# Patient Record
Sex: Female | Born: 1945
Health system: Southern US, Community
[De-identification: ages and names within clinical notes are randomized; demographics above are authoritative.]

## PROBLEM LIST (undated history)

## (undated) DIAGNOSIS — R42 Dizziness and giddiness: Secondary | ICD-10-CM

## (undated) DIAGNOSIS — G4730 Sleep apnea, unspecified: Secondary | ICD-10-CM

## (undated) DIAGNOSIS — Z9989 Dependence on other enabling machines and devices: Secondary | ICD-10-CM

## (undated) DIAGNOSIS — F419 Anxiety disorder, unspecified: Secondary | ICD-10-CM

## (undated) DIAGNOSIS — T7840XA Allergy, unspecified, initial encounter: Secondary | ICD-10-CM

## (undated) DIAGNOSIS — A6000 Herpesviral infection of urogenital system, unspecified: Secondary | ICD-10-CM

## (undated) DIAGNOSIS — N6009 Solitary cyst of unspecified breast: Secondary | ICD-10-CM

## (undated) DIAGNOSIS — R519 Headache, unspecified: Secondary | ICD-10-CM

## (undated) DIAGNOSIS — I10 Essential (primary) hypertension: Secondary | ICD-10-CM

## (undated) DIAGNOSIS — E785 Hyperlipidemia, unspecified: Secondary | ICD-10-CM

## (undated) DIAGNOSIS — C4491 Basal cell carcinoma of skin, unspecified: Secondary | ICD-10-CM

## (undated) DIAGNOSIS — K219 Gastro-esophageal reflux disease without esophagitis: Secondary | ICD-10-CM

## (undated) DIAGNOSIS — H269 Unspecified cataract: Secondary | ICD-10-CM

## (undated) DIAGNOSIS — G4733 Obstructive sleep apnea (adult) (pediatric): Secondary | ICD-10-CM

## (undated) DIAGNOSIS — N200 Calculus of kidney: Secondary | ICD-10-CM

## (undated) DIAGNOSIS — M1990 Unspecified osteoarthritis, unspecified site: Secondary | ICD-10-CM

## (undated) DIAGNOSIS — N393 Stress incontinence (female) (male): Secondary | ICD-10-CM

## (undated) HISTORY — DX: Obstructive sleep apnea (adult) (pediatric): G47.33

## (undated) HISTORY — DX: Dependence on other enabling machines and devices: Z99.89

## (undated) HISTORY — DX: Calculus of kidney: N20.0

## (undated) HISTORY — DX: Allergy, unspecified, initial encounter: T78.40XA

## (undated) HISTORY — DX: Essential (primary) hypertension: I10

## (undated) HISTORY — DX: Sleep apnea, unspecified: G47.30

## (undated) HISTORY — PX: OTHER SURGICAL HISTORY: SHX169

## (undated) HISTORY — PX: COLONOSCOPY: SHX174

## (undated) HISTORY — PX: WISDOM TOOTH EXTRACTION: SHX21

## (undated) HISTORY — DX: Hyperlipidemia, unspecified: E78.5

## (undated) HISTORY — DX: Stress incontinence (female) (male): N39.3

## (undated) HISTORY — DX: Unspecified cataract: H26.9

## (undated) HISTORY — DX: Basal cell carcinoma of skin, unspecified: C44.91

## (undated) HISTORY — DX: Solitary cyst of unspecified breast: N60.09

## (undated) HISTORY — DX: Anxiety disorder, unspecified: F41.9

## (undated) HISTORY — DX: Herpesviral infection of urogenital system, unspecified: A60.00

## (undated) HISTORY — DX: Unspecified osteoarthritis, unspecified site: M19.90

## (undated) HISTORY — PX: KNEE ARTHROSCOPY: SUR90

## (undated) HISTORY — DX: Dizziness and giddiness: R42

## (undated) HISTORY — PX: LITHOTRIPSY: SUR834

## (undated) HISTORY — PX: RECTOVAGINAL FISTULA CLOSURE: SUR265

## (undated) HISTORY — DX: Gastro-esophageal reflux disease without esophagitis: K21.9

## (undated) HISTORY — PX: ESOPHAGOGASTRODUODENOSCOPY: SHX1529

---

## 1984-12-27 HISTORY — PX: ABDOMINAL HYSTERECTOMY: SHX81

## 1999-05-03 ENCOUNTER — Emergency Department (HOSPITAL_COMMUNITY): Admission: EM | Admit: 1999-05-03 | Discharge: 1999-05-03 | Payer: Self-pay | Admitting: Emergency Medicine

## 1999-06-25 ENCOUNTER — Ambulatory Visit (HOSPITAL_COMMUNITY): Admission: RE | Admit: 1999-06-25 | Discharge: 1999-06-25 | Payer: Self-pay | Admitting: Cardiology

## 1999-07-31 ENCOUNTER — Encounter: Payer: Self-pay | Admitting: Emergency Medicine

## 1999-07-31 ENCOUNTER — Emergency Department (HOSPITAL_COMMUNITY): Admission: EM | Admit: 1999-07-31 | Discharge: 1999-07-31 | Payer: Self-pay | Admitting: Emergency Medicine

## 1999-08-17 ENCOUNTER — Emergency Department (HOSPITAL_COMMUNITY): Admission: EM | Admit: 1999-08-17 | Discharge: 1999-08-17 | Payer: Self-pay | Admitting: Emergency Medicine

## 2000-03-28 ENCOUNTER — Other Ambulatory Visit: Admission: RE | Admit: 2000-03-28 | Discharge: 2000-03-28 | Payer: Self-pay | Admitting: Obstetrics and Gynecology

## 2000-06-20 ENCOUNTER — Other Ambulatory Visit: Admission: RE | Admit: 2000-06-20 | Discharge: 2000-06-20 | Payer: Self-pay | Admitting: Gynecology

## 2000-07-18 ENCOUNTER — Ambulatory Visit (HOSPITAL_COMMUNITY): Admission: RE | Admit: 2000-07-18 | Discharge: 2000-07-18 | Payer: Self-pay | Admitting: Internal Medicine

## 2000-07-18 ENCOUNTER — Encounter (INDEPENDENT_AMBULATORY_CARE_PROVIDER_SITE_OTHER): Payer: Self-pay

## 2000-08-26 ENCOUNTER — Encounter: Payer: Self-pay | Admitting: Family Medicine

## 2000-08-26 ENCOUNTER — Emergency Department (HOSPITAL_COMMUNITY): Admission: EM | Admit: 2000-08-26 | Discharge: 2000-08-26 | Payer: Self-pay | Admitting: Emergency Medicine

## 2001-02-10 ENCOUNTER — Ambulatory Visit (HOSPITAL_COMMUNITY): Admission: RE | Admit: 2001-02-10 | Discharge: 2001-02-10 | Payer: Self-pay | Admitting: Orthopedic Surgery

## 2001-02-10 ENCOUNTER — Encounter: Payer: Self-pay | Admitting: Orthopedic Surgery

## 2001-03-22 ENCOUNTER — Encounter: Payer: Self-pay | Admitting: Cardiology

## 2001-03-22 ENCOUNTER — Ambulatory Visit (HOSPITAL_COMMUNITY): Admission: RE | Admit: 2001-03-22 | Discharge: 2001-03-22 | Payer: Self-pay | Admitting: Cardiology

## 2001-06-15 ENCOUNTER — Encounter: Payer: Self-pay | Admitting: Orthopedic Surgery

## 2001-06-15 ENCOUNTER — Ambulatory Visit (HOSPITAL_COMMUNITY): Admission: RE | Admit: 2001-06-15 | Discharge: 2001-06-15 | Payer: Self-pay | Admitting: Orthopedic Surgery

## 2001-10-31 ENCOUNTER — Other Ambulatory Visit: Admission: RE | Admit: 2001-10-31 | Discharge: 2001-10-31 | Payer: Self-pay | Admitting: Obstetrics and Gynecology

## 2001-11-02 ENCOUNTER — Emergency Department (HOSPITAL_COMMUNITY): Admission: EM | Admit: 2001-11-02 | Discharge: 2001-11-03 | Payer: Self-pay | Admitting: Emergency Medicine

## 2001-11-04 ENCOUNTER — Emergency Department (HOSPITAL_COMMUNITY): Admission: EM | Admit: 2001-11-04 | Discharge: 2001-11-04 | Payer: Self-pay | Admitting: Emergency Medicine

## 2001-11-04 ENCOUNTER — Encounter: Payer: Self-pay | Admitting: Emergency Medicine

## 2001-11-09 ENCOUNTER — Encounter: Admission: RE | Admit: 2001-11-09 | Discharge: 2002-01-02 | Payer: Self-pay | Admitting: Orthopedic Surgery

## 2001-12-27 HISTORY — PX: CHOLECYSTECTOMY: SHX55

## 2002-01-12 ENCOUNTER — Encounter: Admission: RE | Admit: 2002-01-12 | Discharge: 2002-01-30 | Payer: Self-pay | Admitting: Orthopedic Surgery

## 2002-01-24 ENCOUNTER — Encounter: Admission: RE | Admit: 2002-01-24 | Discharge: 2002-01-24 | Payer: Self-pay | Admitting: Family Medicine

## 2002-01-24 ENCOUNTER — Encounter: Payer: Self-pay | Admitting: Family Medicine

## 2002-05-29 ENCOUNTER — Encounter: Payer: Self-pay | Admitting: Emergency Medicine

## 2002-05-29 ENCOUNTER — Emergency Department (HOSPITAL_COMMUNITY): Admission: EM | Admit: 2002-05-29 | Discharge: 2002-05-29 | Payer: Self-pay

## 2002-08-02 ENCOUNTER — Encounter: Payer: Self-pay | Admitting: General Surgery

## 2002-08-02 ENCOUNTER — Observation Stay (HOSPITAL_COMMUNITY): Admission: RE | Admit: 2002-08-02 | Discharge: 2002-08-03 | Payer: Self-pay | Admitting: General Surgery

## 2002-08-02 ENCOUNTER — Encounter (INDEPENDENT_AMBULATORY_CARE_PROVIDER_SITE_OTHER): Payer: Self-pay | Admitting: Specialist

## 2002-11-07 ENCOUNTER — Other Ambulatory Visit: Admission: RE | Admit: 2002-11-07 | Discharge: 2002-11-07 | Payer: Self-pay | Admitting: Obstetrics and Gynecology

## 2002-11-08 ENCOUNTER — Encounter: Payer: Self-pay | Admitting: Internal Medicine

## 2002-12-01 ENCOUNTER — Emergency Department (HOSPITAL_COMMUNITY): Admission: EM | Admit: 2002-12-01 | Discharge: 2002-12-01 | Payer: Self-pay | Admitting: *Deleted

## 2002-12-10 ENCOUNTER — Encounter: Admission: RE | Admit: 2002-12-10 | Discharge: 2003-03-10 | Payer: Self-pay | Admitting: Family Medicine

## 2003-03-10 ENCOUNTER — Encounter: Payer: Self-pay | Admitting: Orthopedic Surgery

## 2003-03-10 ENCOUNTER — Ambulatory Visit (HOSPITAL_COMMUNITY): Admission: RE | Admit: 2003-03-10 | Discharge: 2003-03-10 | Payer: Self-pay | Admitting: Orthopedic Surgery

## 2003-05-14 ENCOUNTER — Encounter: Admission: RE | Admit: 2003-05-14 | Discharge: 2003-08-12 | Payer: Self-pay | Admitting: Family Medicine

## 2003-07-19 ENCOUNTER — Emergency Department (HOSPITAL_COMMUNITY): Admission: EM | Admit: 2003-07-19 | Discharge: 2003-07-19 | Payer: Self-pay

## 2003-10-18 ENCOUNTER — Ambulatory Visit (HOSPITAL_COMMUNITY): Admission: RE | Admit: 2003-10-18 | Discharge: 2003-10-18 | Payer: Self-pay | Admitting: Cardiology

## 2003-10-18 ENCOUNTER — Encounter: Payer: Self-pay | Admitting: Cardiology

## 2004-01-08 ENCOUNTER — Encounter: Admission: RE | Admit: 2004-01-08 | Discharge: 2004-04-07 | Payer: Self-pay | Admitting: Neurology

## 2004-12-25 ENCOUNTER — Ambulatory Visit: Payer: Self-pay | Admitting: Internal Medicine

## 2005-03-10 ENCOUNTER — Other Ambulatory Visit: Admission: RE | Admit: 2005-03-10 | Discharge: 2005-03-10 | Payer: Self-pay | Admitting: Addiction Medicine

## 2005-06-08 ENCOUNTER — Ambulatory Visit (HOSPITAL_BASED_OUTPATIENT_CLINIC_OR_DEPARTMENT_OTHER): Admission: RE | Admit: 2005-06-08 | Discharge: 2005-06-08 | Payer: Self-pay | Admitting: Family Medicine

## 2005-06-08 ENCOUNTER — Ambulatory Visit: Payer: Self-pay | Admitting: Internal Medicine

## 2006-02-24 ENCOUNTER — Encounter: Admission: RE | Admit: 2006-02-24 | Discharge: 2006-02-24 | Payer: Self-pay | Admitting: Orthopedic Surgery

## 2006-04-15 ENCOUNTER — Encounter: Admission: RE | Admit: 2006-04-15 | Discharge: 2006-05-16 | Payer: Self-pay | Admitting: Orthopedic Surgery

## 2006-05-17 ENCOUNTER — Encounter: Admission: RE | Admit: 2006-05-17 | Discharge: 2006-08-15 | Payer: Self-pay | Admitting: Orthopedic Surgery

## 2006-10-03 ENCOUNTER — Encounter: Admission: RE | Admit: 2006-10-03 | Discharge: 2006-11-02 | Payer: Self-pay | Admitting: Orthopedic Surgery

## 2006-12-05 ENCOUNTER — Encounter: Admission: RE | Admit: 2006-12-05 | Discharge: 2006-12-05 | Payer: Self-pay | Admitting: Orthopedic Surgery

## 2006-12-13 ENCOUNTER — Encounter: Admission: RE | Admit: 2006-12-13 | Discharge: 2006-12-13 | Payer: Self-pay | Admitting: Orthopedic Surgery

## 2007-01-11 ENCOUNTER — Encounter: Admission: RE | Admit: 2007-01-11 | Discharge: 2007-03-30 | Payer: Self-pay | Admitting: Orthopedic Surgery

## 2007-04-11 ENCOUNTER — Encounter (INDEPENDENT_AMBULATORY_CARE_PROVIDER_SITE_OTHER): Payer: Self-pay | Admitting: *Deleted

## 2007-04-11 ENCOUNTER — Emergency Department (HOSPITAL_COMMUNITY): Admission: EM | Admit: 2007-04-11 | Discharge: 2007-04-11 | Payer: Self-pay | Admitting: Emergency Medicine

## 2007-05-25 ENCOUNTER — Other Ambulatory Visit: Admission: RE | Admit: 2007-05-25 | Discharge: 2007-05-25 | Payer: Self-pay | Admitting: Obstetrics and Gynecology

## 2007-05-29 ENCOUNTER — Ambulatory Visit (HOSPITAL_COMMUNITY): Admission: RE | Admit: 2007-05-29 | Discharge: 2007-05-29 | Payer: Self-pay | Admitting: Urology

## 2007-05-29 ENCOUNTER — Encounter (INDEPENDENT_AMBULATORY_CARE_PROVIDER_SITE_OTHER): Payer: Self-pay | Admitting: *Deleted

## 2008-03-08 ENCOUNTER — Ambulatory Visit: Payer: Self-pay | Admitting: Internal Medicine

## 2008-03-15 ENCOUNTER — Encounter: Payer: Self-pay | Admitting: Internal Medicine

## 2008-03-15 ENCOUNTER — Ambulatory Visit: Payer: Self-pay | Admitting: Internal Medicine

## 2008-08-07 ENCOUNTER — Encounter: Payer: Self-pay | Admitting: Internal Medicine

## 2008-11-11 ENCOUNTER — Telehealth: Payer: Self-pay | Admitting: Internal Medicine

## 2008-12-25 DIAGNOSIS — F329 Major depressive disorder, single episode, unspecified: Secondary | ICD-10-CM | POA: Insufficient documentation

## 2008-12-25 DIAGNOSIS — K589 Irritable bowel syndrome without diarrhea: Secondary | ICD-10-CM | POA: Insufficient documentation

## 2008-12-25 DIAGNOSIS — K449 Diaphragmatic hernia without obstruction or gangrene: Secondary | ICD-10-CM | POA: Insufficient documentation

## 2008-12-25 DIAGNOSIS — K219 Gastro-esophageal reflux disease without esophagitis: Secondary | ICD-10-CM | POA: Insufficient documentation

## 2008-12-25 DIAGNOSIS — N824 Other female intestinal-genital tract fistulae: Secondary | ICD-10-CM | POA: Insufficient documentation

## 2008-12-25 DIAGNOSIS — K5730 Diverticulosis of large intestine without perforation or abscess without bleeding: Secondary | ICD-10-CM | POA: Insufficient documentation

## 2008-12-25 DIAGNOSIS — F411 Generalized anxiety disorder: Secondary | ICD-10-CM | POA: Insufficient documentation

## 2008-12-25 DIAGNOSIS — Z8679 Personal history of other diseases of the circulatory system: Secondary | ICD-10-CM | POA: Insufficient documentation

## 2008-12-25 DIAGNOSIS — E785 Hyperlipidemia, unspecified: Secondary | ICD-10-CM | POA: Insufficient documentation

## 2008-12-30 ENCOUNTER — Ambulatory Visit: Payer: Self-pay | Admitting: Internal Medicine

## 2009-06-09 ENCOUNTER — Other Ambulatory Visit: Admission: RE | Admit: 2009-06-09 | Discharge: 2009-06-09 | Payer: Self-pay | Admitting: Obstetrics and Gynecology

## 2009-06-09 ENCOUNTER — Encounter: Payer: Self-pay | Admitting: Obstetrics and Gynecology

## 2009-06-09 ENCOUNTER — Ambulatory Visit: Payer: Self-pay | Admitting: Obstetrics and Gynecology

## 2009-07-09 ENCOUNTER — Encounter: Payer: Self-pay | Admitting: Internal Medicine

## 2009-08-31 ENCOUNTER — Emergency Department (HOSPITAL_COMMUNITY): Admission: EM | Admit: 2009-08-31 | Discharge: 2009-08-31 | Payer: Self-pay | Admitting: Emergency Medicine

## 2009-09-04 ENCOUNTER — Telehealth: Payer: Self-pay | Admitting: Internal Medicine

## 2009-09-04 ENCOUNTER — Ambulatory Visit: Payer: Self-pay | Admitting: Internal Medicine

## 2009-09-04 DIAGNOSIS — R1032 Left lower quadrant pain: Secondary | ICD-10-CM | POA: Insufficient documentation

## 2009-09-04 DIAGNOSIS — A09 Infectious gastroenteritis and colitis, unspecified: Secondary | ICD-10-CM | POA: Insufficient documentation

## 2009-09-04 DIAGNOSIS — K921 Melena: Secondary | ICD-10-CM | POA: Insufficient documentation

## 2009-09-04 DIAGNOSIS — R195 Other fecal abnormalities: Secondary | ICD-10-CM | POA: Insufficient documentation

## 2009-09-05 LAB — CONVERTED CEMR LAB
BUN: 14 mg/dL (ref 6–23)
Calcium: 8.7 mg/dL (ref 8.4–10.5)
Chloride: 107 meq/L (ref 96–112)
Eosinophils Relative: 0.7 % (ref 0.0–5.0)
GFR calc non Af Amer: 92.96 mL/min (ref >60)
HCT: 40.6 % (ref 36.0–46.0)
Hemoglobin: 13.4 g/dL (ref 12.0–15.0)
Lymphocytes Relative: 17.8 % (ref 12.0–46.0)
MCV: 96.6 fL (ref 78.0–100.0)
Monocytes Absolute: 0.3 10*3/uL (ref 0.1–1.0)
Potassium: 3.8 meq/L (ref 3.5–5.1)

## 2009-09-08 ENCOUNTER — Encounter: Payer: Self-pay | Admitting: Physician Assistant

## 2009-09-17 ENCOUNTER — Ambulatory Visit: Payer: Self-pay | Admitting: Internal Medicine

## 2009-09-29 ENCOUNTER — Encounter: Payer: Self-pay | Admitting: Internal Medicine

## 2009-10-23 ENCOUNTER — Encounter: Payer: Self-pay | Admitting: Internal Medicine

## 2010-02-23 ENCOUNTER — Telehealth: Payer: Self-pay | Admitting: Internal Medicine

## 2010-02-23 ENCOUNTER — Ambulatory Visit: Payer: Self-pay | Admitting: Internal Medicine

## 2010-02-23 LAB — CONVERTED CEMR LAB
Basophils Relative: 0.4 % (ref 0.0–3.0)
Eosinophils Absolute: 0 10*3/uL (ref 0.0–0.7)
Hemoglobin: 13.4 g/dL (ref 12.0–15.0)
Lymphs Abs: 1.7 10*3/uL (ref 0.7–4.0)
MCHC: 33.2 g/dL (ref 30.0–36.0)
Neutro Abs: 6 10*3/uL (ref 1.4–7.7)
Neutrophils Relative %: 72.8 % (ref 43.0–77.0)
RBC: 4.21 M/uL (ref 3.87–5.11)
RDW: 13.4 % (ref 11.5–14.6)

## 2010-02-25 ENCOUNTER — Ambulatory Visit: Payer: Self-pay | Admitting: Internal Medicine

## 2010-03-30 ENCOUNTER — Encounter: Admission: RE | Admit: 2010-03-30 | Discharge: 2010-03-30 | Payer: Self-pay | Admitting: Orthopedic Surgery

## 2010-05-05 ENCOUNTER — Ambulatory Visit: Payer: Self-pay | Admitting: Obstetrics and Gynecology

## 2010-06-10 ENCOUNTER — Ambulatory Visit: Payer: Self-pay | Admitting: Obstetrics and Gynecology

## 2010-06-23 ENCOUNTER — Telehealth: Payer: Self-pay | Admitting: Internal Medicine

## 2010-06-24 ENCOUNTER — Telehealth: Payer: Self-pay | Admitting: Internal Medicine

## 2010-09-07 ENCOUNTER — Telehealth: Payer: Self-pay | Admitting: Internal Medicine

## 2010-11-26 ENCOUNTER — Emergency Department (HOSPITAL_COMMUNITY)
Admission: EM | Admit: 2010-11-26 | Discharge: 2010-11-27 | Payer: Self-pay | Source: Home / Self Care | Admitting: Emergency Medicine

## 2010-12-03 ENCOUNTER — Ambulatory Visit: Payer: Self-pay | Admitting: Obstetrics and Gynecology

## 2010-12-25 ENCOUNTER — Encounter: Payer: Self-pay | Admitting: Internal Medicine

## 2011-01-26 NOTE — Progress Notes (Signed)
Summary: Triage  Phone Note Call from Patient Call back at Home Phone [phone]   Caller: Patient Call For: Dr. Juanda Chance Reason for Call: Talk to Nurse Summary of Call: Upper chest feels sore, burping and acid reflux x 2 days Initial call taken by: Karna Christmas,  September 07, 2010 9:48 AM  Follow-up for Phone Call        Reviewed with the patient these are the same symptoms she called about in June.  Chest pressure, belching, and reflux.   At that time her omeprazole was increased to two times a day and she was also put on Carafate slurrry.  I have advised her to increase her omeparazole to two times a day for 10 days and called her in a refill of her carafate slurry.  She will call back if her symtoms persist after she has completed the 10 day course. Follow-up by: Darcey Nora RN, CGRN,  September 07, 2010 10:37 AM  Additional Follow-up for Phone Call Additional follow up Details #1::        reviewed and agree. Additional Follow-up by: Hart Carwin MD,  September 07, 2010 1:37 PM    Prescriptions: CARAFATE 1 GM/10ML  SUSP (SUCRALFATE) Take 10cc by mouth twice daily for 10 days.  #10 oz x 1   Entered by:   Darcey Nora RN, CGRN   Authorized by:   Hart Carwin MD   Signed by:   Darcey Nora RN, CGRN on 09/07/2010   Method used:   [facility]  [address] [phone][facility] (retail)       [address]       [place], [place]  [address]       Ph: [phone] or [phone]       Fax: [phone]   RxID:   [id number]

## 2011-01-26 NOTE — Progress Notes (Signed)
Summary: TRIAGE  Phone Note Call from Patient Call back at Home Phone [phone]   Call For: Dr Juanda Chance Reason for Call: Talk to Nurse Summary of Call: Alot of burping and pain in her chest area down to her stomach. Initial call taken by: Leanor Kail [facility],  June 23, 2010 9:48 AM  Follow-up for Phone Call        Last OV 02-25-10.  Pt. c/o 1 week of "A full feeling in my rib cage at the top of my stomache." Also has increased burping and feels like small amt. of food may come back when she burps. Denies n/v, fever, blood, black stools. Takes Omeprazole 20mg  QAM.   1) Continue Omeprazole QAM 2) Gas-x,Phazyme, etc. QID for 5 days, then as needed for gas & bloating. 3) Clear liquids x24 hours. Advance to soft,bland diet x2-4 days. Advanced as tolerated. 4) If symptoms become worse call back immediately.    Follow-up by: Laureen Ochs LPN,  June 23, 2010 11:47 AM  Additional Follow-up for Phone Call Additional follow up Details #1::        Please increase Omeprazole to 20 mg by mouth two times a day x 10 days and add carafate slurry 10cc by mouth two times a day x 10 days, #12 oz, 1 refill. Additional Follow-up by: Hart Carwin MD,  June 23, 2010 2:46 PM    Additional Follow-up for Phone Call Additional follow up Details #2::    Message left for pt. with above MD instructions. Pt. instructed to call back as needed.  Follow-up by: Laureen Ochs LPN,  June 23, 2010 3:48 PM  New/Updated Medications: CARAFATE 1 GM/10ML  SUSP (SUCRALFATE) Take 10cc by mouth twice daily for 10 days. Prescriptions: CARAFATE 1 GM/10ML  SUSP (SUCRALFATE) Take 10cc by mouth twice daily for 10 days.  #12oz. x 1   Entered by:   Laureen Ochs LPN   Authorized by:   Hart Carwin MD   Signed by:   Laureen Ochs LPN on 78/46/9629   Method used:   [facility]        [facility]  [address] [phone][facility] (retail)       [address]       [place], [place]  [id number]  Ph: [phone] or [phone]       Fax: [phone]   RxID:   [id number]

## 2011-01-26 NOTE — Progress Notes (Signed)
Summary: TRIAGE  Phone Note Call from Patient Call back at Home Phone [phone]   Caller: Patient Call For: Juanda Chance Reason for Call: Talk to Nurse Summary of Call: Patient would like to be seen sooner than 3-31 do to blood in stools, dark stools and feels dizzy every morning. Initial call taken by: Tawni Levy,  February 23, 2010 9:57 AM  Follow-up for Phone Call        Pt. c/o intermittent BRB on tissue after a BM, pt. goes between constipation and diarrhea. Pt. states she has intermittent black stools for 1 week.  Pt. c/o dizziness/fatigue for 1 or more weeks. Pt. wants to be seen ASAP.  1) CBCD today 2) See Dr.Delshon Blanchfield on 02-25-10 at 1:15pm 3) If symptoms become worse call back immediately or go to ER.  Follow-up by: Laureen Ochs LPN,  February 23, 2010 10:44 AM  Additional Follow-up for Phone Call Additional follow up Details #1::        I can see her today, now. DB Additional Follow-up by: Hart Carwin MD,  February 23, 2010 1:23 PM    Additional Follow-up for Phone Call Additional follow up Details #2::    Dr.Jared Cahn is aware her schedule is full for today, it is O.K. for pt. to keep appt. 02-25-10. Follow-up by: Laureen Ochs LPN,  February 23, 2010 1:43 PM

## 2011-01-26 NOTE — Progress Notes (Signed)
Summary: meds too expensive  Phone Note Call from Patient Call back at Home Phone [phone] Call back at [phone]   Caller: Patient Call For: Juanda Chance Reason for Call: Talk to Nurse Summary of Call: Patient states that meds called in yesteday is too expensive, wants to know if theres anything similar but cheaper. Initial call taken by: Tawni Levy,  June 24, 2010 9:59 AM  Follow-up for Phone Call        There is no generic equal to Carafate slurry, I gave  new script to the pharmacy for Carafate  two times a day X10days, she can get the generic for that and it will be much less expensive. Pt. notified. Pt. instructed to call back as needed.  Follow-up by: Laureen Ochs LPN,  June 24, 2010 10:31 AM  Additional Follow-up for Phone Call Additional follow up Details #1::        I agree. Additional Follow-up by: Hart Carwin MD,  June 24, 2010 10:51 PM

## 2011-01-26 NOTE — Assessment & Plan Note (Signed)
Summary: RECTAL BLEEDING,FATIGUE FOR 1 WEEK             Kristen Mahoney   History of Present Illness Visit Type: Follow-up Visit Primary GI MD: Lina Sar MD Primary Provider: Dr Sharyn Lull Chief Complaint: Patient c/o 1 week llq abdominal pain as well as several episodes of dark black stools and brbpr. She states that she has had alternating constipation and diarrhea. She denies any nausea, vomiting or noticable fever. History of Present Illness:   This is a 65 year old, African American female with irritable bowel syndrome and progressive fecal incontinence which was evaluated by Dr. Pollyann Kennedy in [place].. She is status post surgical repair of a rectovaginal fistula at [facility] in 2001. She has a mucosal prolapse and ectropion. Dr. Pollyann Kennedy suggested an overlapping sphincteroplasty but because of the complexity of the operation and prolonged recovery, the patient decided to instead go with biofeedback and conservative measures. She improved on a high-fiber diet and Imodium. Her last colonoscopy was in March 2009. There is a family history of colon cancer in a parent and a family history of colon polyps. Her next colonoscopy will be due in March 2014.   GI Review of Systems    Reports abdominal pain.     Location of  Abdominal pain: LLQ.    Denies acid reflux, belching, bloating, chest pain, dysphagia with liquids, dysphagia with solids, heartburn, loss of appetite, nausea, vomiting, vomiting blood, weight loss, and  weight gain.      Reports black tarry stools, change in bowel habits, constipation, diarrhea, diverticulosis, irritable bowel syndrome, rectal bleeding, and  rectal pain.     Denies anal fissure, fecal incontinence, heme positive stool, hemorrhoids, jaundice, light color stool, and  liver problems.    Current Medications (verified): 1)  Levsin/sl 0.125 Mg Subl (Hyoscyamine Sulfate) .... Place 1 Tablet Under Tongue and Let Dissolve Every 6 Hours As Needed 2)  Omeprazole 20 Mg  Cpdr (Omeprazole) .... Take 1 Tab 30 Min Prior To Breakfast 3)  Aspirin Adult Low Strength 81 Mg Tbec (Aspirin) .... Take 1 Tab Daily 4)  Atenolol 25 Mg Tabs (Atenolol) .... 1/2 By Mouth Qam 5)  Lexapro 10 Mg Tabs (Escitalopram Oxalate) .... Take 2  Tab Daily 6)  Metformin Hcl 500 Mg Tabs (Metformin Hcl) .... Take 1 Tab Daily 7)  Lipitor 10 Mg Tabs (Atorvastatin Calcium) .... Take 1 Tab Daily 8)  Boric Acid 600 Capsule .... Insert 1 Capsule Into Vagina Every Other Day 9)  Centrum Silver  Tabs (Multiple Vitamins-Minerals) .... Take 1 Tab Daily 10)  Caltrate 600 1500 Mg Tabs (Calcium Carbonate) .... Take 1 Tab Daily 11)  Estradiol 0.0375 Mg/24hr Ptwk (Estradiol) .... Take 1 Tab Daily  Allergies (verified): 1)  ! Cipro  Past History:  Past Medical History: Reviewed history from 12/25/2008 and no changes required. Current Problems:  HYPERLIPIDEMIA (ICD-272.4) MITRAL VALVE PROLAPSE, HX OF (ICD-V12.50) HIATAL HERNIA (ICD-553.3) GERD (ICD-530.81) DEPRESSION (ICD-311) ANXIETY (ICD-300.00) Hx of RECTOVAGINAL FISTULA (ICD-619.1) DIVERTICULOSIS OF COLON (ICD-562.10) Family Hx of CARCINOMA, COLON (ICD-153.9) IRRITABLE BOWEL SYNDROME (ICD-564.1)  Past Surgical History: cholecystectomy breast surgery-cyst removed-right partial hysterectomy Knee Arthroscopy-right ear cystectomy multiple procedures for infertility  Family History: Reviewed history from 12/25/2008 and no changes required. Family History of Breast Cancer: Aunt Family History of Prostate Cancer: Father Family History of Diabetes: Mother, Father, Sisers, Brother Family History of Colon Cancer: Father Family History of Colon Polyps: Brother, Sister Family History of Heart Disease: Brother  Social History: Illicit Drug Use -  no Alcohol Use - yes-occasional  Occupation: Retired Runner, broadcasting/film/video Patient is a former smoker. -stopped 30 years ago Daily Caffeine Use-intermittent  Review of Systems       The patient complains of  allergy/sinus, arthritis/joint pain, back pain, breast changes/lumps, change in vision, cough, muscle pains/cramps, night sweats, sleeping problems, thirst - excessive, and urination - excessive.  The patient denies anemia, anxiety-new, blood in urine, confusion, coughing up blood, depression-new, fainting, fatigue, fever, headaches-new, hearing problems, heart murmur, heart rhythm changes, itching, menstrual pain, nosebleeds, pregnancy symptoms, shortness of breath, skin rash, sore throat, swelling of feet/legs, swollen lymph glands, thirst - excessive , urination - excessive , urination changes/pain, urine leakage, vision changes, and voice change.         Pertinent positive and negative review of systems were noted in the above HPI. All other ROS was otherwise negative.   Vital Signs:  Patient profile:   66 year old female Height:      62 inches Weight:      159.25 pounds BMI:     29.23 BSA:     1.74 Pulse rate:   84 / minute Pulse rhythm:   regular BP sitting:   102 / 60  (right arm)  Vitals Entered By: Hortense Ramal CMA Duncan Dull) (February 25, 2010 1:21 PM)  Physical Exam  General:  Well developed, well nourished, no acute distress. Ears:  increased cerumen in the right ear, blurred tympanic membrane on the left. Neck:  Supple; no masses or thyromegaly. Lungs:  Clear throughout to auscultation. Heart:  Regular rate and rhythm; no murmurs, rubs,  or bruits. Abdomen:  protuberance of the abdomen with tenderness in left lower quadrant. Rectal:  decreased rectal tone. No prolapse. No tenderness. Stool is Hemoccult negative. Very slight squeeze. Extremities:  No clubbing, cyanosis, edema or deformities noted. Psych:  Alert and cooperative. Normal mood and affect.   Impression & Recommendations:  Problem # 1:  IRRITABLE BOWEL SYNDROME (ICD-564.1) Patient has irritable bowel syndrome with predominant diarrhea and rectal incompetence causing fecal incontinence which has been recently improved  on a high-fiber diet and antispasmodic. She will continue the same regimen. She is not planning on any surgical intervention at this time.  Problem # 2:  FAMILY HX COLON CANCER (ICD-V16.0) up to date on her colonoscopies.  Problem # 3:  ANXIETY (ICD-300.00) her anxiety has negatively impacted her irritable bowel syndrome and incontinence. She is to continue on Lexapro 20 mg daily.  Problem # 4:  GERD (ICD-530.81) Controlled on omeprazole 20 mg daily.  Patient Instructions: 1)  Levsin sublingual 0.125 mg p.r.n. 2)  Probiotic daily. Samples given. 3)  High fiber die.  4)  Benefiber supplements one tablespoon daily. 5)  Recall colonoscopy in March 2014. 6)  Copy sent to : Dr Sharyn Lull 7)  The medication list was reviewed and reconciled.  All changed / newly prescribed medications were explained.  A complete medication list was provided to the patient / caregiver.

## 2011-01-28 ENCOUNTER — Telehealth: Payer: Self-pay | Admitting: Internal Medicine

## 2011-01-28 NOTE — Medication Information (Signed)
Summary: Approved/medco  Approved/medco   Imported By: Lester [place] 12/31/2010 09:44:17  _____________________________________________________________________  External Attachment:    Type:   Image     Comment:   External Document

## 2011-02-03 NOTE — Progress Notes (Signed)
Summary: [place]  Phone Note Call from Patient Call back at Home Phone [phone] Call back at [phone]   Caller: Patient Call For: Dr. Juanda Chance Reason for Call: Talk to Nurse Summary of Call: Pt is having a lot of problems with her bowels and wants to speak with anurse Initial call taken by: [place] Johnson,  January 28, 2011 12:42 PM  Follow-up for Phone Call        Patient has been on a vegetable and fruit diet for one month. She states she is doing this for a "church related fasting and wieght loss." For the last couple of weeks, she has had occassional hard, pellet like stool. She states alot of the time she doesn't know she has the pellet stool until she goes to the bathroom and hears it plop into the water. She states she does have "regular" bowel movements too. Stool pellets are dark brown/black in color. States her rectum feels tight. Denies any bleeding. Patient states she will be coming off the diet on Sunday. Patient does state she did not notice this change in stool until she started on this diet. Discussed with patient that Dr. Juanda Chance had recommended she have a high fiber diet and that this diet change is probably causing her problem Follow-up by: Jesse Fall RN,  January 28, 2011 1:41 PM  Additional Follow-up for Phone Call Additional follow up Details #1::        I agree. She may add prunes to her fruit diet. Additional Follow-up by: Hart Carwin MD,  January 29, 2011 8:32 AM    Additional Follow-up for Phone Call Additional follow up Details #2::    Patient given Dr. Regino Schultze recommendation. Follow-up by: Jesse Fall RN,  January 29, 2011 9:01 AM

## 2011-02-24 ENCOUNTER — Other Ambulatory Visit: Payer: Self-pay | Admitting: Dermatology

## 2011-03-08 LAB — POCT I-STAT, CHEM 8
Creatinine, Ser: 1.2 mg/dL (ref 0.4–1.2)
HCT: 42 % (ref 36.0–46.0)
Potassium: 3.6 mEq/L (ref 3.5–5.1)
TCO2: 23 mmol/L (ref 0–100)

## 2011-03-08 LAB — URINALYSIS, ROUTINE W REFLEX MICROSCOPIC
Ketones, ur: NEGATIVE mg/dL
Nitrite: NEGATIVE
Protein, ur: 30 mg/dL — AB
Specific Gravity, Urine: 1.03 (ref 1.005–1.030)

## 2011-03-08 LAB — URINE MICROSCOPIC-ADD ON

## 2011-03-15 ENCOUNTER — Telehealth: Payer: Self-pay | Admitting: Internal Medicine

## 2011-03-16 ENCOUNTER — Other Ambulatory Visit: Payer: Self-pay | Admitting: Internal Medicine

## 2011-03-16 MED ORDER — HYOSCYAMINE SULFATE 0.125 MG SL SUBL: 0.1250 mg | SUBLINGUAL_TABLET | Freq: Four times a day (QID) | SUBLINGUAL | Status: DC | PRN

## 2011-03-16 NOTE — Telephone Encounter (Signed)
Patient requesting refill on Levsin SL for abdominal cramping related to IBS. Patient states that she is only having occasional abdominal cramping with no change in bowels, rectal bleeding or other GI symptoms. Will send refill for Levsin SL (see EMR office note dated 02-26-11)

## 2011-03-25 NOTE — Progress Notes (Signed)
Summary: Medication refill  Phone Note Call from Patient Call back at Home Phone [phone]   Caller: Patient Call For: Dr. Juanda Chance Reason for Call: Refill Medication Summary of Call: Needs a refill on her Carafate...[facility] [address] Initial call taken by: Karna Christmas,  March 15, 2011 11:10 AM  Follow-up for Phone Call        Dr Juanda Chance,  Would you like me to continue giving patient carafate slurry? Looks like she was on it at one time x 10 days only Follow-up by: Lamona Curl CMA Duncan Dull),  March 15, 2011 12:16 PM  Additional Follow-up for Phone Call Additional follow up Details #1::        OK to refill x 1. Additional Follow-up by: Hart Carwin MD,  March 15, 2011 6:04 PM    New/Updated Medications: CARAFATE 1 GM/10ML  SUSP (SUCRALFATE) Take 10cc by mouth twice daily for 10 days. Prescriptions: CARAFATE 1 GM/10ML  SUSP (SUCRALFATE) Take 10cc by mouth twice daily for 10 days.  #10 oz x 0   Entered by:   Lamona Curl CMA (AAMA)   Authorized by:   Hart Carwin MD   Signed by:   Lamona Curl CMA (AAMA) on 03/15/2011   Method used:   [facility]        [facility]  [address] [phone]* (retail)       [address]       [place], [place]  [id number]       Ph: [phone] or [phone]       Fax: [phone]   RxID:   [id number]

## 2011-05-11 NOTE — Assessment & Plan Note (Signed)
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  She has had numerous colonoscopies, starting in 1987,  last one in August 2004.  She will be due for colonoscopy this summer.  She has noticed rectal bleeding, associated with increased diarrhea.  Her bowel habits over the years have been predominantly diarrhea.  At  some point, it was thought she had Crohn's disease of the rectum, but  the parameters are more in favor of irritable bowel syndrome.   MEDICATIONS:  1. Atenolol 25 mg p.o. daily.  2. Omeprazole 20 mg p.o. daily.  3. Lexapro 10 mg daily.  4. Acyclovir 400 mg daily.  5. Metformin 500 mg daily.  6. Lipitor 10 mg p.o. daily.  7. Boric acid.  8. Multiple vitamins.  9. Caltrate.  10.Hyoscyamine 0.125 mg p.r.n.  11.Imodium p.r.n.   PHYSICAL EXAM:  Blood pressure 112/62, pulse 68 and weight 157 pounds.  She was somewhat anxious, alert and oriented.  LUNGS:  Clear to auscultation.  COR:  With normal S1, normal S2.  ABDOMEN:  Protuberant.  She has gained about 20 pounds since I have seen  her last time.  She had normoactive bowel sounds, but diffuse  tenderness, more so in left lower, left middle and left upper quadrants,  also in the epigastrium, along the transverse colon.  There was no  rebound, no fluid wave.  RECTAL AND ANOSCOPIC EXAM:  Reveals partial prolapse of the rectal  ampulla into the anal canal.  The prolapse was not complete, but you  could see about cherry-sized, reddish mucosa, which was somewhat  hyperemic and showed some stigmata of bleeding.   It was soft and easily  reducible with anoscope.  Stool was Hemoccult-positive.  There were no  hemorrhoids and no visible fistula.  Rectal tone was markedly decreased.   IMPRESSION:  1. Patient is a 65 year old African-American female with fecal      incontinence, due to prior repair of rectovaginal fistula and      rectal insufficiency.  Rectal bleeding is now precipitated by      partial prolapse of the perirectal mucosa.  Her stool is Hemoccult-      positive.  2. IBS/diarrhea, currently with exacerbation.  3. Abdominal pain, left side of the colon.  Rule out colitis,      diverticulitis, rule out spastic colon.  4. Positive family history of colon cancer in a parent.  Patient due      for colonoscopy.   PLAN:  1. Continue hyoscyamine, as prescribed.  2. Add Keflex 250 p.o. q.i.d. for one week.  She is ALLERGIC TO CIPRO.  3. Anusol-HC suppositories to use on a daily basis.  4. Colonoscopy scheduled  with appropriate biopsies.  5. Imodium one q.a.m. and she may take another one p.r.n. in the      evening.     Hedwig Morton. Juanda Chance, MD  Electronically Signed    DMB/MedQ  DD: 03/08/2008  DT: 03/08/2008  Job #: [id number]   cc:   Lorelle Formosa, M.D.

## 2011-05-14 NOTE — Procedures (Signed)
NAME:  Kristen Mahoney, Kristen Mahoney                   ACCOUNT NO.:  [id number]   MEDICAL RECORD NO.:  [id number]          PATIENT TYPE:  OUT   LOCATION:  SLEEP CENTER                 FACILITY[facility]  [facility]   PHYSICIAN:  Clinton D. Maple Hudson, M.D. DATE OF BIRTH:  19-Jan-1946   DATE OF STUDY:  06/08/2005                              NOCTURNAL POLYSOMNOGRAM   REFERRING PHYSICIAN:  Lorelle Formosa, M.D.   INDICATION FOR STUDY:  Hypersomnia with sleep apnea.   Epworth sleepiness score 9/24, BMI 25, weight 140 pounds.   SLEEP ARCHITECTURE:  Total sleep time 359 minutes, sleep efficiency 82%.  Stage I 21%, stage II 59%, stages III and IV absent, REM 20% of total sleep  time.  Sleep latency 30 minutes.  REM latency 124 minutes.  Awake after  sleep onset 51 minutes, arousal index 34. She took all of her scheduled  medications before arrival in sleep center.   RESPIRATORY DATA:  Respiratory disturbance index (RDI, AHI) 31.9 obstructive  events per hour indicating moderate obstructive sleep apnea/hypopnea  syndrome.  There was 1 central apnea, 59 obstructive apneas, 4 mixed apneas,  and 127 hypopneas.  Most events and sleep were while supine, but events were  recorded in other sleep positions as well.  REM RDI 6.6.  Most events were  recorded after 2 a.m., leaving insufficient time after meeting diagnostic  standards for CPAP titration by split study protocol.   OXYGEN DATA:  Moderate snoring with oxygen desaturation to a nadir of 85%.  Mean oxygen saturation through the study was 95% on room air.   CARDIAC DATA:  Normal cardiac rhythm.   MOVEMENT/PARASOMNIA:  A total of 98 limb jerks were recorded of which 11  were associated with arousal or awakening for a periodic limb movement with  arousal index of 1.8 per hour which is probably insignificant.   IMPRESSION/RECOMMENDATION:  1.  Moderate obstructive sleep apnea/hypopnea syndrome, RDI 31.9 per hour      with moderate snoring and oxygen desaturation to 86%.  2.   Consider return for CPAP titration bring sleep medication if      appropriate.  Otherwise evaluate for alternative therapies.      Clinton D. Maple Hudson, M.D.  Diplomat    CDY/MEDQ  D:  06/13/2005 10:38:51  T:  06/14/2005 15:13:22  Job:  [id number]   cc:   Lorelle Formosa, M.D.  [phone] [address]  [place]  [place] [address]  Fax: [phone]

## 2011-05-14 NOTE — Op Note (Signed)
[name], [name]                            ACCOUNT NO.:  [id number]   MEDICAL RECORD NO.:  [id number]                   PATIENT TYPE:  OBV   LOCATION:  0450                                 FACILITY:  [facility]   PHYSICIAN:  Timothy E. Earlene Plater, M.D.              DATE OF BIRTH:  [birth year]   DATE OF PROCEDURE:  08/02/2002  DATE OF DISCHARGE:  08/03/2002                                 OPERATIVE REPORT   PREOPERATIVE DIAGNOSES:  1. Biliary dyskinesia.  2. Gallbladder polyps.  3. Irritable bowel syndrome.   POSTOPERATIVE DIAGNOSES:  1. Biliary dyskinesia.  2. Gallbladder polyps.  3. Irritable bowel syndrome.   PROCEDURE:  Laparoscopic cholecystectomy and operative cholangiogram.   SURGEON:  Timothy E. Earlene Plater, M.D.   ASSISTANT:  Currie Paris, M.D.   ANESTHESIA:  CRNA supervised M.D.   INDICATIONS FOR PROCEDURE:  The patient is 86, has persistent postprandial  right upper quadrant pain, nausea.  Laboratory data include an abnormal  hepatobiliary scan with ejection fraction and ultrasound which are enclosed.  In spite of the fact that she has moderate to severe diarrhea-predominant  IBD and anorectal incontinence secondary to fistula, she wishes to proceed  and has been completely informed, counseled by myself and Dr. Juanda Chance.  She  is prepared for the surgery today.   The patient was evaluated by anesthesia, identified in the permanent sign.   DESCRIPTION OF PROCEDURE:  She was taken to the operating room, placed  supine, endotracheal anesthesia provided.  The abdomen was scrubbed,  prepped, and draped in the usual fashion.  Marcaine 0.25% with epinephrine  was used throughout prior to each incision.  An infraumbilical incision  made, the fascia identified, opened vertically, the peritoneum entered  without complication.  Hasson catheter placed, tied in placed, tied in place  with a #1 Vicryl.  The  abdomen insufflated.  General peritoneoscopy was  carried out showing a  normal-appearing gallbladder and normal pelvis.  There  were adhesions in the left abdomen from the left upper quadrant to the left  lower quadrant.  These appeared innocent.  Attention was turned to the  gallbladder.  A second 10 mm trocar placed in the mid epigastrium and two 5  mm trocars in the right upper quadrant.  The gallbladder was grasped and  placed on tension.  Careful dissection at the base of the gallbladder  revealed a normal-appearing cystic duct behind which was a cystic artery.  A  window was created showing normal structures.  The artery was doubly  clipped.  The cystic duct was clipped near the gallbladder, small incision  made in the cystic duct and the cholangiogram catheter introduced through  the abdominal wall into the cystic duct remnant, clipped, and then using  real-time fluoroscopy and had insertion of dye, a cholangiogram was carried  out showing smooth and rapid filling of the cystic duct, common bile duct,  duodenum, extending to the common hepatic, and hepatic radicles.  All seemed  normal.  Catheter was removed and the cystic duct remnant triply clipped,  fully divided.  Additional clips were placed on the cystic artery, and it  was divided, and the gallbladder was removed from the gallbladder bed.  One  small nick was made in the gallbladder, and there was some bile leak but no  stone spillage.  The gallbladder was removed from the gallbladder bed  without incident or complication.  The gallbladder bed carefully cauterized  and was dry.  The gallbladder was placed in an EndoCatch bag.  And copious  irrigation was carried out until completely clear, including the remaining  in the abdomen.  The gallbladder was removed through the infraumbilical  incision which was then tied under direct vision with a #1 Vicryl.  Further  irrigation carried out, and it was clear.  And then all irrigant, CO2,  instruments, and trocars removed under direct vision.  There were  no  complications.  The counts were correct.  The skin incisions were closed  with 3-0 Monocryl, Steri-Strips, and dry sterile dressings.                                               Timothy E. Earlene Plater, M.D.    TED/MEDQ  D:  08/02/2002  T:  08/05/2002  Job:  [id number]   cc:   Hedwig Morton. Juanda Chance, M.D. [facility]

## 2011-05-14 NOTE — Procedures (Signed)
[facility]  Patient:    Kristen Mahoney, Kristen Mahoney                          MRN: [id number] Proc. Date: 07/18/00 Adm. Date:  [id number] Attending:  Mervin Hack CC:         Rande Brunt. Eda Paschal, M.D.                           Procedure Report  PROCEDURE:  Upper endoscopy and colonoscopy.  ENDOSCOPIST:  Everardo Beals Juanda Chance, M.D.  INDICATIONS:  The patient is a 65 year old white female who has had chronic intermittent diarrhea. She also has a positive family history for colon cancer. She has a history of rectovaginal fistula and has been evaluated in the past for possible Crohns disease, but there was never any endoscopic evidence of it. Her inflammatory bowel disease autoimmune markers have been negative. She has recently had recurrence of left lower quadrant abdominal pain, increasing diarrhea in association with increased stress at work. She also complains of epigastric pain and increasing symptoms of gastroesophageal reflux disease. She has been scheduled now for upper endoscopy for evaluation of her upper GI symptoms and full colonoscopy to rule out inflammatory bowel disease in association with her rectovaginal fistula.  ENDOSCOPE:  Olympus single channel endoscope.  SEDATION:  Versed 10 mg IV, Demerol 50 mg IV.  FINDINGS:  The Olympus single-channel video endoscope was passed under direct vision into the posterior pharynx into the esophagus. The patient was monitored by pulse oximetry. Oxygen saturations were between 95 and 98%. Proximal and distal esophageal mucosa was unremarkable with normal appearing squamocolumnar junction. There was no significant hiatal hernia.  Stomach was insufflated with air. The body of the stomach was normal with normal appearing ruga pattern. There were patches of erythema in the gastric antrum without any significant erosions or mucosal lesions. Biopsies were taken from the gastric antrum for CLOtest. Pyloric outlet itself  appeared normal. Retroflexion of the endoscope revealed normal fundus and cardia.  Duodenum, duodenal bulb, and descending duodenum was unremarkable. The endoscope was then retracted and stomach decompressed. The patient tolerated the procedure well.  IMPRESSION: 1. Mild antral gastritis, status post CLOtest.  COLONOSCOPY:  ENDOSCOPE:  Olympus single-channel colonoscope.  SEDATION:  No additional sedation.  FINDINGS:  The Olympus single-channel colonoscope was passed under direct vision into the rectum to the sigmoid colon. The patient was again monitored by pulse oximetry. Her oxygen saturations remained stable. Her prep was excellent. In the anal canal, there was irregularity over the mucosa in the anal canal with pseudopolyp formation and an obvious fistulous tract within the anal canal which could not be probed with the endoscope because of the small size of it. The diameter of it was about 3-4 mm. Multiple biopsies were taken from this area which appeared rather fibrotic, but there were no active ulcerations or any bleeding, except after the biopsies were all obtained. No purulent material exuding from the fistula itself. The rectal ampulla was normal. Multiple biopsies were taken from the left colon. The patient had somewhat hypertrophied holster folds, but no evidence of colitis. The splenic flexure, transverse colon, and hepatic flexure was unremarkable. The colonoscope passed easily through the right colon to the cecum. The cecal pouch was normal with normal appearing ileocecal valve. The terminal ileum was entered only briefly and showed normal appearing mucosa. There was no evidence of Crohns disease. Colonoscope  was then retracted. The colon was decompressed. The patient tolerated the procedure well.  IMPRESSION: 1. Rectal vaginal fistula, status post biopsies. 2. No evidence of active Crohns disease. 3. Status post random biopsies of the left colon.  PLAN: I feel  that the patients rectovaginal fistula is not related to Crohns disease. We have not been able to detect any evidence of inflammatory bowel disease over the years. I recommended that the patient have the rectovaginal fistula repaired by Rande Brunt. Gottsegen, M.D. who has evaluated the fistula. She will also be treated for irritable bowel syndrome with antispasmodic Levbid 0.375 mg p.o. b.i.d. and continue on the acid suppression agent, Protonix 40 mg q.d. DD:  07/18/00 TD:  07/19/00 Job: [id number]

## 2011-06-14 ENCOUNTER — Encounter: Payer: Self-pay | Admitting: Obstetrics and Gynecology

## 2011-06-21 ENCOUNTER — Encounter (INDEPENDENT_AMBULATORY_CARE_PROVIDER_SITE_OTHER): Payer: Medicare Other | Admitting: Obstetrics and Gynecology

## 2011-06-21 ENCOUNTER — Other Ambulatory Visit (HOSPITAL_COMMUNITY)
Admission: RE | Admit: 2011-06-21 | Discharge: 2011-06-21 | Disposition: A | Discharge status: Home / Self Care | Payer: Medicare Other | Source: Ambulatory Visit | Attending: Obstetrics and Gynecology | Admitting: Obstetrics and Gynecology

## 2011-06-21 ENCOUNTER — Other Ambulatory Visit: Payer: Self-pay | Admitting: Obstetrics and Gynecology

## 2011-06-21 DIAGNOSIS — B373 Candidiasis of vulva and vagina: Secondary | ICD-10-CM

## 2011-06-21 DIAGNOSIS — B3731 Acute candidiasis of vulva and vagina: Secondary | ICD-10-CM

## 2011-06-21 DIAGNOSIS — N951 Menopausal and female climacteric states: Secondary | ICD-10-CM

## 2011-06-21 DIAGNOSIS — A6004 Herpesviral vulvovaginitis: Secondary | ICD-10-CM

## 2011-06-21 DIAGNOSIS — N6009 Solitary cyst of unspecified breast: Secondary | ICD-10-CM

## 2011-06-21 DIAGNOSIS — Z124 Encounter for screening for malignant neoplasm of cervix: Secondary | ICD-10-CM | POA: Insufficient documentation

## 2011-06-21 DIAGNOSIS — R809 Proteinuria, unspecified: Secondary | ICD-10-CM

## 2011-06-26 ENCOUNTER — Other Ambulatory Visit: Payer: Self-pay | Admitting: Internal Medicine

## 2011-06-28 ENCOUNTER — Other Ambulatory Visit: Payer: Self-pay | Admitting: Internal Medicine

## 2011-07-12 ENCOUNTER — Ambulatory Visit (INDEPENDENT_AMBULATORY_CARE_PROVIDER_SITE_OTHER): Payer: Medicare Other | Admitting: Obstetrics and Gynecology

## 2011-07-12 DIAGNOSIS — M545 Low back pain, unspecified: Secondary | ICD-10-CM

## 2011-07-12 DIAGNOSIS — R350 Frequency of micturition: Secondary | ICD-10-CM

## 2011-07-12 DIAGNOSIS — N390 Urinary tract infection, site not specified: Secondary | ICD-10-CM

## 2011-07-26 ENCOUNTER — Other Ambulatory Visit: Payer: Self-pay

## 2011-07-26 ENCOUNTER — Other Ambulatory Visit: Payer: Self-pay | Admitting: Internal Medicine

## 2011-07-26 MED ORDER — ACYCLOVIR 400 MG PO TABS: 400.0000 mg | ORAL_TABLET | Freq: Every day | ORAL | Status: DC

## 2011-07-26 NOTE — Telephone Encounter (Signed)
IF RX OK PLEASE SIGN & CLOSE IT TO ROUTE TO HER [facility].

## 2011-07-27 NOTE — Telephone Encounter (Signed)
PT. NOTFIED DR. GOTTSEGEN SENT RX TO [facility] AFTER 5PM 07/26/11.

## 2011-08-08 ENCOUNTER — Emergency Department (HOSPITAL_COMMUNITY)
Admission: EM | Admit: 2011-08-08 | Discharge: 2011-08-08 | Disposition: A | Discharge status: Home / Self Care | Payer: Medicare Other | Attending: Emergency Medicine | Admitting: Emergency Medicine

## 2011-08-08 ENCOUNTER — Emergency Department (HOSPITAL_COMMUNITY): Payer: Medicare Other

## 2011-08-08 DIAGNOSIS — M79609 Pain in unspecified limb: Secondary | ICD-10-CM | POA: Insufficient documentation

## 2011-08-08 DIAGNOSIS — K219 Gastro-esophageal reflux disease without esophagitis: Secondary | ICD-10-CM | POA: Insufficient documentation

## 2011-08-08 DIAGNOSIS — Z7982 Long term (current) use of aspirin: Secondary | ICD-10-CM | POA: Insufficient documentation

## 2011-08-08 DIAGNOSIS — I10 Essential (primary) hypertension: Secondary | ICD-10-CM | POA: Insufficient documentation

## 2011-08-08 DIAGNOSIS — G8929 Other chronic pain: Secondary | ICD-10-CM | POA: Insufficient documentation

## 2011-08-08 DIAGNOSIS — E785 Hyperlipidemia, unspecified: Secondary | ICD-10-CM | POA: Insufficient documentation

## 2011-08-08 DIAGNOSIS — M545 Low back pain, unspecified: Secondary | ICD-10-CM | POA: Insufficient documentation

## 2011-08-08 DIAGNOSIS — M5430 Sciatica, unspecified side: Secondary | ICD-10-CM | POA: Insufficient documentation

## 2011-08-08 DIAGNOSIS — IMO0001 Reserved for inherently not codable concepts without codable children: Secondary | ICD-10-CM | POA: Insufficient documentation

## 2011-08-08 DIAGNOSIS — Z79899 Other long term (current) drug therapy: Secondary | ICD-10-CM | POA: Insufficient documentation

## 2011-08-08 DIAGNOSIS — E119 Type 2 diabetes mellitus without complications: Secondary | ICD-10-CM | POA: Insufficient documentation

## 2011-08-13 ENCOUNTER — Other Ambulatory Visit: Payer: Self-pay | Admitting: Orthopedic Surgery

## 2011-08-13 DIAGNOSIS — M545 Low back pain, unspecified: Secondary | ICD-10-CM

## 2011-08-18 ENCOUNTER — Ambulatory Visit
Admission: RE | Admit: 2011-08-18 | Discharge: 2011-08-18 | Disposition: A | Discharge status: Home / Self Care | Payer: Medicare Other | Source: Ambulatory Visit | Attending: Orthopedic Surgery | Admitting: Orthopedic Surgery

## 2011-08-18 DIAGNOSIS — M545 Low back pain, unspecified: Secondary | ICD-10-CM

## 2011-09-24 ENCOUNTER — Other Ambulatory Visit: Payer: Self-pay | Admitting: Orthopedic Surgery

## 2011-09-24 DIAGNOSIS — M5126 Other intervertebral disc displacement, lumbar region: Secondary | ICD-10-CM

## 2011-09-27 ENCOUNTER — Other Ambulatory Visit: Payer: Self-pay | Admitting: Orthopedic Surgery

## 2011-09-27 ENCOUNTER — Ambulatory Visit
Admission: RE | Admit: 2011-09-27 | Discharge: 2011-09-27 | Disposition: A | Discharge status: Home / Self Care | Payer: Medicare Other | Source: Ambulatory Visit | Attending: Orthopedic Surgery | Admitting: Orthopedic Surgery

## 2011-09-27 DIAGNOSIS — M79604 Pain in right leg: Secondary | ICD-10-CM

## 2011-09-27 DIAGNOSIS — M545 Low back pain, unspecified: Secondary | ICD-10-CM

## 2011-09-27 DIAGNOSIS — M5126 Other intervertebral disc displacement, lumbar region: Secondary | ICD-10-CM

## 2011-09-27 MED ORDER — METHYLPREDNISOLONE ACETATE 40 MG/ML INJ SUSP (RADIOLOG
120.0000 mg | Freq: Once | INTRAMUSCULAR | Status: AC
  Administered 2011-09-27: 120 mg via EPIDURAL

## 2011-09-27 MED ORDER — IOHEXOL 180 MG/ML  SOLN
1.0000 mL | Freq: Once | INTRAMUSCULAR | Status: AC | PRN
  Administered 2011-09-27: 1 mL via EPIDURAL

## 2011-10-11 ENCOUNTER — Ambulatory Visit
Admission: RE | Admit: 2011-10-11 | Discharge: 2011-10-11 | Disposition: A | Discharge status: Home / Self Care | Payer: Medicare Other | Source: Ambulatory Visit | Attending: Orthopedic Surgery | Admitting: Orthopedic Surgery

## 2011-10-11 DIAGNOSIS — M545 Low back pain, unspecified: Secondary | ICD-10-CM

## 2011-10-11 DIAGNOSIS — M79604 Pain in right leg: Secondary | ICD-10-CM

## 2011-10-11 MED ORDER — METHYLPREDNISOLONE ACETATE 40 MG/ML INJ SUSP (RADIOLOG
120.0000 mg | Freq: Once | INTRAMUSCULAR | Status: AC
  Administered 2011-10-11: 120 mg via EPIDURAL

## 2011-10-11 MED ORDER — IOHEXOL 180 MG/ML  SOLN
1.0000 mL | Freq: Once | INTRAMUSCULAR | Status: AC | PRN
  Administered 2011-10-11: 1 mL via EPIDURAL

## 2011-10-29 ENCOUNTER — Telehealth: Payer: Self-pay | Admitting: Internal Medicine

## 2011-10-29 NOTE — Telephone Encounter (Signed)
Spoke with patient and she states she has started having reflux, burping and "back up" again. She has been taking Omeprazole daily. She will increase this to BID x 10 days.  She has had Carafate slurry in the past also but does not have any now. Last OV 02/25/10. Hx IBS, GERG, fecal incontince. Please, advise.

## 2011-10-29 NOTE — Telephone Encounter (Signed)
Left a message for patient to call me. 

## 2011-10-30 NOTE — Telephone Encounter (Signed)
I agree to increase Prilosec to bid, MAY ADD CARAFATE 1 GM PO BID, #30. NO REFILL,

## 2011-11-01 ENCOUNTER — Other Ambulatory Visit: Payer: Self-pay | Admitting: Internal Medicine

## 2011-11-01 ENCOUNTER — Telehealth: Payer: Self-pay | Admitting: Internal Medicine

## 2011-11-01 MED ORDER — SUCRALFATE 1 G PO TABS: ORAL_TABLET | ORAL | Status: DC

## 2011-11-01 MED ORDER — OMEPRAZOLE 20 MG PO CPDR: DELAYED_RELEASE_CAPSULE | ORAL | Status: DC

## 2011-11-01 NOTE — Telephone Encounter (Signed)
Patient states pharmacy did not get rx for omeprazole. Rx sent again.

## 2011-11-01 NOTE — Telephone Encounter (Signed)
Spoke with patient and gave her Dr. Regino Schultze recommendation. Rx sent to patient's pharmacy

## 2011-12-13 ENCOUNTER — Other Ambulatory Visit: Payer: Self-pay | Admitting: Obstetrics and Gynecology

## 2011-12-13 ENCOUNTER — Other Ambulatory Visit: Payer: Self-pay | Admitting: Cardiology

## 2012-01-13 ENCOUNTER — Other Ambulatory Visit: Payer: Self-pay | Admitting: *Deleted

## 2012-01-13 MED ORDER — NONFORMULARY OR COMPOUNDED ITEM: Status: DC

## 2012-01-13 NOTE — Telephone Encounter (Signed)
Pt called wanted refill on boric acid 600 mg capsules # 30 1 refill called in to [facility].

## 2012-01-23 ENCOUNTER — Other Ambulatory Visit: Payer: Self-pay | Admitting: Obstetrics and Gynecology

## 2012-02-23 ENCOUNTER — Other Ambulatory Visit: Payer: Self-pay | Admitting: Internal Medicine

## 2012-03-01 ENCOUNTER — Encounter: Payer: Self-pay | Admitting: Obstetrics and Gynecology

## 2012-03-17 ENCOUNTER — Ambulatory Visit (INDEPENDENT_AMBULATORY_CARE_PROVIDER_SITE_OTHER): Payer: Medicare Other | Admitting: Obstetrics and Gynecology

## 2012-03-17 DIAGNOSIS — M549 Dorsalgia, unspecified: Secondary | ICD-10-CM

## 2012-03-17 DIAGNOSIS — R350 Frequency of micturition: Secondary | ICD-10-CM

## 2012-03-17 LAB — URINALYSIS W MICROSCOPIC + REFLEX CULTURE
Ketones, ur: NEGATIVE mg/dL
Leukocytes, UA: NEGATIVE
Nitrite: NEGATIVE
Specific Gravity, Urine: 1.025 (ref 1.005–1.030)
Urobilinogen, UA: 0.2 mg/dL (ref 0.0–1.0)
pH: 5 (ref 5.0–8.0)

## 2012-03-17 MED ORDER — NITROFURANTOIN MONOHYD MACRO 100 MG PO CAPS: 100.0000 mg | ORAL_CAPSULE | Freq: Two times a day (BID) | ORAL | Status: AC

## 2012-03-17 NOTE — Progress Notes (Signed)
The patient came in today complaining of lower back pain and pain in her lower abdomen. This is associated with frequent urination. She does not have dysuria or hematuria. She has not noticed a change in her bowel habits. She does however have some lower abdominal spasm which she associates with her bowels from  time to time. Her urinalysis today was negative.  Exam. Kennon Portela present. Abdomen is soft without guarding rebound or masses. External: Within normal limits BUS: Within normal limits. Cervix and uterus surgically absent. Adnexa: Within normal limits  Assessment: Urinary frequency with back pain  Plan: Urine culture done. Patient started on Macrobid twice a day with food. She will call Monday for culture results and tell us how she feels.

## 2012-03-19 LAB — URINE CULTURE: Colony Count: 10000

## 2012-03-20 ENCOUNTER — Telehealth: Payer: Self-pay | Admitting: *Deleted

## 2012-03-20 NOTE — Telephone Encounter (Signed)
Patient called about results from urine culture.  Informed negative and no meds needed at this time

## 2012-04-06 ENCOUNTER — Other Ambulatory Visit: Payer: Self-pay | Admitting: Cardiology

## 2012-04-22 ENCOUNTER — Other Ambulatory Visit: Payer: Self-pay | Admitting: Internal Medicine

## 2012-04-25 ENCOUNTER — Other Ambulatory Visit: Payer: Self-pay | Admitting: Cardiology

## 2012-05-23 ENCOUNTER — Other Ambulatory Visit: Payer: Self-pay | Admitting: Cardiology

## 2012-06-05 ENCOUNTER — Encounter: Payer: Self-pay | Admitting: *Deleted

## 2012-06-05 ENCOUNTER — Other Ambulatory Visit: Payer: Self-pay | Admitting: Internal Medicine

## 2012-06-05 NOTE — Telephone Encounter (Signed)
Patient advised that she is overdue for office visit. She has scheduled an office visit for tomorrow, 06/06/12.

## 2012-06-06 ENCOUNTER — Encounter: Payer: Self-pay | Admitting: Internal Medicine

## 2012-06-06 ENCOUNTER — Ambulatory Visit (INDEPENDENT_AMBULATORY_CARE_PROVIDER_SITE_OTHER): Payer: Medicare Other | Admitting: Internal Medicine

## 2012-06-06 VITALS — BP 122/60 | HR 64 | Ht 62.0 in | Wt 159.8 lb

## 2012-06-06 DIAGNOSIS — N823 Fistula of vagina to large intestine: Secondary | ICD-10-CM

## 2012-06-06 DIAGNOSIS — K219 Gastro-esophageal reflux disease without esophagitis: Secondary | ICD-10-CM

## 2012-06-06 DIAGNOSIS — N824 Other female intestinal-genital tract fistulae: Secondary | ICD-10-CM

## 2012-06-06 MED ORDER — HYDROCORTISONE ACE-PRAMOXINE 2.5-1 % RE CREA: TOPICAL_CREAM | Freq: Two times a day (BID) | RECTAL | Status: AC

## 2012-06-06 MED ORDER — OMEPRAZOLE 20 MG PO CPDR: 20.0000 mg | DELAYED_RELEASE_CAPSULE | Freq: Two times a day (BID) | ORAL | Status: DC

## 2012-06-06 MED ORDER — ALIGN 4 MG PO CAPS: 1.0000 | ORAL_CAPSULE | Freq: Every day | ORAL | Status: DC

## 2012-06-06 NOTE — Progress Notes (Signed)
Kristen Mahoney 1946/09/18 MRN [id number]        History of Present Illness:  This is a 66 year old African American female with a gastroesophageal reflux and history of irritable bowel syndrome with predominant diarrhea and rectal incompetence causing fecal incontinence . She underwent multiple rectal surgeries at [facility] to correct her rectal sphincter incompetence. She is here today because she ran out of the omeprazole 20 mg twice a day. Last upper endoscopy in November 2003 showed 3 cm hiatal hernia and esophagitis. She has occasional dysphagia to solids or liquids.   Past Medical History  Diagnosis Date  . Leiomyoma   . Vaginal yeast infection     recurrent  . Herpes progenitalis   . Hot flashes   . Urinary, incontinence, stress female   . Spastic colon   . Diabetes mellitus   . Hypertension   . Benign breast cyst in female     PATIENT HAS HISTORY OF BREAST CYSTS  . Hyperlipidemia   . Mitral valve prolapse   . Hiatal hernia   . GERD (gastroesophageal reflux disease)   . Depression   . Anxiety   . Diverticulosis   . IBS (irritable bowel syndrome)    Past Surgical History  Procedure Date  . Partial hysterectomy 1986  . Rectovaginal fistula closure   . Cholecystectomy 2003  . Knee arthroscopy     right  . Breast cystectomy     right  . Ear cystectomy     reports that she has quit smoking. She has never used smokeless tobacco. She reports that she drinks alcohol. She reports that she does not use illicit drugs. family history includes Breast cancer in her paternal aunt; Colon cancer in her father; Colon polyps in her brother and sister; Diabetes in her brother, father, mother, and sister; Heart disease in her brother and father; Hypertension in her father and sister; and Osteoporosis in her mother. Allergies  Allergen Reactions  . Ciprofloxacin Rash  . Morphine And Related Itching        Review of Systems: Positive for dysphagia. Negative for chest pain  The  remainder of the 10 point ROS is negative except as outlined in H&P   Physical Exam: General appearance  Well developed, in no distress. Eyes- non icteric. HEENT nontraumatic, normocephalic. Mouth no lesions, tongue papillated, no cheilosis. Neck supple without adenopathy, thyroid not enlarged, no carotid bruits, no JVD. Lungs Clear to auscultation bilaterally. Cor normal S1, normal S2, regular rhythm, no murmur,  quiet precordium. Abdomen: Soft with normoactive bowel sounds. Tender in left lower quadrant. Liver edge at costal margin. Rectal: Absent rectal sphincter tone with scarring in the rectal ampulla. Stool is Hemoccult negative Extremities no pedal edema. Skin no lesions. Neurological alert and oriented x 3. Psychological normal mood and affect.  Assessment and Plan:   Gastroesophageal reflux disease patient ran out of medication. She is going to restart omeprazole 20 mg twice a day and antireflux measures. Her symptoms of dysphagia are suggestive of early esophageal stricture. She will consider upper endoscopy and dilatation in near future  Colorectal screening. Positive family history of colon cancer in a parent. She has an irritable bowel syndrome with predominant diarrhea and fecal incontinence. There is a history of rectovaginal fistula which was repaired but resulted in incompetent rectal sphincter. She is up-to-date on her colonoscopy. Last exam in March 2009. She will resume  Analpram cream 2.5%, she will start probiotic one a day. She is being evaluated for all bladder tack  by Dr. Vernie Ammons  06/06/2012 Lina Sar

## 2012-06-06 NOTE — Patient Instructions (Addendum)
We have sent the following medications to your pharmacy for you to pick up at your convenience: Omeprazole Analpram We have given you samples of Align. This puts good bacteria back into your colon. You should take 1 capsule by mouth once daily. If this works well for you, it can be purchased over the counter. CC: Dr Sharyn Lull

## 2012-06-22 ENCOUNTER — Ambulatory Visit (INDEPENDENT_AMBULATORY_CARE_PROVIDER_SITE_OTHER): Payer: Medicare Other | Admitting: Obstetrics and Gynecology

## 2012-06-22 ENCOUNTER — Encounter: Payer: Self-pay | Admitting: Obstetrics and Gynecology

## 2012-06-22 VITALS — BP 124/74 | Ht 62.0 in | Wt 160.0 lb

## 2012-06-22 DIAGNOSIS — Z780 Asymptomatic menopausal state: Secondary | ICD-10-CM

## 2012-06-22 DIAGNOSIS — B009 Herpesviral infection, unspecified: Secondary | ICD-10-CM

## 2012-06-22 DIAGNOSIS — B373 Candidiasis of vulva and vagina: Secondary | ICD-10-CM

## 2012-06-22 DIAGNOSIS — N393 Stress incontinence (female) (male): Secondary | ICD-10-CM

## 2012-06-22 DIAGNOSIS — B3731 Acute candidiasis of vulva and vagina: Secondary | ICD-10-CM

## 2012-06-22 DIAGNOSIS — N200 Calculus of kidney: Secondary | ICD-10-CM | POA: Insufficient documentation

## 2012-06-22 NOTE — Progress Notes (Signed)
Patient came to see me today for further followup. She is taking 1.5 milligrams of estradiol daily by mouth for severe hot flashes. She does well with that but is having pigmentation on her face which she thinks is due to the estrogen. When she started to do without it she had terrible symptoms. She is up-to-date on mammograms. Her last bone density was in our office in December of 2008 and was normal. She has had no fractures. We have not repeated because she is on estrogen. She is having no vaginal bleeding. She is having no pelvic pain. She does have urinary stress incontinence without urgency, dysuria or frequency. We sent her to the urologist who  discussed a sling procedure with her. So far she has not decided whether to do the surgery. 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 She does that preventatively and is not getting reoccurrences. She uses boric acid as needed for recurrent yeast infections. She has always had normal Pap smears with her last one June of 2012. She is status post total abdominal hysterectomy. She does her lab through her PCP.  ROS: 12 system review done. Pertinent positives above.other positives include anxiety, depression, diarrhea, hyperlipidemia, irritable bowel syndrome, hiatal hernia,and kidney stone.  HEENT: Within normal limits.Kristen Mahoney present. Neck: No masses. Supraclavicular lymph nodes: Not enlarged. Breasts: Examined in both sitting and lying position. Symmetrical without skin changes or masses. Abdomen: Soft no masses guarding or rebound. No hernias. Pelvic: External within normal limits. BUS within normal limits. Vaginal examination shows good estrogen effect, no cystocele enterocele or rectocele. Cervix and uterus absent. Adnexa within normal limits. Rectovaginal confirmatory. Extremities within normal limits.  Assessment: #1. Menopausal symptoms #2. HSV-2 #3. Skin pigmentation possibly colasma #4. Urinary stress incontinence #5.  Recurrent yeast infections.  Plan: Patient will show her skin pigmentation to the dermatologist to get their opinion. If they think it's estrogen related she will have to choose between the menopausal symptoms and the pigmentation issue. For the moment she will continue estradiol one and 1/2 mg daily. She will continue acyclovir. She will continue boric acid on a when necessary basis. She will continue yearly mammograms. For the moment when she stays on estrogen we'll not do another bone density. She will decide about a sling procedure.The new Pap smear guidelines were discussed with the patient. No Pap was done.

## 2012-07-14 ENCOUNTER — Other Ambulatory Visit: Payer: Self-pay | Admitting: Obstetrics and Gynecology

## 2012-08-03 ENCOUNTER — Other Ambulatory Visit: Payer: Self-pay | Admitting: Obstetrics and Gynecology

## 2012-10-11 ENCOUNTER — Encounter (HOSPITAL_COMMUNITY): Payer: Self-pay | Admitting: Emergency Medicine

## 2012-10-11 ENCOUNTER — Emergency Department (HOSPITAL_COMMUNITY)
Admission: EM | Admit: 2012-10-11 | Discharge: 2012-10-12 | Disposition: A | Discharge status: Home / Self Care | Payer: Medicare Other | Attending: Emergency Medicine | Admitting: Emergency Medicine

## 2012-10-11 DIAGNOSIS — I10 Essential (primary) hypertension: Secondary | ICD-10-CM | POA: Insufficient documentation

## 2012-10-11 DIAGNOSIS — R109 Unspecified abdominal pain: Secondary | ICD-10-CM

## 2012-10-11 DIAGNOSIS — M791 Myalgia, unspecified site: Secondary | ICD-10-CM

## 2012-10-11 DIAGNOSIS — E7800 Pure hypercholesterolemia, unspecified: Secondary | ICD-10-CM | POA: Insufficient documentation

## 2012-10-11 DIAGNOSIS — E785 Hyperlipidemia, unspecified: Secondary | ICD-10-CM | POA: Insufficient documentation

## 2012-10-11 DIAGNOSIS — K219 Gastro-esophageal reflux disease without esophagitis: Secondary | ICD-10-CM | POA: Insufficient documentation

## 2012-10-11 DIAGNOSIS — K589 Irritable bowel syndrome without diarrhea: Secondary | ICD-10-CM | POA: Insufficient documentation

## 2012-10-11 DIAGNOSIS — R197 Diarrhea, unspecified: Secondary | ICD-10-CM | POA: Insufficient documentation

## 2012-10-11 DIAGNOSIS — IMO0001 Reserved for inherently not codable concepts without codable children: Secondary | ICD-10-CM | POA: Insufficient documentation

## 2012-10-11 DIAGNOSIS — E119 Type 2 diabetes mellitus without complications: Secondary | ICD-10-CM | POA: Insufficient documentation

## 2012-10-11 LAB — URINALYSIS, ROUTINE W REFLEX MICROSCOPIC
Bilirubin Urine: NEGATIVE
Glucose, UA: 100 mg/dL — AB
Hgb urine dipstick: NEGATIVE
Ketones, ur: 15 mg/dL — AB
Protein, ur: NEGATIVE mg/dL
Urobilinogen, UA: 0.2 mg/dL (ref 0.0–1.0)

## 2012-10-11 LAB — COMPREHENSIVE METABOLIC PANEL
AST: 11 U/L (ref 0–37)
Albumin: 3.5 g/dL (ref 3.5–5.2)
BUN: 19 mg/dL (ref 6–23)
Calcium: 9.2 mg/dL (ref 8.4–10.5)
Chloride: 105 mEq/L (ref 96–112)
Creatinine, Ser: 0.65 mg/dL (ref 0.50–1.10)
GFR calc non Af Amer: >90 mL/min (ref >90)
Total Bilirubin: 0.2 mg/dL — ABNORMAL LOW (ref 0.3–1.2)

## 2012-10-11 LAB — URINE MICROSCOPIC-ADD ON

## 2012-10-11 LAB — CBC
HCT: 36.3 % (ref 36.0–46.0)
MCH: 31.2 pg (ref 26.0–34.0)
MCV: 91.2 fL (ref 78.0–100.0)
Platelets: 218 10*3/uL (ref 150–400)
RDW: 14.2 % (ref 11.5–15.5)
WBC: 10.9 10*3/uL — ABNORMAL HIGH (ref 4.0–10.5)

## 2012-10-11 NOTE — ED Notes (Signed)
Pt reports RLQ pain x 3 days.  Pt was incontinent of stool today and reports increased pain over the past several days.  No acute distress noted.  Pt A/O x 4, ambulatory.

## 2012-10-11 NOTE — ED Notes (Signed)
C/o RLQ pain with nausea and diarrhea since yesterday.  Denies urinary complaints.

## 2012-10-12 ENCOUNTER — Emergency Department (HOSPITAL_COMMUNITY): Payer: Medicare Other

## 2012-10-12 ENCOUNTER — Encounter (HOSPITAL_COMMUNITY): Payer: Self-pay | Admitting: Radiology

## 2012-10-12 MED ORDER — LEVOFLOXACIN 500 MG PO TABS: 500.0000 mg | ORAL_TABLET | Freq: Every day | ORAL | Status: DC

## 2012-10-12 MED ORDER — HYDROCODONE-ACETAMINOPHEN 5-500 MG PO TABS
1.0000 | ORAL_TABLET | Freq: Four times a day (QID) | ORAL | Status: DC | PRN
Start: 2012-10-12 — End: 2013-04-11

## 2012-10-12 MED ORDER — IOHEXOL 300 MG/ML  SOLN
100.0000 mL | Freq: Once | INTRAMUSCULAR | Status: AC | PRN
  Administered 2012-10-12: 100 mL via INTRAVENOUS

## 2012-10-12 MED ORDER — IOHEXOL 300 MG/ML  SOLN
20.0000 mL | INTRAMUSCULAR | Status: AC
  Administered 2012-10-12: 20 mL via ORAL

## 2012-10-12 NOTE — ED Provider Notes (Addendum)
History     CSN: [id number]  Arrival date & time 10/11/12  2133   First MD Initiated Contact with Patient 10/11/12 2259      Chief Complaint  Patient presents with  . Abdominal Pain    (Consider location/radiation/quality/duration/timing/severity/associated sxs/prior treatment) HPI Comments: Pain started in the right hip after lifting a flower pot and now has moved to the right lower abd.  Patient is a 66 y.o. female presenting with abdominal pain. The history is provided by the patient.  Abdominal Pain The primary symptoms of the illness include abdominal pain and diarrhea. The primary symptoms of the illness do not include fever, shortness of breath, nausea or vomiting. The current episode started 2 days ago. The onset of the illness was gradual. The problem has been gradually worsening.  The abdominal pain radiates to the RLQ. The severity of the abdominal pain is 6/10. The abdominal pain is relieved by nothing. The abdominal pain is exacerbated by movement and certain positions.  The diarrhea began today. The diarrhea is watery. The diarrhea occurs 2 to 4 times per day.  The patient has had a change in bowel habit. Symptoms associated with the illness do not include anorexia, constipation, urgency, frequency or back pain. Significant associated medical issues do not include liver disease. Associated medical issues comments: hx of colitis.    Past Medical History  Diagnosis Date  . Leiomyoma   . Herpes progenitalis   . Hot flashes   . Urinary, incontinence, stress female   . Spastic colon   . Diabetes mellitus   . Hypertension   . Benign breast cyst in female     PATIENT HAS HISTORY OF BREAST CYSTS  . Hyperlipidemia   . Hiatal hernia   . GERD (gastroesophageal reflux disease)   . Depression   . Anxiety   . Diverticulosis   . IBS (irritable bowel syndrome)   . Fibroid   . Kidney stone   . High cholesterol   . Anxiety     Past Surgical History  Procedure Date  .  Rectovaginal fistula closure   . Cholecystectomy 2003  . Knee arthroscopy     right  . Breast cystectomy     right  . Ear cystectomy   . Abdominal hysterectomy     Family History  Problem Relation Age of Onset  . Diabetes Mother   . Diabetes Father   . Colon cancer Father   . Heart disease Father   . Hypertension Father   . Diabetes Sister   . Hypertension Sister   . Diabetes Brother   . Heart disease Brother   . Colon polyps Brother   . Breast cancer Paternal Aunt     Age 45's  . Colon polyps Brother   . Colon polyps Sister   . Diabetes Sister   . Hypertension Sister     History  Substance Use Topics  . Smoking status: Former Games developer  . Smokeless tobacco: Never Used  . Alcohol Use: Yes     occasional Rare    OB History    Grav Para Term Preterm Abortions TAB SAB Ect Mult Living   3 2 2  1  1   2       Review of Systems  Constitutional: Negative for fever.  Respiratory: Negative for cough and shortness of breath.   Cardiovascular: Negative for chest pain.  Gastrointestinal: Positive for abdominal pain and diarrhea. Negative for nausea, vomiting, constipation and anorexia.  Genitourinary: Negative for urgency  and frequency.  Musculoskeletal: Negative for back pain.  All other systems reviewed and are negative.    Allergies  Ciprofloxacin and Morphine and related  Home Medications   Current Outpatient Rx  Name Route Sig Dispense Refill  . ACYCLOVIR 400 MG PO TABS Oral Take 400 mg by mouth every 4 (four) hours while awake.    . ASPIRIN 81 MG PO CHEW Oral Chew 81 mg by mouth at bedtime.     . ATORVASTATIN CALCIUM 20 MG PO TABS Oral Take 20 mg by mouth daily.    [name] Kitchen BORIC ACID POWD Rectal Place 600 mg rectally daily as needed. For infection    . CALTRATE PLUS PO Oral Take 1 tablet by mouth daily.     [name] Kitchen CITALOPRAM HYDROBROMIDE 20 MG PO TABS Oral Take 20 mg by mouth daily.      [name] Kitchen ESTRADIOL 1 MG PO TABS Oral Take 1.5 mg by mouth daily.    [name] Kitchen HYOSCYAMINE SULFATE  0.125 MG SL SUBL Sublingual Place 1 tablet (0.125 mg total) under the tongue every 6 (six) hours as needed for cramping. 90 tablet 0  . METFORMIN HCL 500 MG PO TABS Oral Take 1,000 mg by mouth 2 (two) times daily with a meal.     . METOPROLOL SUCCINATE ER 25 MG PO TB24 Oral Take 25 mg by mouth daily.      . CENTRUM PO Oral Take by mouth.     . OMEPRAZOLE 20 MG PO CPDR Oral Take 1 capsule (20 mg total) by mouth 2 (two) times daily. 60 capsule 4  . VITAMIN E PO Oral Take 1 tablet by mouth daily.       BP 143/59  Pulse 83  Temp 97.9 F (36.6 C) (Oral)  Resp 17  SpO2 97%  Physical Exam  Nursing note and vitals reviewed. Constitutional: She is oriented to person, place, and time. She appears well-developed and well-nourished. No distress.  HENT:  Head: Normocephalic and atraumatic.  Mouth/Throat: Oropharynx is clear and moist.  Eyes: Conjunctivae normal and EOM are normal. Pupils are equal, round, and reactive to light.  Neck: Normal range of motion. Neck supple.  Cardiovascular: Normal rate, regular rhythm and intact distal pulses.   No murmur heard. Pulmonary/Chest: Effort normal and breath sounds normal. No respiratory distress. She has no wheezes. She has no rales.  Abdominal: Soft. Normal appearance. She exhibits no distension. There is tenderness in the right lower quadrant. There is guarding. There is no rebound, no CVA tenderness and negative Murphy's sign.  Musculoskeletal: Normal range of motion. She exhibits no edema and no tenderness.  Neurological: She is alert and oriented to person, place, and time.  Skin: Skin is warm and dry. No rash noted. No erythema.  Psychiatric: She has a normal mood and affect. Her behavior is normal.    ED Course  Procedures (including critical care time)  Labs Reviewed  URINALYSIS, ROUTINE W REFLEX MICROSCOPIC - Abnormal; Notable for the following:    Glucose, UA 100 (*)     Ketones, ur 15 (*)     Leukocytes, UA SMALL (*)     All other  components within normal limits  CBC - Abnormal; Notable for the following:    WBC 10.9 (*)     All other components within normal limits  COMPREHENSIVE METABOLIC PANEL - Abnormal; Notable for the following:    Total Bilirubin 0.2 (*)     All other components within normal limits  URINE MICROSCOPIC-ADD ON -  Abnormal; Notable for the following:    Squamous Epithelial / LPF FEW (*)     Bacteria, UA FEW (*)     Crystals CA OXALATE CRYSTALS (*)     All other components within normal limits   Ct Abdomen Pelvis W Contrast  10/12/2012  *RADIOLOGY REPORT*  Clinical Data: Right lower quadrant pain for 3 days.  Stool incontinence.  CT ABDOMEN AND PELVIS WITH CONTRAST  Technique:  Multidetector CT imaging of the abdomen and pelvis was performed following the standard protocol during bolus administration of intravenous contrast.  Contrast: OMNIPAQUE IOHEXOL 300 MG/ML  SOLN  Comparison: 11/27/2010  Findings: Infiltration or atelectasis in the right lung base. Calcifications in the right costophrenic angle likely are granulomatous and are stable.  Surgical absence of the gallbladder.  The liver, spleen, pancreas, adrenal glands, kidneys, and retroperitoneal lymph nodes are unremarkable.  Calcification of the abdominal aorta without aneurysm.  The stomach, small bowel, and colon are decompressed. No free air or free fluid in the abdomen.  Pelvis:  The uterus is surgically absent.  Adnexal structures are not significantly enlarged.  Cyst in the right ovary is stable.  No bladder wall thickening.  No free or loculated pelvic fluid collections.  There is some thickening of the wall of the descending and sigmoid colon which could represent changes of colitis versus muscular hypertrophy and under distension.  No evidence of diverticulitis.  The appendix is normal.  No significant pelvic lymphadenopathy.  IMPRESSION: Possible wall thickening versus under distension of the descending and sigmoid colon.  Changes may  represent colitis.  Normal appendix.  Infiltration or atelectasis in the right lung base.   Original Report Authenticated By: Marlon Pel, M.D.      1. Abdominal  pain, other specified site   2. Muscular pain       MDM   Patient with right-sided abdominal pain which she states initially started in her hip after picking up a flower pot. However she's had worsening pain in the last 2 days and today he had significant diarrhea. She denies any nausea, vomiting, dysuria, fever. On exam she has significant right-sided abdominal pain but no symptoms concerning for cholecystitis, pancreatitis or diverticulitis. Will need to do further evaluation with a CT scan to rule out appendicitis. Labs are unrevealing including a normal CBC, CMP and UA. CT showed thickening in the descending and sigmoid colon possibly representing colitis but a normal appendix. States possible atelectasis in the right lung however patient denies any respiratory symptoms no coughing. Patient states she has a history of diverticulitis and commonly will get colitis however she's not having any left lower quadrant pain at this time. Discussed with patient giving her an antibiotic if she develops left lower quadrant pain with fevers. She will follow up with her Dr. on Monday. Will treat for muscular pain at this time.        Gwyneth Sprout, MD 10/12/12 1610  Gwyneth Sprout, MD 10/12/12 [phone]

## 2012-10-12 NOTE — ED Notes (Signed)
Patient transported to CT 

## 2012-10-12 NOTE — ED Notes (Signed)
Pt request adult diaper states feels like she may be incontinent of stool

## 2012-10-12 NOTE — ED Notes (Signed)
Return from ct scan. Pt resting with out complaints.

## 2012-10-12 NOTE — ED Notes (Signed)
Ambulates without distress.  

## 2012-10-17 ENCOUNTER — Telehealth: Payer: Self-pay | Admitting: Internal Medicine

## 2012-10-17 DIAGNOSIS — R109 Unspecified abdominal pain: Secondary | ICD-10-CM

## 2012-10-17 NOTE — Telephone Encounter (Signed)
Left a message for patient to call me. 

## 2012-10-17 NOTE — Telephone Encounter (Signed)
Patient states she went to ED on 10/11/12 with hip and abdominal pain and diarrhea. Labs were okay. CT- possible colitis. Denies fever. Offered patient an OV with extender this week. She only wants to see Dr. Juanda Chance. Offered patient Dr. Regino Schultze next available in November but patient will be out of town at that time. Scheduled her on 11/29/12 at 2:30 PM.

## 2012-10-17 NOTE — Telephone Encounter (Signed)
I have reviewed her record from ED 10/11/2012. CT scan shows questionable thickening left colon, possible under distention. She has a hx of IBS/diarrhea. Why don't you put her on  Colestid 1gm,1 or tab/day ,#60. Last colon 2009- parents with colon cancer.Due for recall next year.

## 2012-10-18 MED ORDER — COLESTIPOL HCL 1 G PO TABS: ORAL_TABLET | ORAL | Status: DC

## 2012-10-18 NOTE — Telephone Encounter (Signed)
Spoke with patient and gave her Dr. Brodie's recommendation. Rx sent. 

## 2012-10-18 NOTE — Telephone Encounter (Signed)
Dr. Juanda Chance, please, clarify, do you want her to have Colestid 1 gm 1 tab/day or 1-2 tab/ day.

## 2012-10-18 NOTE — Telephone Encounter (Signed)
1-2 tab/day.

## 2012-10-23 ENCOUNTER — Encounter: Payer: Self-pay | Admitting: Internal Medicine

## 2012-10-23 ENCOUNTER — Ambulatory Visit (INDEPENDENT_AMBULATORY_CARE_PROVIDER_SITE_OTHER): Payer: Medicare Other | Admitting: Internal Medicine

## 2012-10-23 VITALS — BP 110/68 | HR 88 | Ht 62.0 in | Wt 156.4 lb

## 2012-10-23 DIAGNOSIS — K921 Melena: Secondary | ICD-10-CM

## 2012-10-23 DIAGNOSIS — K625 Hemorrhage of anus and rectum: Secondary | ICD-10-CM

## 2012-10-23 DIAGNOSIS — R1031 Right lower quadrant pain: Secondary | ICD-10-CM

## 2012-10-23 MED ORDER — TRAMADOL HCL 50 MG PO TABS: 50.0000 mg | ORAL_TABLET | Freq: Four times a day (QID) | ORAL | Status: DC | PRN

## 2012-10-23 MED ORDER — HYDROCORTISONE ACETATE 25 MG RE SUPP: 25.0000 mg | Freq: Every day | RECTAL | Status: DC

## 2012-10-23 NOTE — Patient Instructions (Addendum)
We have sent the following medications to your pharmacy for you to pick up at your convenience: Tramadol Anusol HC Suppositories  CC: Dr Alesia Richards Nnodi

## 2012-10-23 NOTE — Progress Notes (Signed)
Kristen Mahoney 1946-12-19 MRN [id number]  History of Present Illness:  This is a 66 year old African American female, retired Runner, broadcasting/film/video, who has a family history of colon cancer in a direct relative and personal history of irritable bowel syndrome with predominant diarrhea. She is status post multiple rectal surgeries for incontinence and rectovaginal fistula. She still has intermittent fecal incontinence. She was in the emergency room 2 weeks ago with severe right lower quadrant abdominal pain after trying to pick up a heavy flower pot. A CT scan of the abdomen did not show any hernia or lesion in the right lower quadrant but there was thickening of the left colon which was nonspecific and could represent underdistention or possibly colitis. She has been having low-volume rectal bleeding with every bowel movement. My last anoscopic exam in June 2013 showed prolapsing rectal mucosa and scar tissue from a prior rectal surgery. Her last colonoscopy was done in March 2009 and her recall is due  In March 2014.   Past Medical History  Diagnosis Date  . Leiomyoma   . Herpes progenitalis   . Hot flashes   . Urinary, incontinence, stress female   . Spastic colon   . Diabetes mellitus   . Hypertension   . Benign breast cyst in female     PATIENT HAS HISTORY OF BREAST CYSTS  . Hyperlipidemia   . Hiatal hernia   . GERD (gastroesophageal reflux disease)   . Depression   . Anxiety   . Diverticulosis   . IBS (irritable bowel syndrome)   . Fibroid   . Kidney stone   . High cholesterol   . Anxiety    Past Surgical History  Procedure Date  . Rectovaginal fistula closure   . Cholecystectomy 2003  . Knee arthroscopy     right  . Breast cystectomy     right  . Ear cystectomy   . Abdominal hysterectomy     reports that she has quit smoking. She has never used smokeless tobacco. She reports that she drinks alcohol. She reports that she does not use illicit drugs. family history includes Breast cancer  in her paternal aunt; Colon cancer in her father; Colon polyps in her brothers and sister; Diabetes in her brother, father, mother, and sisters; Heart disease in her brother and father; and Hypertension in her father and sisters. Allergies  Allergen Reactions  . Ciprofloxacin Rash  . Morphine And Related Itching        Review of Systems: Denies fever chills. Denies constipation  The remainder of the 10 point ROS is negative except as outlined in H&P   Physical Exam: General appearance  Well developed, in no distress. Eyes- non icteric. HEENT nontraumatic, normocephalic. Mouth no lesions, tongue papillated, no cheilosis. Neck supple without adenopathy, thyroid not enlarged, no carotid bruits, no JVD. Lungs Clear to auscultation bilaterally. Cor normal S1, normal S2, regular rhythm, no murmur,  quiet precordium. Abdomen: Protuberant soft with tenderness in right lower quadrant and in the groin. Straight leg raising was positive. Sitting up made the pain worse. There was no rebound or palpable mass. Bowel sounds were normal active. Rectal: And anoscopic exam reveals decreased rectal sphincter tone. Prolapse of the rectal mucosa into the anal canal appears fleshy and red. There is no active bleeding. There were no hemorrhoids. Rectal mucosa is scarred from prior surgeries. There is no stricture. Stool is Hemoccult negative. Extremities no pedal edema. Skin no lesions. Neurological alert and oriented x 3. Psychological normal mood and affect.  Assessment and Plan:  Problem #1 Rectal bleeding. Low volume hematochezia coming from rectal source from prior rectal surgeries. There is no evidence of a recto-vaginal fistula. She has partial prolapse of a small piece of rectal tissue which is likely the source of the bleeding. We will treated with Anusol-HC suppositories.  Problem #2 Colorectal screening. There is a history of colon cancer in the patient's father. Her last colonoscopy was in  March 2009 and a recall colonoscopy will be due in March 2014.  Problem #3 Right lower quadrant abdominal pain, most likely musculoskeletal. This would be consistent with a pulled groin muscle. We will start her on tramadol 50 mg twice a day when necessary and will have her use a heating pad and Advil.   10/23/2012 Lina Sar

## 2012-11-29 ENCOUNTER — Ambulatory Visit: Payer: Medicare Other | Admitting: Internal Medicine

## 2012-12-14 ENCOUNTER — Telehealth: Payer: Self-pay | Admitting: *Deleted

## 2012-12-14 DIAGNOSIS — R109 Unspecified abdominal pain: Secondary | ICD-10-CM

## 2012-12-14 MED ORDER — COLESTIPOL HCL 1 G PO TABS: ORAL_TABLET | ORAL | Status: DC

## 2012-12-14 NOTE — Telephone Encounter (Signed)
rx sent

## 2012-12-14 NOTE — Telephone Encounter (Signed)
Dr Juanda Chance- Do you want patient to continue colestid?? Per your 10-17-12 telephone note, you indicated #60 tablets be sent to pharmacy.[name] KitchenMarland KitchenMarland Kitchen

## 2012-12-14 NOTE — Telephone Encounter (Signed)
Continue Colestid indefinitely, for diarrhea.

## 2012-12-29 ENCOUNTER — Emergency Department (HOSPITAL_COMMUNITY): Payer: Medicare Other

## 2012-12-29 ENCOUNTER — Emergency Department (HOSPITAL_COMMUNITY)
Admission: EM | Admit: 2012-12-29 | Discharge: 2012-12-29 | Disposition: A | Discharge status: Home / Self Care | Payer: Medicare Other | Attending: Emergency Medicine | Admitting: Emergency Medicine

## 2012-12-29 ENCOUNTER — Encounter (HOSPITAL_COMMUNITY): Payer: Self-pay

## 2012-12-29 DIAGNOSIS — Z87442 Personal history of urinary calculi: Secondary | ICD-10-CM | POA: Insufficient documentation

## 2012-12-29 DIAGNOSIS — F411 Generalized anxiety disorder: Secondary | ICD-10-CM | POA: Insufficient documentation

## 2012-12-29 DIAGNOSIS — F3289 Other specified depressive episodes: Secondary | ICD-10-CM | POA: Insufficient documentation

## 2012-12-29 DIAGNOSIS — Z8619 Personal history of other infectious and parasitic diseases: Secondary | ICD-10-CM | POA: Insufficient documentation

## 2012-12-29 DIAGNOSIS — R059 Cough, unspecified: Secondary | ICD-10-CM | POA: Insufficient documentation

## 2012-12-29 DIAGNOSIS — Z8719 Personal history of other diseases of the digestive system: Secondary | ICD-10-CM | POA: Insufficient documentation

## 2012-12-29 DIAGNOSIS — E785 Hyperlipidemia, unspecified: Secondary | ICD-10-CM | POA: Insufficient documentation

## 2012-12-29 DIAGNOSIS — R05 Cough: Secondary | ICD-10-CM | POA: Insufficient documentation

## 2012-12-29 DIAGNOSIS — E7800 Pure hypercholesterolemia, unspecified: Secondary | ICD-10-CM | POA: Insufficient documentation

## 2012-12-29 DIAGNOSIS — I10 Essential (primary) hypertension: Secondary | ICD-10-CM | POA: Insufficient documentation

## 2012-12-29 DIAGNOSIS — K219 Gastro-esophageal reflux disease without esophagitis: Secondary | ICD-10-CM | POA: Insufficient documentation

## 2012-12-29 DIAGNOSIS — J111 Influenza due to unidentified influenza virus with other respiratory manifestations: Secondary | ICD-10-CM

## 2012-12-29 DIAGNOSIS — F329 Major depressive disorder, single episode, unspecified: Secondary | ICD-10-CM | POA: Insufficient documentation

## 2012-12-29 DIAGNOSIS — Z87448 Personal history of other diseases of urinary system: Secondary | ICD-10-CM | POA: Insufficient documentation

## 2012-12-29 DIAGNOSIS — R000 Tachycardia, unspecified: Secondary | ICD-10-CM

## 2012-12-29 DIAGNOSIS — R52 Pain, unspecified: Secondary | ICD-10-CM | POA: Insufficient documentation

## 2012-12-29 DIAGNOSIS — Z8742 Personal history of other diseases of the female genital tract: Secondary | ICD-10-CM | POA: Insufficient documentation

## 2012-12-29 DIAGNOSIS — E119 Type 2 diabetes mellitus without complications: Secondary | ICD-10-CM | POA: Insufficient documentation

## 2012-12-29 DIAGNOSIS — Z7982 Long term (current) use of aspirin: Secondary | ICD-10-CM | POA: Insufficient documentation

## 2012-12-29 DIAGNOSIS — Z87891 Personal history of nicotine dependence: Secondary | ICD-10-CM | POA: Insufficient documentation

## 2012-12-29 DIAGNOSIS — J3489 Other specified disorders of nose and nasal sinuses: Secondary | ICD-10-CM | POA: Insufficient documentation

## 2012-12-29 LAB — CBC
MCH: 30.8 pg (ref 26.0–34.0)
MCHC: 33.8 g/dL (ref 30.0–36.0)
MCV: 91.2 fL (ref 78.0–100.0)
Platelets: 191 10*3/uL (ref 150–400)
RBC: 4.19 MIL/uL (ref 3.87–5.11)

## 2012-12-29 LAB — COMPREHENSIVE METABOLIC PANEL
AST: 12 U/L (ref 0–37)
CO2: 25 mEq/L (ref 19–32)
Calcium: 8.7 mg/dL (ref 8.4–10.5)
Creatinine, Ser: 0.56 mg/dL (ref 0.50–1.10)
GFR calc Af Amer: >90 mL/min (ref >90)
GFR calc non Af Amer: >90 mL/min (ref >90)
Glucose, Bld: 112 mg/dL — ABNORMAL HIGH (ref 70–99)

## 2012-12-29 MED ORDER — SODIUM CHLORIDE 0.9 % IV BOLUS (SEPSIS)
1000.0000 mL | Freq: Once | INTRAVENOUS | Status: AC
  Administered 2012-12-29: 1000 mL via INTRAVENOUS

## 2012-12-29 MED ORDER — ONDANSETRON HCL 4 MG/2ML IJ SOLN
4.0000 mg | Freq: Once | INTRAMUSCULAR | Status: AC
  Administered 2012-12-29: 4 mg via INTRAVENOUS
  Filled 2012-12-29: qty 2

## 2012-12-29 MED ORDER — IBUPROFEN 200 MG PO TABS
600.0000 mg | ORAL_TABLET | Freq: Once | ORAL | Status: AC
  Administered 2012-12-29: 600 mg via ORAL
  Filled 2012-12-29: qty 3

## 2012-12-29 MED ORDER — OSELTAMIVIR PHOSPHATE 75 MG PO CAPS: 75.0000 mg | ORAL_CAPSULE | Freq: Two times a day (BID) | ORAL | Status: DC

## 2012-12-29 MED ORDER — SODIUM CHLORIDE 0.9 % IV BOLUS (SEPSIS)
500.0000 mL | Freq: Once | INTRAVENOUS | Status: AC
  Administered 2012-12-29: 500 mL via INTRAVENOUS

## 2012-12-29 MED ORDER — MOXIFLOXACIN HCL 400 MG PO TABS: 400.0000 mg | ORAL_TABLET | Freq: Every day | ORAL | Status: DC

## 2012-12-29 MED ORDER — METOPROLOL SUCCINATE ER 50 MG PO TB24: 50.0000 mg | ORAL_TABLET | Freq: Every day | ORAL | Status: AC

## 2012-12-29 NOTE — ED Notes (Signed)
Flu-like symptoms since Tuesday ,body-aches, cough and congestion.

## 2012-12-29 NOTE — ED Provider Notes (Signed)
History     CSN: [id number]  Arrival date & time 12/29/12  1202   First MD Initiated Contact with Patient 12/29/12 1231      Chief Complaint  Patient presents with  . Influenza    (Consider location/radiation/quality/duration/timing/severity/associated sxs/prior treatment) HPI Pt presents with c/o cough, congestion, body aches.  No fever.  Symptoms have been going on for approx 3 days.  No difficulty breathing, no chest pain.  No vomiting or diarrhea.  Has been able to keep down liquids.  Pt states she has hx of tachycardia.  Has been taking her metoprolol.  Denies any hx of afib or other arrythmia that she can remember d/w Dr. Sharyn Lull.  No specific sick contacts.  States she did get her flu shot this season. There are no other associated systemic symptoms, there are no other alleviating or modifying factors.   Past Medical History  Diagnosis Date  . Leiomyoma   . Herpes progenitalis   . Hot flashes   . Urinary, incontinence, stress female   . Spastic colon   . Diabetes mellitus   . Hypertension   . Benign breast cyst in female     PATIENT HAS HISTORY OF BREAST CYSTS  . Hyperlipidemia   . Hiatal hernia   . GERD (gastroesophageal reflux disease)   . Depression   . Anxiety   . Diverticulosis   . IBS (irritable bowel syndrome)   . Fibroid   . Kidney stone   . High cholesterol   . Anxiety     Past Surgical History  Procedure Date  . Rectovaginal fistula closure   . Cholecystectomy 2003  . Knee arthroscopy     right  . Breast cystectomy     right  . Ear cystectomy   . Abdominal hysterectomy     Family History  Problem Relation Age of Onset  . Diabetes Mother   . Diabetes Father   . Colon cancer Father   . Heart disease Father   . Hypertension Father   . Diabetes Sister   . Hypertension Sister   . Diabetes Brother   . Heart disease Brother   . Colon polyps Brother   . Breast cancer Paternal Aunt     Age 28's  . Colon polyps Brother   . Colon polyps Sister    . Diabetes Sister   . Hypertension Sister     History  Substance Use Topics  . Smoking status: Former Games developer  . Smokeless tobacco: Never Used  . Alcohol Use: Yes     Comment: occasional Rare    OB History    Grav Para Term Preterm Abortions TAB SAB Ect Mult Living   3 2 2  1  1   2       Review of Systems ROS reviewed and all otherwise negative except for mentioned in HPI  Allergies  Ciprofloxacin and Morphine and related  Home Medications   Current Outpatient Rx  Name  Route  Sig  Dispense  Refill  . ACYCLOVIR 400 MG PO TABS   Oral   Take 400 mg by mouth daily.          . ASPIRIN 81 MG PO CHEW   Oral   Chew 81 mg by mouth at bedtime.          . ATORVASTATIN CALCIUM 20 MG PO TABS   Oral   Take 20 mg by mouth daily.         [name] Kitchen BORIC ACID POWD  Vaginal   Place 600 mg vaginally daily as needed. For infection         . CALTRATE PLUS PO   Oral   Take 1 tablet by mouth daily.          [name] Kitchen CITALOPRAM HYDROBROMIDE 20 MG PO TABS   Oral   Take 20 mg by mouth daily.           [name] Kitchen ESTRADIOL 1 MG PO TABS   Oral   Take 1.5 mg by mouth daily.         [name] Kitchen HYDROCODONE-ACETAMINOPHEN 5-500 MG PO TABS   Oral   Take 1 tablet by mouth every 6 (six) hours as needed for pain.   15 tablet   0   . HYDROCORTISONE ACETATE 25 MG RE SUPP   Rectal   Place 1 suppository (25 mg total) rectally at bedtime.   12 suppository   1   . HYOSCYAMINE SULFATE 0.125 MG SL SUBL   Sublingual   Place 1 tablet (0.125 mg total) under the tongue every 6 (six) hours as needed for cramping.   90 tablet   0   . METFORMIN HCL 500 MG PO TABS   Oral   Take 1,000 mg by mouth 2 (two) times daily with a meal.          . METOPROLOL SUCCINATE ER 25 MG PO TB24   Oral   Take 25 mg by mouth daily.           . ICAPS MV PO   Oral   Take 1 capsule by mouth daily.         [name] Kitchen OMEPRAZOLE 20 MG PO CPDR   Oral   Take 20 mg by mouth daily.         [name] Kitchen VITAMIN E PO   Oral   Take 1  tablet by mouth daily.          [name] Kitchen METOPROLOL SUCCINATE ER 50 MG PO TB24   Oral   Take 1 tablet (50 mg total) by mouth daily. Take with or immediately following a meal.   30 tablet   0   . MOXIFLOXACIN HCL 400 MG PO TABS   Oral   Take 1 tablet (400 mg total) by mouth daily.   10 tablet   0   . OSELTAMIVIR PHOSPHATE 75 MG PO CAPS   Oral   Take 1 capsule (75 mg total) by mouth 2 (two) times daily.   10 capsule   0     BP 125/67  Pulse 124  Temp 98.6 F (37 C) (Oral)  Resp 19  Ht 5\' 2"  (1.575 m)  Wt 155 lb (70.308 kg)  BMI 28.35 kg/m2  SpO2 99% Vitals reviewed Physical Exam Physical Examination: General appearance - alert, well appearing, and in no distress Mental status - alert, oriented to person, place, and time Eyes - no conjunctival injection, no scleral icterus Mouth - mucous membranes moist, pharynx normal without lesions Chest - clear to auscultation, no wheezes, rales or rhonchi, symmetric air entry Heart - normal rate, regular rhythm, normal S1, S2, no murmurs, rubs, clicks or gallops Abdomen - soft, nontender, nondistended, no masses or organomegaly Extremities - peripheral pulses normal, no pedal edema, no clubbing or cyanosis Skin - normal coloration and turgor, no rashes  ED Course  Procedures (including critical care time)   Date: 12/29/2012  Rate: 123  Rhythm: sinus tachycardia  QRS Axis: normal  Intervals: normal  ST/T Wave  abnormalities: normal  Conduction Disutrbances:none  Narrative Interpretation:   Old EKG Reviewed: changes noted, rate faster compared to prior ekg of 08/31/09  4:00 PM  D/w Dr. Sharyn Lull- he states she has a hx of sinus tachycardia- no other arrythmias have been found.  Pt has normal blood pressure- states she feels improved after IV hydration, no fever, not anemic.  CXR is reassuring- bronchitic type changes only.  Dr. Sharyn Lull recommends increased metoprolol ER to 50mg  po qD, will also start tamiflu and avelox.     Labs  Reviewed  CBC - Abnormal; Notable for the following:    WBC 11.8 (*)     All other components within normal limits  COMPREHENSIVE METABOLIC PANEL - Abnormal; Notable for the following:    Glucose, Bld 112 (*)     Albumin 3.4 (*)     Total Bilirubin 0.1 (*)     All other components within normal limits  LAB REPORT - SCANNED   No results found.   1. Influenza-like illness   2. Tachycardia       MDM  Pt with viral infection, CXR clear.  However, she has persistent tachycardia despite IV hydration, this is sinus tach.  She is not significantly anemic and not orthostatic.  D/w Dr. Sharyn Lull as above.  Discharged with strict return precautions.  Pt agreeable with plan.        Ethelda Chick, MD 01/02/13 [phone]

## 2013-02-04 ENCOUNTER — Other Ambulatory Visit: Payer: Self-pay | Admitting: Obstetrics and Gynecology

## 2013-02-28 ENCOUNTER — Encounter: Payer: Self-pay | Admitting: Gynecology

## 2013-03-07 ENCOUNTER — Encounter: Payer: Self-pay | Admitting: Internal Medicine

## 2013-03-10 ENCOUNTER — Other Ambulatory Visit: Payer: Self-pay | Admitting: Internal Medicine

## 2013-03-14 ENCOUNTER — Encounter: Payer: Self-pay | Admitting: Internal Medicine

## 2013-03-20 ENCOUNTER — Ambulatory Visit: Payer: Medicare Other | Attending: Neurology | Admitting: Physical Therapy

## 2013-03-20 DIAGNOSIS — R269 Unspecified abnormalities of gait and mobility: Secondary | ICD-10-CM | POA: Insufficient documentation

## 2013-03-20 DIAGNOSIS — IMO0001 Reserved for inherently not codable concepts without codable children: Secondary | ICD-10-CM | POA: Insufficient documentation

## 2013-03-20 DIAGNOSIS — R42 Dizziness and giddiness: Secondary | ICD-10-CM | POA: Insufficient documentation

## 2013-03-26 ENCOUNTER — Telehealth: Payer: Self-pay | Admitting: Neurology

## 2013-03-26 NOTE — Telephone Encounter (Signed)
From: Christeen Douglas [[email or web address]  Sent: Thursday, March 22, 2013 8:44 AM To: Richard Miu Subject: FW: Kristen Mahoney 2046/11/25  FYI, wanted to let you know the below. Thanks! Hope you have a great day!!   From: Ginny Forth  Sent: Wednesday, March 21, 2013 10:38 AM To: Betsy Sweetser Subject: Kristen Mahoney 2046/11/25   Hey Bets This is a replacement cpap referral from Dr. Marko Stai office. I called the pt and talked with her today. She stated that she did not have the money for her initial co payment. ($100.69) Offered to let pt pay  of her co payment and go ahead to be set up. Pt stated she had another medical issue going on right now, and knew that she could not make an appointment for at least 2 weeks. She has my name and ext to call back when she is ready to make an appt. Just wanted to make you aware of the situation with her. Please let me know if you need anything from me. Thanks!

## 2013-04-11 ENCOUNTER — Encounter: Payer: Self-pay | Admitting: Internal Medicine

## 2013-04-11 ENCOUNTER — Ambulatory Visit (AMBULATORY_SURGERY_CENTER): Payer: Medicare Other | Admitting: *Deleted

## 2013-04-11 VITALS — Ht 62.0 in | Wt 157.6 lb

## 2013-04-11 DIAGNOSIS — Z1211 Encounter for screening for malignant neoplasm of colon: Secondary | ICD-10-CM

## 2013-04-11 DIAGNOSIS — K625 Hemorrhage of anus and rectum: Secondary | ICD-10-CM

## 2013-04-11 DIAGNOSIS — Z800 Family history of malignant neoplasm of digestive organs: Secondary | ICD-10-CM

## 2013-04-11 MED ORDER — MOVIPREP 100 G PO SOLR: 1.0000 | Freq: Once | ORAL | Status: DC

## 2013-04-11 NOTE — Progress Notes (Signed)
Denies allergies to eggs or soy products. Denies any complications with sedation or anesthesia. 

## 2013-04-25 ENCOUNTER — Ambulatory Visit (AMBULATORY_SURGERY_CENTER): Payer: Medicare Other | Admitting: Internal Medicine

## 2013-04-25 ENCOUNTER — Encounter: Payer: Self-pay | Admitting: Internal Medicine

## 2013-04-25 ENCOUNTER — Other Ambulatory Visit: Payer: Self-pay | Admitting: Internal Medicine

## 2013-04-25 VITALS — BP 112/85 | HR 73 | Temp 97.6°F | Resp 15 | Ht 62.0 in | Wt 157.0 lb

## 2013-04-25 DIAGNOSIS — K921 Melena: Secondary | ICD-10-CM

## 2013-04-25 DIAGNOSIS — K625 Hemorrhage of anus and rectum: Secondary | ICD-10-CM

## 2013-04-25 DIAGNOSIS — Z800 Family history of malignant neoplasm of digestive organs: Secondary | ICD-10-CM

## 2013-04-25 LAB — GLUCOSE, CAPILLARY
Glucose-Capillary: 121 mg/dL — ABNORMAL HIGH (ref 70–99)
Glucose-Capillary: 110 mg/dL — ABNORMAL HIGH (ref 70–99)

## 2013-04-25 MED ORDER — SODIUM CHLORIDE 0.9 % IV SOLN: 500.0000 mL | INTRAVENOUS | Status: DC

## 2013-04-25 MED ORDER — HYDROCORTISONE ACE-PRAMOXINE 1-1 % RE CREA: TOPICAL_CREAM | Freq: Two times a day (BID) | RECTAL | Status: DC

## 2013-04-25 MED ORDER — HYOSCYAMINE SULFATE 0.125 MG SL SUBL: 0.1250 mg | SUBLINGUAL_TABLET | Freq: Four times a day (QID) | SUBLINGUAL | Status: DC | PRN

## 2013-04-25 NOTE — Op Note (Signed)
[facility] [address] [place], [address]   COLONOSCOPY PROCEDURE REPORT  PATIENT: Kristen Mahoney, Kristen Mahoney  MR#: [id number] BIRTHDATE: 1946/09/22 , 66  yrs. old GENDER: Female ENDOSCOPIST: Hart Carwin, MD REFERRED BY:  Gretel Acre, M.D. PROCEDURE DATE:  04/25/2013 PROCEDURE:   Colonoscopy, screening ASA CLASS:   Class II INDICATIONS:Patient's immediate family history of colon cancer and father with colon cancer, last colon 02/2008, hx of rectovaginal fistula, multiple rectal operations, incompetent rectum,. MEDICATIONS: MAC sedation, administered by CRNA and propofol (Diprivan) 200mg  IV  DESCRIPTION OF PROCEDURE:   After the risks and benefits and of the procedure were explained, informed consent was obtained.  A digital rectal exam revealed decreased sphincter tone.    The LB [id number]-[id number]  endoscope was introduced through the anus and advanced to the cecum, which was identified by both the appendix and ileocecal valve .  The quality of the prep was good, using MoviPrep .  The instrument was then slowly withdrawn as the colon was fully examined.   [Endoscope passed under direct direct vision through the incompetent rectal sphincter into the rectal ampulla. There was some prolapse of the rectal mucosa from the incompetent rectum. There was no evidence of active bleeding but the mucosa appeared somewhat edematous. There was severe spasm in the rectosigmoid colon , it was difficult to advance the scope through especially since patient was unable to hold air,  there  was no evidence of fistula .Colonoscope passed with ease through the descending colon splenic flexure transverse colon hepatic flexure into the ascending colon to the cecum cecal pouch and ileocecal were unremarkable. Endoscope was then retracted and performed before compressed   .retroflexion of the endoscope in the rectum was not possibility to all contracted rectum and unable to give the patient  to hold any air The scope was then withdrawn from the patient and the procedure completed.  COMPLICATIONS: There were no complications. ENDOSCOPIC IMPRESSION: incompetent rectal sphincter. Status post multiple rectal surgery resulting in partial prolapse of the rectal mucosa. No clear hemorrhoids. Rectal bleeding likely related to anorectal source RECOMMENDATIONS: High fiber diet stool softener as needed Analpram cream 2.5% prn rectal irritation   REPEAT EXAM: In 5 year(s)  for Colonoscopy.  cc:  _______________________________ eSignedHart Carwin, MD 04/25/2013 11:33 AM

## 2013-04-25 NOTE — Progress Notes (Signed)
No complaints noted in the recovery room. Maw  Patient did not experience any of the following events: a burn prior to discharge; a fall within the facility; wrong site/side/patient/procedure/implant event; or a hospital transfer or hospital admission upon discharge from the facility. (G8907) Patient did not have preoperative order for IV antibiotic SSI prophylaxis. (G8918)  

## 2013-04-25 NOTE — Patient Instructions (Addendum)
Handout was given on a high fiber diet.  Per Dr. Juanda Chance stool softener as needed.  rx was sent to [facility] analpram cream and a refill of generic levsin.  You may resume your other current medications today.  Please call if any questions or concerns.     YOU HAD AN ENDOSCOPIC PROCEDURE TODAY AT THE [facility]: Refer to the procedure report that was given to you for any specific questions about what was found during the examination.  If the procedure report does not answer your questions, please call your gastroenterologist to clarify.  If you requested that your care partner not be given the details of your procedure findings, then the procedure report has been included in a sealed envelope for you to review at your convenience later.  YOU SHOULD EXPECT: Some feelings of bloating in the abdomen. Passage of more gas than usual.  Walking can help get rid of the air that was put into your GI tract during the procedure and reduce the bloating. If you had a lower endoscopy (such as a colonoscopy or flexible sigmoidoscopy) you may notice spotting of blood in your stool or on the toilet paper. If you underwent a bowel prep for your procedure, then you may not have a normal bowel movement for a few days.  DIET: Your first meal following the procedure should be a light meal and then it is ok to progress to your normal diet.  A half-sandwich or bowl of soup is an example of a good first meal.  Heavy or fried foods are harder to digest and may make you feel nauseous or bloated.  Likewise meals heavy in dairy and vegetables can cause extra gas to form and this can also increase the bloating.  Drink plenty of fluids but you should avoid alcoholic beverages for 24 hours.  ACTIVITY: Your care partner should take you home directly after the procedure.  You should plan to take it easy, moving slowly for the rest of the day.  You can resume normal activity the day after the procedure however you should NOT DRIVE or  use heavy machinery for 24 hours (because of the sedation medicines used during the test).    SYMPTOMS TO REPORT IMMEDIATELY: A gastroenterologist can be reached at any hour.  During normal business hours, 8:30 AM to 5:00 PM Monday through Friday, call [phone].  After hours and on weekends, please call the GI answering service at [phone] who will take a message and have the physician on call contact you.   Following lower endoscopy (colonoscopy or flexible sigmoidoscopy):  Excessive amounts of blood in the stool  Significant tenderness or worsening of abdominal pains  Swelling of the abdomen that is new, acute  Fever of 100F or higher   FOLLOW UP: If any biopsies were taken you will be contacted by phone or by letter within the next 1-3 weeks.  Call your gastroenterologist if you have not heard about the biopsies in 3 weeks.  Our staff will call the home number listed on your records the next business day following your procedure to check on you and address any questions or concerns that you may have at that time regarding the information given to you following your procedure. This is a courtesy call and so if there is no answer at the home number and we have not heard from you through the emergency physician on call, we will assume that you have returned to your regular daily activities  without incident.  SIGNATURES/CONFIDENTIALITY: You and/or your care partner have signed paperwork which will be entered into your electronic medical record.  These signatures attest to the fact that that the information above on your After Visit Summary has been reviewed and is understood.  Full responsibility of the confidentiality of this discharge information lies with you and/or your care-partner.

## 2013-04-26 ENCOUNTER — Telehealth: Payer: Self-pay

## 2013-04-26 NOTE — Telephone Encounter (Signed)
  Follow up Call-  Call back number 04/25/2013  Post procedure Call Back phone  # [phone]  Permission to leave phone message Yes     Patient questions:  Do you have a fever, pain , or abdominal swelling? no Pain Score  0 *  Have you tolerated food without any problems? yes  Have you been able to return to your normal activities? yes  Do you have any questions about your discharge instructions: Diet   no Medications  no Follow up visit  no  Do you have questions or concerns about your Care? no  Actions: * If pain score is 4 or above: No action needed, pain <4.

## 2013-04-30 ENCOUNTER — Ambulatory Visit (INDEPENDENT_AMBULATORY_CARE_PROVIDER_SITE_OTHER): Payer: Medicare Other | Admitting: Women's Health

## 2013-04-30 ENCOUNTER — Telehealth: Payer: Self-pay | Admitting: *Deleted

## 2013-04-30 ENCOUNTER — Encounter: Payer: Self-pay | Admitting: Women's Health

## 2013-04-30 DIAGNOSIS — N898 Other specified noninflammatory disorders of vagina: Secondary | ICD-10-CM

## 2013-04-30 DIAGNOSIS — L293 Anogenital pruritus, unspecified: Secondary | ICD-10-CM

## 2013-04-30 DIAGNOSIS — A599 Trichomoniasis, unspecified: Secondary | ICD-10-CM

## 2013-04-30 LAB — WET PREP FOR TRICH, YEAST, CLUE: Yeast Wet Prep HPF POC: NONE SEEN

## 2013-04-30 MED ORDER — METRONIDAZOLE 500 MG PO TABS: ORAL_TABLET | ORAL | Status: DC

## 2013-04-30 NOTE — Telephone Encounter (Signed)
Telephone call to patient and husband, questions answered.

## 2013-04-30 NOTE — Progress Notes (Signed)
Patient ID: Kristen Mahoney, female   DOB: [birth year], 67 y.o.   MRN: [id number] Presents with vaginal itching and white discharge for last 2 weeks. Monistat gave no relief. Denies urinary symptoms. History of recent antibiotic use (dental) and colonoscopy/normal.  Hysterectomy.  Exam: appears well. External genitalia slightly erythematous. Speculum exam: white discharge. Wet Prep: few trichomonas, moderate clue cells, WBC TNTC, bacteria TNTC.  Trichomonas  Plan: Flagyl 2 gms for pt and husband. Instructed to avoid alcohol while taking Flagyl. Discussed nature of trichomonas/ sexually transmitted, RTO for STD screen if needed.

## 2013-04-30 NOTE — Patient Instructions (Addendum)
Trichinosis Trichinosis is an infection caused by eating the raw, or poorly cooked, meat of meat-eating animals that are infected. The infection is caused by eating the encysted larvae (like a small egg with a tiny worm inside) of the nematode (very small worm) called Trichinella spiralis. It is found in the infected meat of these animals. The seriousness of the disease is usually related to the number of larvae eaten. Once the cyst is in the intestines, the larva is released. These tiny worms then enter the small blood vessels in the intestines and travel throughout the body. They can infect all tissues of the body. SYMPTOMS  When the larvae are in the intestine, they can cause diarrhea and abdominal (belly) pain. There may be swelling around the eyes and muscle aches and pains. The diaphragm (breathing muscle between the chest and abdomen), chest muscles between the ribs, and the muscles of the face and the tongue are also affected. This disease is usually self limited. That means you will usually get well in time without treatment. It can rarely result in death only if there are complications from heavy invasion of the heart, lungs, or central nervous system (brain and spinal cord). DIAGNOSIS  Laboratory blood tests are available that can usually make a positive diagnosis. Examining the infected muscle under the microscope may also help with the diagnosis. If laboratory work is performed, make sure you know how you are to obtain the results. It is your responsibility to follow up and obtain your laboratory results. TREATMENT   There is no known treatment for Trichinosis except for treatment of symptoms. Usually anti-inflammatory medications are used.  Only take over-the-counter or prescription medicines for pain, discomfort, or fever as directed by your caregiver. Discontinue immediately if you have stomach upset. Take medicine only as directed by your caregiver.  Thiabendazole is used as a medication  for known exposure. Steroids are also used for severe cases.  Most symptoms will get better on their own within a year without lasting problems. However, you will be infected for the rest of your life. You cannot pass this infection on to another person even with close personal contact. SEEK IMMEDIATE MEDICAL CARE IF:  You develop any new symptoms such as vomiting, severe headache, stiff or painful neck, chest pain, shortness of breath, trouble breathing, or develop pain uncontrolled with medications.  You develop new problems or worsening of the problems that originally brought you in for care. Document Released: 03/21/2001 Document Revised: 03/06/2012 Document Reviewed: 04/18/2008 [facility] Patient Information 2013 [place], [place]. Trichomoniasis Trichomoniasis is an infection, caused by the Trichomonas organism, that affects both women and men. In women, the outer female genitalia and the vagina are affected. In men, the penis is mainly affected, but the prostate and other reproductive organs can also be involved. Trichomoniasis is a sexually transmitted disease (STD) and is most often passed to another person through sexual contact. The majority of people who get trichomoniasis do so from a sexual encounter and are also at risk for other STDs. CAUSES   Sexual intercourse with an infected partner.  It can be present in swimming pools or hot tubs. SYMPTOMS   Abnormal gray-green frothy vaginal discharge in women.  Vaginal itching and irritation in women.  Itching and irritation of the area outside the vagina in women.  Penile discharge with or without pain in males.  Inflammation of the urethra (urethritis), causing painful urination.  Bleeding after sexual intercourse. RELATED COMPLICATIONS  Pelvic inflammatory disease.  Infection of  the uterus (endometritis).  Infertility.  Tubal (ectopic) pregnancy.  It can be associated with other STDs, including gonorrhea and chlamydia,  hepatitis B, and HIV. COMPLICATIONS DURING PREGNANCY  Early (premature) delivery.  Premature rupture of the membranes (PROM).  Low birth weight. DIAGNOSIS   Visualization of Trichomonas under the microscope from the vagina discharge.  Ph of the vagina greater than 4.5, tested with a test tape.  Trich Rapid Test.  Culture of the organism, but this is not usually needed.  It may be found on a Pap test.  Having a "strawberry cervix,"which means the cervix looks very red like a strawberry. TREATMENT   You may be given medication to fight the infection. Inform your caregiver if you could be or are pregnant. Some medications used to treat the infection should not be taken during pregnancy.  Over-the-counter medications or creams to decrease itching or irritation may be recommended.  Your sexual partner will need to be treated if infected. HOME CARE INSTRUCTIONS   Take all medication prescribed by your caregiver.  Take over-the-counter medication for itching or irritation as directed by your caregiver.  Do not have sexual intercourse while you have the infection.  Do not douche or wear tampons.  Discuss your infection with your partner, as your partner may have acquired the infection from you. Or, your partner may have been the person who transmitted the infection to you.  Have your sex partner examined and treated if necessary.  Practice safe, informed, and protected sex.  See your caregiver for other STD testing. SEEK MEDICAL CARE IF:   You still have symptoms after you finish the medication.  You have an oral temperature above 102 F (38.9 C).  You develop belly (abdominal) pain.  You have pain when you urinate.  You have bleeding after sexual intercourse.  You develop a rash.  The medication makes you sick or makes you throw up (vomit). Document Released: 06/08/2001 Document Revised: 03/06/2012 Document Reviewed: 07/04/2009 [facility] Patient Information  2013 [place], [place].

## 2013-04-30 NOTE — Telephone Encounter (Signed)
Pt asked if you would call her about today visit her husband has a few questions about the visit. Call back # [phone]

## 2013-06-25 ENCOUNTER — Encounter: Payer: Self-pay | Admitting: Gynecology

## 2013-06-25 ENCOUNTER — Ambulatory Visit (INDEPENDENT_AMBULATORY_CARE_PROVIDER_SITE_OTHER): Payer: Medicare Other | Admitting: Gynecology

## 2013-06-25 VITALS — BP 130/76 | Ht 63.0 in | Wt 155.0 lb

## 2013-06-25 DIAGNOSIS — L723 Sebaceous cyst: Secondary | ICD-10-CM

## 2013-06-25 DIAGNOSIS — N952 Postmenopausal atrophic vaginitis: Secondary | ICD-10-CM

## 2013-06-25 DIAGNOSIS — A6000 Herpesviral infection of urogenital system, unspecified: Secondary | ICD-10-CM

## 2013-06-25 DIAGNOSIS — Z79890 Hormone replacement therapy (postmenopausal): Secondary | ICD-10-CM

## 2013-06-25 MED ORDER — ESTRADIOL 1 MG PO TABS: 1.5000 mg | ORAL_TABLET | Freq: Every day | ORAL | Status: DC

## 2013-06-25 MED ORDER — ACYCLOVIR 400 MG PO TABS: ORAL_TABLET | ORAL | Status: DC

## 2013-06-25 NOTE — Progress Notes (Signed)
Kristen Mahoney 1946/07/18 [id number]        67 y.o.  [id number] for followup exam.  Former patient Dr. Eda Mahoney. Several issues noted below.  Past medical history,surgical history, medications, allergies, family history and social history were all reviewed and documented in the EPIC chart.  ROS:  Performed and pertinent positives and negatives are included in the history, assessment and plan .  Exam: [name] Filed Vitals:   06/25/13 1044  BP: 130/76  Height: 5\' 3"  (1.6 m)  Weight: 155 lb (70.308 kg)   General appearance  Normal Skin grossly normal with the exception of classic chloasma on cheeks and upper lip. Head/Neck normal with no cervical or supraclavicular adenopathy thyroid normal Lungs  clear Cardiac RR, without RMG Abdominal  soft, nontender, without masses, organomegaly or hernia Breasts  examined lying and sitting without masses, retractions, discharge or axillary adenopathy. Small classic sebaceous cyst overlying lower xiphoid. Pelvic  Ext/BUS/vagina  normal with atrophic changes.   Adnexa  Without masses or tenderness    Anus and perineum  normal   Rectovaginal  normal sphincter tone without palpated masses or tenderness.    Assessment/Plan:  67 y.o. [name] female for annual exam.   1. ERT. Patient on estradiol 1.5 mg daily. Has issues with hot flashes and night sweats when she tries to stop.  I reviewed the whole issue of HRT with her to include the WHI study with increased risk of stroke, heart attack, DVT and breast cancer. The ACOG and NAMS statements for lowest dose for the shortest period of time reviewed. Transdermal versus oral first-pass effect benefit discussed.  Patient does not want to stop. I did recommend trying 1 mg daily to see if she will tolerate this and to stay on it if she does. Ultimately we'll try to wean over time. I refilled her HRT x1 year. She does not want to try transdermal. 2. Chloasma. Classic chloasma on cheeks and upper lip. Has had  this for years. Has discussed with Dr. Eda Mahoney in the past. Options to stop estrogen altogether as a trial reviewed. Patient's not willing to do this. No real other options available at this point. Followup with dermatology offered. 3. Herpes genitalia. Is on acyclovir 400 mg daily. Has been on for years. Has not had outbreaks for years. Recommended to stop now to see how she does off acyclovir. I did refill her times a year in the event that she starts having recurrences she can reinitiate. I do think it's worth stopping to see what she does off of the medication and she agrees with this. 4. Sebaceous cyst overlying xiphoid. Classic in appearance, present for years, drains intermittently. Options for Gen. surgical referral for excision offered and declined. 5. DEXA 2008 normal. Increase calcium vitamin D reviewed. Is on HRT. Plan repeat closer to 69. 6. Pap smear 2012. No Pap smear done today. No history of abnormal Pap smears previously. Status post hysterectomy for leiomyoma in the past. Options to stop screening altogether she is over the age of 35 and status post hysterectomy for benign indications reviewed. She is comfortable with this. 7. Mammography 02/2013. Continue with annual mammography. SBE monthly reviewed. 8. Colonoscopy 2014. Repeat at their recommended interval. 9. Health maintenance. No blood work done as it is all done to her other physician's offices who follows her for her other issues.    Dara Lords MD, 11:19 AM 06/25/2013

## 2013-06-25 NOTE — Patient Instructions (Signed)
Decrease her estrogen as we discussed. Stop your acyclovir as we discussed. Call me if you have any questions. Otherwise followup in 1 year.

## 2013-06-26 LAB — URINALYSIS W MICROSCOPIC + REFLEX CULTURE
Bilirubin Urine: NEGATIVE
Casts: NONE SEEN
Glucose, UA: NEGATIVE mg/dL
Hgb urine dipstick: NEGATIVE
Ketones, ur: NEGATIVE mg/dL
Protein, ur: NEGATIVE mg/dL
pH: 5.5 (ref 5.0–8.0)

## 2013-06-27 ENCOUNTER — Encounter: Payer: Self-pay | Admitting: Obstetrics and Gynecology

## 2013-06-28 ENCOUNTER — Encounter: Payer: Self-pay | Admitting: Obstetrics and Gynecology

## 2013-07-05 ENCOUNTER — Other Ambulatory Visit: Payer: Self-pay | Admitting: Obstetrics and Gynecology

## 2013-08-08 ENCOUNTER — Encounter: Payer: Self-pay | Admitting: Neurology

## 2013-09-22 ENCOUNTER — Other Ambulatory Visit: Payer: Self-pay | Admitting: Internal Medicine

## 2013-09-24 ENCOUNTER — Telehealth: Payer: Self-pay | Admitting: *Deleted

## 2013-09-24 MED ORDER — OMEPRAZOLE 20 MG PO CPDR: DELAYED_RELEASE_CAPSULE | ORAL | Status: DC

## 2013-09-24 NOTE — Telephone Encounter (Signed)
Message copied by Richardson Chiquito on Mon Sep 24, 2013 10:07 AM ------      Message from: Hart Carwin      Created: Mon Sep 24, 2013  9:52 AM       OK to refill, she has been on it for so long. DB      ----- Message -----         From: Richardson Chiquito, CMA         Sent: 09/24/2013   8:38 AM           To: Hart Carwin, MD            Patient requests refills of omeprazole. I am now getting a possible contraindication between citalopram and omeprazole (causing possible QT prolongation). Are you okay with me continuing to refill?       ------

## 2013-09-24 NOTE — Telephone Encounter (Signed)
rx sent

## 2014-01-01 ENCOUNTER — Other Ambulatory Visit: Payer: Self-pay | Admitting: Internal Medicine

## 2014-01-25 ENCOUNTER — Ambulatory Visit (INDEPENDENT_AMBULATORY_CARE_PROVIDER_SITE_OTHER): Payer: Medicare Other | Admitting: Women's Health

## 2014-01-25 ENCOUNTER — Encounter: Payer: Self-pay | Admitting: Women's Health

## 2014-01-25 DIAGNOSIS — R350 Frequency of micturition: Secondary | ICD-10-CM

## 2014-01-25 DIAGNOSIS — N898 Other specified noninflammatory disorders of vagina: Secondary | ICD-10-CM

## 2014-01-25 DIAGNOSIS — K649 Unspecified hemorrhoids: Secondary | ICD-10-CM

## 2014-01-25 DIAGNOSIS — B3731 Acute candidiasis of vulva and vagina: Secondary | ICD-10-CM

## 2014-01-25 DIAGNOSIS — B373 Candidiasis of vulva and vagina: Secondary | ICD-10-CM

## 2014-01-25 LAB — URINALYSIS W MICROSCOPIC + REFLEX CULTURE
Bilirubin Urine: NEGATIVE
Glucose, UA: NEGATIVE mg/dL
HGB URINE DIPSTICK: NEGATIVE
Ketones, ur: NEGATIVE mg/dL
LEUKOCYTES UA: NEGATIVE
NITRITE: NEGATIVE
PROTEIN: NEGATIVE mg/dL
Specific Gravity, Urine: >1.03 — ABNORMAL HIGH (ref 1.005–1.030)
UROBILINOGEN UA: 0.2 mg/dL (ref 0.0–1.0)
pH: 5.5 (ref 5.0–8.0)

## 2014-01-25 LAB — WET PREP FOR TRICH, YEAST, CLUE
CLUE CELLS WET PREP: NONE SEEN
TRICH WET PREP: NONE SEEN

## 2014-01-25 MED ORDER — FLUCONAZOLE 150 MG PO TABS: 150.0000 mg | ORAL_TABLET | Freq: Once | ORAL | Status: DC

## 2014-01-25 MED ORDER — HYDROCORTISONE ACE-PRAMOXINE 2.5-1 % RE CREA: 1.0000 "application " | TOPICAL_CREAM | Freq: Three times a day (TID) | RECTAL | Status: DC

## 2014-01-25 NOTE — Progress Notes (Signed)
Patient ID: Kristen Mahoney, female   DOB: 03-Oct-1946, 68 y.o.   MRN: [id number] Presents with complaint of increased urinary frequency and vaginal itching for past several days. Also having lower right quadrant discomfort in the groin area. Denies a fever, pain or burning or incontinence with urination. Cared for her 45 yo grandson all last week, frequent carrying him and car seat. TAH for fibroids.  Exam: Appears well. Abdomen soft with no rebound or radiation of pain. External genitalia slight erythema, speculum exam scant discharge wet prep positive for yeast. Bimanual no  adnexal fullness or tenderness. UA: Negative  Yeast vaginitis Lower right quadrant pain/probable muscle strain  Plan: Motrin as needed for discomfort, instructed to followup with primary care if discomfort persists. Diflucan 151 dose. Instructed to call if no relief of vaginal itching.

## 2014-01-25 NOTE — Patient Instructions (Signed)
Monilial Vaginitis  Vaginitis in a soreness, swelling and redness (inflammation) of the vagina and vulva. Monilial vaginitis is not a sexually transmitted infection.  CAUSES   Yeast vaginitis is caused by yeast (candida) that is normally found in your vagina. With a yeast infection, the candida has overgrown in number to a point that upsets the chemical balance.  SYMPTOMS   · White, thick vaginal discharge.  · Swelling, itching, redness and irritation of the vagina and possibly the lips of the vagina (vulva).  · Burning or painful urination.  · Painful intercourse.  DIAGNOSIS   Things that may contribute to monilial vaginitis are:  · Postmenopausal and virginal states.  · Pregnancy.  · Infections.  · Being tired, sick or stressed, especially if you had monilial vaginitis in the past.  · Diabetes. Good control will help lower the chance.  · Birth control pills.  · Tight fitting garments.  · Using bubble bath, feminine sprays, douches or deodorant tampons.  · Taking certain medications that kill germs (antibiotics).  · Sporadic recurrence can occur if you become ill.  TREATMENT   Your caregiver will give you medication.  · There are several kinds of anti monilial vaginal creams and suppositories specific for monilial vaginitis. For recurrent yeast infections, use a suppository or cream in the vagina 2 times a week, or as directed.  · Anti-monilial or steroid cream for the itching or irritation of the vulva may also be used. Get your caregiver's permission.  · Painting the vagina with methylene blue solution may help if the monilial cream does not work.  · Eating yogurt may help prevent monilial vaginitis.  HOME CARE INSTRUCTIONS   · Finish all medication as prescribed.  · Do not have sex until treatment is completed or after your caregiver tells you it is okay.  · Take warm sitz baths.  · Do not douche.  · Do not use tampons, especially scented ones.  · Wear cotton underwear.  · Avoid tight pants and panty  hose.  · Tell your sexual partner that you have a yeast infection. They should go to their caregiver if they have symptoms such as mild rash or itching.  · Your sexual partner should be treated as well if your infection is difficult to eliminate.  · Practice safer sex. Use condoms.  · Some vaginal medications cause latex condoms to fail. Vaginal medications that harm condoms are:  · Cleocin cream.  · Butoconazole (Femstat®).  · Terconazole (Terazol®) vaginal suppository.  · Miconazole (Monistat®) (may be purchased over the counter).  SEEK MEDICAL CARE IF:   · You have a temperature by mouth above 102° F (38.9° C).  · The infection is getting worse after 2 days of treatment.  · The infection is not getting better after 3 days of treatment.  · You develop blisters in or around your vagina.  · You develop vaginal bleeding, and it is not your menstrual period.  · You have pain when you urinate.  · You develop intestinal problems.  · You have pain with sexual intercourse.  Document Released: 09/22/2005 Document Revised: 03/06/2012 Document Reviewed: 06/06/2009  [employer]® Patient Information ©2014 [employer].

## 2014-01-28 ENCOUNTER — Other Ambulatory Visit: Payer: Self-pay | Admitting: Internal Medicine

## 2014-03-08 ENCOUNTER — Other Ambulatory Visit: Payer: Self-pay | Admitting: *Deleted

## 2014-03-08 ENCOUNTER — Encounter: Payer: Self-pay | Admitting: Gynecology

## 2014-03-08 DIAGNOSIS — R928 Other abnormal and inconclusive findings on diagnostic imaging of breast: Secondary | ICD-10-CM

## 2014-04-09 ENCOUNTER — Other Ambulatory Visit: Payer: Self-pay | Admitting: Family Medicine

## 2014-04-09 ENCOUNTER — Ambulatory Visit
Admission: RE | Admit: 2014-04-09 | Discharge: 2014-04-09 | Disposition: A | Discharge status: Home / Self Care | Payer: Medicare Other | Source: Ambulatory Visit | Attending: Family Medicine | Admitting: Family Medicine

## 2014-04-09 DIAGNOSIS — M25569 Pain in unspecified knee: Secondary | ICD-10-CM

## 2014-04-22 ENCOUNTER — Other Ambulatory Visit: Payer: Self-pay | Admitting: Internal Medicine

## 2014-06-26 ENCOUNTER — Encounter: Payer: Self-pay | Admitting: Gynecology

## 2014-06-26 ENCOUNTER — Other Ambulatory Visit: Payer: Self-pay | Admitting: Internal Medicine

## 2014-06-26 ENCOUNTER — Ambulatory Visit (INDEPENDENT_AMBULATORY_CARE_PROVIDER_SITE_OTHER): Payer: Medicare Other | Admitting: Gynecology

## 2014-06-26 ENCOUNTER — Other Ambulatory Visit (HOSPITAL_COMMUNITY)
Admission: RE | Admit: 2014-06-26 | Discharge: 2014-06-26 | Disposition: A | Discharge status: Home / Self Care | Payer: Medicare Other | Source: Ambulatory Visit | Attending: Gynecology | Admitting: Gynecology

## 2014-06-26 VITALS — BP 120/74 | Ht 62.0 in | Wt 160.0 lb

## 2014-06-26 DIAGNOSIS — Z1272 Encounter for screening for malignant neoplasm of vagina: Secondary | ICD-10-CM

## 2014-06-26 DIAGNOSIS — Z79890 Hormone replacement therapy (postmenopausal): Secondary | ICD-10-CM

## 2014-06-26 DIAGNOSIS — Z124 Encounter for screening for malignant neoplasm of cervix: Secondary | ICD-10-CM | POA: Insufficient documentation

## 2014-06-26 DIAGNOSIS — N952 Postmenopausal atrophic vaginitis: Secondary | ICD-10-CM

## 2014-06-26 DIAGNOSIS — N3946 Mixed incontinence: Secondary | ICD-10-CM

## 2014-06-26 MED ORDER — ESTRADIOL 1 MG PO TABS: 1.0000 mg | ORAL_TABLET | Freq: Every day | ORAL | Status: DC

## 2014-06-26 NOTE — Addendum Note (Signed)
Addended by: Nelva Nay on: 06/26/2014 12:00 PM   Modules accepted: Orders

## 2014-06-26 NOTE — Patient Instructions (Signed)
Kristen Mahoney to wean off of the hormone replacement if you can. Call me if you have any issues.  You may obtain a copy of any labs that were done today by logging onto MyChart as outlined in the instructions provided with your AVS (after visit summary). The office will not call with normal lab results but certainly if there are any significant abnormalities then we will contact you.   Health Maintenance, Female A healthy lifestyle and preventative care can promote health and wellness.  Maintain regular health, dental, and eye exams.  Eat a healthy diet. Foods like vegetables, fruits, whole grains, low-fat dairy products, and lean protein foods contain the nutrients you need without too many calories. Decrease your intake of foods high in solid fats, added sugars, and salt. Get information about a proper diet from your caregiver, if necessary.  Regular physical exercise is one of the most important things you can do for your health. Most adults should get at least 150 minutes of moderate-intensity exercise (any activity that increases your heart rate and causes you to sweat) each week. In addition, most adults need muscle-strengthening exercises on 2 or more days a week.   Maintain a healthy weight. The body mass index (BMI) is a screening tool to identify possible weight problems. It provides an estimate of body fat based on height and weight. Your caregiver can help determine your BMI, and can help you achieve or maintain a healthy weight. For adults 20 years and older:  A BMI below 18.5 is considered underweight.  A BMI of 18.5 to 24.9 is normal.  A BMI of 25 to 29.9 is considered overweight.  A BMI of 30 and above is considered obese.  Maintain normal blood lipids and cholesterol by exercising and minimizing your intake of saturated fat. Eat a balanced diet with plenty of fruits and vegetables. Blood tests for lipids and cholesterol should begin at age 33 and be repeated every 5 years. If your  lipid or cholesterol levels are high, you are over 50, or you are a high risk for heart disease, you may need your cholesterol levels checked more frequently.Ongoing high lipid and cholesterol levels should be treated with medicines if diet and exercise are not effective.  If you smoke, find out from your caregiver how to quit. If you do not use tobacco, do not start.  Lung cancer screening is recommended for adults aged 4 80 years who are at high risk for developing lung cancer because of a history of smoking. Yearly low-dose computed tomography (CT) is recommended for people who have at least a 30-pack-year history of smoking and are a current smoker or have quit within the past 15 years. A pack year of smoking is smoking an average of 1 pack of cigarettes a day for 1 year (for example: 1 pack a day for 30 years or 2 packs a day for 15 years). Yearly screening should continue until the smoker has stopped smoking for at least 15 years. Yearly screening should also be stopped for people who develop a health problem that would prevent them from having lung cancer treatment.  If you are pregnant, do not drink alcohol. If you are breastfeeding, be very cautious about drinking alcohol. If you are not pregnant and choose to drink alcohol, do not exceed 1 drink per day. One drink is considered to be 12 ounces (355 mL) of beer, 5 ounces (148 mL) of wine, or 1.5 ounces (44 mL) of liquor.  Avoid use of  street drugs. Do not share needles with anyone. Ask for help if you need support or instructions about stopping the use of drugs.  High blood pressure causes heart disease and increases the risk of stroke. Blood pressure should be checked at least every 1 to 2 years. Ongoing high blood pressure should be treated with medicines, if weight loss and exercise are not effective.  If you are 66 to 69 years old, ask your caregiver if you should take aspirin to prevent strokes.  Diabetes screening involves taking a  blood sample to check your fasting blood sugar level. This should be done once every 3 years, after age 67, if you are within normal weight and without risk factors for diabetes. Testing should be considered at a younger age or be carried out more frequently if you are overweight and have at least 1 risk factor for diabetes.  Breast cancer screening is essential preventative care for women. You should practice "breast self-awareness." This means understanding the normal appearance and feel of your breasts and may include breast self-examination. Any changes detected, no matter how small, should be reported to a caregiver. Women in their 38s and 30s should have a clinical breast exam (CBE) by a caregiver as part of a regular health exam every 1 to 3 years. After age 33, women should have a CBE every year. Starting at age 73, women should consider having a mammogram (breast X-ray) every year. Women who have a family history of breast cancer should talk to their caregiver about genetic screening. Women at a high risk of breast cancer should talk to their caregiver about having an MRI and a mammogram every year.  Breast cancer gene (BRCA)-related cancer risk assessment is recommended for women who have family members with BRCA-related cancers. BRCA-related cancers include breast, ovarian, tubal, and peritoneal cancers. Having family members with these cancers may be associated with an increased risk for harmful changes (mutations) in the breast cancer genes BRCA1 and BRCA2. Results of the assessment will determine the need for genetic counseling and BRCA1 and BRCA2 testing.  The Pap test is a screening test for cervical cancer. Women should have a Pap test starting at age 31. Between ages 23 and 23, Pap tests should be repeated every 2 years. Beginning at age 28, you should have a Pap test every 3 years as long as the past 3 Pap tests have been normal. If you had a hysterectomy for a problem that was not cancer or  a condition that could lead to cancer, then you no longer need Pap tests. If you are between ages 39 and 49, and you have had normal Pap tests going back 10 years, you no longer need Pap tests. If you have had past treatment for cervical cancer or a condition that could lead to cancer, you need Pap tests and screening for cancer for at least 20 years after your treatment. If Pap tests have been discontinued, risk factors (such as a new sexual partner) need to be reassessed to determine if screening should be resumed. Some women have medical problems that increase the chance of getting cervical cancer. In these cases, your caregiver may recommend more frequent screening and Pap tests.  The human papillomavirus (HPV) test is an additional test that may be used for cervical cancer screening. The HPV test looks for the virus that can cause the cell changes on the cervix. The cells collected during the Pap test can be tested for HPV. The HPV test could  be used to screen women aged 62 years and older, and should be used in women of any age who have unclear Pap test results. After the age of 47, women should have HPV testing at the same frequency as a Pap test.  Colorectal cancer can be detected and often prevented. Most routine colorectal cancer screening begins at the age of 80 and continues through age 58. However, your caregiver may recommend screening at an earlier age if you have risk factors for colon cancer. On a yearly basis, your caregiver may provide home test kits to check for hidden blood in the stool. Use of a small camera at the end of a tube, to directly examine the colon (sigmoidoscopy or colonoscopy), can detect the earliest forms of colorectal cancer. Talk to your caregiver about this at age 6, when routine screening begins. Direct examination of the colon should be repeated every 5 to 10 years through age 91, unless early forms of pre-cancerous polyps or small growths are found.  Hepatitis C  blood testing is recommended for all people born from [birth year] through 1966 and any individual with known risks for hepatitis C.  Practice safe sex. Use condoms and avoid high-risk sexual practices to reduce the spread of sexually transmitted infections (STIs). Sexually active women aged 67 and younger should be checked for Chlamydia, which is a common sexually transmitted infection. Older women with new or multiple partners should also be tested for Chlamydia. Testing for other STIs is recommended if you are sexually active and at increased risk.  Osteoporosis is a disease in which the bones lose minerals and strength with aging. This can result in serious bone fractures. The risk of osteoporosis can be identified using a bone density scan. Women ages 76 and over and women at risk for fractures or osteoporosis should discuss screening with their caregivers. Ask your caregiver whether you should be taking a calcium supplement or vitamin D to reduce the rate of osteoporosis.  Menopause can be associated with physical symptoms and risks. Hormone replacement therapy is available to decrease symptoms and risks. You should talk to your caregiver about whether hormone replacement therapy is right for you.  Use sunscreen. Apply sunscreen liberally and repeatedly throughout the day. You should seek shade when your shadow is shorter than you. Protect yourself by wearing long sleeves, pants, a wide-brimmed hat, and sunglasses year round, whenever you are outdoors.  Notify your caregiver of new moles or changes in moles, especially if there is a change in shape or color. Also notify your caregiver if a mole is larger than the size of a pencil eraser.  Stay current with your immunizations. Document Released: 06/28/2011 Document Revised: 04/09/2013 Document Reviewed: 06/28/2011 [facility] Patient Information 2014 [employer].

## 2014-06-26 NOTE — Telephone Encounter (Signed)
NEEDS OFFICE VISIT FOR FURTHER REFILLS  

## 2014-06-26 NOTE — Progress Notes (Signed)
Kristen Mahoney Feb 02, 1946 [id number]        68 y.o.  [id number] for followup exam. Several issues noted below.  Past medical history,surgical history, problem list, medications, allergies, family history and social history were all reviewed and documented as reviewed in the EPIC chart.  ROS:  12 system ROS performed with pertinent positives and negatives included in the history, assessment and plan.   Additional significant findings :  None   Exam: Kim Counsellor Vitals:   06/26/14 1032  BP: 120/74  Height: 5\' 2"  (1.575 m)  Weight: 160 lb (72.576 kg)   General appearance:  Normal affect, orientation and appearance. Skin: Grossly normal HEENT: Without gross lesions.  No cervical or supraclavicular adenopathy. Thyroid normal.  Lungs:  Clear without wheezing, rales or rhonchi Cardiac: RR, without RMG Abdominal:  Soft, nontender, without masses, guarding, rebound, organomegaly or hernia Breasts:  Examined lying and sitting without masses, retractions, discharge or axillary adenopathy. Pelvic:  Ext/BUS/vagina with generalized atrophic changes. Pap of cuff   Adnexa  Without masses or tenderness    Anus and perineum  Normal   Rectovaginal  Normal sphincter tone without palpated masses or tenderness.    Assessment/Plan:  68 y.o. [id number] female for followup exam.  1. Postmenopausal/ERT. Patient continues on Estrace 1 mg daily. Weaned down from 1.5.  I again reviewed the whole issue of HRT with her to include the WHI study with increased risk of stroke, heart attack, DVT and breast cancer. The ACOG and NAMS statements for lowest dose for the shortest period of time reviewed. Transdermal versus oral first-pass effect benefit discussed.  Recommend patient try 0.5 mg daily through the summer and then try to wean off altogether this coming fall and she agrees to do so. I did refill her x1 year in the event that she is intolerant with this and wants to continue. 2. Mixed incontinence. Patient  noted some leakage of urine both from stress as well as some urgency symptoms. Has been going on for years. We'll check urinalysis. Offered urology referral but she declined and states that she would never have surgery or medication at least at this point for it. Followup if wants referral. 3. History of HSV. Stopped her acyclovir and has not had any outbreaks. We'll remain off of this and she'll report any outbreaks. 4. DEXA 2000 normal. Plan repeat at age 51. Increase calcium vitamin D reviewed. 5. Pap smear 2012. Pap of the vaginal cuff today. Options to stop screening as she is over the age of 21 and status post hysterectomy for benign indications reviewed. Will readdress on an annual basis. 6. Mammography 02/2014. Continue with annual mammography. SBE monthly review. 7. Colonoscopy 2014. Repeat at their recommended interval. 8. Health maintenance. No blood work done as this is done through her primary physician's office. Followup one year, sooner as needed.   Note: This document was prepared with digital dictation and possible smart phrase technology. Any transcriptional errors that result from this process are unintentional.   Anastasio Auerbach MD, 11:02 AM 06/26/2014

## 2014-06-27 LAB — URINALYSIS W MICROSCOPIC + REFLEX CULTURE
Bilirubin Urine: NEGATIVE
Casts: NONE SEEN
Crystals: NONE SEEN
Glucose, UA: NEGATIVE mg/dL
Hgb urine dipstick: NEGATIVE
Ketones, ur: NEGATIVE mg/dL
NITRITE: NEGATIVE
PH: 5.5 (ref 5.0–8.0)
PROTEIN: NEGATIVE mg/dL
Specific Gravity, Urine: 1.025 (ref 1.005–1.030)
Urobilinogen, UA: 0.2 mg/dL (ref 0.0–1.0)

## 2014-06-28 LAB — URINE CULTURE: Colony Count: 2000

## 2014-07-01 LAB — CYTOLOGY - PAP

## 2014-07-02 ENCOUNTER — Other Ambulatory Visit: Payer: Self-pay | Admitting: Gynecology

## 2014-08-19 ENCOUNTER — Encounter: Payer: Self-pay | Admitting: Gynecology

## 2014-08-19 ENCOUNTER — Ambulatory Visit (INDEPENDENT_AMBULATORY_CARE_PROVIDER_SITE_OTHER): Payer: Medicare Other | Admitting: Gynecology

## 2014-08-19 DIAGNOSIS — A499 Bacterial infection, unspecified: Secondary | ICD-10-CM

## 2014-08-19 DIAGNOSIS — Z113 Encounter for screening for infections with a predominantly sexual mode of transmission: Secondary | ICD-10-CM

## 2014-08-19 DIAGNOSIS — R350 Frequency of micturition: Secondary | ICD-10-CM

## 2014-08-19 DIAGNOSIS — N760 Acute vaginitis: Secondary | ICD-10-CM

## 2014-08-19 DIAGNOSIS — N898 Other specified noninflammatory disorders of vagina: Secondary | ICD-10-CM

## 2014-08-19 DIAGNOSIS — N3000 Acute cystitis without hematuria: Secondary | ICD-10-CM

## 2014-08-19 DIAGNOSIS — B9689 Other specified bacterial agents as the cause of diseases classified elsewhere: Secondary | ICD-10-CM

## 2014-08-19 DIAGNOSIS — A5901 Trichomonal vulvovaginitis: Secondary | ICD-10-CM

## 2014-08-19 LAB — URINALYSIS W MICROSCOPIC + REFLEX CULTURE
Bilirubin Urine: NEGATIVE
Crystals: NONE SEEN
Glucose, UA: NEGATIVE mg/dL
Hgb urine dipstick: NEGATIVE
KETONES UR: 15 mg/dL — AB
Nitrite: NEGATIVE
Protein, ur: 30 mg/dL — AB
RBC / HPF: NONE SEEN RBC/hpf (ref <3)
UROBILINOGEN UA: 0.2 mg/dL (ref 0.0–1.0)
pH: 5 (ref 5.0–8.0)

## 2014-08-19 LAB — WET PREP FOR TRICH, YEAST, CLUE: Yeast Wet Prep HPF POC: NONE SEEN

## 2014-08-19 MED ORDER — METRONIDAZOLE 500 MG PO TABS: 500.0000 mg | ORAL_TABLET | Freq: Two times a day (BID) | ORAL | Status: DC

## 2014-08-19 MED ORDER — SULFAMETHOXAZOLE-TRIMETHOPRIM 800-160 MG PO TABS: 1.0000 | ORAL_TABLET | Freq: Two times a day (BID) | ORAL | Status: DC

## 2014-08-19 NOTE — Progress Notes (Signed)
Kristen Mahoney [birth year] [id number]        67 y.o.  [id number] presents with a one 2 two-week history of vaginal discharge and irritation.  Also most recently with frequency and some dysuria. No low back pain, fever or chills.  Past medical history,surgical history, problem list, medications, allergies, family history and social history were all reviewed and documented in the EPIC chart.  Directed ROS with pertinent positives and negatives documented in the history of present illness/assessment and plan.  Exam: [name]  General appearance:  Normal Spine straight without CVA tenderness Pelvic external BUS vagina with frothy white discharge. Bimanual without masses or tenderness.  Assessment/Plan:  68 y.o. [id number]  with :   1. UTI. Symptoms and urinalysis consistent with UTI. Will treat with Septra DS 1 by mouth twice a day x3 days. 2. Vaginal discharge. Wet prep positive for bacterial vaginosis and Trichomonas. I reviewed the potential STD nature of trichomonas and the need to have her partner evaluated and treated. We'll treat the patient with Flagyl 500 mg twice a day x7 days, alcohol avoidance reviewed. GC/Chlamydia urinary screen ordered. Followup if symptoms persist, worsen or recur.    Note: This document was prepared with digital dictation and possible smart phrase technology. Any transcriptional errors that result from this process are unintentional.   Kristen Auerbach MD, 4:52 PM 08/19/2014

## 2014-08-19 NOTE — Patient Instructions (Signed)
Take the metronidazole antibiotic twice daily for 7 days, avoid alcohol while taking. Take the Septra antibiotic twice daily for 3 days Have your sexual partner evaluated for Trichomonas and treated. Followup if your symptoms persist, worsen or recur.

## 2014-08-20 LAB — GC/CHLAMYDIA PROBE AMP, URINE
Chlamydia, Swab/Urine, PCR: NEGATIVE
GC Probe Amp, Urine: NEGATIVE

## 2014-08-22 LAB — URINE CULTURE

## 2014-09-10 ENCOUNTER — Ambulatory Visit: Payer: Medicare Other | Admitting: Gynecology

## 2014-09-11 ENCOUNTER — Ambulatory Visit (INDEPENDENT_AMBULATORY_CARE_PROVIDER_SITE_OTHER): Payer: Medicare Other | Admitting: Gynecology

## 2014-09-11 ENCOUNTER — Encounter: Payer: Self-pay | Admitting: Gynecology

## 2014-09-11 DIAGNOSIS — R300 Dysuria: Secondary | ICD-10-CM

## 2014-09-11 DIAGNOSIS — N898 Other specified noninflammatory disorders of vagina: Secondary | ICD-10-CM

## 2014-09-11 DIAGNOSIS — N899 Noninflammatory disorder of vagina, unspecified: Secondary | ICD-10-CM

## 2014-09-11 LAB — URINALYSIS W MICROSCOPIC + REFLEX CULTURE
Bilirubin Urine: NEGATIVE
CASTS: NONE SEEN
Crystals: NONE SEEN
Glucose, UA: NEGATIVE mg/dL
HGB URINE DIPSTICK: NEGATIVE
Ketones, ur: NEGATIVE mg/dL
Nitrite: NEGATIVE
RBC / HPF: NONE SEEN RBC/hpf (ref <3)
Specific Gravity, Urine: 1.02 (ref 1.005–1.030)
Urobilinogen, UA: 0.2 mg/dL (ref 0.0–1.0)
pH: 5.5 (ref 5.0–8.0)

## 2014-09-11 LAB — WET PREP FOR TRICH, YEAST, CLUE: Trich, Wet Prep: NONE SEEN

## 2014-09-11 MED ORDER — METRONIDAZOLE 500 MG PO TABS: 500.0000 mg | ORAL_TABLET | Freq: Two times a day (BID) | ORAL | Status: DC

## 2014-09-11 MED ORDER — FLUCONAZOLE 150 MG PO TABS: 150.0000 mg | ORAL_TABLET | Freq: Once | ORAL | Status: DC

## 2014-09-11 NOTE — Progress Notes (Signed)
Kristen Mahoney 1946/04/12 [id number]        67 y.o.  [id number] Presents with several days of discomfort with urination as far as stinging. Also with vaginal irritation. No significant discharge. No frequency urgency or low back pain. No fever or chills. Is having more of a diffuse abdominal discomfort and diarrhea over the past week to 2 weeks.  Past medical history,surgical history, problem list, medications, allergies, family history and social history were all reviewed and documented in the EPIC chart.  Directed ROS with pertinent positives and negatives documented in the history of present illness/assessment and plan.  Exam:  [name] General appearance:  Normal Spine straight without CVA tenderness Abdomen soft nontender without masses guarding rebound organomegaly. Pelvic external BUS vagina with atrophic changes. Some erythema noted. White discharge noted. Bimanual without masses or tenderness. Rectal exam is normal.  Assessment/Plan:  67 y.o. [id number] with mild dysuria and vaginal irritation. Wet prep is positive for yeast and bacterial vaginosis. History of Trichomonas treated in August. No trichomonas seen today. Urinalysis does show 3-6 WBC and few bacteria but also many epithelial cells. We'll treat the yeast and bacterial vaginosis with Diflucan 150 mg every other day x2 doses and Flagyl 500 mg twice a day x7 days, alcohol avoidance again reviewed. Will hold on treating the urine and await culture results as I suspect the urine is a contamination.  Patient will follow up with Dr. Olevia Perches her gastroenterologist if her abdominal pain/diarrhea continues.     Anastasio Auerbach MD, 11:20 AM 09/11/2014

## 2014-09-11 NOTE — Patient Instructions (Signed)
Take the Diflucan pill once and then repeat the dose 2 days later. Take the Flagyl medication twice daily for 7 days. Avoid alcohol while taking. Follow up if her symptoms persist, worsen or recur.

## 2014-09-13 ENCOUNTER — Telehealth: Payer: Self-pay | Admitting: Internal Medicine

## 2014-09-13 LAB — URINE CULTURE
COLONY COUNT: NO GROWTH
ORGANISM ID, BACTERIA: NO GROWTH

## 2014-09-13 MED ORDER — COLESTIPOL HCL 1 G PO TABS: ORAL_TABLET | ORAL | Status: DC

## 2014-09-13 NOTE — Telephone Encounter (Signed)
Spoke with patient and gave her Dr. Brodie's recommendation.  

## 2014-09-13 NOTE — Telephone Encounter (Signed)
Rx sent. Left a message for patient to call back. 

## 2014-09-13 NOTE — Telephone Encounter (Signed)
Tro Colestid 1 gm, #60, 2 po qd

## 2014-09-13 NOTE — Telephone Encounter (Signed)
Spoke with patient and she states several of her medications cause diarrhea. She has been on Diflucan for vagina infection. She is having diarrhea stools 3 times/day. The stools are urgent. She has seen a small amount of blood occasionally. Offered OV with extender but patient states she cannot come today, Monday or Tuesday and she prefers to see Dr. Olevia Perches. Scheduled on 09/19/14 at 2:30 PM with Dr. Olevia Perches.

## 2014-09-19 ENCOUNTER — Encounter: Payer: Self-pay | Admitting: Internal Medicine

## 2014-09-19 ENCOUNTER — Ambulatory Visit (INDEPENDENT_AMBULATORY_CARE_PROVIDER_SITE_OTHER): Payer: Medicare Other | Admitting: Internal Medicine

## 2014-09-19 VITALS — BP 130/72 | HR 80 | Ht 62.0 in | Wt 159.0 lb

## 2014-09-19 DIAGNOSIS — R159 Full incontinence of feces: Secondary | ICD-10-CM

## 2014-09-19 DIAGNOSIS — R197 Diarrhea, unspecified: Secondary | ICD-10-CM

## 2014-09-19 MED ORDER — METRONIDAZOLE 500 MG PO TABS: 500.0000 mg | ORAL_TABLET | Freq: Two times a day (BID) | ORAL | Status: DC

## 2014-09-19 MED ORDER — FLUCONAZOLE 100 MG PO TABS: 100.0000 mg | ORAL_TABLET | Freq: Every day | ORAL | Status: DC

## 2014-09-19 NOTE — Progress Notes (Signed)
History of Present Illness: This is a  Kristen Mahoney [birth year] [id number]  Note: This dictation was prepared with Dragon digital system. Any transcriptional errors that result from this procedure are unintentional.   History of Present Illness:  This is a 68 year old [place] American female, retired Pharmacist, hospital, with irritable bowel syndrome with predominant diarrhea. She has a history of rectovaginal fistula. She has had several rectal operations resulting in an incompetent rectal sphincter. She has a positive family history of colon cancer in her father and has undergone multiple colonoscopies. The last one in April 2014 showed edematous tissue in the rectal ampulla with spasm in the sigmoid colon and partial prolapse of the rectal mucosa which causes intermittent bleeding. She has been having diarrheal stools. The stools have become more solid since we added colestipol 2 g daily several days ago, and levsin 0.125 mg when necessary. She has been on metformin. The dose has been reduced from 2000 mg a day to 500 mg a day. She has developed a rash at the perianal area and on her buttocks as well as under her breasts which is pruritic. She took a course of Diflucan with some improvement but the rash has recurred. She also has 2.5% hydrocortisone cream to use on the rectum.    Past Medical History  Diagnosis Date  . Herpes progenitalis   . Urinary, incontinence, stress female   . Spastic colon   . Diabetes mellitus   . Hypertension   . Benign breast cyst in female     PATIENT HAS HISTORY OF BREAST CYSTS  . Hyperlipidemia   . Hiatal hernia   . GERD (gastroesophageal reflux disease)   . Depression   . Anxiety   . Diverticulosis   . IBS (irritable bowel syndrome)   . Kidney stone   . High cholesterol   . Anxiety   . Vertigo   . OSA on CPAP   . Cancer     Basal cell  . Arthritis     Past Surgical History  Procedure Laterality Date  . Rectovaginal fistula closure    .  Cholecystectomy  2003  . Knee arthroscopy      right  . Breast cystectomy      right  . Ear cystectomy    . Basal cell excised    . Abdominal hysterectomy  1986    leiomyomata    Allergies  Allergen Reactions  . Ciprofloxacin Rash  . Morphine And Related Itching    Family history and social history have been reviewed.  Review of Systems: Intermittent crampy lower abdominal pain. Diarrhea. Small volume rectal bleeding  The remainder of the 10 point ROS is negative except as outlined in the H&P  Physical Exam: General Appearance Well developed, in no distress Eyes  Non icteric  HEENT  Non traumatic, normocephalic  Mouth No lesion, tongue papillated, no cheilosis Neck Supple without adenopathy, thyroid not enlarged, no carotid bruits, no JVD Lungs Clear to auscultation bilaterally COR Normal S1, normal S2, regular rhythm, no murmur, quiet precordium Abdomen protuberant, soft. Normoactive bowel sounds. Very tender left lower and middle quadrant. No palpable mass or rebound Rectal extensive erythematous and scaly rash around the perineum and on both sides with mild hyperpigmentation and erythema. It resembles yeast infection. Similar lesions are found under her breast. Rectal sphincter is incompetent. There is a small amount of bright red blood at the anal orifice. Stool is loose Hemoccult negative Extremities  No pedal edema Skin No  lesions Neurological Alert and oriented x 3 Psychological Normal mood and affect  Assessment and Plan:   Problem #22 68 year old [place] American female with incompetent rectal sphincter following several rectal operations. She no longer has a rectovaginal fistula but she has irritable bowel syndrome with predominant diarrhea. She may have bacterial overgrowth. We will treat her with Flagyl 500 mg twice a day and will also treat her husband with 500 mg twice a day for 10 days. She will continue on Diflucan 100 mg daily for 12 days. She will use 2.5%  hydrocortisone cream around the rectum and inside the rectum for irritation. I told her to continue to use Imodium when necessary for diarrhea.    Delfin Edis 09/19/2014

## 2014-09-19 NOTE — Patient Instructions (Addendum)
We have sent the following medications to your pharmacy for you to pick up at your convenience: Flagyl Diflucan  Dr Orland Penman, [place]ries

## 2014-09-20 ENCOUNTER — Telehealth: Payer: Self-pay | Admitting: Internal Medicine

## 2014-09-20 NOTE — Telephone Encounter (Signed)
Rx for flagyl and diflucan were sent to her pharmacy on 09/19/14. I have left a message for patient to call back.

## 2014-09-23 ENCOUNTER — Telehealth: Payer: Self-pay | Admitting: Internal Medicine

## 2014-09-23 MED ORDER — HYDROCORTISONE ACE-PRAMOXINE 2.5-1 % RE CREA: 1.0000 "application " | TOPICAL_CREAM | Freq: Three times a day (TID) | RECTAL | Status: DC

## 2014-09-23 NOTE — Telephone Encounter (Signed)
Questions answered.

## 2014-09-23 NOTE — Telephone Encounter (Signed)
Patient states that she told Dr Olevia Perches she thought she had the hydrocortisone cream at home. However, she does not. I will send a script to her pharmacy. Patient advised.

## 2014-09-25 ENCOUNTER — Other Ambulatory Visit: Payer: Self-pay | Admitting: Internal Medicine

## 2014-09-25 ENCOUNTER — Other Ambulatory Visit: Payer: Self-pay | Admitting: *Deleted

## 2014-09-30 ENCOUNTER — Telehealth: Payer: Self-pay | Admitting: *Deleted

## 2014-09-30 NOTE — Telephone Encounter (Signed)
We have gotten a prior authorization from [facility] on patient's omeprazole. Her medication has been approved from 08/26/14-09/27/15. Case [id number].

## 2014-10-10 ENCOUNTER — Other Ambulatory Visit: Payer: Self-pay | Admitting: Internal Medicine

## 2014-10-28 ENCOUNTER — Encounter: Payer: Self-pay | Admitting: Internal Medicine

## 2014-12-23 ENCOUNTER — Other Ambulatory Visit: Payer: Self-pay | Admitting: Internal Medicine

## 2015-02-07 ENCOUNTER — Other Ambulatory Visit: Payer: Self-pay | Admitting: *Deleted

## 2015-02-07 MED ORDER — OMEPRAZOLE 20 MG PO CPDR: DELAYED_RELEASE_CAPSULE | ORAL | Status: DC

## 2015-02-16 ENCOUNTER — Other Ambulatory Visit: Payer: Self-pay | Admitting: Internal Medicine

## 2015-02-18 ENCOUNTER — Ambulatory Visit (INDEPENDENT_AMBULATORY_CARE_PROVIDER_SITE_OTHER): Payer: Medicare Other | Admitting: Women's Health

## 2015-02-18 ENCOUNTER — Encounter: Payer: Self-pay | Admitting: Women's Health

## 2015-02-18 VITALS — BP 124/80 | Ht 62.0 in | Wt 162.0 lb

## 2015-02-18 DIAGNOSIS — A5901 Trichomonal vulvovaginitis: Secondary | ICD-10-CM | POA: Diagnosis not present

## 2015-02-18 DIAGNOSIS — R350 Frequency of micturition: Secondary | ICD-10-CM | POA: Diagnosis not present

## 2015-02-18 DIAGNOSIS — B3731 Acute candidiasis of vulva and vagina: Secondary | ICD-10-CM

## 2015-02-18 DIAGNOSIS — Z113 Encounter for screening for infections with a predominantly sexual mode of transmission: Secondary | ICD-10-CM | POA: Diagnosis not present

## 2015-02-18 DIAGNOSIS — B373 Candidiasis of vulva and vagina: Secondary | ICD-10-CM | POA: Diagnosis not present

## 2015-02-18 LAB — WET PREP FOR TRICH, YEAST, CLUE

## 2015-02-18 LAB — URINALYSIS W MICROSCOPIC + REFLEX CULTURE
BILIRUBIN URINE: NEGATIVE
CASTS: NONE SEEN
Crystals: NONE SEEN
Glucose, UA: 100 mg/dL — AB
Ketones, ur: NEGATIVE mg/dL
NITRITE: NEGATIVE
Specific Gravity, Urine: 1.025 (ref 1.005–1.030)
Urobilinogen, UA: 0.2 mg/dL (ref 0.0–1.0)
pH: 5 (ref 5.0–8.0)

## 2015-02-18 MED ORDER — METRONIDAZOLE 500 MG PO TABS: ORAL_TABLET | ORAL | Status: DC

## 2015-02-18 MED ORDER — FLUCONAZOLE 150 MG PO TABS: 150.0000 mg | ORAL_TABLET | Freq: Once | ORAL | Status: DC

## 2015-02-18 NOTE — Patient Instructions (Signed)
tMonilial Vaginitis Vaginitis in a soreness, swelling and redness (inflammation) of the vagina and vulva. Monilial vaginitis is not a sexually transmitted infection. CAUSES  Yeast vaginitis is caused by yeast (candida) that is normally found in your vagina. With a yeast infection, the candida has overgrown in number to a point that upsets the chemical balance. SYMPTOMS   White, thick vaginal discharge.  Swelling, itching, redness and irritation of the vagina and possibly the lips of the vagina (vulva).  Burning or painful urination.  Painful intercourse. DIAGNOSIS  Things that may contribute to monilial vaginitis are:  Postmenopausal and virginal states.  Pregnancy.  Infections.  Being tired, sick or stressed, especially if you had monilial vaginitis in the past.  Diabetes. Good control will help lower the chance.  Birth control pills.  Tight fitting garments.  Using bubble bath, feminine sprays, douches or deodorant tampons.  Taking certain medications that kill germs (antibiotics).  Sporadic recurrence can occur if you become ill. TREATMENT  Your caregiver will give you medication.  There are several kinds of anti monilial vaginal creams and suppositories specific for monilial vaginitis. For recurrent yeast infections, use a suppository or cream in the vagina 2 times a week, or as directed.  Anti-monilial or steroid cream for the itching or irritation of the vulva may also be used. Get your caregiver's permission.  Painting the vagina with methylene blue solution may help if the monilial cream does not work.  Eating yogurt may help prevent monilial vaginitis. HOME CARE INSTRUCTIONS   Finish all medication as prescribed.  Do not have sex until treatment is completed or after your caregiver tells you it is okay.  Take warm sitz baths.  Do not douche.  Do not use tampons, especially scented ones.  Wear cotton underwear.  Avoid tight pants and panty  hose.  Tell your sexual partner that you have a yeast infection. They should go to their caregiver if they have symptoms such as mild rash or itching.  Your sexual partner should be treated as well if your infection is difficult to eliminate.  Practice safer sex. Use condoms.  Some vaginal medications cause latex condoms to fail. Vaginal medications that harm condoms are:  Cleocin cream.  Butoconazole (Femstat).  Terconazole (Terazol) vaginal suppository.  Miconazole (Monistat) (may be purchased over the counter). SEEK MEDICAL CARE IF:   You have a temperature by mouth above 102 F (38.9 C).  The infection is getting worse after 2 days of treatment.  The infection is not getting better after 3 days of treatment.  You develop blisters in or around your vagina.  You develop vaginal bleeding, and it is not your menstrual period.  You have pain when you urinate.  You develop intestinal problems.  You have pain with sexual intercourse. Document Released: 09/22/2005 Document Revised: 03/06/2012 Document Reviewed: 06/06/2009 [facility] Patient Information 2015 [place], [place]. This information is not intended to replace advice given to you by your health care provider. Make sure you discuss any questions you have with your health care provider. Trichomoniasis Trichomoniasis is an infection caused by an organism called Trichomonas. The infection can affect both women and men. In women, the outer female genitalia and the vagina are affected. In men, the penis is mainly affected, but the prostate and other reproductive organs can also be involved. Trichomoniasis is a sexually transmitted infection (STI) and is most often passed to another person through sexual contact.  RISK FACTORS  Having unprotected sexual intercourse.  Having sexual intercourse with  an infected partner. SIGNS AND SYMPTOMS  Symptoms of trichomoniasis in women include:  Abnormal gray-green frothy vaginal  discharge.  Itching and irritation of the vagina.  Itching and irritation of the area outside the vagina. Symptoms of trichomoniasis in men include:   Penile discharge with or without pain.  Pain during urination. This results from inflammation of the urethra. DIAGNOSIS  Trichomoniasis may be found during a Pap test or physical exam. Your health care provider may use one of the following methods to help diagnose this infection:  Examining vaginal discharge under a microscope. For men, urethral discharge would be examined.  Testing the pH of the vagina with a test tape.  Using a vaginal swab test that checks for the Trichomonas organism. A test is available that provides results within a few minutes.  Doing a culture test for the organism. This is not usually needed. TREATMENT   You may be given medicine to fight the infection. Women should inform their health care provider if they could be or are pregnant. Some medicines used to treat the infection should not be taken during pregnancy.  Your health care provider may recommend over-the-counter medicines or creams to decrease itching or irritation.  Your sexual partner will need to be treated if infected. HOME CARE INSTRUCTIONS   Take medicines only as directed by your health care provider.  Take over-the-counter medicine for itching or irritation as directed by your health care provider.  Do not have sexual intercourse while you have the infection.  Women should not douche or wear tampons while they have the infection.  Discuss your infection with your partner. Your partner may have gotten the infection from you, or you may have gotten it from your partner.  Have your sex partner get examined and treated if necessary.  Practice safe, informed, and protected sex.  See your health care provider for other STI testing. SEEK MEDICAL CARE IF:   You still have symptoms after you finish your medicine.  You develop abdominal  pain.  You have pain when you urinate.  You have bleeding after sexual intercourse.  You develop a rash.  Your medicine makes you sick or makes you throw up (vomit). MAKE SURE YOU:  Understand these instructions.  Will watch your condition.  Will get help right away if you are not doing well or get worse. Document Released: 06/08/2001 Document Revised: 04/29/2014 Document Reviewed: 09/24/2013 [facility] Patient Information 2015 [place], [place]. This information is not intended to replace advice given to you by your health care provider. Make sure you discuss any questions you have with your health care provider.

## 2015-02-18 NOTE — Progress Notes (Signed)
Patient ID: CANDISS GALEANA, female   DOB: 05/24/1946, 69 y.o.   MRN: [id number] Presents with complaint of increased vaginal discharge, vaginal irritation for 3 days. Reports urinary frequency, mild urgency, no pain or burning. Denies abdominal pain or fever. Married/TAH for fibroids.  Exam: Appears well. External genitalia erythematous +1 cystocele, speculum exam moderate amount of a yellow-green discharge with foul odor, wet prep positive for few yeast, amines, moderate Trichomonas, clues. UA: Trace blood, moderate leukocytes, 21-50 WBCs, 0-2 RBCs, TNTC bacteria  Trichomonas Yeast  Plan: Reviewed Trichomonas usually sexually transmitted, reviewed importance of discussing with husband. Flagyl 2 g by mouth, alcohol precautions with refill for husband. Diflucan 150 by mouth 1 dose. Urine culture pending. HIV, hep B, C, RPR. Instructed to call if no relief of discharge.

## 2015-02-18 NOTE — Addendum Note (Signed)
Addended by: [name] on: 02/18/2015 05:03 PM   Modules accepted: Orders

## 2015-02-19 ENCOUNTER — Other Ambulatory Visit: Payer: Self-pay | Admitting: Women's Health

## 2015-02-19 DIAGNOSIS — A749 Chlamydial infection, unspecified: Secondary | ICD-10-CM

## 2015-02-19 LAB — HEPATITIS C ANTIBODY: HCV Ab: NEGATIVE

## 2015-02-19 LAB — HIV ANTIBODY (ROUTINE TESTING W REFLEX): HIV: NONREACTIVE

## 2015-02-19 LAB — GC/CHLAMYDIA PROBE AMP, URINE
CHLAMYDIA, SWAB/URINE, PCR: POSITIVE — AB
GC Probe Amp, Urine: NEGATIVE

## 2015-02-19 LAB — RPR

## 2015-02-19 LAB — HEPATITIS B SURFACE ANTIGEN: HEP B S AG: NEGATIVE

## 2015-02-19 MED ORDER — AZITHROMYCIN 500 MG PO TABS: 1000.0000 mg | ORAL_TABLET | Freq: Every day | ORAL | Status: DC

## 2015-02-20 LAB — URINE CULTURE

## 2015-03-17 DIAGNOSIS — E119 Type 2 diabetes mellitus without complications: Secondary | ICD-10-CM | POA: Insufficient documentation

## 2015-03-20 ENCOUNTER — Ambulatory Visit (INDEPENDENT_AMBULATORY_CARE_PROVIDER_SITE_OTHER): Payer: Medicare Other | Admitting: Women's Health

## 2015-03-20 ENCOUNTER — Encounter: Payer: Self-pay | Admitting: Women's Health

## 2015-03-20 DIAGNOSIS — N898 Other specified noninflammatory disorders of vagina: Secondary | ICD-10-CM

## 2015-03-20 DIAGNOSIS — B009 Herpesviral infection, unspecified: Secondary | ICD-10-CM

## 2015-03-20 LAB — WET PREP FOR TRICH, YEAST, CLUE
CLUE CELLS WET PREP: NONE SEEN
TRICH WET PREP: NONE SEEN
YEAST WET PREP: NONE SEEN

## 2015-03-20 MED ORDER — VALACYCLOVIR HCL 500 MG PO TABS: ORAL_TABLET | ORAL | Status: DC

## 2015-03-20 NOTE — Progress Notes (Signed)
Patient ID: Kristen Mahoney, female   DOB: 09-Aug-1946, 69 y.o.   MRN: [id number] Presents for test of cure chlamydia and Trichomonas. Reports husband states has been treated, has abstained. TAH. Has negative HIV, hepatitis and RPR.  Exam: Appears well. External genitalia within normal limits, speculum exam no visible discharge or erythema, wet prep negative. GC/Chlamydia culture from urethra pending.  Test of cure chlamydia  Plan: Strongly encouraged counseling, Sonda Primes name and number given. GC/Chlamydia culture pending.

## 2015-03-21 LAB — GC/CHLAMYDIA PROBE AMP
CT PROBE, AMP APTIMA: NEGATIVE
GC Probe RNA: NEGATIVE

## 2015-04-07 ENCOUNTER — Encounter: Payer: Self-pay | Admitting: Gynecology

## 2015-05-01 ENCOUNTER — Ambulatory Visit (INDEPENDENT_AMBULATORY_CARE_PROVIDER_SITE_OTHER): Payer: Medicare Other | Admitting: Women's Health

## 2015-05-01 ENCOUNTER — Encounter: Payer: Self-pay | Admitting: Women's Health

## 2015-05-01 DIAGNOSIS — A599 Trichomoniasis, unspecified: Secondary | ICD-10-CM | POA: Diagnosis not present

## 2015-05-01 DIAGNOSIS — N898 Other specified noninflammatory disorders of vagina: Secondary | ICD-10-CM

## 2015-05-01 DIAGNOSIS — B009 Herpesviral infection, unspecified: Secondary | ICD-10-CM

## 2015-05-01 DIAGNOSIS — N988 Other complications associated with artificial fertilization: Secondary | ICD-10-CM | POA: Diagnosis not present

## 2015-05-01 LAB — WET PREP FOR TRICH, YEAST, CLUE: YEAST WET PREP: NONE SEEN

## 2015-05-01 MED ORDER — METRONIDAZOLE 500 MG PO TABS: ORAL_TABLET | ORAL | Status: DC

## 2015-05-01 MED ORDER — ACYCLOVIR 200 MG PO CAPS: ORAL_CAPSULE | ORAL | Status: DC

## 2015-05-01 NOTE — Progress Notes (Signed)
Patient ID: Kristen Mahoney, female   DOB: 29-Nov-1946, 69 y.o.   MRN: [id number] Presents with complaint of increased vaginal discharge with irritation and odor 1 week. Denies urinary symptoms, abdominal pain or fever. History of chlamydia and Trichomonas 2 months ago, negative HIV, hepatitis , RPR.  Exam: Appears well. External genitalia erythematous at introitus, speculum exam copious frothy green discharge noted, wet prep positive for Trichomonas, amines, clues,  TNTC bacteria. GC/Chlamydia culture from urethra.  Trichomonas  Plan: GC/Chlamydia culture pending, Flagyl 2 g by mouth for both she and her husband. Call precautions reviewed. Strongly encouraged counseling, Counselor names given. Reports being married almost 90 years.

## 2015-05-01 NOTE — Patient Instructions (Signed)

## 2015-05-02 ENCOUNTER — Other Ambulatory Visit: Payer: Self-pay | Admitting: Internal Medicine

## 2015-05-02 LAB — GC/CHLAMYDIA PROBE AMP
CT PROBE, AMP APTIMA: NEGATIVE
GC Probe RNA: NEGATIVE

## 2015-05-07 ENCOUNTER — Encounter: Payer: Self-pay | Admitting: Neurology

## 2015-05-07 ENCOUNTER — Ambulatory Visit (INDEPENDENT_AMBULATORY_CARE_PROVIDER_SITE_OTHER): Payer: Medicare Other | Admitting: Neurology

## 2015-05-07 VITALS — BP 130/70 | HR 91 | Resp 18 | Ht 62.0 in | Wt 163.0 lb

## 2015-05-07 DIAGNOSIS — M4806 Spinal stenosis, lumbar region: Secondary | ICD-10-CM | POA: Diagnosis not present

## 2015-05-07 DIAGNOSIS — G4733 Obstructive sleep apnea (adult) (pediatric): Secondary | ICD-10-CM

## 2015-05-07 DIAGNOSIS — M48061 Spinal stenosis, lumbar region without neurogenic claudication: Secondary | ICD-10-CM

## 2015-05-07 DIAGNOSIS — Z9989 Dependence on other enabling machines and devices: Secondary | ICD-10-CM

## 2015-05-07 NOTE — Progress Notes (Signed)
SLEEP MEDICINE CLINIC   Provider:  Larey Seat, M D  Referring Provider: Gavin Pound, MD Primary Care Physician:  Milagros Evener, MD  Chief Complaint  Patient presents with  . Sleep Apnea    rm 10, alone    HPI:  Kristen Mahoney is a 69 y.o. female  Is seen here as a  revisit  from Dr. Orland Penman for sleep medicine evaluation, CPAP compliance,   I have not encountered Kristen Mahoney and a little over 3 years. The patient is here as a new patient as-established patient revisit. She has seen me in the past for her sleep disorder which is obstructive sleep apnea. Her primary care physician is now Borger. She needs new CPAP supplies and ongoing follow-up and for this reason brought his CPAP machine here today. As I have seen Kristen Mahoney last she has become a grandmother her grandson just celebrated his second birthday. She is a mother of 2 adult children. She is retired. He is also followed by orthopedist Dr. Elesa Massed and by cardiologist Dr. Terrence Dupont . She has diabetes which has been well controlled as long as she was able to exercise and walk. Due to knee pain she has been less able to exercise. She has no medication side effects that she reports at this time she feels extraordinarily well but she needs new supplies.  Kristen Mahoney's sleep study from 09-05-2007 revealed an AHI of 29.8, supine AHI of 47.4 and nonsupine of 11.1 REM AHI of 32. Periodic leg movements for a few was an arousal index of 3.6 she was snoring. She was titrated to CPAP but then placed on an auto titrator as she achieved weight loss.  I was able to review the patient's last laboratory results her hemoglobin A1c was elevated after receiving cortisone shots into the knee at 7.7. This has been higher than usual for her. No cause was 160s 3. Cholesterol is controlled. Also she has a discrepancy between LDL and HDL. Rankins once to make sure that the patient is followed for her CPAP and compliance.  Her sleep habits are described as  follows, she usually goes to bed late and watches TV usually a movie at about 2 AM before finally going to sleep. Fall asleep in front of the TV. The TV in the bedroom. She stated that her husband rarely disturbs her sleep and he may occasionally snore. But she is moving restlessly. She may have one bathroom break at night but usually nocturia does not fragment his sleep. And she rises in the morning at 10 AM. Today's an unusual early day for her now in retirement. She may drink some caffeine in the morning. She has breakfast at home. She is trying to exercise by walking.  I don't have Kristen Mahoney original sleep study available here today but the download from her CPAP machine and it shows that she has a 78% compliance using the machine 70 out of 90 days . she was not able to use it since  mid Aprilregularly because of lack of supplies.  She has on 9 days out of 90 days use the machine less than 4 hours. The average user time is 5 hours 30 minutes the machine is an outdoor set between 5 and 10 was 1 cm EPR. Her residual AHI is elevated at 6.2.  The 95% st percentile is 9.9 which tells me that the patient needs a higher pressure window. She has gained weight and this may be the explanation for her  higher pressure needs.  Review of Systems: Out of a complete 14 system review, the patient complains of only the following symptoms, and all other reviewed systems are negative. Big toe pain,  Running up to the knee- DM neuropathy? Gout.  The patient has transiently suffered from higher blood pressures on higher blood glucose levels due to the steroid treatment of her knee pain. She further has reported snoring at times. Vision joint pain aching muscles and diarrhea and dizziness.  The patient still socially drinks some alcohol she drinks her coffee or caffeinated sodas occasionally but certainly not daily she is a retired Sales executive. She lives with her husband.  Epworth score 5 , Fatigue severity score 15  ,  depression score 2.    History   Social History  . Marital Status: Married    Spouse Name: N/A  . Number of Children: 2  . Years of Education: N/A   Occupational History  . Retired    Social History Main Topics  . Smoking status: Former Research scientist (life sciences)  . Smokeless tobacco: Never Used  . Alcohol Use: Yes     Comment: occasional Rare  . Drug Use: No  . Sexual Activity: Yes    Birth Control/ Protection: Surgical   Other Topics Concern  . Not on file   Social History Narrative   Denies caffeine use.    Family History  Problem Relation Age of Onset  . Diabetes Mother   . Diabetes Father   . Colon cancer Father   . Heart disease Father   . Hypertension Father   . Diabetes Sister   . Hypertension Sister   . Diabetes Brother   . Colon polyps Brother   . Breast cancer Paternal Aunt     Age 33's  . Colon polyps Brother   . Heart disease Brother   . Stroke Brother   . Hyperlipidemia Brother   . Colon polyps Sister   . Diabetes Sister   . Hypertension Sister   . Heart disease Sister   . Esophageal cancer Neg Hx   . Rectal cancer Neg Hx   . Stomach cancer Neg Hx     Past Medical History  Diagnosis Date  . Herpes progenitalis   . Urinary, incontinence, stress female   . Spastic colon   . Diabetes mellitus   . Hypertension   . Benign breast cyst in female     PATIENT HAS HISTORY OF BREAST CYSTS  . Kidney stone   . Vertigo   . OSA on CPAP   . Cancer     Basal cell  . Arthritis   . HLD (hyperlipidemia)   . GERD (gastroesophageal reflux disease)   . Anxiety   . IBS (irritable bowel syndrome)     Past Surgical History  Procedure Laterality Date  . Rectovaginal fistula closure    . Cholecystectomy  2003  . Knee arthroscopy      right  . Breast cystectomy      right  . Ear cystectomy    . Basal cell excised    . Abdominal hysterectomy  1986    leiomyomata    Current Outpatient Prescriptions  Medication Sig Dispense Refill  . acyclovir (ZOVIRAX) 200 MG  capsule Take one daily 90 capsule 4  . aspirin 81 MG chewable tablet Chew 81 mg by mouth at bedtime.     [name] Kitchen atorvastatin (LIPITOR) 10 MG tablet Take 10 mg by mouth daily.    . Cholecalciferol (VITAMIN D PO) Take  1 tablet by mouth daily.     . citalopram (CELEXA) 20 MG tablet Take 20 mg by mouth daily.      . cyclobenzaprine (FLEXERIL) 10 MG tablet Take 10 mg by mouth daily. 1.5 to 2 tablets    . estradiol (ESTRACE) 1 MG tablet Take 1 mg by mouth daily. Takes 1/2 mg    . fluconazole (DIFLUCAN) 150 MG tablet Take 1 tablet (150 mg total) by mouth once. 1 tablet 1  . hyoscyamine (LEVSIN SL) 0.125 MG SL tablet PLACE 1 TABLET (0.125 MG TOTAL) UNDER THE TONGUE EVERY 6 (SIX) HOURS AS NEEDED FOR CRAMPING. 90 tablet 0  . meclizine (ANTIVERT) 12.5 MG tablet Take 12.5 mg by mouth 3 (three) times daily as needed for dizziness.    . meloxicam (MOBIC) 15 MG tablet Take 15 mg by mouth daily.    . metFORMIN (GLUCOPHAGE) 500 MG tablet Take 1,000 mg by mouth 2 (two) times daily with a meal.     . metoprolol succinate (TOPROL XL) 50 MG 24 hr tablet Take 1 tablet (50 mg total) by mouth daily. Take with or immediately following a meal. 30 tablet 0  . MICRONIZED COLESTIPOL HCL 1 G tablet TAKE 2 TABLETS BY MOUTH EVERY DAY 60 tablet 2  . Multiple Vitamins-Minerals (ICAPS MV PO) Take 1 capsule by mouth daily.    [name] Kitchen omeprazole (PRILOSEC) 20 MG capsule TAKE 1 CAPSULE TWICE A DAY EVERY DAY, NOT AS NEEDED 180 capsule 1  . tobramycin-dexamethasone (TOBRADEX) ophthalmic solution 1 drop every 4 (four) hours while awake.    [name] Kitchen VITAMIN E PO Take 1 tablet by mouth daily.     [name] Kitchen azithromycin (ZITHROMAX) 500 MG tablet Take 2 tablets (1,000 mg total) by mouth daily. (Patient not taking: Reported on 05/01/2015) 2 tablet 0  . glimepiride (AMARYL) 2 MG tablet   2  . hydrocortisone-pramoxine (ANALPRAM-HC) 2.5-1 % rectal cream Place 1 application rectally 3 (three) times daily. (Patient not taking: Reported on 05/07/2015) 30 g 0  . metroNIDAZOLE  (FLAGYL) 500 MG tablet Take 2 grams (4 pills) today and 2 grams (4pills) tomarrow (Patient not taking: Reported on 05/07/2015) 8 tablet 0  . valACYclovir (VALTREX) 500 MG tablet Take twice daily 3-5 as needed (Patient not taking: Reported on 05/07/2015) 30 tablet 12   No current facility-administered medications for this visit.    Allergies as of 05/07/2015 - Review Complete 05/07/2015  Allergen Reaction Noted  . Ciprofloxacin Rash 09/04/2009  . Morphine and related Itching 05/07/2011    Vitals: BP 130/70 mmHg  Pulse 91  Resp 18  Ht 5\' 2"  (1.575 m)  Wt 163 lb (73.936 kg)  BMI 29.81 kg/m2 Last Weight:  Wt Readings from Last 1 Encounters:  05/07/15 163 lb (73.936 kg)       Last Height:   Ht Readings from Last 1 Encounters:  05/07/15 5\' 2"  (1.575 m)    Physical exam:  General: The patient is awake, alert and appears not in acute distress. The patient is well groomed. Head: Normocephalic, atraumatic. Neck is supple. Mallampati 3   neck circumference: 14 inches . Nasal airflow ; allergies affected. TMJ is  Not  evident . Retrognathia is seen.  Cardiovascular:  Regular rate and rhythm without  murmurs or carotid bruit, and without distended neck veins. Respiratory: Lungs are clear to auscultation. Skin:  Without evidence of edema, or rash Trunk: BMI is  elevated and patient  has normal posture.  Neurologic exam : The patient is awake and  alert, oriented to place and time.   Memory subjective  described as intact. There is a normal attention span & concentration ability. Speech is fluent without dysarthria, dysphonia or aphasia. Mood and affect are appropriate.  Cranial nerves: Pupils are equal and briskly reactive to light. Funduscopic exam without evidence of pallor or edema. Extraocular movements  in vertical and horizontal planes intact and without nystagmus. Visual fields by finger perimetry are intact. Hearing to finger rub intact.   Facial sensation intact to fine touch.  Facial motor strength is symmetric and tongue and uvula move midline.  Motor exam:  Normal tone ,muscle bulk and symmetric strength in all extremities.  Sensory:  Fine touch, pinprick and vibration were tested in all extremities. Proprioception  normal.  Coordination: Rapid alternating movements in the fingers/hands is normal. Finger-to-nose maneuver normal without evidence of ataxia, dysmetria or tremor.  Gait and station: Patient walks without assistive device and is able unassisted to climb up to the exam table. Strength within normal limits. Stance is stable but she begun to sway with her eyes closed. Feels as if she falls.  Tandem gait is unfragmented. Romberg negative.  Deep tendon reflexes: in the  upper and lower extremities are very brisk , no clonus symmetric t. Babinski maneuver downgoing.   Assessment:  After physical and neurologic examination, review of laboratory studies, imaging, neurophysiology testing and pre-existing records, assessment is   Kristen Mahoney is obstructive sleep apnea is at this time not longer well controlled at a residual AHI of 6.9. I found her visits noted from 03-01-2013 at that time she actually had an AHI of 10.8. She's been on CPAP since 2003 he had sleep studies at 2003 2006 and 2008. She does not have symptoms of excessive daytime sleepiness.   She also reports dizziness sometimes with movement and postural changes sometimes just by turning at night to her right side she will have a spinning sensation. She reports her head feels heavy. I'm not sure that this is sleep related bed of this may be more blood pressure related or even diabetic promoted. She does not have tinnitus with this she's not go complaining of a straight vertigo and she does not have nausea or vomiting this all spells. I would like for her to rise slowly and to also measure her blood sugar in the morning.  Possible spinal stenosis , hyperreflexia in both knees , in spite of diabetes.  Weakness when rising , needs to brace herself.   The patient was advised of the nature of the diagnosed sleep disorder , the treatment options and risks for general a health and wellness arising from not treating the condition. Visit duration was 30 minutes.   Plan:  Treatment plan and additional workup :  I will prescribe replacement mask tubing filter etc. for the patient I also want her pressure window to be elevated to 12 cm water.  Estimated dizziness it begun after her knee was treated with steroids. This time correlation points to words blood sugar and blood pressure to be part of her dizziness spells. I would like for her to resume exercising and as she has trouble walking, I would prefer her to do aquatic exercises. You can also use a bicycle or a rowing machine.  Please discuss a uric acid test for gout with PCP and possible " synovia"  injections with your orthopedist . Weight loss, exercise, CPAP adjustment.    Rv in 12 month with CPAP, NP.  Asencion Partridge Keneth Borg MD  05/07/2015

## 2015-05-07 NOTE — Patient Instructions (Signed)
Spinal Stenosis °Spinal stenosis is when the open spaces between the bones of your spine (vertebrae) get smaller (narrow). It is caused by bone pushing on the open spaces of your spine. This puts pressure on your spine and the nerves in your spine. You may be given medicine to lessen the puffiness (inflammation) in your nerves. Other times, surgery is needed. °HOME CARE °· Change positions when you sit, stand, and lie. This can help take pressure off your nerves. °· Do exercises as told by your doctor to strengthen the middle part of your body. °· Lose weight if your doctor suggests it. This takes pressure off your spine. °· Take all medicine as told by your doctor. °MAKE SURE YOU: °· Understand these instructions. °· Will watch your condition. °· Will get help right away if you are not doing well or get worse. °Document Released: 04/08/2011 Document Revised: 08/15/2013 Document Reviewed: 03/23/2013 °[employer]® Patient Information ©2015 [employer]. This information is not intended to replace advice given to you by your health care provider. Make sure you discuss any questions you have with your health care provider. ° °

## 2015-05-27 ENCOUNTER — Telehealth: Payer: Self-pay | Admitting: Neurology

## 2015-05-27 NOTE — Telephone Encounter (Signed)
Pt called office stating that she when she last saw Dr Brett Fairy she was told an order would be placed to [facility] for cpap supplies and setting changes. Pt states she spoke with [facility] recently and was told the order was never submitted. Please review the request and contact pt.

## 2015-05-27 NOTE — Telephone Encounter (Signed)
Spoke to pt and informed her that I would re-fax the order to [facility] but to please let me know if she hasn't heard from them in a few days. Pt verbalized understanding.

## 2015-07-18 ENCOUNTER — Ambulatory Visit (INDEPENDENT_AMBULATORY_CARE_PROVIDER_SITE_OTHER): Payer: Medicare Other | Admitting: Women's Health

## 2015-07-18 ENCOUNTER — Encounter: Payer: Self-pay | Admitting: Women's Health

## 2015-07-18 ENCOUNTER — Telehealth: Payer: Self-pay | Admitting: *Deleted

## 2015-07-18 VITALS — BP 130/80 | Ht 62.0 in | Wt 161.0 lb

## 2015-07-18 DIAGNOSIS — L739 Follicular disorder, unspecified: Secondary | ICD-10-CM

## 2015-07-18 MED ORDER — CEPHALEXIN 500 MG PO CAPS: 500.0000 mg | ORAL_CAPSULE | Freq: Three times a day (TID) | ORAL | Status: DC

## 2015-07-18 MED ORDER — FLUCONAZOLE 150 MG PO TABS: 150.0000 mg | ORAL_TABLET | Freq: Once | ORAL | Status: DC

## 2015-07-18 NOTE — Patient Instructions (Signed)

## 2015-07-18 NOTE — Progress Notes (Signed)
Patient ID: Kristen Mahoney, female   DOB: 07/05/1946, 69 y.o.   MRN: [id number] Resents with complaint of painful cyst between breasts, had drained a yellow material with odor no longer draining. Noticed about a week ago. Denies fever, or any other problems. Leaving on vacation  in several days. Diabetes/hypertension/hypercholesterolemia-primary care manages  Exam: Appears well. Breast exam in sitting and lying position 3 cm indurated tender superficial cystic area between breasts on sternum. No drainage, tender with slight pressure.  Infected sebaceous cyst  Plan: Keflex 500 mg 3 times daily for 7 days. Warm soaks, Neosporin topically twice daily. Diflucan 150 by mouth 1 dose if vaginal itching. Instructed to call if no relief of symptoms.

## 2015-07-18 NOTE — Telephone Encounter (Signed)
Pt called at 5pm to MD on call and stated that prescriptions werent sent to pharmacy.  I called Diflucan and Keflex from todays visit to [facility]. KW CMA

## 2015-07-21 ENCOUNTER — Telehealth: Payer: Self-pay | Admitting: *Deleted

## 2015-07-21 MED ORDER — DOXYCYCLINE HYCLATE 100 MG PO CAPS: 100.0000 mg | ORAL_CAPSULE | Freq: Two times a day (BID) | ORAL | Status: DC

## 2015-07-21 NOTE — Telephone Encounter (Signed)
If she would need a follow up appointment with a provider it sounds like she would probably need to see a surgeon to consider I&D.  At this point I would recommend warm compresses over the area and doxycycline 100 mg twice a day 7 days. Finish her course of Keflex but at the doxycycline which may cover a different type of bacteria. If the area persists or certainly seems to be getting worse then she will need to see a Education officer, environmental.

## 2015-07-21 NOTE — Telephone Encounter (Signed)
Pt informed with the below note, Rx sent. 

## 2015-07-21 NOTE — Telephone Encounter (Signed)
(  You are back up MD) pt called to follow up from [place] with Izora Gala on 07/18/15 cyst between breasts. Taking Keflex 500 mg 3 times daily for 7 days, states odor still there, feels sore, no pain, no fever, has some red watery drainage. Pt will be leaving town tomorrow for 1 week, asked if OV needed again today or continue taking medication. Please advise

## 2015-08-12 ENCOUNTER — Ambulatory Visit (INDEPENDENT_AMBULATORY_CARE_PROVIDER_SITE_OTHER): Payer: Medicare Other | Admitting: Women's Health

## 2015-08-12 ENCOUNTER — Encounter: Payer: Self-pay | Admitting: Women's Health

## 2015-08-12 VITALS — BP 128/80 | Ht 62.0 in | Wt 161.0 lb

## 2015-08-12 DIAGNOSIS — N898 Other specified noninflammatory disorders of vagina: Secondary | ICD-10-CM

## 2015-08-12 DIAGNOSIS — A5901 Trichomonal vulvovaginitis: Secondary | ICD-10-CM

## 2015-08-12 DIAGNOSIS — R350 Frequency of micturition: Secondary | ICD-10-CM | POA: Diagnosis not present

## 2015-08-12 LAB — URINALYSIS W MICROSCOPIC + REFLEX CULTURE
Bilirubin Urine: NEGATIVE
CASTS: NONE SEEN [LPF]
Crystals: NONE SEEN [HPF]
Glucose, UA: NEGATIVE
Ketones, ur: NEGATIVE
NITRITE: NEGATIVE
PH: 5.5 (ref 5.0–8.0)
YEAST: NONE SEEN [HPF]

## 2015-08-12 LAB — WET PREP FOR TRICH, YEAST, CLUE: YEAST WET PREP: NONE SEEN

## 2015-08-12 MED ORDER — METRONIDAZOLE 500 MG PO TABS: ORAL_TABLET | ORAL | Status: DC

## 2015-08-12 NOTE — Patient Instructions (Signed)

## 2015-08-12 NOTE — Progress Notes (Signed)
Patient ID: Kristen Mahoney, female   DOB: [birth year], 69 y.o.   MRN: [id number] Presents with 2 issues. States has had a persistent  cyst between her breasts that drains on occasion. Also having increased discharge with irritation and itching. Increased urinary frequency without pain or burning. Used Diflucan with no relief. Hysterectomy/no HRT.  Exam: Appears well. 1 cm sebaceous cyst between the breasts on the sternum. With slight pressure large amount of a white pink dry material exuded, small amount of Neosporin applied. External genitalia within normal limits, excoriation noted at introitus, speculum exam vaginal walls erythematous, discharge with odor, wet prep positive for Trichomonas. GC/Chlamydia from urine pending. UA: +3 leukocytes, packed with WBCs, many bacteria, few Trichomonas.  Trichomonas Sebaceous cyst  Plan: Flagyl 2 g by mouth 1 dose, alcohol precautions reviewed, refill for husband. Reviewed sexually transmitted, reviewed importance of discussing with husband and they fullness. Encouraged to speak with counselor, states has a name has not scheduled an appointment yet. Urine culture pending.

## 2015-08-13 LAB — URINE CULTURE: Colony Count: 25000

## 2015-08-13 LAB — GC/CHLAMYDIA PROBE AMP, URINE
CHLAMYDIA, SWAB/URINE, PCR: NEGATIVE
GC PROBE AMP, URINE: NEGATIVE

## 2015-10-27 ENCOUNTER — Other Ambulatory Visit: Payer: Self-pay | Admitting: General Surgery

## 2015-11-07 ENCOUNTER — Encounter: Payer: Self-pay | Admitting: Women's Health

## 2015-11-07 ENCOUNTER — Ambulatory Visit (INDEPENDENT_AMBULATORY_CARE_PROVIDER_SITE_OTHER): Payer: Medicare Other | Admitting: Women's Health

## 2015-11-07 VITALS — BP 124/80 | Ht 62.0 in | Wt 160.0 lb

## 2015-11-07 DIAGNOSIS — M899 Disorder of bone, unspecified: Secondary | ICD-10-CM | POA: Diagnosis not present

## 2015-11-07 DIAGNOSIS — Z13820 Encounter for screening for osteoporosis: Secondary | ICD-10-CM

## 2015-11-07 DIAGNOSIS — N760 Acute vaginitis: Secondary | ICD-10-CM

## 2015-11-07 DIAGNOSIS — M8580 Other specified disorders of bone density and structure, unspecified site: Secondary | ICD-10-CM

## 2015-11-07 DIAGNOSIS — B009 Herpesviral infection, unspecified: Secondary | ICD-10-CM | POA: Diagnosis not present

## 2015-11-07 DIAGNOSIS — A499 Bacterial infection, unspecified: Secondary | ICD-10-CM | POA: Diagnosis not present

## 2015-11-07 DIAGNOSIS — N898 Other specified noninflammatory disorders of vagina: Secondary | ICD-10-CM | POA: Diagnosis not present

## 2015-11-07 DIAGNOSIS — B9689 Other specified bacterial agents as the cause of diseases classified elsewhere: Secondary | ICD-10-CM

## 2015-11-07 LAB — WET PREP FOR TRICH, YEAST, CLUE
TRICH WET PREP: NONE SEEN
WBC, Wet Prep HPF POC: NONE SEEN
YEAST WET PREP: NONE SEEN

## 2015-11-07 MED ORDER — METRONIDAZOLE 0.75 % VA GEL: VAGINAL | Status: DC

## 2015-11-07 MED ORDER — ACYCLOVIR 200 MG PO CAPS: ORAL_CAPSULE | ORAL | Status: DC

## 2015-11-07 NOTE — Progress Notes (Signed)
Kristen Mahoney [birth year] [id number]    History:    Presents for breast and pelvic exam. Normal Pap and mammogram history. 03/2013 negative colonoscopy, father history of colon cancer. Hysterectomy on no HRT. Denies vaginal discharge, history of Trichomonas/husband infidelity. Has had both  Pneumovax and Zostavax.  Past medical history, past surgical history, family history and social history were all reviewed and documented in the EPIC chart. Retired, has 2 grown children both doing well. Has had numerous rectal surgeries in the past.  ROS:  A ROS was performed and pertinent positives and negatives are included.  Exam:  Filed Vitals:   11/07/15 1408  BP: 124/80    General appearance:  Normal Thyroid:  Symmetrical, normal in size, without palpable masses or nodularity. Respiratory  Auscultation:  Clear without wheezing or rhonchi Cardiovascular  Auscultation:  Regular rate, without rubs, murmurs or gallops  Edema/varicosities:  Not grossly evident Abdominal  Soft,nontender, without masses, guarding or rebound.  Liver/spleen:  No organomegaly noted  Hernia:  None appreciated  Skin  Inspection:  Grossly normal   Breasts: Examined lying and sitting. Inclusion cyst removed Dr. Donne Hazel stitches intact    Right: Without masses, retractions, discharge or axillary adenopathy.     Left: Without masses, retractions, discharge or axillary adenopathy. Gentitourinary   Inguinal/mons:  Normal without inguinal adenopathy  External genitalia:  Normal  BUS/Urethra/Skene's glands:  Normal  Vagina:  Wet prep positive for moderate clues, TNTC bacteria  Cervix:  And uterus absent  Adnexa/parametria:     Rt: Without masses or tenderness.   Lt: Without masses or tenderness.  Anus and perineum: Normal  Digital rectal exam: Normal sphincter tone without palpated masses or tenderness  Assessment/Plan:  69 y.o. MWF G2 P2 for breast and pelvic exam with no complaints.  Postmenopausal TVH fibroids  no HRT Bacteria vaginosis Diabetes/hypertension/hypercholesterolemia-primary care manages labs and meds 09/2015 inclusion cyst sternum has follow-up scheduled HSV rare outbreaks  Plan: MetroGel vaginal cream 1 applicator at bedtime 5, call if no relief of discharge. DEXA overdue, instructed to schedule. Home safety, fall prevention and importance of weightbearing exercise reviewed. SBE's, continue annual screening mammogram. Vitamin D 2000 daily encouraged. Acyclovir 200 mg by mouth 5 times daily when necessary, prescription, proper use given and reviewed.  [name] WHNP, 3:00 PM 11/07/2015

## 2015-11-07 NOTE — Patient Instructions (Signed)

## 2015-12-09 ENCOUNTER — Ambulatory Visit (INDEPENDENT_AMBULATORY_CARE_PROVIDER_SITE_OTHER): Payer: Medicare Other

## 2015-12-09 ENCOUNTER — Other Ambulatory Visit: Payer: Self-pay | Admitting: Women's Health

## 2015-12-09 DIAGNOSIS — Z780 Asymptomatic menopausal state: Secondary | ICD-10-CM

## 2015-12-09 DIAGNOSIS — M8580 Other specified disorders of bone density and structure, unspecified site: Secondary | ICD-10-CM

## 2015-12-09 DIAGNOSIS — Z13820 Encounter for screening for osteoporosis: Secondary | ICD-10-CM

## 2016-01-28 ENCOUNTER — Telehealth: Payer: Self-pay

## 2016-01-28 NOTE — Telephone Encounter (Signed)
Which meds does she need  Refills on?

## 2016-01-28 NOTE — Telephone Encounter (Signed)
Omperazole 20 mg BID

## 2016-01-28 NOTE — Telephone Encounter (Signed)
Former Dr Olevia Perches pt. Last office visit was in 2015. She is scheduled to come into clinic on 02-18-2016. Can she have refills until follow up? Please advise.

## 2016-01-29 MED ORDER — OMEPRAZOLE 20 MG PO CPDR: DELAYED_RELEASE_CAPSULE | ORAL | Status: DC

## 2016-01-29 NOTE — Telephone Encounter (Signed)
Ok to refill 

## 2016-01-29 NOTE — Telephone Encounter (Signed)
Rx refilled.

## 2016-02-18 ENCOUNTER — Ambulatory Visit (INDEPENDENT_AMBULATORY_CARE_PROVIDER_SITE_OTHER): Payer: Medicare Other | Admitting: Gastroenterology

## 2016-02-18 ENCOUNTER — Encounter: Payer: Self-pay | Admitting: Gastroenterology

## 2016-02-18 VITALS — BP 110/72 | HR 88 | Ht 62.0 in | Wt 160.1 lb

## 2016-02-18 DIAGNOSIS — K5900 Constipation, unspecified: Secondary | ICD-10-CM | POA: Diagnosis not present

## 2016-02-18 DIAGNOSIS — R159 Full incontinence of feces: Secondary | ICD-10-CM | POA: Diagnosis not present

## 2016-02-18 NOTE — Patient Instructions (Signed)
Your appointment is scheduled with Kristen Mahoney at [facility] on 03/22/2016 at 3:40pm. Please arrive 15 minutes early  ([place]) Follow up in 1 year

## 2016-02-18 NOTE — Progress Notes (Signed)
Kristen Mahoney    [id number]    [birth year]  Primary Care Physician:Kristen R Rankins, MD  Referring Physician: [name], MD [place], [place] [address]  Chief complaint: IBS-D  HPI: 70 year old African American female, retired Pharmacist, hospital, with irritable bowel syndrome with predominant diarrhea. She has a history of rectovaginal fistula. She has had several rectal operations resulting in an incompetent rectal sphincter. She has a positive family history of colon cancer in her father and has undergone multiple colonoscopies. Per Dr Kristen Mahoney on last colonoscopy in April 2014 showed edematous tissue in the rectal ampulla with spasm in the sigmoid colon and partial prolapse of the rectal mucosa which causes intermittent bleeding. She has been having diarrheal stools. The stools have become more solid since we added she started colestipol 2 g daily several days ago, and levsin 0.125 mg when necessary. She continues to have intermittent seepage with any sensation. She has chronic back pain with spondylosis L3-L5.    Outpatient Encounter Prescriptions as of 02/18/2016  Medication Sig  . acyclovir (ZOVIRAX) 200 MG capsule Take 5 times a day as needed  . aspirin 81 MG chewable tablet Chew 81 mg by mouth at bedtime.   [name] Kitchen atorvastatin (LIPITOR) 10 MG tablet Take 10 mg by mouth daily.  . Cholecalciferol (VITAMIN D PO) Take 1 tablet by mouth daily.   . citalopram (CELEXA) 20 MG tablet Take 20 mg by mouth daily.    [name] Kitchen glimepiride (AMARYL) 2 MG tablet   . hyoscyamine (LEVSIN SL) 0.125 MG SL tablet PLACE 1 TABLET (0.125 MG TOTAL) UNDER THE TONGUE EVERY 6 (SIX) HOURS AS NEEDED FOR CRAMPING.  . meclizine (ANTIVERT) 12.5 MG tablet Take 12.5 mg by mouth 3 (three) times daily as needed for dizziness.  . metFORMIN (GLUCOPHAGE) 500 MG tablet Take 1,000 mg by mouth 2 (two) times daily with a meal.   . metoprolol succinate (TOPROL XL) 50 MG 24 hr tablet Take 1 tablet (50 mg total) by  mouth daily. Take with or immediately following a meal.  . MICRONIZED COLESTIPOL HCL 1 G tablet TAKE 2 TABLETS BY MOUTH EVERY DAY  . Multiple Vitamins-Minerals (ICAPS MV PO) Take 1 capsule by mouth daily.  . Naproxen Sodium (ALEVE PO) Take 220 tablets by mouth as needed.  [name] Kitchen omeprazole (PRILOSEC) 20 MG capsule TAKE 1 CAPSULE TWICE A DAY EVERY DAY, NOT AS NEEDED  . Vitamin D, Cholecalciferol, 400 units TABS Take by mouth.  [name] Kitchen VITAMIN E PO Take 1 tablet by mouth daily.   . [DISCONTINUED] cyclobenzaprine (FLEXERIL) 10 MG tablet Take 10 mg by mouth daily. 1.5 to 2 tablets  . [DISCONTINUED] doxycycline (VIBRAMYCIN) 100 MG capsule Take 1 capsule (100 mg total) by mouth 2 (two) times daily.  . [DISCONTINUED] meloxicam (MOBIC) 15 MG tablet Take 15 mg by mouth daily.  . [DISCONTINUED] metroNIDAZOLE (METROGEL VAGINAL) 0.75 % vaginal gel 1 applicator per vagina at HS x 5  . [DISCONTINUED] tobramycin-dexamethasone (TOBRADEX) ophthalmic solution 1 drop every 4 (four) hours while awake.  . [DISCONTINUED] valACYclovir (VALTREX) 500 MG tablet Take twice daily 3-5 as needed   No facility-administered encounter medications on file as of 02/18/2016.    Allergies as of 02/18/2016 - Review Complete 02/18/2016  Allergen Reaction Noted  . Ciprofloxacin Rash 09/04/2009  . Morphine and related Itching 05/07/2011    Past Medical History  Diagnosis Date  . Herpes progenitalis   . Urinary, incontinence, stress female   .  Diabetes mellitus   . Hypertension   . Benign breast cyst in female     PATIENT HAS HISTORY OF BREAST CYSTS  . Kidney stone   . Vertigo   . OSA on CPAP   . Cancer ([place])     Basal cell    Past Surgical History  Procedure Laterality Date  . Rectovaginal fistula closure    . Cholecystectomy  2003  . Knee arthroscopy      right  . Breast cystectomy      right  . Ear cystectomy    . Basal cell excised    . Abdominal hysterectomy  1986    leiomyomata    Family History  Problem  Relation Age of Onset  . Diabetes Mother   . Diabetes Father   . Colon cancer Father   . Heart disease Father   . Hypertension Father   . Diabetes Sister   . Hypertension Sister   . Diabetes Brother   . Colon polyps Brother   . Breast cancer Paternal Aunt     Age 27's  . Colon polyps Brother   . Heart disease Brother   . Stroke Brother   . Hyperlipidemia Brother   . Colon polyps Sister   . Diabetes Sister   . Hypertension Sister   . Heart disease Sister   . Esophageal cancer Neg Hx   . Rectal cancer Neg Hx   . Stomach cancer Neg Hx     Social History   Social History  . Marital Status: Married    Spouse Name: N/A  . Number of Children: 2  . Years of Education: N/A   Occupational History  . Retired    Social History Main Topics  . Smoking status: Former Research scientist (life sciences)  . Smokeless tobacco: Never Used  . Alcohol Use: Yes     Comment: occasional Rare  . Drug Use: No  . Sexual Activity: Yes    Birth Control/ Protection: Surgical   Other Topics Concern  . Not on file   Social History Narrative   Denies caffeine use.      Review of systems: Review of Systems  Constitutional: Negative for fever and chills.  HENT: Negative.   Eyes: Negative for blurred vision.  Respiratory: Negative for cough, shortness of breath and wheezing.   Cardiovascular: Negative for chest pain and palpitations.  Gastrointestinal: as per HPI Genitourinary: Negative for dysuria, urgency, frequency and hematuria.  Musculoskeletal: Negative for myalgias, back pain and joint pain.  Skin: Negative for itching and rash.  Neurological: Negative for dizziness, tremors, focal weakness, seizures and loss of consciousness.  Endo/Heme/Allergies: Negative for environmental allergies.  Psychiatric/Behavioral: Negative for depression, suicidal ideas and hallucinations.  All other systems reviewed and are negative.   Physical Exam: Filed Vitals:   02/18/16 0956  BP: 110/72  Pulse: 88   Gen:      No  acute distress HEENT:  EOMI, sclera anicteric Neck:     No masses; no thyromegaly Lungs:    Clear to auscultation bilaterally; normal respiratory effort CV:         Regular rate and rhythm; no murmurs Abd:      + bowel sounds; soft, non-tender; no palpable masses, no distension Ext:    No edema; adequate peripheral perfusion Skin:      Warm and dry; no rash Neuro: alert and oriented x 3 Psych: normal mood and affect Rectal exam: weak anal sphincter tone, no anal fissure or external hemorrhoids, rectal prolapse  on bear down and ?rectocele Anoscopy: Small internal hemorrhoids, no active bleeding, normal dentate line, no visible nodules   Data Reviewed:  COLON, RANDOM BIOPSIES: BENIGN COLONIC MUCOSA. NO ACTIVE INFLAMMATION, MICROSCOPIC COLITIS OR GRANULOMAS.  COMMENT There is colorectal mucosa with normal crypt architecture and no objective increase in inflammation. No active inflammation, microscopic colitis, collagenous colitis or significant chronic changes identified. No hyperplastic or adenomatous changes are seen, and there is no evidence of malignancy.   Assessment and Plan/Recommendations: 77 yr F with h/o IBS and chronic back pain with spondylosis with fecal incontinence. She has h/o recto vaginal fistula that was repaired. Weak anal sphincter on exam Advised patient to increase fiber intake to bulk stool Continue Levsin as needed Will refer to Dr Leighton Ruff to see patient is a candidate for interstim placement for fecal incontinence Due for recall colonoscopy in 2024  K. Denzil Magnuson , MD [phone] Mon-Fri 8a-5p [phone] after 5p, weekends, holidays

## 2016-03-01 DIAGNOSIS — G4733 Obstructive sleep apnea (adult) (pediatric): Secondary | ICD-10-CM | POA: Diagnosis not present

## 2016-03-01 DIAGNOSIS — M1990 Unspecified osteoarthritis, unspecified site: Secondary | ICD-10-CM | POA: Diagnosis not present

## 2016-03-01 DIAGNOSIS — E119 Type 2 diabetes mellitus without complications: Secondary | ICD-10-CM | POA: Diagnosis not present

## 2016-03-01 DIAGNOSIS — J302 Other seasonal allergic rhinitis: Secondary | ICD-10-CM | POA: Diagnosis not present

## 2016-03-01 DIAGNOSIS — I10 Essential (primary) hypertension: Secondary | ICD-10-CM | POA: Diagnosis not present

## 2016-03-01 DIAGNOSIS — E785 Hyperlipidemia, unspecified: Secondary | ICD-10-CM | POA: Diagnosis not present

## 2016-03-08 DIAGNOSIS — E119 Type 2 diabetes mellitus without complications: Secondary | ICD-10-CM | POA: Diagnosis not present

## 2016-03-11 DIAGNOSIS — E119 Type 2 diabetes mellitus without complications: Secondary | ICD-10-CM | POA: Diagnosis not present

## 2016-03-22 ENCOUNTER — Telehealth: Payer: Self-pay | Admitting: Gastroenterology

## 2016-03-22 DIAGNOSIS — K623 Rectal prolapse: Secondary | ICD-10-CM | POA: Diagnosis not present

## 2016-03-22 DIAGNOSIS — R152 Fecal urgency: Secondary | ICD-10-CM | POA: Diagnosis not present

## 2016-03-23 DIAGNOSIS — E119 Type 2 diabetes mellitus without complications: Secondary | ICD-10-CM | POA: Diagnosis not present

## 2016-03-23 DIAGNOSIS — H16223 Keratoconjunctivitis sicca, not specified as Sjogren's, bilateral: Secondary | ICD-10-CM | POA: Diagnosis not present

## 2016-03-23 DIAGNOSIS — E785 Hyperlipidemia, unspecified: Secondary | ICD-10-CM | POA: Diagnosis not present

## 2016-03-23 DIAGNOSIS — Z7984 Long term (current) use of oral hypoglycemic drugs: Secondary | ICD-10-CM | POA: Diagnosis not present

## 2016-03-23 DIAGNOSIS — H2513 Age-related nuclear cataract, bilateral: Secondary | ICD-10-CM | POA: Diagnosis not present

## 2016-03-23 DIAGNOSIS — G4730 Sleep apnea, unspecified: Secondary | ICD-10-CM | POA: Diagnosis not present

## 2016-03-23 NOTE — Telephone Encounter (Signed)
[facility] notified.

## 2016-03-23 NOTE — Telephone Encounter (Signed)
Done. Thanks.

## 2016-03-24 ENCOUNTER — Telehealth: Payer: Self-pay | Admitting: Neurology

## 2016-03-24 DIAGNOSIS — Z9989 Dependence on other enabling machines and devices: Secondary | ICD-10-CM

## 2016-03-24 DIAGNOSIS — G4733 Obstructive sleep apnea (adult) (pediatric): Secondary | ICD-10-CM

## 2016-03-24 NOTE — Telephone Encounter (Signed)
Pt returned Kristen's call and msg was relayed. However pt said [facility] told her order had not been rec'd. msg relayed to pt it could take a couple of days for order to be processed but she was adamant that the order had not been rec'd.

## 2016-03-24 NOTE — Telephone Encounter (Signed)
I spoke to pt and advised her that an order was generated and sent to [facility] but it may take them a couple days to process the order. Pt verbalized understanding. I advised pt to call [facility] at [phone] for further concerns. Pt verbalized understanding.

## 2016-03-24 NOTE — Telephone Encounter (Signed)
Patient called, she received a message from someone that she accidentally erased regarding the need for a new prescription for CPAP supplies. DME provider is [facility] in [place]. Patient is planning on going to daughter's in [place] later today, wants to get this taken care of before she leaves.

## 2016-03-24 NOTE — Telephone Encounter (Signed)
I have already confirmed with [facility] that they received the order. As I advised pt previously today, it does take [facility] a couple days to process the order. I called pt again to discuss. If pt calls back, please advise her of this information.

## 2016-03-24 NOTE — Telephone Encounter (Signed)
Pt called back to say [facility] did not receive any information. She wanted to give [place] a different number : [phone] fax # for [facility].

## 2016-03-29 DIAGNOSIS — G4733 Obstructive sleep apnea (adult) (pediatric): Secondary | ICD-10-CM | POA: Diagnosis not present

## 2016-04-02 DIAGNOSIS — Z1231 Encounter for screening mammogram for malignant neoplasm of breast: Secondary | ICD-10-CM | POA: Diagnosis not present

## 2016-04-02 DIAGNOSIS — Z803 Family history of malignant neoplasm of breast: Secondary | ICD-10-CM | POA: Diagnosis not present

## 2016-04-16 ENCOUNTER — Encounter: Payer: Self-pay | Admitting: Women's Health

## 2016-04-18 DIAGNOSIS — J069 Acute upper respiratory infection, unspecified: Secondary | ICD-10-CM | POA: Diagnosis not present

## 2016-04-18 DIAGNOSIS — R05 Cough: Secondary | ICD-10-CM | POA: Diagnosis not present

## 2016-04-26 DIAGNOSIS — K623 Rectal prolapse: Secondary | ICD-10-CM | POA: Diagnosis not present

## 2016-05-06 ENCOUNTER — Other Ambulatory Visit: Payer: Self-pay

## 2016-05-06 ENCOUNTER — Telehealth: Payer: Self-pay | Admitting: Gastroenterology

## 2016-05-06 MED ORDER — HYDROCORTISONE 2.5 % RE CREA: 1.0000 "application " | TOPICAL_CREAM | Freq: Two times a day (BID) | RECTAL | Status: DC

## 2016-05-06 MED ORDER — HYOSCYAMINE SULFATE 0.125 MG SL SUBL: 0.1250 mg | SUBLINGUAL_TABLET | Freq: Four times a day (QID) | SUBLINGUAL | Status: DC | PRN

## 2016-05-06 NOTE — Telephone Encounter (Signed)
Very difficult to determine what the patient's symptoms are. 20 minute phone call. She has gas. She has intestinal gas and "the stomach gas" also. But her main concern is the intestinal gas. Then she states she has rectal burning. After a lot of questions, she states she has some burning around the rectum that comes and goes, is unrelated to bowel movements and she on occasion sees a "dab of blood". She thinks she has had this before, but she doesn't know if she was treated with a pill, a suppository or a cream. She is certain Dr Olevia Perches treated her for this before. Then she asks for a refill of Levsin SL that she refers to as the pill she puts under her tongue for stomach cramps.  So, can she have some anal pram (on her med list as discontinued) and refill of the Levbid SL?

## 2016-05-06 NOTE — Telephone Encounter (Signed)
Patient is aware of the prescriptions

## 2016-05-06 NOTE — Telephone Encounter (Signed)
Ok to refill Levbid and analpram

## 2016-05-07 ENCOUNTER — Telehealth: Payer: Self-pay | Admitting: Neurology

## 2016-05-07 DIAGNOSIS — E119 Type 2 diabetes mellitus without complications: Secondary | ICD-10-CM | POA: Diagnosis not present

## 2016-05-07 NOTE — Telephone Encounter (Signed)
I believe her CPAP is from 2008 and therefore no downloadable.  I spoke to her, indeed the machine is 71 years old, she gets new supplies, she does have a chip card. I asked her to bring it for downloading to [facility] , as we can not longer access these data. CD

## 2016-05-07 NOTE — Telephone Encounter (Signed)
Pt inquiring if she needs to bring CPAP machine to appt on 5/17. Operator called and left msg, advised she did not need to being machine but if there is a chip in the machine to bring that. Advised if there was any discrepancy the RN would call her back on Monday.

## 2016-05-09 ENCOUNTER — Emergency Department (HOSPITAL_COMMUNITY)
Admission: EM | Admit: 2016-05-09 | Discharge: 2016-05-09 | Disposition: A | Discharge status: Home / Self Care | Payer: Medicare Other | Attending: Emergency Medicine | Admitting: Emergency Medicine

## 2016-05-09 ENCOUNTER — Encounter (HOSPITAL_COMMUNITY): Payer: Self-pay | Admitting: *Deleted

## 2016-05-09 ENCOUNTER — Emergency Department (HOSPITAL_COMMUNITY): Payer: Medicare Other

## 2016-05-09 DIAGNOSIS — G4733 Obstructive sleep apnea (adult) (pediatric): Secondary | ICD-10-CM | POA: Diagnosis not present

## 2016-05-09 DIAGNOSIS — N201 Calculus of ureter: Secondary | ICD-10-CM | POA: Diagnosis not present

## 2016-05-09 DIAGNOSIS — Z7984 Long term (current) use of oral hypoglycemic drugs: Secondary | ICD-10-CM | POA: Diagnosis not present

## 2016-05-09 DIAGNOSIS — Z79899 Other long term (current) drug therapy: Secondary | ICD-10-CM | POA: Insufficient documentation

## 2016-05-09 DIAGNOSIS — I10 Essential (primary) hypertension: Secondary | ICD-10-CM | POA: Insufficient documentation

## 2016-05-09 DIAGNOSIS — N200 Calculus of kidney: Secondary | ICD-10-CM | POA: Insufficient documentation

## 2016-05-09 DIAGNOSIS — E119 Type 2 diabetes mellitus without complications: Secondary | ICD-10-CM | POA: Diagnosis not present

## 2016-05-09 DIAGNOSIS — R109 Unspecified abdominal pain: Secondary | ICD-10-CM | POA: Diagnosis present

## 2016-05-09 DIAGNOSIS — Z8619 Personal history of other infectious and parasitic diseases: Secondary | ICD-10-CM | POA: Insufficient documentation

## 2016-05-09 DIAGNOSIS — Z7982 Long term (current) use of aspirin: Secondary | ICD-10-CM | POA: Diagnosis not present

## 2016-05-09 DIAGNOSIS — Z87891 Personal history of nicotine dependence: Secondary | ICD-10-CM | POA: Diagnosis not present

## 2016-05-09 DIAGNOSIS — Z9981 Dependence on supplemental oxygen: Secondary | ICD-10-CM | POA: Diagnosis not present

## 2016-05-09 DIAGNOSIS — N83201 Unspecified ovarian cyst, right side: Secondary | ICD-10-CM | POA: Insufficient documentation

## 2016-05-09 DIAGNOSIS — N1330 Unspecified hydronephrosis: Secondary | ICD-10-CM | POA: Diagnosis not present

## 2016-05-09 DIAGNOSIS — N202 Calculus of kidney with calculus of ureter: Secondary | ICD-10-CM | POA: Diagnosis not present

## 2016-05-09 DIAGNOSIS — Z85828 Personal history of other malignant neoplasm of skin: Secondary | ICD-10-CM | POA: Insufficient documentation

## 2016-05-09 LAB — URINE MICROSCOPIC-ADD ON

## 2016-05-09 LAB — URINALYSIS, ROUTINE W REFLEX MICROSCOPIC
BILIRUBIN URINE: NEGATIVE
Glucose, UA: NEGATIVE mg/dL
Ketones, ur: 15 mg/dL — AB
Nitrite: NEGATIVE
Protein, ur: 30 mg/dL — AB
SPECIFIC GRAVITY, URINE: 1.023 (ref 1.005–1.030)
pH: 6 (ref 5.0–8.0)

## 2016-05-09 LAB — LIPASE, BLOOD: Lipase: 51 U/L (ref 11–51)

## 2016-05-09 LAB — CBC
HCT: 40.4 % (ref 36.0–46.0)
HEMOGLOBIN: 13.2 g/dL (ref 12.0–15.0)
MCH: 29.6 pg (ref 26.0–34.0)
MCHC: 32.7 g/dL (ref 30.0–36.0)
MCV: 90.6 fL (ref 78.0–100.0)
PLATELETS: 199 10*3/uL (ref 150–400)
RBC: 4.46 MIL/uL (ref 3.87–5.11)
RDW: 14.7 % (ref 11.5–15.5)
WBC: 13 10*3/uL — ABNORMAL HIGH (ref 4.0–10.5)

## 2016-05-09 LAB — COMPREHENSIVE METABOLIC PANEL
ALBUMIN: 3.8 g/dL (ref 3.5–5.0)
ALK PHOS: 87 U/L (ref 38–126)
ALT: 16 U/L (ref 14–54)
AST: 23 U/L (ref 15–41)
Anion gap: 15 (ref 5–15)
BUN: 17 mg/dL (ref 6–20)
CALCIUM: 9.5 mg/dL (ref 8.9–10.3)
CO2: 21 mmol/L — AB (ref 22–32)
CREATININE: 0.99 mg/dL (ref 0.44–1.00)
Chloride: 106 mmol/L (ref 101–111)
GFR calc Af Amer: >60 mL/min (ref >60)
GFR calc non Af Amer: 56 mL/min — ABNORMAL LOW (ref >60)
GLUCOSE: 218 mg/dL — AB (ref 65–99)
Potassium: 3.8 mmol/L (ref 3.5–5.1)
SODIUM: 142 mmol/L (ref 135–145)
Total Bilirubin: 0.4 mg/dL (ref 0.3–1.2)
Total Protein: 7.4 g/dL (ref 6.5–8.1)

## 2016-05-09 MED ORDER — KETOROLAC TROMETHAMINE 30 MG/ML IJ SOLN
15.0000 mg | Freq: Once | INTRAMUSCULAR | Status: AC
  Administered 2016-05-09: 15 mg via INTRAVENOUS
  Filled 2016-05-09: qty 1

## 2016-05-09 MED ORDER — OXYCODONE-ACETAMINOPHEN 5-325 MG PO TABS: 1.0000 | ORAL_TABLET | Freq: Four times a day (QID) | ORAL | Status: DC | PRN

## 2016-05-09 MED ORDER — SODIUM CHLORIDE 0.9 % IV SOLN
INTRAVENOUS | Status: DC
  Administered 2016-05-09: 06:00:00 via INTRAVENOUS

## 2016-05-09 MED ORDER — DOCUSATE SODIUM 100 MG PO CAPS: 100.0000 mg | ORAL_CAPSULE | Freq: Two times a day (BID) | ORAL | Status: DC

## 2016-05-09 MED ORDER — FENTANYL CITRATE (PF) 100 MCG/2ML IJ SOLN
50.0000 ug | Freq: Once | INTRAMUSCULAR | Status: AC
  Administered 2016-05-09: 50 ug via INTRAVENOUS
  Filled 2016-05-09: qty 2

## 2016-05-09 MED ORDER — ONDANSETRON 4 MG PO TBDP: 4.0000 mg | ORAL_TABLET | Freq: Three times a day (TID) | ORAL | Status: DC | PRN

## 2016-05-09 MED ORDER — ONDANSETRON 4 MG PO TBDP
8.0000 mg | ORAL_TABLET | Freq: Once | ORAL | Status: AC
  Administered 2016-05-09: 8 mg via ORAL
  Filled 2016-05-09: qty 2

## 2016-05-09 NOTE — ED Notes (Signed)
The pt is c/o abd pain all day n v and diarrhea this evening  Vomiting in triage

## 2016-05-09 NOTE — Discharge Instructions (Signed)
You were found to have a 7 x 7 x 9 mm left-sided kidney stone. This will likely not pass this on your own. Please call [facility] Monday morning to schedule an appointment. You will likely need lithotripsy to help get eliminate the stone.  Please stop taking your aspirin until you are instructed to restart it by your urologist.   Gwenlyn Saran also found to have a 2.5 cm right-sided ovarian cyst. You need an ultrasound for further evaluation for this but this can be done as an outpatient. If you began having severe right lower abdominal pain, please return to the hospital.   Dietary Guidelines to Help Prevent Kidney Stones Your risk of kidney stones can be decreased by adjusting the foods you eat. The most important thing you can do is drink enough fluid. You should drink enough fluid to keep your urine clear or pale yellow. The following guidelines provide specific information for the type of kidney stone you have had. GUIDELINES ACCORDING TO TYPE OF KIDNEY STONE Calcium Oxalate Kidney Stones  Reduce the amount of salt you eat. Foods that have a lot of salt cause your body to release excess calcium into your urine. The excess calcium can combine with a substance called oxalate to form kidney stones.  Reduce the amount of animal protein you eat if the amount you eat is excessive. Animal protein causes your body to release excess calcium into your urine. Ask your dietitian how much protein from animal sources you should be eating.  Avoid foods that are high in oxalates. If you take vitamins, they should have less than 500 mg of vitamin C. Your body turns vitamin C into oxalates. You do not need to avoid fruits and vegetables high in vitamin C. Calcium Phosphate Kidney Stones  Reduce the amount of salt you eat to help prevent the release of excess calcium into your urine.  Reduce the amount of animal protein you eat if the amount you eat is excessive. Animal protein causes your body to release  excess calcium into your urine. Ask your dietitian how much protein from animal sources you should be eating.  Get enough calcium from food or take a calcium supplement (ask your dietitian for recommendations). Food sources of calcium that do not increase your risk of kidney stones include:  Broccoli.  Dairy products, such as cheese and yogurt.  Pudding. Uric Acid Kidney Stones  Do not have more than 6 oz of animal protein per day. FOOD SOURCES Animal Protein Sources  Meat (all types).  Poultry.  Eggs.  Fish, seafood. Foods High in [employer] seasonings.  Soy sauce.  Teriyaki sauce.  Cured and processed meats.  Salted crackers and snack foods.  Fast food.  Canned soups and most canned foods. Foods High in Oxalates  Grains:  Amaranth.  Barley.  Grits.  Wheat germ.  Bran.  Buckwheat flour.  All bran cereals.  Pretzels.  Whole wheat bread.  Vegetables:  Beans (wax).  Beets and beet greens.  Collard greens.  Eggplant.  [place]ole.  Leeks.  Okra.  Parsley.  Rutabagas.  Spinach.  Swiss chard.  Tomato paste.  Fried potatoes.  Sweet potatoes.  Fruits:  Red currants.  Figs.  Kiwi.  Rhubarb.  Meat and Other Protein Sources:  Beans (dried).  Soy burgers and other soybean products.  Miso.  Nuts (peanuts, almonds, pecans, cashews, hazelnuts).  Nut butters.  Sesame seeds and tahini (paste made of sesame seeds).  Poppy seeds.  Beverages:  Chocolate drink  mixes.  Soy milk.  Instant iced tea.  Juices made from high-oxalate fruits or vegetables.  Other:  Carob.  Chocolate.  Fruitcake.  Marmalades.   This information is not intended to replace advice given to you by your health care provider. Make sure you discuss any questions you have with your health care provider.   Document Released: 04/09/2011 Document Revised: 12/18/2013 Document Reviewed: 11/09/2013 Elsevier Interactive Patient Education 2016  Elsevier Inc.  Kidney Stones Kidney stones (urolithiasis) are deposits that form inside your kidneys. The intense pain is caused by the stone moving through the urinary tract. When the stone moves, the ureter goes into spasm around the stone. The stone is usually passed in the urine.  CAUSES   A disorder that makes certain neck glands produce too much parathyroid hormone (primary hyperparathyroidism).  A buildup of uric acid crystals, similar to gout in your joints.  Narrowing (stricture) of the ureter.  A kidney obstruction present at birth (congenital obstruction).  Previous surgery on the kidney or ureters.  Numerous kidney infections. SYMPTOMS   Feeling sick to your stomach (nauseous).  Throwing up (vomiting).  Blood in the urine (hematuria).  Pain that usually spreads (radiates) to the groin.  Frequency or urgency of urination. DIAGNOSIS   Taking a history and physical exam.  Blood or urine tests.  CT scan.  Occasionally, an examination of the inside of the urinary bladder (cystoscopy) is performed. TREATMENT   Observation.  Increasing your fluid intake.  Extracorporeal shock wave lithotripsy--This is a noninvasive procedure that uses shock waves to break up kidney stones.  Surgery may be needed if you have severe pain or persistent obstruction. There are various surgical procedures. Most of the procedures are performed with the use of small instruments. Only small incisions are needed to accommodate these instruments, so recovery time is minimized. The size, location, and chemical composition are all important variables that will determine the proper choice of action for you. Talk to your health care provider to better understand your situation so that you will minimize the risk of injury to yourself and your kidney.  HOME CARE INSTRUCTIONS   Drink enough water and fluids to keep your urine clear or pale yellow. This will help you to pass the stone or stone  fragments.  Strain all urine through the provided strainer. Keep all particulate matter and stones for your health care provider to see. The stone causing the pain may be as small as a grain of salt. It is very important to use the strainer each and every time you pass your urine. The collection of your stone will allow your health care provider to analyze it and verify that a stone has actually passed. The stone analysis will often identify what you can do to reduce the incidence of recurrences.  Only take over-the-counter or prescription medicines for pain, discomfort, or fever as directed by your health care provider.  Keep all follow-up visits as told by your health care provider. This is important.  Get follow-up X-rays if required. The absence of pain does not always mean that the stone has passed. It may have only stopped moving. If the urine remains completely obstructed, it can cause loss of kidney function or even complete destruction of the kidney. It is your responsibility to make sure X-rays and follow-ups are completed. Ultrasounds of the kidney can show blockages and the status of the kidney. Ultrasounds are not associated with any radiation and can be performed easily in a matter  of minutes.  Make changes to your daily diet as told by your health care provider. You may be told to:  Limit the amount of salt that you eat.  Eat 5 or more servings of fruits and vegetables each day.  Limit the amount of meat, poultry, fish, and eggs that you eat.  Collect a 24-hour urine sample as told by your health care provider.You may need to collect another urine sample every 6-12 months. SEEK MEDICAL CARE IF:  You experience pain that is progressive and unresponsive to any pain medicine you have been prescribed. SEEK IMMEDIATE MEDICAL CARE IF:   Pain cannot be controlled with the prescribed medicine.  You have a fever or shaking chills.  The severity or intensity of pain increases over  18 hours and is not relieved by pain medicine.  You develop a new onset of abdominal pain.  You feel faint or pass out.  You are unable to urinate.   This information is not intended to replace advice given to you by your health care provider. Make sure you discuss any questions you have with your health care provider.   Document Released: 12/13/2005 Document Revised: 09/03/2015 Document Reviewed: 05/16/2013 Elsevier Interactive Patient Education 2016 Elsevier Inc.   Ovarian Cyst An ovarian cyst is a fluid-filled sac that forms on an ovary. The ovaries are small organs that produce eggs in women. Various types of cysts can form on the ovaries. Most are not cancerous. Many do not cause problems, and they often go away on their own. Some may cause symptoms and require treatment. Common types of ovarian cysts include:  Functional cysts--These cysts may occur every month during the menstrual cycle. This is normal. The cysts usually go away with the next menstrual cycle if the woman does not get pregnant. Usually, there are no symptoms with a functional cyst.  Endometrioma cysts--These cysts form from the tissue that lines the uterus. They are also called "chocolate cysts" because they become filled with blood that turns brown. This type of cyst can cause pain in the lower abdomen during intercourse and with your menstrual period.  Cystadenoma cysts--This type develops from the cells on the outside of the ovary. These cysts can get very big and cause lower abdomen pain and pain with intercourse. This type of cyst can twist on itself, cut off its blood supply, and cause severe pain. It can also easily rupture and cause a lot of pain.  Dermoid cysts--This type of cyst is sometimes found in both ovaries. These cysts may contain different kinds of body tissue, such as skin, teeth, hair, or cartilage. They usually do not cause symptoms unless they get very big.  Theca lutein cysts--These cysts occur  when too much of a certain hormone (human chorionic gonadotropin) is produced and overstimulates the ovaries to produce an egg. This is most common after procedures used to assist with the conception of a baby (in vitro fertilization). CAUSES   Fertility drugs can cause a condition in which multiple large cysts are formed on the ovaries. This is called ovarian hyperstimulation syndrome.  A condition called polycystic ovary syndrome can cause hormonal imbalances that can lead to nonfunctional ovarian cysts. SIGNS AND SYMPTOMS  Many ovarian cysts do not cause symptoms. If symptoms are present, they may include:  Pelvic pain or pressure.  Pain in the lower abdomen.  Pain during sexual intercourse.  Increasing girth (swelling) of the abdomen.  Abnormal menstrual periods.  Increasing pain with menstrual periods.  Stopping  having menstrual periods without being pregnant. DIAGNOSIS  These cysts are commonly found during a routine or annual pelvic exam. Tests may be ordered to find out more about the cyst. These tests may include:  Ultrasound.  X-ray of the pelvis.  CT scan.  MRI.  Blood tests. TREATMENT  Many ovarian cysts go away on their own without treatment. Your health care provider may want to check your cyst regularly for 2-3 months to see if it changes. For women in menopause, it is particularly important to monitor a cyst closely because of the higher rate of ovarian cancer in menopausal women. When treatment is needed, it may include any of the following:  A procedure to drain the cyst (aspiration). This may be done using a long needle and ultrasound. It can also be done through a laparoscopic procedure. This involves using a thin, lighted tube with a tiny camera on the end (laparoscope) inserted through a small incision.  Surgery to remove the whole cyst. This may be done using laparoscopic surgery or an open surgery involving a larger incision in the lower  abdomen.  Hormone treatment or birth control pills. These methods are sometimes used to help dissolve a cyst. HOME CARE INSTRUCTIONS   Only take over-the-counter or prescription medicines as directed by your health care provider.  Follow up with your health care provider as directed.  Get regular pelvic exams and Pap tests. SEEK MEDICAL CARE IF:   Your periods are late, irregular, or painful, or they stop.  Your pelvic pain or abdominal pain does not go away.  Your abdomen becomes larger or swollen.  You have pressure on your bladder or trouble emptying your bladder completely.  You have pain during sexual intercourse.  You have feelings of fullness, pressure, or discomfort in your stomach.  You lose weight for no apparent reason.  You feel generally ill.  You become constipated.  You lose your appetite.  You develop acne.  You have an increase in body and facial hair.  You are gaining weight, without changing your exercise and eating habits.  You think you are pregnant. SEEK IMMEDIATE MEDICAL CARE IF:   You have increasing abdominal pain.  You feel sick to your stomach (nauseous), and you throw up (vomit).  You develop a fever that comes on suddenly.  You have abdominal pain during a bowel movement.  Your menstrual periods become heavier than usual. MAKE SURE YOU:  Understand these instructions.  Will watch your condition.  Will get help right away if you are not doing well or get worse.   This information is not intended to replace advice given to you by your health care provider. Make sure you discuss any questions you have with your health care provider.   Document Released: 12/13/2005 Document Revised: 12/18/2013 Document Reviewed: 08/20/2013 Elsevier Interactive Patient Education [employer].

## 2016-05-09 NOTE — ED Provider Notes (Signed)
TIME SEEN: 4:45 AM  CHIEF COMPLAINT: Left-sided flank pain, nausea, vomiting and diarrhea  HPI: Pt is a 70 y.o. female with history of kidney stones followed by [facility], hypertension, diabetes who presents emergency department with left-sided flank pain, nausea, vomiting and diarrhea that started at 2 PM yesterday. No fevers or chills. Has had gross hematuria. No dysuria. No vaginal bleeding or discharge. Feels exactly like her prior kidney stones. Has had to have lithotripsy in the past. No contacts or recent travel. Has not been on antibiotics.  ROS: See HPI Constitutional: no fever  Eyes: no drainage  ENT: no runny nose   Cardiovascular:  no chest pain  Resp: no SOB  GI: no vomiting GU: no dysuria Integumentary: no rash  Allergy: no hives  Musculoskeletal: no leg swelling  Neurological: no slurred speech ROS otherwise negative  PAST MEDICAL HISTORY/PAST SURGICAL HISTORY:  Past Medical History  Diagnosis Date  . Herpes progenitalis   . Urinary, incontinence, stress female   . Diabetes mellitus   . Hypertension   . Benign breast cyst in female     PATIENT HAS HISTORY OF BREAST CYSTS  . Kidney stone   . Vertigo   . OSA on CPAP   . Cancer ([place])     Basal cell    MEDICATIONS:  Prior to Admission medications   Medication Sig Start Date End Date Taking? Authorizing Provider  acyclovir (ZOVIRAX) 200 MG capsule Take 5 times a day as needed 11/07/15   [name], NP  aspirin 81 MG chewable tablet Chew 81 mg by mouth at bedtime.     Historical Provider, MD  atorvastatin (LIPITOR) 10 MG tablet Take 10 mg by mouth daily.    Historical Provider, MD  Cholecalciferol (VITAMIN D PO) Take 1 tablet by mouth daily.     Historical Provider, MD  citalopram (CELEXA) 20 MG tablet Take 20 mg by mouth daily.      Historical Provider, MD  glimepiride (AMARYL) 2 MG tablet  04/19/15   Historical Provider, MD  hydrocortisone (ANUSOL-HC) 2.5 % rectal cream Place 1 application rectally 2  (two) times daily. 05/06/16   Mauri Pole, MD  hyoscyamine (LEVSIN SL) 0.125 MG SL tablet Take 1 tablet (0.125 mg total) by mouth every 6 (six) hours as needed for cramping. 05/06/16   Mauri Pole, MD  meclizine (ANTIVERT) 12.5 MG tablet Take 12.5 mg by mouth 3 (three) times daily as needed for dizziness.    Historical Provider, MD  metFORMIN (GLUCOPHAGE) 500 MG tablet Take 1,000 mg by mouth 2 (two) times daily with a meal.     Historical Provider, MD  metoprolol succinate (TOPROL XL) 50 MG 24 hr tablet Take 1 tablet (50 mg total) by mouth daily. Take with or immediately following a meal. 12/29/12   Alfonzo Beers, MD  MICRONIZED COLESTIPOL HCL 1 G tablet TAKE 2 TABLETS BY MOUTH EVERY DAY 10/11/14   Lafayette Dragon, MD  Multiple Vitamins-Minerals (ICAPS MV PO) Take 1 capsule by mouth daily.    Historical Provider, MD  Naproxen Sodium (ALEVE PO) Take 220 tablets by mouth as needed.    Historical Provider, MD  omeprazole (PRILOSEC) 20 MG capsule TAKE 1 CAPSULE TWICE A DAY EVERY DAY, NOT AS NEEDED 01/29/16   Mauri Pole, MD  Vitamin D, Cholecalciferol, 400 units TABS Take by mouth.    Historical Provider, MD  VITAMIN E PO Take 1 tablet by mouth daily.     Historical Provider, MD  ALLERGIES:  Allergies  Allergen Reactions  . Ciprofloxacin Rash  . Morphine And Related Itching    SOCIAL HISTORY:  Social History  Substance Use Topics  . Smoking status: Former Research scientist (life sciences)  . Smokeless tobacco: Never Used  . Alcohol Use: Yes     Comment: occasional Rare    FAMILY HISTORY: Family History  Problem Relation Age of Onset  . Diabetes Mother   . Diabetes Father   . Colon cancer Father   . Heart disease Father   . Hypertension Father   . Diabetes Sister   . Hypertension Sister   . Diabetes Brother   . Colon polyps Brother   . Breast cancer Paternal Aunt     Age 24's  . Colon polyps Brother   . Heart disease Brother   . Stroke Brother   . Hyperlipidemia Brother   . Colon polyps  Sister   . Diabetes Sister   . Hypertension Sister   . Heart disease Sister   . Esophageal cancer Neg Hx   . Rectal cancer Neg Hx   . Stomach cancer Neg Hx     EXAM: BP 144/66 mmHg  Pulse 90  Temp(Src) 98.7 F (37.1 C) (Oral)  Resp 18  SpO2 100% CONSTITUTIONAL: Alert and oriented and responds appropriately to questions. Elderly, appears uncomfortable, afebrile HEAD: Normocephalic EYES: Conjunctivae clear, PERRL ENT: normal nose; no rhinorrhea; moist mucous membranes NECK: Supple, no meningismus, no LAD  CARD: RRR; S1 and S2 appreciated; no murmurs, no clicks, no rubs, no gallops RESP: Normal chest excursion without splinting or tachypnea; breath sounds clear and equal bilaterally; no wheezes, no rhonchi, no rales, no hypoxia or respiratory distress, speaking full sentences ABD/GI: Normal bowel sounds; non-distended; soft, non-tender, no rebound, no guarding, no peritoneal signs BACK:  The back appears normal and is non-tender to palpation, there is no CVA tenderness EXT: Normal ROM in all joints; non-tender to palpation; no edema; normal capillary refill; no cyanosis, no calf tenderness or swelling    SKIN: Normal color for age and race; warm; no rash NEURO: Moves all extremities equally, sensation to light touch intact diffusely, cranial nerves II through XII intact PSYCH: The patient's mood and manner are appropriate. Grooming and personal hygiene are appropriate.  MEDICAL DECISION MAKING: Patient here with left-sided flank pain, vomiting and diarrhea. Feels like her prior kidney stones. Has had lithotripsy in the past. Urine shows gross hematuria. She does have some leukocytes but no other obvious sign of infection. No nitrites. Bacteria. She has a mild leukocytosis which appears to be chronic. She is afebrile. We'll obtain CT of her abdomen and pelvis were give IV fluids, pain and nausea medicine.  ED PROGRESS: Patient's CT scan shows a 7 x 7 x 9 mm obstructing proximal left  ureteral stone. She has market hydronephrosis. Also has a right ovarian cyst measuring 2.5 cm. No pain in this area currently. Have discussed this finding with patient and recommend close patient follow-up for an ultrasound.  She does have a OB/GYN for follow-up. Discussed patient's case with Dr. Alinda Money on call for urology. He states that her pain is controlled they can see her as an outpatient early next week for lithotripsy. Patient reports feeling much better after main medication. She is ready for discharge. We'll discharge with Percocet, Colace, Zofran. Have discussed at length return precautions. Have advised her to call urology office tomorrow morning to schedule close follow-up. She is comfortable with this plan.    At this time, I do  not feel there is any life-threatening condition present. I have reviewed and discussed all results (EKG, imaging, lab, urine as appropriate), exam findings with patient. I have reviewed nursing notes and appropriate previous records.  I feel the patient is safe to be discharged home without further emergent workup. Discussed usual and customary return precautions. Patient and family (if present) verbalize understanding and are comfortable with this plan.  Patient will follow-up with their primary care provider. If they do not have a primary care provider, information for follow-up has been provided to them. All questions have been answered.     [place], DO 05/09/16 [phone]

## 2016-05-10 ENCOUNTER — Ambulatory Visit (HOSPITAL_COMMUNITY)
Admission: RE | Admit: 2016-05-10 | Discharge: 2016-05-10 | Disposition: A | Discharge status: Home / Self Care | Payer: Medicare Other | Source: Ambulatory Visit | Attending: Urology | Admitting: Urology

## 2016-05-10 ENCOUNTER — Encounter (HOSPITAL_COMMUNITY): Payer: Self-pay | Admitting: General Practice

## 2016-05-10 ENCOUNTER — Encounter (HOSPITAL_COMMUNITY)
Admission: RE | Disposition: A | Discharge status: Home / Self Care | Payer: Self-pay | Source: Ambulatory Visit | Attending: Urology

## 2016-05-10 ENCOUNTER — Telehealth: Payer: Self-pay | Admitting: *Deleted

## 2016-05-10 ENCOUNTER — Other Ambulatory Visit: Payer: Self-pay | Admitting: Urology

## 2016-05-10 ENCOUNTER — Ambulatory Visit (HOSPITAL_COMMUNITY): Payer: Medicare Other

## 2016-05-10 DIAGNOSIS — I10 Essential (primary) hypertension: Secondary | ICD-10-CM | POA: Diagnosis not present

## 2016-05-10 DIAGNOSIS — Z0000 Encounter for general adult medical examination without abnormal findings: Secondary | ICD-10-CM | POA: Diagnosis not present

## 2016-05-10 DIAGNOSIS — N201 Calculus of ureter: Secondary | ICD-10-CM

## 2016-05-10 DIAGNOSIS — E119 Type 2 diabetes mellitus without complications: Secondary | ICD-10-CM | POA: Insufficient documentation

## 2016-05-10 LAB — GLUCOSE, CAPILLARY: Glucose-Capillary: 97 mg/dL (ref 65–99)

## 2016-05-10 SURGERY — LITHOTRIPSY, ESWL
Anesthesia: LOCAL | Laterality: Left

## 2016-05-10 MED ORDER — DIPHENHYDRAMINE HCL 25 MG PO CAPS
25.0000 mg | ORAL_CAPSULE | ORAL | Status: AC
  Administered 2016-05-10: 25 mg via ORAL
  Filled 2016-05-10: qty 1

## 2016-05-10 MED ORDER — DIAZEPAM 5 MG PO TABS
10.0000 mg | ORAL_TABLET | ORAL | Status: AC
  Administered 2016-05-10: 10 mg via ORAL
  Filled 2016-05-10: qty 2

## 2016-05-10 MED ORDER — SODIUM CHLORIDE 0.9 % IV SOLN
INTRAVENOUS | Status: DC
  Administered 2016-05-10: 17:00:00 via INTRAVENOUS

## 2016-05-10 MED ORDER — CIPROFLOXACIN HCL 500 MG PO TABS
500.0000 mg | ORAL_TABLET | ORAL | Status: DC
  Filled 2016-05-10: qty 1

## 2016-05-10 NOTE — Telephone Encounter (Signed)
Pt called regarding urology referral.  EDCM advised pt to follow instructions of calling [facility].  EDCM provided telephone number.

## 2016-05-10 NOTE — Progress Notes (Signed)
Procedure cancelled, Stone not out of renal shadow, pt. Had toradol and aspirin within last 48 hours, pt. Instructed to call dr. Noland Fordyce office in am to reschedule, pt. Instructed not to take any aspirin until after procedure. Pt. And pt's husband verbalized understanding.

## 2016-05-11 ENCOUNTER — Other Ambulatory Visit: Payer: Self-pay | Admitting: Family Medicine

## 2016-05-11 ENCOUNTER — Other Ambulatory Visit: Payer: Self-pay | Admitting: Urology

## 2016-05-11 ENCOUNTER — Encounter (HOSPITAL_COMMUNITY): Payer: Self-pay | Admitting: *Deleted

## 2016-05-11 DIAGNOSIS — N83201 Unspecified ovarian cyst, right side: Secondary | ICD-10-CM | POA: Diagnosis not present

## 2016-05-12 ENCOUNTER — Ambulatory Visit: Payer: Medicare Other | Admitting: Neurology

## 2016-05-12 ENCOUNTER — Telehealth: Payer: Self-pay

## 2016-05-12 NOTE — Telephone Encounter (Addendum)
Pt called sts she just go up. sts she has kidney stones and cyst on ovary. Said she did not sleep last night. FYI

## 2016-05-12 NOTE — Telephone Encounter (Signed)
Noted, pt has already rescheduled her yearly follow up appt.

## 2016-05-12 NOTE — Telephone Encounter (Signed)
Pt did not show for their appt with Dr. Dohmeier today.  

## 2016-05-12 NOTE — H&P (Signed)
Chief Complaint left flank pain   History of Present Illness Kristen Mahoney is a 70yo seen in consultation for left ureteral calculus. 3 days she developed severe sharp, intermittent, nonradiating left flank pain. She presented to the ER and was diagnosed with a 100mm L UPJ calculus with mild hydro. This is her first stone event. She has associated nausea and vomiting. She does not have associated LUTS. Percocet is improving the pain to 2/10. No other alleviating and exacerbating events. No hematuria   Past Medical History Problems  1. History of Anxiety (Symptom) 2. History of Arthritis 3. History of diabetes mellitus (Z86.39) 4. History of esophageal reflux (Z87.19) 5. History of hypercholesterolemia (Z86.39) 6. History of hypertension (Z86.79) 7. History of malignant melanoma of skin (Z85.820) 8. History of sleep apnea (Z86.69) 9. History of Murmur (R01.1)  Surgical History Problems  1. History of Breast Surgery Puncture Aspiration Of Cyst 2. History of Knee Surgery 3. History of Lithotripsy - Whole Body (Extracorporeal Shock Wave)  Current Meds 1. Acyclovir CAPS;  Therapy: (Recorded:30Nov2007) to Recorded 2. Aspirin TABS;  Therapy: (Recorded:15May2017) to Recorded 3. Caltrate 600+D TABS;  Therapy: (Recorded:15May2017) to Recorded 4. Citalopram Hydrobromide 20 MG Oral Tablet;  Therapy: (Recorded:15May2017) to Recorded 5. Glimepiride 1 MG Oral Tablet;  Therapy: (Recorded:15May2017) to Recorded 6. Hyoscyamine Sulfate 0.125 MG Oral Tablet;  Therapy: (Recorded:15May2017) to Recorded 7. ICaps CAPS;  Therapy: (Recorded:15May2017) to Recorded 8. Lipitor 10 MG Oral Tablet;  Therapy: (Recorded:30Nov2007) to Recorded 9. Lipitor TABS;  Therapy: (Recorded:15May2017) to Recorded 10. MetFORMIN HCl TABS;   Therapy: (Recorded:30Nov2007) to Recorded 11. Metoprolol Succinate ER 25 MG Oral Tablet Extended Release 24 Hour;   Therapy: [id number] to Recorded 12. Omeprazole CPDR;   Therapy:  (Recorded:30Nov2007) to Recorded 13. Percocet 5-325 MG Oral Tablet;   Therapy: (Recorded:15May2017) to Recorded 14. Stool Softener CAPS;   Therapy: (Recorded:15May2017) to Recorded 15. Vitamin D-3 TABS;   Therapy: (Recorded:15May2017) to Recorded 16. Vitamin E TABS;   Therapy: (Recorded:15May2017) to Recorded 17. Zofran 4 MG Oral Tablet;   Therapy: (Recorded:15May2017) to Recorded  Allergies Medication  1. Cipro TABS 2. Morphine Derivatives Denied  3. Aspirin TABS  Family History Problems  1. Family history of Family Health Status Number Of Children   1 son; 1 daughter 2. Family history of Prostate Cancer   FATHER  Social History Problems    Alcohol Use (History)   socially   Caffeine Use   occ.   Exercise Habits   no regular exercise program but has a bike she rides outside 10 min. maybe 1x/week or     so; plans to re-join the [employer] within the next week   Marital History - Currently Married   Occupation:   RETIRED   Tobacco Use   SMOKED ABOUT 5 YEARS, QUIT 20 YEARS AGO  Review of Systems Genitourinary, constitutional, skin, eye, otolaryngeal, hematologic/lymphatic, cardiovascular, pulmonary, endocrine, gastrointestinal, neurological and psychiatric system(s) were reviewed and pertinent findings if present are noted and are otherwise negative.  Genitourinary: nocturia and incontinence.  Gastrointestinal: nausea and vomiting.  Constitutional: feeling tired (fatigue).  Musculoskeletal: back pain, but no joint pain.    Vitals Vital Signs [Data Includes: Last 1 Day]  Recorded: [id number] 01:13PM  Height: 5 ft 2 in Weight: 158 lb  BMI Calculated: 28.9 BSA Calculated: 1.73 Blood Pressure: 135 / 74 Temperature: 98.6 F Heart Rate: 80  Physical Exam Constitutional: Well nourished and well developed.  ENT:. The ears and nose are normal in appearance. The oropharynx is normal.  Neck: The appearance of the neck is normal and no neck mass is present.   Pulmonary: No respiratory distress and normal respiratory rhythm and effort.  Cardiovascular:. The arterial pulses are normal. No peripheral edema.  Abdomen: The abdomen is rounded. The abdomen is soft and nontender. No masses are palpated. No CVA tenderness. No hernias are palpable. No hepatosplenomegaly noted.  Lymphatics: The posterior cervical and anterior cervical nodes are not enlarged or tender.  Skin: Normal skin turgor, no visible rash and normal skin color and pigmentation.  Neuro/Psych:. Mood and affect are appropriate. No focal sensory deficits.    Results/Data  10 May 2016 12:58 PM  UA With REFLEX    COLOR YELLOW     APPEARANCE CLEAR     SPECIFIC GRAVITY 1.030     pH 6.0     GLUCOSE NEGATIVE     BILIRUBIN NEGATIVE     KETONE NEGATIVE     BLOOD NEGATIVE     PROTEIN TRACE     NITRITE NEGATIVE     LEUKOCYTE ESTERASE NEGATIVE     Study: Abdominal Anterior-Posterior Radiograph    Date of Service: 05/10/2016    Indication: ureteral calculus    Bony Structures: Visualized bony structures of the ribs, spine, and pelvis demonstrate no    large lytic or blastic lesions.    Bowel Gas: Visualized bowel gas is non-obstructive appearing.    Rt Kidney: The right kidney is shadow is visualized. no radiodense structures    are identified in the expected location of the kidney and renal pelvis. no radiodense    structures are identified in the expected location of the ureter.    Lt Kidney: The left kidney is shadow is visualized. no radiodense structures are    identified in the expected location of the kidney and renal pelvis. 21mm radiodense    structures are identified in the expected location of the proximal ureter.    Bladder Area / Pelvis: no radiodense structures are identified in the expected location    of the urinary bladder. no densities consistent with phleboliths are visualized.    IMPRESSION: left proximal ureteral  calculus    Nicolette Bang, MD1     1 Amended By: Nicolette Bang; May 12 2016 6:14 PM EST  Assessment Assessed  1. Calculus of left ureter (N20.1)  Plan Calculus of left ureter  1. KUB; Status:Hold For - Date of Service; Requested for:30May2017;  2. Follow-up Week x 2 Office  Follow-up  Status: Hold For - Date of Service  Requested for:  [id number]  1. Schedule for L ESWL   Discussion/Summary We discussed shockwave lithotripsy in detail as well as my "rule of 9s" with stones    &lt;37mm, less than 900 HU, and skin to stone distance &lt;9cm having approximately 90%    treatment success with single session of treatment. We then addressed how stones that are    larger, more dense, and in patients with less favorable anatomy have incrementally    decreased success rates. We discussed risks including, bleeding, infection, hematoma,    loss of kidney, need for staged therapy, need for adjunctive therapy and requirement to    refrain from any anticoagulants, anti-platelet or aspirin-like products peri-procedureally.    After careful consideration, the patient has chosen to proceed.   Signatures Electronically signed by : Nicolette Bang, M.D.; May 12 2016  6:15PM EST Artist)

## 2016-05-13 ENCOUNTER — Ambulatory Visit (HOSPITAL_COMMUNITY)
Admission: RE | Admit: 2016-05-13 | Discharge: 2016-05-13 | Disposition: A | Discharge status: Home / Self Care | Payer: Medicare Other | Source: Ambulatory Visit | Attending: Urology | Admitting: Urology

## 2016-05-13 ENCOUNTER — Ambulatory Visit (HOSPITAL_COMMUNITY): Payer: Medicare Other

## 2016-05-13 ENCOUNTER — Encounter (HOSPITAL_COMMUNITY)
Admission: RE | Disposition: A | Discharge status: Home / Self Care | Payer: Self-pay | Source: Ambulatory Visit | Attending: Urology

## 2016-05-13 ENCOUNTER — Encounter: Payer: Self-pay | Admitting: Neurology

## 2016-05-13 ENCOUNTER — Encounter (HOSPITAL_COMMUNITY): Payer: Self-pay | Admitting: *Deleted

## 2016-05-13 DIAGNOSIS — K219 Gastro-esophageal reflux disease without esophagitis: Secondary | ICD-10-CM | POA: Insufficient documentation

## 2016-05-13 DIAGNOSIS — Z7982 Long term (current) use of aspirin: Secondary | ICD-10-CM | POA: Insufficient documentation

## 2016-05-13 DIAGNOSIS — E7800 Pure hypercholesterolemia, unspecified: Secondary | ICD-10-CM | POA: Diagnosis not present

## 2016-05-13 DIAGNOSIS — N201 Calculus of ureter: Secondary | ICD-10-CM | POA: Diagnosis not present

## 2016-05-13 DIAGNOSIS — Z79899 Other long term (current) drug therapy: Secondary | ICD-10-CM | POA: Insufficient documentation

## 2016-05-13 DIAGNOSIS — E119 Type 2 diabetes mellitus without complications: Secondary | ICD-10-CM | POA: Insufficient documentation

## 2016-05-13 DIAGNOSIS — I10 Essential (primary) hypertension: Secondary | ICD-10-CM | POA: Diagnosis not present

## 2016-05-13 DIAGNOSIS — Z85820 Personal history of malignant melanoma of skin: Secondary | ICD-10-CM | POA: Insufficient documentation

## 2016-05-13 DIAGNOSIS — G4730 Sleep apnea, unspecified: Secondary | ICD-10-CM | POA: Diagnosis not present

## 2016-05-13 DIAGNOSIS — F419 Anxiety disorder, unspecified: Secondary | ICD-10-CM | POA: Diagnosis not present

## 2016-05-13 DIAGNOSIS — Z7984 Long term (current) use of oral hypoglycemic drugs: Secondary | ICD-10-CM | POA: Insufficient documentation

## 2016-05-13 LAB — GLUCOSE, CAPILLARY: Glucose-Capillary: 149 mg/dL — ABNORMAL HIGH (ref 65–99)

## 2016-05-13 SURGERY — LITHOTRIPSY, ESWL
Anesthesia: LOCAL | Laterality: Left

## 2016-05-13 MED ORDER — DIAZEPAM 5 MG PO TABS
10.0000 mg | ORAL_TABLET | ORAL | Status: AC
  Administered 2016-05-13: 10 mg via ORAL
  Filled 2016-05-13: qty 2

## 2016-05-13 MED ORDER — SODIUM CHLORIDE 0.9 % IV SOLN
INTRAVENOUS | Status: DC
  Administered 2016-05-13: 08:00:00 via INTRAVENOUS

## 2016-05-13 MED ORDER — DIPHENHYDRAMINE HCL 25 MG PO CAPS
25.0000 mg | ORAL_CAPSULE | ORAL | Status: AC
  Administered 2016-05-13: 25 mg via ORAL
  Filled 2016-05-13: qty 1

## 2016-05-13 MED ORDER — CIPROFLOXACIN HCL 500 MG PO TABS: 500.0000 mg | ORAL_TABLET | ORAL | Status: DC

## 2016-05-13 MED ORDER — SULFAMETHOXAZOLE-TRIMETHOPRIM 800-160 MG PO TABS
1.0000 | ORAL_TABLET | ORAL | Status: AC
  Administered 2016-05-13: 1 via ORAL
  Filled 2016-05-13 (×2): qty 1

## 2016-05-13 NOTE — Interval H&P Note (Signed)
History and Physical Interval Note:  05/13/2016 8:18 AM  Kristen Mahoney  has presented today for surgery, with the diagnosis of LEFT URETERAL CALCULUS  The various methods of treatment have been discussed with the patient and family. After consideration of risks, benefits and other options for treatment, the patient has consented to  Procedure(s): LEFT EXTRACORPOREAL SHOCK WAVE LITHOTRIPSY (ESWL) (Left) as a surgical intervention .  The patient's history has been reviewed, patient examined, no change in status, stable for surgery.  I have reviewed the patient's chart and labs.  Questions were answered to the patient's satisfaction.     Akisha Sturgill,LES

## 2016-05-13 NOTE — Op Note (Signed)
See scanned operative note for ESWL.

## 2016-05-13 NOTE — Discharge Instructions (Signed)
1. You should strain your urine and collect all fragments and bring them to your follow up appointment.  °2. You should take your pain medication as needed.  Please call if your pain is severe to the point that it is not controlled with your pain medication. °3. You should call if you develop fever > 101 or persistent nausea or vomiting. °4. Your doctor may prescribe tamsulosin to take to help facilitate stone passage. ° °Moderate Conscious Sedation, Adult, Care After °Refer to this sheet in the next few weeks. These instructions provide you with information on caring for yourself after your procedure. Your health care provider may also give you more specific instructions. Your treatment has been planned according to current medical practices, but problems sometimes occur. Call your health care provider if you have any problems or questions after your procedure. °WHAT TO EXPECT AFTER THE PROCEDURE  °After your procedure: °· You may feel sleepy, clumsy, and have poor balance for several hours. °· Vomiting may occur if you eat too soon after the procedure. °HOME CARE INSTRUCTIONS °· Do not participate in any activities where you could become injured for at least 24 hours. Do not: °¨ Drive. °¨ Swim. °¨ Ride a bicycle. °¨ Operate heavy machinery. °¨ Cook. °¨ Use power tools. °¨ Climb ladders. °¨ Work from a high place. °· Do not make important decisions or sign legal documents until you are improved. °· If you vomit, drink water, juice, or soup when you can drink without vomiting. Make sure you have little or no nausea before eating solid foods. °· Only take over-the-counter or prescription medicines for pain, discomfort, or fever as directed by your health care provider. °· Make sure you and your family fully understand everything about the medicines given to you, including what side effects may occur. °· You should not drink alcohol, take sleeping pills, or take medicines that cause drowsiness for at least 24  hours. °· If you smoke, do not smoke without supervision. °· If you are feeling better, you may resume normal activities 24 hours after you were sedated. °· Keep all appointments with your health care provider. °SEEK MEDICAL CARE IF: °· Your skin is pale or bluish in color. °· You continue to feel nauseous or vomit. °· Your pain is getting worse and is not helped by medicine. °· You have bleeding or swelling. °· You are still sleepy or feeling clumsy after 24 hours. °SEEK IMMEDIATE MEDICAL CARE IF: °· You develop a rash. °· You have difficulty breathing. °· You develop any type of allergic problem. °· You have a fever. °MAKE SURE YOU: °· Understand these instructions. °· Will watch your condition. °· Will get help right away if you are not doing well or get worse. °  °This information is not intended to replace advice given to you by your health care provider. Make sure you discuss any questions you have with your health care provider. °  °Document Released: 10/03/2013 Document Revised: 01/03/2015 Document Reviewed: 10/03/2013 °Elsevier Interactive Patient Education ©2016 [employer] Inc. ° °

## 2016-05-25 ENCOUNTER — Ambulatory Visit
Admission: RE | Admit: 2016-05-25 | Discharge: 2016-05-25 | Disposition: A | Discharge status: Home / Self Care | Payer: Medicare Other | Source: Ambulatory Visit | Attending: Family Medicine | Admitting: Family Medicine

## 2016-05-25 DIAGNOSIS — N201 Calculus of ureter: Secondary | ICD-10-CM | POA: Diagnosis not present

## 2016-05-25 DIAGNOSIS — N83201 Unspecified ovarian cyst, right side: Secondary | ICD-10-CM

## 2016-05-25 DIAGNOSIS — Z0000 Encounter for general adult medical examination without abnormal findings: Secondary | ICD-10-CM | POA: Diagnosis not present

## 2016-05-29 DIAGNOSIS — N760 Acute vaginitis: Secondary | ICD-10-CM | POA: Diagnosis not present

## 2016-05-29 DIAGNOSIS — N898 Other specified noninflammatory disorders of vagina: Secondary | ICD-10-CM | POA: Diagnosis not present

## 2016-05-29 DIAGNOSIS — B373 Candidiasis of vulva and vagina: Secondary | ICD-10-CM | POA: Diagnosis not present

## 2016-06-14 DIAGNOSIS — Z85828 Personal history of other malignant neoplasm of skin: Secondary | ICD-10-CM | POA: Diagnosis not present

## 2016-06-14 DIAGNOSIS — L550 Sunburn of first degree: Secondary | ICD-10-CM | POA: Diagnosis not present

## 2016-07-07 ENCOUNTER — Ambulatory Visit (INDEPENDENT_AMBULATORY_CARE_PROVIDER_SITE_OTHER): Payer: Medicare Other | Admitting: Gastroenterology

## 2016-07-07 ENCOUNTER — Encounter: Payer: Self-pay | Admitting: Gastroenterology

## 2016-07-07 VITALS — BP 118/68 | HR 80 | Ht 61.5 in | Wt 154.0 lb

## 2016-07-07 DIAGNOSIS — R197 Diarrhea, unspecified: Secondary | ICD-10-CM | POA: Diagnosis not present

## 2016-07-07 DIAGNOSIS — K589 Irritable bowel syndrome without diarrhea: Secondary | ICD-10-CM

## 2016-07-07 DIAGNOSIS — R159 Full incontinence of feces: Secondary | ICD-10-CM

## 2016-07-07 DIAGNOSIS — K5900 Constipation, unspecified: Secondary | ICD-10-CM | POA: Diagnosis not present

## 2016-07-07 NOTE — Patient Instructions (Signed)
Use Benefiber 1 tablespoon three times a day with meals  VSL # 3 112 BU daily can be purchased over the counter at your pharmacy Use Anusol rectal cream as needed Follow up in 6-12 months as needed

## 2016-07-12 DIAGNOSIS — M1990 Unspecified osteoarthritis, unspecified site: Secondary | ICD-10-CM | POA: Diagnosis not present

## 2016-07-12 DIAGNOSIS — J309 Allergic rhinitis, unspecified: Secondary | ICD-10-CM | POA: Diagnosis not present

## 2016-07-12 DIAGNOSIS — I10 Essential (primary) hypertension: Secondary | ICD-10-CM | POA: Diagnosis not present

## 2016-07-12 DIAGNOSIS — E119 Type 2 diabetes mellitus without complications: Secondary | ICD-10-CM | POA: Diagnosis not present

## 2016-07-12 DIAGNOSIS — G4733 Obstructive sleep apnea (adult) (pediatric): Secondary | ICD-10-CM | POA: Diagnosis not present

## 2016-07-12 DIAGNOSIS — E785 Hyperlipidemia, unspecified: Secondary | ICD-10-CM | POA: Diagnosis not present

## 2016-07-12 NOTE — Progress Notes (Signed)
Kristen Mahoney    [id number]    05/10/1946  Primary Care Physician:[name], MD  Referring Physician: [name], MD [place], [place] [address]  Chief complaint:  IBS, constipation alternating with diarrhea, fecal incontinence  HPI: 70 year old [place] American female, retired Pharmacist, hospital, with irritable bowel syndrome with predominant diarrhea. She has a history of rectovaginal fistula. She has had several rectal operations resulting in an incompetent rectal sphincter. She has a positive family history of colon cancer in her father and has undergone multiple colonoscopies. Per Dr Olevia Perches on last colonoscopy in April 2014 showed edematous tissue in the rectal ampulla with spasm in the sigmoid colon and partial prolapse of the rectal mucosa which causes intermittent bleeding.  She saw Dr. Henri Medal after her last visit here in February 2017, she underwent band ligation the rectumx1 session with no significant improvement. Patient opted to not undergo any further ligation or any surgical intervention. She continues to have alternating constipation with diarrhea and intermittent fecal incontinence. She also complained of intermittent episodes of bright red blood per rectum. She is reluctant to go for pelvic floor physical therapy as she has done it in the past with no significant benefit.   Outpatient Encounter Prescriptions as of 07/07/2016  Medication Sig  . acetaminophen (TYLENOL) 500 MG tablet Take 500 mg by mouth as needed.  [name] Kitchen acyclovir (ZOVIRAX) 200 MG capsule Take 5 times a day as needed  . atorvastatin (LIPITOR) 10 MG tablet Take 10 mg by mouth daily.  . Calcium Carbonate (CALTRATE 600 PO) Take 1 tablet by mouth daily.  . citalopram (CELEXA) 20 MG tablet Take 20 mg by mouth daily.    [name] Kitchen docusate sodium (COLACE) 100 MG capsule Take 1 capsule (100 mg total) by mouth every 12 (twelve) hours.  [name] Kitchen glimepiride (AMARYL) 2 MG tablet   . hyoscyamine  (LEVSIN SL) 0.125 MG SL tablet Take 1 tablet (0.125 mg total) by mouth every 6 (six) hours as needed for cramping.  . metFORMIN (GLUCOPHAGE) 500 MG tablet Take 1,000 mg by mouth 2 (two) times daily with a meal.   . metoprolol succinate (TOPROL XL) 50 MG 24 hr tablet Take 1 tablet (50 mg total) by mouth daily. Take with or immediately following a meal.  . MICRONIZED COLESTIPOL HCL 1 G tablet TAKE 2 TABLETS BY MOUTH EVERY DAY  . omeprazole (PRILOSEC) 20 MG capsule TAKE 1 CAPSULE TWICE A DAY EVERY DAY, NOT AS NEEDED  . ondansetron (ZOFRAN ODT) 4 MG disintegrating tablet Take 1 tablet (4 mg total) by mouth every 8 (eight) hours as needed for nausea or vomiting.  [name] Kitchen oxyCODONE-acetaminophen (PERCOCET/ROXICET) 5-325 MG tablet Take 1-2 tablets by mouth every 6 (six) hours as needed.  Vladimir Faster Glycol-Propyl Glycol (SYSTANE OP) Apply 1 drop to eye as needed.  . Vitamin D, Cholecalciferol, 400 units TABS Take by mouth.  . vitamin E (VITAMIN E) 400 UNIT capsule Take 400 Units by mouth daily.  . [DISCONTINUED] Cholecalciferol (VITAMIN D PO) Take 1 tablet by mouth daily.   . [DISCONTINUED] hydrocortisone (ANUSOL-HC) 2.5 % rectal cream Place 1 application rectally 2 (two) times daily.  . [DISCONTINUED] meclizine (ANTIVERT) 12.5 MG tablet Take 12.5 mg by mouth 3 (three) times daily as needed for dizziness.   No facility-administered encounter medications on file as of 07/07/2016.    Allergies as of 07/07/2016 - Review Complete 07/07/2016  Allergen Reaction Noted  . Ciprofloxacin Rash  09/04/2009  . Morphine and related Itching 05/07/2011    Past Medical History  Diagnosis Date  . Herpes progenitalis   . Urinary, incontinence, stress female   . Diabetes mellitus   . Hypertension   . Benign breast cyst in female     PATIENT HAS HISTORY OF BREAST CYSTS  . Kidney stone   . Vertigo   . OSA on CPAP   . Basal cell carcinoma     Past Surgical History  Procedure Laterality Date  . Rectovaginal fistula  closure    . Cholecystectomy  2003  . Knee arthroscopy      right  . Breast cystectomy      right  . Ear cystectomy    . Basal cell excised    . Abdominal hysterectomy  1986    leiomyomata  . Lithotripsy      Family History  Problem Relation Age of Onset  . Diabetes Mother   . Diabetes Father   . Colon cancer Father   . Heart disease Father   . Hypertension Father   . Diabetes Sister   . Hypertension Sister   . Diabetes Brother   . Colon polyps Brother   . Breast cancer Paternal Aunt     Age 66's  . Colon polyps Brother   . Heart disease Brother   . Stroke Brother   . Hyperlipidemia Brother   . Colon polyps Sister   . Diabetes Sister   . Hypertension Sister   . Heart disease Sister   . Esophageal cancer Neg Hx   . Rectal cancer Neg Hx   . Stomach cancer Neg Hx     Social History   Social History  . Marital Status: Married    Spouse Name: N/A  . Number of Children: 2  . Years of Education: N/A   Occupational History  . Retired    Social History Main Topics  . Smoking status: Former Research scientist (life sciences)  . Smokeless tobacco: Never Used  . Alcohol Use: Yes     Comment: occasional Rare  . Drug Use: No  . Sexual Activity: Yes    Birth Control/ Protection: Surgical   Other Topics Concern  . Not on file   Social History Narrative   Denies caffeine use.      Review of systems: Review of Systems  Constitutional: Negative for fever and chills.  HENT: Negative.   Eyes: Negative for blurred vision.  Respiratory: Negative for cough, shortness of breath and wheezing.   Cardiovascular: Negative for chest pain and palpitations.  Gastrointestinal: as per HPI Genitourinary: Negative for dysuria, urgency, frequency and hematuria.  Musculoskeletal: Positive for myalgias, back pain and joint pain.  Skin: Negative for itching and rash.  Neurological: Negative for dizziness, tremors, focal weakness, seizures and loss of consciousness.  Psychiatric/Behavioral: Negative for  depression, suicidal ideas and hallucinations.  All other systems reviewed and are negative.   Physical Exam: Filed Vitals:   07/07/16 0941  BP: 118/68  Pulse: 80   Gen:      No acute distress HEENT:  EOMI, sclera anicteric Neck:     No masses; no thyromegaly Lungs:    Clear to auscultation bilaterally; normal respiratory effort CV:         Regular rate and rhythm; no murmurs Abd:      + bowel sounds; soft, non-tender; no palpable masses, no distension Ext:    No edema; adequate peripheral perfusion Skin:      Warm and dry; no  rash Neuro: alert and oriented x 3 Psych: normal mood and affect Rectal exam: Very low anal sphincter tone with rectal prolapse ~1-2 cm on bear down , no anal fissure or external hemorrhoids   Data Reviewed: Reviewed pertinent GI workup in epic   Assessment and Plan/Recommendations:  70 year old female with history of irritable bowel syndrome, rectal prolapse status post multiple rectal surgeries here with complaints of alternating constipation and diarrhea and intermittent fecal incontinence Intermittent bright red blood per rectum possibly in the setting of rectal prolapse and internal hemorrhoids Patient did not think band ligation improved her symptoms and opted to not undergo any further ligation Start probiotic VSL#3 (112 billion units) 1 capsule daily Benefiber 1 tablespoon 3 times daily with meals Use Anusol cream per rectum as needed Kegels  Exercise daily Follow-up in 6-12 months or sooner if needed  25 minutes was spent face-to-face with the patient. Greater than 50% of the time used for counseling as well as treatment plan and follow-up. She had multiple questions which were answered to her satisfaction    K. Denzil Magnuson , MD [phone] Mon-Fri 8a-5p [phone] after 5p, weekends, holidays  CC: Rankins, Bill Salinas, MD

## 2016-07-13 ENCOUNTER — Encounter: Payer: Self-pay | Admitting: Neurology

## 2016-07-13 ENCOUNTER — Ambulatory Visit (INDEPENDENT_AMBULATORY_CARE_PROVIDER_SITE_OTHER): Payer: Medicare Other | Admitting: Neurology

## 2016-07-13 VITALS — BP 122/62 | HR 88 | Resp 20 | Ht 62.0 in | Wt 146.0 lb

## 2016-07-13 DIAGNOSIS — Z9989 Dependence on other enabling machines and devices: Secondary | ICD-10-CM

## 2016-07-13 DIAGNOSIS — G4733 Obstructive sleep apnea (adult) (pediatric): Secondary | ICD-10-CM | POA: Diagnosis not present

## 2016-07-13 DIAGNOSIS — G478 Other sleep disorders: Secondary | ICD-10-CM

## 2016-07-13 DIAGNOSIS — G4750 Parasomnia, unspecified: Secondary | ICD-10-CM | POA: Insufficient documentation

## 2016-07-13 NOTE — Progress Notes (Signed)
SLEEP MEDICINE CLINIC   Provider:  Larey Seat, M D  Referring Provider: [name], MD Primary Care Physician:  [name], MD  Chief Complaint  Patient presents with  . Follow-up    cpap going well    HPI:  Kristen Mahoney is a 70 y.o. female  Is seen here as a  revisit  from Dr. Radene Ou for sleep medicine evaluation, CPAP compliance,   I have not encountered Kristen Mahoney and a little over 3 years. The patient is here as a new patient as-established patient revisit. She has seen me in the past for her sleep disorder which is obstructive sleep apnea. Her primary care physician is now [place]. She needs new CPAP supplies and ongoing follow-up and for this reason brought his CPAP machine here today. As I have seen Mrs. Comp last she has become a grandmother her grandson just celebrated his second birthday. She is a mother of 2 adult children. She is retired. He is also followed by orthopedist Dr. Elesa Massed and by cardiologist Dr. Terrence Dupont . She has diabetes which has been well controlled as long as she was able to exercise and walk. Due to knee pain she has been less able to exercise. She has no medication side effects that she reports at this time she feels extraordinarily well but she needs new supplies.  Mrs. Able's sleep study from 09-05-2007 revealed an AHI of 29.8, supine AHI of 47.4 and nonsupine of 11.1 REM AHI of 32. Periodic leg movements for a few was an arousal index of 3.6 she was snoring. She was titrated to CPAP but then placed on an auto titrator as she achieved weight loss.  I was able to review the patient's last laboratory results her hemoglobin A1c was elevated after receiving cortisone shots into the knee at 7.7. This has been higher than usual for her. No cause was 160s 3. Cholesterol is controlled. Also she has a discrepancy between LDL and HDL. Rankins once to make sure that the patient is followed for her CPAP and compliance.  Her sleep habits are  described as follows, she usually goes to bed late and watches TV usually a movie at about 2 AM before finally going to sleep. Fall asleep in front of the TV. The TV in the bedroom. She stated that her husband rarely disturbs her sleep and he may occasionally snore. But she is moving restlessly. She may have one bathroom break at night but usually nocturia does not fragment his sleep. And she rises in the morning at 10 AM. Today's an unusual early day for her now in retirement. She may drink some caffeine in the morning. She has breakfast at home. She is trying to exercise by walking.   Interval history from 07/13/2016. Mrs. Meraz's apnea has now been well controlled by using an AutoSet between 5 and 15 cm water was 1 cm EPR, the 95th percentile pressure is 12.5 cm her apnea index is 5.0 there still 3.5 obstructive apneas per hour but these are negligible. She is using her machine 7 hours 1 minute each day on average and has a 97% compliance for over 4 hours of use. Based on these data I will see the patient again in one year. Her Epworth sleepiness score today was endorsed at 7 and her fatigue severity at 15 points. There is no evidence of clinical depression. Review of Systems: Out of a complete 14 system review, the patient complains of only the following  symptoms, and all other reviewed systems are negative. Big toe pain,  Running up to the knee- DM neuropathy? Gout.  The patient has transiently suffered from higher blood pressures on higher blood glucose levels due to the steroid treatment of her knee pain. She further has reported snoring at times. Vision joint pain aching muscles and diarrhea and dizziness.  The patient still socially drinks some alcohol she drinks her coffee or caffeinated sodas occasionally but certainly not daily she is a retired Sales executive. She lives with her husband. She just travelled to [place].  Epworth score 7 , Fatigue severity score 15  , depression score 2.    Social  History   Social History  . Marital Status: Married    Spouse Name: N/A  . Number of Children: 2  . Years of Education: N/A   Occupational History  . Retired    Social History Main Topics  . Smoking status: Former Research scientist (life sciences)  . Smokeless tobacco: Never Used  . Alcohol Use: Yes     Comment: occasional Rare  . Drug Use: No  . Sexual Activity: Yes    Birth Control/ Protection: Surgical   Other Topics Concern  . Not on file   Social History Narrative   Denies caffeine use.    Family History  Problem Relation Age of Onset  . Diabetes Mother   . Diabetes Father   . Colon cancer Father   . Heart disease Father   . Hypertension Father   . Diabetes Sister   . Hypertension Sister   . Diabetes Brother   . Colon polyps Brother   . Breast cancer Paternal Aunt     Age 46's  . Colon polyps Brother   . Heart disease Brother   . Stroke Brother   . Hyperlipidemia Brother   . Colon polyps Sister   . Diabetes Sister   . Hypertension Sister   . Heart disease Sister   . Esophageal cancer Neg Hx   . Rectal cancer Neg Hx   . Stomach cancer Neg Hx     Past Medical History  Diagnosis Date  . Herpes progenitalis   . Urinary, incontinence, stress female   . Diabetes mellitus   . Hypertension   . Benign breast cyst in female     PATIENT HAS HISTORY OF BREAST CYSTS  . Kidney stone   . Vertigo   . OSA on CPAP   . Basal cell carcinoma     Past Surgical History  Procedure Laterality Date  . Rectovaginal fistula closure    . Cholecystectomy  2003  . Knee arthroscopy      right  . Breast cystectomy      right  . Ear cystectomy    . Basal cell excised    . Abdominal hysterectomy  1986    leiomyomata  . Lithotripsy      Current Outpatient Prescriptions  Medication Sig Dispense Refill  . acetaminophen (TYLENOL) 500 MG tablet Take 500 mg by mouth as needed.    [name] Kitchen acyclovir (ZOVIRAX) 200 MG capsule Take 5 times a day as needed 60 capsule 4  . atorvastatin (LIPITOR) 10 MG  tablet Take 10 mg by mouth daily.    . Calcium Carbonate (CALTRATE 600 PO) Take 1 tablet by mouth daily.    . citalopram (CELEXA) 20 MG tablet Take 20 mg by mouth daily.      [name] Kitchen docusate sodium (COLACE) 100 MG capsule Take 1 capsule (100 mg total) by mouth every  12 (twelve) hours. 60 capsule 0  . glimepiride (AMARYL) 2 MG tablet   2  . hyoscyamine (LEVSIN SL) 0.125 MG SL tablet Take 1 tablet (0.125 mg total) by mouth every 6 (six) hours as needed for cramping. 90 tablet 0  . metFORMIN (GLUCOPHAGE) 500 MG tablet Take 1,000 mg by mouth 2 (two) times daily with a meal.     . metoprolol succinate (TOPROL XL) 50 MG 24 hr tablet Take 1 tablet (50 mg total) by mouth daily. Take with or immediately following a meal. 30 tablet 0  . MICRONIZED COLESTIPOL HCL 1 G tablet TAKE 2 TABLETS BY MOUTH EVERY DAY 60 tablet 2  . omeprazole (PRILOSEC) 20 MG capsule TAKE 1 CAPSULE TWICE A DAY EVERY DAY, NOT AS NEEDED 180 capsule 1  . ondansetron (ZOFRAN ODT) 4 MG disintegrating tablet Take 1 tablet (4 mg total) by mouth every 8 (eight) hours as needed for nausea or vomiting. 20 tablet 0  . oxyCODONE-acetaminophen (PERCOCET/ROXICET) 5-325 MG tablet Take 1-2 tablets by mouth every 6 (six) hours as needed. 20 tablet 0  . Polyethyl Glycol-Propyl Glycol (SYSTANE OP) Apply 1 drop to eye as needed.    . Vitamin D, Cholecalciferol, 400 units TABS Take by mouth.    . vitamin E (VITAMIN E) 400 UNIT capsule Take 400 Units by mouth daily.     No current facility-administered medications for this visit.    Allergies as of 07/13/2016 - Review Complete 07/13/2016  Allergen Reaction Noted  . Ciprofloxacin Rash 09/04/2009  . Morphine and related Itching 05/07/2011    Vitals: BP 122/62 mmHg  Pulse 88  Resp 20  Ht 5\' 2"  (1.575 m)  Wt 146 lb (66.225 kg)  BMI 26.70 kg/m2 Last Weight:  Wt Readings from Last 1 Encounters:  07/13/16 146 lb (66.225 kg)       Last Height:   Ht Readings from Last 1 Encounters:  07/13/16 5\' 2"   (1.575 m)    Physical exam:  General: The patient is awake, alert and appears not in acute distress. The patient is well groomed. Head: Normocephalic, atraumatic. Neck is supple. Mallampati 3   neck circumference: 14 inches. Nasal airflow ; allergies affected. TMJ is  Not  evident . Retrognathia is seen.  Cardiovascular:  Regular rate and rhythm without  murmurs or carotid bruit, and without distended neck veins. Respiratory: Lungs are clear to auscultation. Skin:  Without evidence of edema, or rash. Trunk: BMI is elevated and patient  has normal posture.  Neurologic exam :The patient is awake and alert, oriented to place and time.   Memory subjective  described as intact. There is a normal attention span & concentration ability. Speech is fluent without dysarthria, dysphonia or aphasia. Mood and affect are appropriate. Cranial nerves:Pupils are equal and briskly reactive to light. Funduscopic exam without evidence of pallor or edema. Extraocular movements  in vertical and horizontal planes intact and without nystagmus. Visual fields by finger perimetry are intact.Hearing to finger rub intact.  Facial sensation intact to fine touch. Facial motor strength is symmetric and tongue and uvula move midline.  Motor exam:  Normal tone ,muscle bulk and symmetric strength in all extremities.   Assessment:  After physical and neurologic examination, review of laboratory studies, imaging, neurophysiology testing and pre-existing records, assessment is   Possible spinal stenosis , hyperreflexia in both knees , in spite of diabetes. Weakness when rising , needs to brace herself.  The patient was advised of the nature of the diagnosed  sleep disorder , the treatment options and risks for general a health and wellness arising from not treating the condition. Visit duration was 30 minutes.   Plan:  Treatment plan and additional workup :  I will prescribe replacement mask tubing filter etc. for the patient I  also had  her pressure window to be elevated to 15 cm water. Instead I changed the patient to an auto titrated between 5 and 15 cm water was 1 cm EPR. She has been highly compliant at 97% with an average user time of 7 hours and 1 minute each night, her residual AHI is 5.0. She has moderate air leaks, the 95th percentile pressure is 12.5 cm.  Dizziness has improved, keep hydrated.  This helps with your kidney stones , too.   Rv in 12 month with CPAP, NP.      Asencion Partridge Jacora Hopkins MD  07/13/2016

## 2016-07-14 ENCOUNTER — Other Ambulatory Visit: Payer: Self-pay | Admitting: Women's Health

## 2016-07-22 DIAGNOSIS — M545 Low back pain: Secondary | ICD-10-CM | POA: Diagnosis not present

## 2016-07-22 DIAGNOSIS — M170 Bilateral primary osteoarthritis of knee: Secondary | ICD-10-CM | POA: Diagnosis not present

## 2016-07-22 DIAGNOSIS — M1711 Unilateral primary osteoarthritis, right knee: Secondary | ICD-10-CM | POA: Diagnosis not present

## 2016-08-04 DIAGNOSIS — M5441 Lumbago with sciatica, right side: Secondary | ICD-10-CM | POA: Diagnosis not present

## 2016-08-13 DIAGNOSIS — M545 Low back pain: Secondary | ICD-10-CM | POA: Diagnosis not present

## 2016-08-17 DIAGNOSIS — M545 Low back pain: Secondary | ICD-10-CM | POA: Diagnosis not present

## 2016-08-17 DIAGNOSIS — M5441 Lumbago with sciatica, right side: Secondary | ICD-10-CM | POA: Diagnosis not present

## 2016-08-18 DIAGNOSIS — M5416 Radiculopathy, lumbar region: Secondary | ICD-10-CM | POA: Diagnosis not present

## 2016-08-26 DIAGNOSIS — M5416 Radiculopathy, lumbar region: Secondary | ICD-10-CM | POA: Diagnosis not present

## 2016-09-14 DIAGNOSIS — M5416 Radiculopathy, lumbar region: Secondary | ICD-10-CM | POA: Diagnosis not present

## 2016-09-20 DIAGNOSIS — E119 Type 2 diabetes mellitus without complications: Secondary | ICD-10-CM | POA: Diagnosis not present

## 2016-09-20 DIAGNOSIS — Z23 Encounter for immunization: Secondary | ICD-10-CM | POA: Diagnosis not present

## 2016-09-20 DIAGNOSIS — Q845 Enlarged and hypertrophic nails: Secondary | ICD-10-CM | POA: Diagnosis not present

## 2016-10-01 DIAGNOSIS — N200 Calculus of kidney: Secondary | ICD-10-CM | POA: Diagnosis not present

## 2016-10-06 ENCOUNTER — Encounter: Payer: Self-pay | Admitting: Podiatry

## 2016-10-06 ENCOUNTER — Ambulatory Visit (INDEPENDENT_AMBULATORY_CARE_PROVIDER_SITE_OTHER): Payer: Medicare Other

## 2016-10-06 ENCOUNTER — Ambulatory Visit (INDEPENDENT_AMBULATORY_CARE_PROVIDER_SITE_OTHER): Payer: Medicare Other | Admitting: Podiatry

## 2016-10-06 VITALS — BP 142/82 | HR 78 | Resp 16

## 2016-10-06 DIAGNOSIS — R52 Pain, unspecified: Secondary | ICD-10-CM

## 2016-10-06 DIAGNOSIS — M79672 Pain in left foot: Secondary | ICD-10-CM

## 2016-10-06 DIAGNOSIS — G5792 Unspecified mononeuropathy of left lower limb: Secondary | ICD-10-CM

## 2016-10-06 DIAGNOSIS — G5791 Unspecified mononeuropathy of right lower limb: Secondary | ICD-10-CM | POA: Diagnosis not present

## 2016-10-06 DIAGNOSIS — M79671 Pain in right foot: Secondary | ICD-10-CM

## 2016-10-06 MED ORDER — NONFORMULARY OR COMPOUNDED ITEM: 2 refills | Status: DC

## 2016-10-06 NOTE — Progress Notes (Signed)
   Subjective:    Patient ID: Kristen Mahoney, female    DOB: Nov 10, 1946, 70 y.o.   MRN: [id number]  HPI    Review of Systems  HENT: Positive for sore throat.   Gastrointestinal: Positive for constipation, diarrhea and nausea.  Musculoskeletal: Positive for back pain.  Neurological: Positive for weakness.       Objective:   Physical Exam        Assessment & Plan:

## 2016-10-11 MED ORDER — BETAMETHASONE SOD PHOS & ACET 6 (3-3) MG/ML IJ SUSP: 3.0000 mg | Freq: Once | INTRAMUSCULAR | Status: DC

## 2016-10-11 NOTE — Progress Notes (Signed)
Patient ID: Kristen Mahoney, female   DOB: 1946-09-05, 70 y.o.   MRN: [id number] Subjective:  Patient with a history of diabetes 10 years presents today for bilateral foot pain. Patient states that she has a burning sensation on the ball of her feet bilaterally. Patient also states she is history of knee problems and back problems. Patient was referred by Dr. Milagros Evener.    Objective/Physical Exam General: The patient is alert and oriented x3 in no acute distress.  Dermatology: Skin is warm, dry and supple bilateral lower extremities. Negative for open lesions or macerations.  Vascular: Palpable pedal pulses bilaterally. No edema or erythema noted. Capillary refill within normal limits.  Neurological:  Epicritic and protective threshold grossly intact bilaterally.   Musculoskeletal Exam: Pain on palpation of the second interspace of the bilateral feet with lateral compression of the metatarsal heads-positive Mulder sign.Range of controlling motion within normal limits to all pedal and ankle joints bilateral. Muscle strength 5/5 in all groups bilateral.    Assessment: #1 neuritis second interspace bilateral feet #2 interdigital neuromas second interspace bilateral feet #3 pain in bilateral feet   Plan of Care:  #1 Patient was evaluated. #2 injection of 0.5 mL Celestone Soluspan injected into the second interspaces bilaterally. #3 metatarsal pads were dispensed for the patient to try for offloading of the bilateral forefeet. #4 patient is to return to the clinic in 4 weeks   Dr. Edrick Kins, [place]

## 2016-10-12 ENCOUNTER — Telehealth: Payer: Self-pay | Admitting: *Deleted

## 2016-10-12 NOTE — Telephone Encounter (Signed)
Pt states she has not received her rx from [place] and would like their phone number. I gave pt the phone number to [facility].

## 2016-11-03 ENCOUNTER — Ambulatory Visit (INDEPENDENT_AMBULATORY_CARE_PROVIDER_SITE_OTHER): Payer: Medicare Other | Admitting: Podiatry

## 2016-11-03 DIAGNOSIS — M79676 Pain in unspecified toe(s): Secondary | ICD-10-CM

## 2016-11-03 DIAGNOSIS — L600 Ingrowing nail: Secondary | ICD-10-CM | POA: Diagnosis not present

## 2016-11-03 DIAGNOSIS — L03039 Cellulitis of unspecified toe: Secondary | ICD-10-CM

## 2016-11-07 NOTE — Progress Notes (Signed)
Subjective: Patient presents today for evaluation of pain in her toe(s). Patient is concerned for possible ingrown nail. Patient states that the pain has been present for a few weeks now. Patient presents today for further treatment and evaluation.  Objective:  General: Well developed, nourished, in no acute distress, alert and oriented x3   Dermatology: Skin is warm, dry and supple bilateral. Medial border of the bilateral great toes appears to be erythematous with evidence of an ingrowing nail. Purulent drainage noted with intruding nail into the respective nail fold. Pain on palpation noted to the border of the nail fold. The remaining nails appear unremarkable at this time. There are no open sores, lesions.  Vascular: Dorsalis Pedis artery and Posterior Tibial artery pedal pulses palpable. No lower extremity edema noted.   Neruologic: Grossly intact via light touch bilateral.  Musculoskeletal: Muscular strength within normal limits in all groups bilateral. Normal range of motion noted to all pedal and ankle joints.   Assesement: #1 paronychia medial border of the bilateral great toes #2 ingrowing nail medial border of the bilateral great toes #3 pain in bilateral great toes   Plan of Care:  1. Patient evaluated.  2. Discussed treatment alternatives and plan of care. Explained nail avulsion procedure and post procedure course to patient. 3. Patient opted for permanent partial nail avulsion.  4. Prior to procedure, local anesthesia infiltration utilized using 3 ml of a 50:50 mixture of 2% plain lidocaine and 0.5% plain marcaine in a normal hallux block fashion and a betadine prep performed.  5. Partial permanent nail avulsion with chemical matrixectomy performed using [id number] applications of phenol followed by alcohol flush.  6. Light dressing applied. 7. Return to clinic in 2 weeks.   Dr. Edrick Kins [facility]

## 2016-11-09 DIAGNOSIS — H35039 Hypertensive retinopathy, unspecified eye: Secondary | ICD-10-CM | POA: Diagnosis not present

## 2016-11-09 DIAGNOSIS — E119 Type 2 diabetes mellitus without complications: Secondary | ICD-10-CM | POA: Diagnosis not present

## 2016-11-09 DIAGNOSIS — H47329 Drusen of optic disc, unspecified eye: Secondary | ICD-10-CM | POA: Diagnosis not present

## 2016-11-17 ENCOUNTER — Ambulatory Visit: Payer: Medicare Other | Admitting: Podiatry

## 2016-11-24 ENCOUNTER — Ambulatory Visit (INDEPENDENT_AMBULATORY_CARE_PROVIDER_SITE_OTHER): Payer: Medicare Other | Admitting: Women's Health

## 2016-11-24 ENCOUNTER — Encounter: Payer: Self-pay | Admitting: Women's Health

## 2016-11-24 VITALS — BP 115/79

## 2016-11-24 DIAGNOSIS — R350 Frequency of micturition: Secondary | ICD-10-CM | POA: Diagnosis not present

## 2016-11-24 DIAGNOSIS — A5901 Trichomonal vulvovaginitis: Secondary | ICD-10-CM | POA: Diagnosis not present

## 2016-11-24 LAB — WET PREP FOR TRICH, YEAST, CLUE: YEAST WET PREP: NONE SEEN

## 2016-11-24 MED ORDER — METRONIDAZOLE 500 MG PO TABS: 500.0000 mg | ORAL_TABLET | Freq: Two times a day (BID) | ORAL | 0 refills | Status: DC

## 2016-11-24 NOTE — Patient Instructions (Addendum)
Trichomoniasis Trichomoniasis is an infection caused by an organism called Trichomonas. The infection can affect both women and men. In women, the outer female genitalia and the vagina are affected. In men, the penis is mainly affected, but the prostate and other reproductive organs can also be involved. Trichomoniasis is a sexually transmitted infection (STI) and is most often passed to another person through sexual contact.  RISK FACTORS  Having unprotected sexual intercourse.  Having sexual intercourse with an infected partner. SIGNS AND SYMPTOMS  Symptoms of trichomoniasis in women include:  Abnormal gray-green frothy vaginal discharge.  Itching and irritation of the vagina.  Itching and irritation of the area outside the vagina. Symptoms of trichomoniasis in men include:   Penile discharge with or without pain.  Pain during urination. This results from inflammation of the urethra. DIAGNOSIS  Trichomoniasis may be found during a Pap test or physical exam. Your health care provider may use one of the following methods to help diagnose this infection:  Testing the pH of the vagina with a test tape.  Using a vaginal swab test that checks for the Trichomonas organism. A test is available that provides results within a few minutes.  Examining a urine sample.  Testing vaginal secretions. Your health care provider may test you for other STIs, including HIV. TREATMENT   You may be given medicine to fight the infection. Women should inform their health care provider if they could be or are pregnant. Some medicines used to treat the infection should not be taken during pregnancy.  Your health care provider may recommend over-the-counter medicines or creams to decrease itching or irritation.  Your sexual partner will need to be treated if infected.  Your health care provider may test you for infection again 3 months after treatment. HOME CARE INSTRUCTIONS   Take medicines only as  directed by your health care provider.  Take over-the-counter medicine for itching or irritation as directed by your health care provider.  Do not have sexual intercourse while you have the infection.  Women should not douche or wear tampons while they have the infection.  Discuss your infection with your partner. Your partner may have gotten the infection from you, or you may have gotten it from your partner.  Have your sex partner get examined and treated if necessary.  Practice safe, informed, and protected sex.  See your health care provider for other STI testing. SEEK MEDICAL CARE IF:   You still have symptoms after you finish your medicine.  You develop abdominal pain.  You have pain when you urinate.  You have bleeding after sexual intercourse.  You develop a rash.  Your medicine makes you sick or makes you throw up (vomit). MAKE SURE YOU:  Understand these instructions.  Will watch your condition.  Will get help right away if you are not doing well or get worse. This information is not intended to replace advice given to you by your health care provider. Make sure you discuss any questions you have with your health care provider. Document Released: 06/08/2001 Document Revised: 01/03/2015 Document Reviewed: 09/24/2013 Elsevier Interactive Patient Education  2017 [employer]. Dietary Guidelines to Help Prevent Kidney Stones Your risk of kidney stones can be decreased by adjusting the foods you eat. The most important thing you can do is drink enough fluid. You should drink enough fluid to keep your urine clear or pale yellow. The following guidelines provide specific information for the type of kidney stone you have had. Guidelines according to  type of kidney stone Calcium Oxalate Kidney Stones  Reduce the amount of salt you eat. Foods that have a lot of salt cause your body to release excess calcium into your urine. The excess calcium can combine with a substance  called oxalate to form kidney stones.  Reduce the amount of animal protein you eat if the amount you eat is excessive. Animal protein causes your body to release excess calcium into your urine. Ask your dietitian how much protein from animal sources you should be eating.  Avoid foods that are high in oxalates. If you take vitamins, they should have less than 500 mg of vitamin C. Your body turns vitamin C into oxalates. You do not need to avoid fruits and vegetables high in vitamin C. Calcium Phosphate Kidney Stones  Reduce the amount of salt you eat to help prevent the release of excess calcium into your urine.  Reduce the amount of animal protein you eat if the amount you eat is excessive. Animal protein causes your body to release excess calcium into your urine. Ask your dietitian how much protein from animal sources you should be eating.  Get enough calcium from food or take a calcium supplement (ask your dietitian for recommendations). Food sources of calcium that do not increase your risk of kidney stones include:  Broccoli.  Dairy products, such as cheese and yogurt.  Pudding. Uric Acid Kidney Stones  Do not have more than 6 oz of animal protein per day. Food sources Publishing rights manager (all types).  Poultry.  Eggs.  Fish, seafood. Foods High in [employer] seasonings.  Soy sauce.  Teriyaki sauce.  Cured and processed meats.  Salted crackers and snack foods.  Fast food.  Canned soups and most canned foods. Foods High in Oxalates  Grains:  Amaranth.  Barley.  Grits.  Wheat germ.  Bran.  Buckwheat flour.  All bran cereals.  Pretzels.  Whole wheat bread.  Vegetables:  Beans (wax).  Beets and beet greens.  Collard greens.  Eggplant.  Escarole.  Leeks.  Okra.  Parsley.  Rutabagas.  Spinach.  Swiss chard.  Tomato paste.  Fried potatoes.  Sweet potatoes.  Fruits:  Red  currants.  Figs.  Kiwi.  Rhubarb.  Meat and Other Protein Sources:  Beans (dried).  Soy burgers and other soybean products.  Miso.  Nuts (peanuts, almonds, pecans, cashews, hazelnuts).  Nut butters.  Sesame seeds and tahini (paste made of sesame seeds).  Poppy seeds.  Beverages:  Chocolate drink mixes.  Soy milk.  Instant iced tea.  Juices made from high-oxalate fruits or vegetables.  Other:  Carob.  Chocolate.  Fruitcake.  Marmalades. This information is not intended to replace advice given to you by your health care provider. Make sure you discuss any questions you have with your health care provider. Document Released: 04/09/2011 Document Revised: 05/20/2016 Document Reviewed: 11/09/2013 Elsevier Interactive Patient Education  2017 [employer].

## 2016-11-24 NOTE — Progress Notes (Signed)
Presents for evaluation of vaginal discharge with odor and vaginal irritation for 1 week. Has not been sexually active for greater than one month. Long history of trichomonas due to unfaithful husband. Also notes lower back pain x1 week. Denies visible blood in urine. Had previous lithotripsy May of 2017 without success. Long history of kidney stones. Denies urinary symptoms of pain, burning, abdominal pain or fever. Hysterectomy on no HRT. Hypertension, type 2 diabetes,  Exam: Appears well. Abdomen soft nontender no rebound or radiation of pain. No CVAT pain more sacral area. External genitalia: Erythematous Speculum exam: Vaginal walls erythematous with yellow frothy discharge  Wet prep: Pos Trichomonas.  Trichomonas vaginitis History of Kidney stones  Plan: Flagyl 500 mg BID X7 days. For partner treatment: Vaginal 2g once for husband. Alcohol precautions reviewed. Encouragement given to discuss relationship with husband, counseling. Encouraged to increase plain water consumption avoid caffeine,  Instructed to go to the ER with onset of severe pain or inability to void. UA: Pending will check test of cure at annual exam in several weeks.

## 2016-11-25 LAB — URINALYSIS W MICROSCOPIC + REFLEX CULTURE
BACTERIA UA: NONE SEEN [HPF]
BILIRUBIN URINE: NEGATIVE
CRYSTALS: NONE SEEN [HPF]
Casts: NONE SEEN [LPF]
GLUCOSE, UA: NEGATIVE
KETONES UR: NEGATIVE
Nitrite: NEGATIVE
RBC / HPF: NONE SEEN RBC/HPF (ref <=2)
Specific Gravity, Urine: 1.022 (ref 1.001–1.035)
Yeast: NONE SEEN [HPF]
pH: 5.5 (ref 5.0–8.0)

## 2016-11-26 LAB — URINE CULTURE

## 2016-11-29 ENCOUNTER — Telehealth: Payer: Self-pay | Admitting: *Deleted

## 2016-11-29 ENCOUNTER — Ambulatory Visit (INDEPENDENT_AMBULATORY_CARE_PROVIDER_SITE_OTHER): Payer: Medicare Other | Admitting: Podiatry

## 2016-11-29 DIAGNOSIS — M79672 Pain in left foot: Secondary | ICD-10-CM

## 2016-11-29 DIAGNOSIS — G5792 Unspecified mononeuropathy of left lower limb: Secondary | ICD-10-CM

## 2016-11-29 DIAGNOSIS — G5791 Unspecified mononeuropathy of right lower limb: Secondary | ICD-10-CM

## 2016-11-29 DIAGNOSIS — M79676 Pain in unspecified toe(s): Secondary | ICD-10-CM

## 2016-11-29 DIAGNOSIS — M79671 Pain in right foot: Secondary | ICD-10-CM

## 2016-11-29 DIAGNOSIS — L03039 Cellulitis of unspecified toe: Secondary | ICD-10-CM | POA: Diagnosis not present

## 2016-11-29 DIAGNOSIS — S91109D Unspecified open wound of unspecified toe(s) without damage to nail, subsequent encounter: Secondary | ICD-10-CM

## 2016-11-29 DIAGNOSIS — S91209D Unspecified open wound of unspecified toe(s) with damage to nail, subsequent encounter: Secondary | ICD-10-CM

## 2016-11-29 MED ORDER — METRONIDAZOLE 500 MG PO TABS: 500.0000 mg | ORAL_TABLET | Freq: Two times a day (BID) | ORAL | 0 refills | Status: DC

## 2016-11-29 NOTE — Telephone Encounter (Signed)
Pt informed, Rx sent. 

## 2016-11-29 NOTE — Telephone Encounter (Signed)
Ok, please call in 3 more Flagyl 500mg  for pt.  thanks

## 2016-11-29 NOTE — Telephone Encounter (Signed)
(  pt aware you are out of the office) Pt prescribed flagyl 500 mg x 7 days, and 2 grams for her husband. Pt gave her husband the wrong amount of pills and she is in the need for 3 pills, one pill for today and 2 pills for tomorrow. Please advise

## 2016-11-29 NOTE — Progress Notes (Signed)
Subjective: Patient presents today 2 weeks post ingrown nail permanent nail avulsion procedure. Patient states that the toe and nail fold is feeling much better.  Objective: Skin is warm, dry and supple. Nail and respective nail fold appears to be healing appropriately. Open wound to the associated nail fold with a granular wound base and moderate amount of fibrotic tissue. Minimal drainage noted. Mild erythema around the periungual region likely due to phenol chemical matricectomy.  Assessment: #1 postop permanent partial nail avulsion #2 open wound periungual nail fold of respective digit.  #3 bilateral neuroma-resolved  Plan of care: #1 patient was evaluated  #2 debridement of open wound was performed to the periungual border of the respective toe using a currette. Antibiotic ointment and Band-Aid was applied. #3 patient is to return to clinic on a PRN  basis.

## 2016-12-01 ENCOUNTER — Telehealth: Payer: Self-pay | Admitting: Neurology

## 2016-12-01 DIAGNOSIS — J309 Allergic rhinitis, unspecified: Secondary | ICD-10-CM | POA: Diagnosis not present

## 2016-12-01 DIAGNOSIS — I10 Essential (primary) hypertension: Secondary | ICD-10-CM | POA: Diagnosis not present

## 2016-12-01 DIAGNOSIS — M1990 Unspecified osteoarthritis, unspecified site: Secondary | ICD-10-CM | POA: Diagnosis not present

## 2016-12-01 DIAGNOSIS — G4733 Obstructive sleep apnea (adult) (pediatric): Secondary | ICD-10-CM | POA: Diagnosis not present

## 2016-12-01 DIAGNOSIS — K219 Gastro-esophageal reflux disease without esophagitis: Secondary | ICD-10-CM | POA: Diagnosis not present

## 2016-12-01 DIAGNOSIS — E119 Type 2 diabetes mellitus without complications: Secondary | ICD-10-CM | POA: Diagnosis not present

## 2016-12-01 DIAGNOSIS — E785 Hyperlipidemia, unspecified: Secondary | ICD-10-CM | POA: Diagnosis not present

## 2016-12-01 NOTE — Telephone Encounter (Signed)
Patient is calling requesting to speak with the nurse regarding her CPAP machine and getting a new rx for a different machine. She would like a smaller one. She would also like for me to state that she would like this by today if possible. Please call and advise.

## 2016-12-03 DIAGNOSIS — E119 Type 2 diabetes mellitus without complications: Secondary | ICD-10-CM | POA: Diagnosis not present

## 2016-12-07 ENCOUNTER — Encounter: Payer: Self-pay | Admitting: Women's Health

## 2016-12-07 ENCOUNTER — Ambulatory Visit (INDEPENDENT_AMBULATORY_CARE_PROVIDER_SITE_OTHER): Payer: Medicare Other | Admitting: Women's Health

## 2016-12-07 VITALS — BP 124/74 | Ht 63.0 in | Wt 158.0 lb

## 2016-12-07 DIAGNOSIS — N952 Postmenopausal atrophic vaginitis: Secondary | ICD-10-CM

## 2016-12-07 NOTE — Patient Instructions (Signed)

## 2016-12-07 NOTE — Progress Notes (Signed)
Kristen Mahoney [birth year] OF:[id number]    History:    Presents for breast and pelvic exam. TVH for fibroids. History of normal Pap and mammograms. 2014 negative colonoscopy. Has had Zostavax and Pneumovax. 11/2015 DEXA +3.6 at spine, hip average +1. Rare care manages labs and meds and hypertension.  Past medical history, past surgical history, family history and social history were all reviewed and documented in the EPIC chart. History of Trichomonas, husband asserts his faithfulness. History of diverticulitis.  ROS:  A ROS was performed and pertinent positives and negatives are included.  Exam:  Vitals:   12/07/16 1422  BP: 124/74  Weight: 158 lb (71.7 kg)  Height: 5\' 3"  (1.6 m)   Body mass index is 27.99 kg/m.   General appearance:  Normal Thyroid:  Symmetrical, normal in size, without palpable masses or nodularity. Respiratory  Auscultation:  Clear without wheezing or rhonchi Cardiovascular  Auscultation:  Regular rate, without rubs, murmurs or gallops  Edema/varicosities:  Not grossly evident Abdominal  Soft,nontender, without masses, guarding or rebound.  Liver/spleen:  No organomegaly noted  Hernia:  None appreciated  Skin  Inspection:  Grossly normal   Breasts: Examined lying and sitting.     Right: Without masses, retractions, discharge or axillary adenopathy.     Left: Without masses, retractions, discharge or axillary adenopathy. Gentitourinary   Inguinal/mons:  Normal without inguinal adenopathy  External genitalia:  Normal  BUS/Urethra/Skene's glands:  Normal  Vagina:  Atrophic  Cervix:  Uterus absent  Adnexa/parametria:     Rt: Without masses or tenderness.   Lt: Without masses or tenderness.  Anus and perineum: Normal  Digital rectal exam: Normal sphincter tone without palpated masses or tenderness  Assessment/Plan:  70 y.o. MBF G3 P2 for breast and pelvic exam no complaints.  TVH for fibroids no HRT Hypertension-primary care manages labs and  meds  Plan: SBE's, continue annual screening mammogram, calcium rich diet, vitamin D 1000 daily encouraged. Home safety, fall prevention and importance of weightbearing exercise reviewed. Reviewed DEXA noted strong bones. UA    [name] WHNP, 3:06 PM 12/07/2016

## 2016-12-07 NOTE — Telephone Encounter (Signed)
I contacted Stanton Kidney at [facility] to see if patient qualifies for a new machine or not.

## 2016-12-07 NOTE — Telephone Encounter (Signed)
Msg from [place]:  Kristen Mahoney,   Pt wouldn't be eligible for a new cpap through insurance. She received hers on 04/06/2013 and Medicare plans will only cover one every 5 years (04/07/2018 or after).   As far as the smaller travel cpap similar to the one that has been advertised on TV; if that is the one she is interested in we do have one but unfortunately they are private pay only as insurance considers it a Psychologist, educational. We have two options and they range from $700-$1400.   Let me know if I can help with anything else. Thanks.  Angie

## 2016-12-08 NOTE — Telephone Encounter (Signed)
I called patient back and left information below on vm (per DPR). Left our call back number and the number to [facility] if needed.

## 2016-12-29 ENCOUNTER — Other Ambulatory Visit: Payer: Self-pay | Admitting: Gastroenterology

## 2017-01-07 DIAGNOSIS — G4733 Obstructive sleep apnea (adult) (pediatric): Secondary | ICD-10-CM | POA: Diagnosis not present

## 2017-01-19 ENCOUNTER — Other Ambulatory Visit: Payer: Self-pay

## 2017-01-19 ENCOUNTER — Telehealth: Payer: Self-pay | Admitting: Gastroenterology

## 2017-01-19 MED ORDER — RANITIDINE HCL 150 MG PO TABS: 150.0000 mg | ORAL_TABLET | Freq: Every day | ORAL | 3 refills | Status: DC

## 2017-01-19 MED ORDER — OMEPRAZOLE 20 MG PO CPDR: DELAYED_RELEASE_CAPSULE | ORAL | 1 refills | Status: DC

## 2017-01-19 NOTE — Telephone Encounter (Signed)
Please advise patient to take omeprazole 40 mg in the morning 30 minutes before breakfast and Zantac 150 mg after dinner. Follow antireflux measures.

## 2017-01-19 NOTE — Telephone Encounter (Signed)
The patient takes Omeprazole 20 mg AM and PM. She has begun experiencing reflux mostly in the evenings and at night. Last seen in July 2017, but was seen for bowel issues. What should she do for the evening reflux issues?She has a cold and congestion and believes this may have contributed to this problem. Please advise.

## 2017-01-19 NOTE — Telephone Encounter (Signed)
Advised and agrees to this plan of care. rx called to [address].

## 2017-02-07 ENCOUNTER — Telehealth: Payer: Self-pay | Admitting: Gastroenterology

## 2017-02-07 NOTE — Telephone Encounter (Signed)
States her BM's are a little "burny" and she has some nausea. She will stop her Zantac. Will try OTC H2 blocker such as Pepcid. Watching for an earlier appointment with her primary GI.

## 2017-03-01 DIAGNOSIS — J309 Allergic rhinitis, unspecified: Secondary | ICD-10-CM | POA: Diagnosis not present

## 2017-03-01 DIAGNOSIS — E785 Hyperlipidemia, unspecified: Secondary | ICD-10-CM | POA: Diagnosis not present

## 2017-03-01 DIAGNOSIS — E119 Type 2 diabetes mellitus without complications: Secondary | ICD-10-CM | POA: Diagnosis not present

## 2017-03-01 DIAGNOSIS — I10 Essential (primary) hypertension: Secondary | ICD-10-CM | POA: Diagnosis not present

## 2017-03-09 ENCOUNTER — Encounter: Payer: Self-pay | Admitting: Gastroenterology

## 2017-03-09 ENCOUNTER — Telehealth: Payer: Self-pay

## 2017-03-09 ENCOUNTER — Encounter (INDEPENDENT_AMBULATORY_CARE_PROVIDER_SITE_OTHER): Payer: Self-pay

## 2017-03-09 ENCOUNTER — Ambulatory Visit (INDEPENDENT_AMBULATORY_CARE_PROVIDER_SITE_OTHER): Payer: Medicare Other | Admitting: Gastroenterology

## 2017-03-09 VITALS — BP 152/84 | HR 80 | Resp 18 | Ht 62.0 in | Wt 161.0 lb

## 2017-03-09 DIAGNOSIS — R12 Heartburn: Secondary | ICD-10-CM

## 2017-03-09 DIAGNOSIS — E119 Type 2 diabetes mellitus without complications: Secondary | ICD-10-CM | POA: Diagnosis not present

## 2017-03-09 DIAGNOSIS — K219 Gastro-esophageal reflux disease without esophagitis: Secondary | ICD-10-CM

## 2017-03-09 DIAGNOSIS — E785 Hyperlipidemia, unspecified: Secondary | ICD-10-CM | POA: Diagnosis not present

## 2017-03-09 DIAGNOSIS — IMO0001 Reserved for inherently not codable concepts without codable children: Secondary | ICD-10-CM

## 2017-03-09 DIAGNOSIS — K589 Irritable bowel syndrome without diarrhea: Secondary | ICD-10-CM

## 2017-03-09 DIAGNOSIS — R140 Abdominal distension (gaseous): Secondary | ICD-10-CM | POA: Diagnosis not present

## 2017-03-09 DIAGNOSIS — I10 Essential (primary) hypertension: Secondary | ICD-10-CM | POA: Diagnosis not present

## 2017-03-09 DIAGNOSIS — R1110 Vomiting, unspecified: Secondary | ICD-10-CM

## 2017-03-09 MED ORDER — PANTOPRAZOLE SODIUM 40 MG PO TBEC: 40.0000 mg | DELAYED_RELEASE_TABLET | Freq: Two times a day (BID) | ORAL | 3 refills | Status: DC

## 2017-03-09 NOTE — Patient Instructions (Addendum)
You have been scheduled for an endoscopy. Please follow written instructions given to you at your visit today. If you use inhalers (even only as needed), please bring them with you on the day of your procedure. Your physician has requested that you go to www.startemmi.com and enter the access code given to you at your visit today. This web site gives a general overview about your procedure. However, you should still follow specific instructions given to you by our office regarding your preparation for the procedure.  We will send Protonix to your pharmacy  Use Gaviscon as needed three times a day after meals  Use FD Gard after meals three times a day   Follow up in 3 months

## 2017-03-09 NOTE — Progress Notes (Signed)
Kristen Mahoney    [id number]    1946-04-19  Primary Care Physician:[name], MD  Referring Physician: [name], MD [place], [place] [address]  Chief complaint: Heartburn and regurgitation  HPI: 71 year old female with history of chronic GERD and irritable bowel syndrome, intermittent fecal incontinence status post multiple rectal surgeries with incompetent rectal sphincter is here with complaints of worsening heartburn and regurgitation She is currently taking omeprazole 20 mg twice daily but continues to have severe heartburn with epigastric discomfort and regurgitation of liquid and food throughout the day. No recent change in weight. Denies NSAID's. No dysphagia, odynophagia, vomiting, melena or blood per rectum. Review of system is positive for abdominal bloating, belching and intermittent abdominal cramps. Father diagnosed with colon cancer in his early 6s Last colonoscopy April 2014 was unremarkable except for partial prolapsed rectal mucosa. Last EGD in November 2003 showed a 3 cm hiatal hernia, reflux esophagitis and gastritis  Outpatient Encounter Prescriptions as of 03/09/2017  Medication Sig  . acetaminophen (TYLENOL) 500 MG tablet Take 500 mg by mouth as needed.  [name] Kitchen acyclovir (ZOVIRAX) 200 MG capsule Take 5 times a day as needed  . aspirin EC 81 MG tablet Take 81 mg by mouth daily.  [name] Kitchen atorvastatin (LIPITOR) 10 MG tablet Take 10 mg by mouth daily.  . Calcium Carbonate (CALTRATE 600 PO) Take 1 tablet by mouth daily.  . citalopram (CELEXA) 20 MG tablet Take 20 mg by mouth daily.    [name] Kitchen docusate sodium (COLACE) 100 MG capsule Take 1 capsule (100 mg total) by mouth every 12 (twelve) hours.  [name] Kitchen glimepiride (AMARYL) 2 MG tablet   . hyoscyamine (LEVSIN SL) 0.125 MG SL tablet DISSOLVE ONE TABLET IN MOUTH EVERY 6 HOURS AS NEEDED FOR  CRAMPING  . metFORMIN (GLUCOPHAGE) 500 MG tablet Take 1,000 mg by mouth 2 (two) times daily with a meal.     . metoprolol succinate (TOPROL XL) 50 MG 24 hr tablet Take 1 tablet (50 mg total) by mouth daily. Take with or immediately following a meal.  . Multiple Vitamins-Minerals (ICAPS) CAPS Take by mouth.  [name] Kitchen omeprazole (PRILOSEC) 20 MG capsule Take 2 capsules every day 30 minutes before the first meal  . ondansetron (ZOFRAN ODT) 4 MG disintegrating tablet Take 1 tablet (4 mg total) by mouth every 8 (eight) hours as needed for nausea or vomiting.  Vladimir Faster Glycol-Propyl Glycol (SYSTANE OP) Apply 1 drop to eye as needed.  . ranitidine (ZANTAC) 150 MG tablet Take 1 tablet (150 mg total) by mouth at bedtime.  . Vitamin D, Cholecalciferol, 400 units TABS Take by mouth.  . vitamin E (VITAMIN E) 400 UNIT capsule Take 400 Units by mouth daily.  . NONFORMULARY OR COMPOUNDED ITEM [facility]:  Peripheral Neuropathy Cream - Bupivacaine 1%, Doxepin 3%, Gabapentin 6%, Pentoxifylline 3%, Topiramate 1%, apply 1-2 grams to affected area 3-4 times daily. (Patient not taking: Reported on 12/07/2016)   Facility-Administered Encounter Medications as of 03/09/2017  Medication  . betamethasone acetate-betamethasone sodium phosphate (CELESTONE) injection 3 mg    Allergies as of 03/09/2017 - Review Complete 03/09/2017  Allergen Reaction Noted  . Ciprofloxacin Rash 09/04/2009  . Morphine and related Itching 05/07/2011    Past Medical History:  Diagnosis Date  . Basal cell carcinoma   . Benign breast cyst in female    PATIENT HAS HISTORY OF BREAST CYSTS  . Diabetes mellitus   . Herpes progenitalis   .  Hypertension   . Kidney stone   . OSA on CPAP   . Urinary, incontinence, stress female   . Vertigo     Past Surgical History:  Procedure Laterality Date  . ABDOMINAL HYSTERECTOMY  1986   leiomyomata  . Basal cell excised    . breast cystectomy     right  . CHOLECYSTECTOMY  2003  . ear cystectomy    . KNEE ARTHROSCOPY     right  . LITHOTRIPSY    . RECTOVAGINAL FISTULA CLOSURE      Family  History  Problem Relation Age of Onset  . Diabetes Mother   . Diabetes Father   . Colon cancer Father   . Heart disease Father   . Hypertension Father   . Diabetes Sister   . Hypertension Sister   . Diabetes Brother   . Colon polyps Brother   . Breast cancer Paternal Aunt     Age 56's  . Colon polyps Brother   . Heart disease Brother   . Stroke Brother   . Hyperlipidemia Brother   . Colon polyps Sister   . Diabetes Sister   . Hypertension Sister   . Heart disease Sister   . Esophageal cancer Neg Hx   . Rectal cancer Neg Hx   . Stomach cancer Neg Hx     Social History   Social History  . Marital status: Married    Spouse name: N/A  . Number of children: 2  . Years of education: N/A   Occupational History  . Retired Retired   Social History Main Topics  . Smoking status: Former Research scientist (life sciences)  . Smokeless tobacco: Never Used  . Alcohol use Yes     Comment: occasional Rare  . Drug use: No  . Sexual activity: Yes    Birth control/ protection: Surgical   Other Topics Concern  . Not on file   Social History Narrative   Denies caffeine use.      Review of systems: Review of Systems  Constitutional: Negative for fever and chills.  HENT: Negative.   Eyes: Negative for blurred vision.  Respiratory: Negative for cough, shortness of breath and wheezing.   Cardiovascular: Negative for chest pain and palpitations.  Gastrointestinal: as per HPI Genitourinary: Negative for dysuria, urgency, frequency and hematuria.  Musculoskeletal: Negative for myalgias, back pain and joint pain.  Skin: Negative for itching and rash.  Neurological: Negative for dizziness, tremors, focal weakness, seizures and loss of consciousness.  Endo/Heme/Allergies: Positive for seasonal allergies.  Psychiatric/Behavioral: Negative for depression, suicidal ideas and hallucinations.  All other systems reviewed and are negative.   Physical Exam: Vitals:   03/09/17 0843  BP: (!) 152/84  Pulse: 80   Resp: 18   Body mass index is 29.45 kg/m. Gen:      No acute distress HEENT:  EOMI, sclera anicteric Neck:     No masses; no thyromegaly Lungs:    Clear to auscultation bilaterally; normal respiratory effort CV:         Regular rate and rhythm; no murmurs Abd:      + bowel sounds; soft, non-tender; no palpable masses, no distension Ext:    No edema; adequate peripheral perfusion Skin:      Warm and dry; no rash Neuro: alert and oriented x 3 Psych: normal mood and affect  Data Reviewed:  Reviewed labs, radiology imaging, old records and pertinent past GI work up   Assessment and Plan/Recommendations:  71 year old female with history of irritable  bowel syndrome, prolapsed rectum, rectovaginal fistula status post multiple rectal surgeries , chronic GERD here with complaints of worsening heartburn, epigastric pain and regurgitation Increase PPI dose, pantoprazole 40 mg twice daily, 30 minutes before breakfast and dinner Patient is not taking ranitidine, was having headaches and she stopped it Gaviscon after meals as needed Discussed antireflux measures in detail We will schedule for EGD for evaluation of recurrent GERD symptoms despite therapy and exclude any other etiology The risks and benefits as well as alternatives of endoscopic procedure(s) have been discussed and reviewed. All questions answered. The patient agrees to proceed. If continues to have persistent symptoms despite high-dose PPI may have to consider fundoplication  IBS with bloating and dyspepsia: FDGard 1-2 capsules 3 times a day as needed  25 minutes was spent face-to-face with the patient. Greater than 50% of the time used for counseling as well as treatment plan and follow-up. She had multiple questions which were answered to her satisfaction  K. Denzil Magnuson , MD [phone] Mon-Fri 8a-5p [phone] after 5p, weekends, holidays  CC: Rankins, Bill Salinas, MD

## 2017-03-11 ENCOUNTER — Ambulatory Visit (AMBULATORY_SURGERY_CENTER): Payer: Medicare Other | Admitting: Gastroenterology

## 2017-03-11 ENCOUNTER — Encounter: Payer: Self-pay | Admitting: Gastroenterology

## 2017-03-11 VITALS — BP 118/70 | HR 82 | Temp 98.4°F | Resp 20 | Ht 62.0 in | Wt 161.0 lb

## 2017-03-11 DIAGNOSIS — E669 Obesity, unspecified: Secondary | ICD-10-CM | POA: Diagnosis not present

## 2017-03-11 DIAGNOSIS — K219 Gastro-esophageal reflux disease without esophagitis: Secondary | ICD-10-CM | POA: Diagnosis present

## 2017-03-11 DIAGNOSIS — K449 Diaphragmatic hernia without obstruction or gangrene: Secondary | ICD-10-CM | POA: Diagnosis not present

## 2017-03-11 DIAGNOSIS — G4733 Obstructive sleep apnea (adult) (pediatric): Secondary | ICD-10-CM | POA: Diagnosis not present

## 2017-03-11 MED ORDER — SODIUM CHLORIDE 0.9 % IV SOLN: 500.0000 mL | INTRAVENOUS | Status: DC

## 2017-03-11 NOTE — Progress Notes (Signed)
Report to PACU, RN, vss, BBS= Clear.  

## 2017-03-11 NOTE — Op Note (Signed)
[facility] Patient Name: Kristen Mahoney Procedure Date: 03/11/2017 1:41 PM MRN: [id number] Endoscopist: Mauri Pole , MD Age: 71 Referring MD:  Date of Birth: 29-Aug-1946 Gender: Female Account #: [id number] Procedure:                Upper GI endoscopy Indications:              Esophageal reflux symptoms that recur despite                            appropriate therapy, Epigastric abdominal pain,                            Heartburn Medicines:                Monitored Anesthesia Care Procedure:                Pre-Anesthesia Assessment:                           - Prior to the procedure, a History and Physical                            was performed, and patient medications and                            allergies were reviewed. The patient's tolerance of                            previous anesthesia was also reviewed. The risks                            and benefits of the procedure and the sedation                            options and risks were discussed with the patient.                            All questions were answered, and informed consent                            was obtained. Prior Anticoagulants: The patient has                            taken no previous anticoagulant or antiplatelet                            agents. ASA Grade Assessment: III - A patient with                            severe systemic disease. After reviewing the risks                            and benefits, the patient was deemed in  satisfactory condition to undergo the procedure.                           After obtaining informed consent, the endoscope was                            passed under direct vision. Throughout the                            procedure, the patient's blood pressure, pulse, and                            oxygen saturations were monitored continuously. The                            Endoscope was introduced through the mouth,  and                            advanced to the second part of duodenum. The upper                            GI endoscopy was accomplished without difficulty.                            The patient tolerated the procedure well. Scope In: Scope Out: Findings:                 The examined esophagus was normal. Regular Z-line                            at 32cm                           A small hiatal hernia was present. Otherwise                            gastric mucosa appeared normal                           The examined duodenum was normal. Complications:            No immediate complications. Estimated Blood Loss:     Estimated blood loss: none. Impression:               - Normal esophagus.                           - Small hiatal hernia.                           - Normal examined duodenum.                           - No specimens collected. Recommendation:           - Patient has a contact number available for  emergencies. The signs and symptoms of potential                            delayed complications were discussed with the                            patient. Return to normal activities tomorrow.                            Written discharge instructions were provided to the                            patient.                           - Resume previous diet.                           - Continue present medications.                           - No repeat upper endoscopy.                           - Continue PPI and antireflux measures                           - Return to GI office PRN. Mauri Pole, MD 03/11/2017 1:55:56 PM This report has been signed electronically.

## 2017-03-11 NOTE — Patient Instructions (Signed)
YOU HAD AN ENDOSCOPIC PROCEDURE TODAY AT [facility]:   Refer to the procedure report that was given to you for any specific questions about what was found during the examination.  If the procedure report does not answer your questions, please call your gastroenterologist to clarify.  If you requested that your care partner not be given the details of your procedure findings, then the procedure report has been included in a sealed envelope for you to review at your convenience later.  YOU SHOULD EXPECT: Some feelings of bloating in the abdomen. Passage of more gas than usual.  Walking can help get rid of the air that was put into your GI tract during the procedure and reduce the bloating. If you had a lower endoscopy (such as a colonoscopy or flexible sigmoidoscopy) you may notice spotting of blood in your stool or on the toilet paper. If you underwent a bowel prep for your procedure, you may not have a normal bowel movement for a few days.  Please Note:  You might notice some irritation and congestion in your nose or some drainage.  This is from the oxygen used during your procedure.  There is no need for concern and it should clear up in a day or so.  SYMPTOMS TO REPORT IMMEDIATELY:   Following lower endoscopy (colonoscopy or flexible sigmoidoscopy):  Excessive amounts of blood in the stool  Significant tenderness or worsening of abdominal pains  Swelling of the abdomen that is new, acute  Fever of 100F or higher   Following upper endoscopy (EGD)  Vomiting of blood or coffee ground material  New chest pain or pain under the shoulder blades  Painful or persistently difficult swallowing  New shortness of breath  Fever of 100F or higher  Black, tarry-looking stools  For urgent or emergent issues, a gastroenterologist can be reached at any hour by calling [phone].   DIET:  We do recommend a small meal at first, but then you may proceed to your regular diet.  Drink  plenty of fluids but you should avoid alcoholic beverages for 24 hours.  ACTIVITY:  You should plan to take it easy for the rest of today and you should NOT DRIVE or use heavy machinery until tomorrow (because of the sedation medicines used during the test).    FOLLOW UP: Our staff will call the number listed on your records the next business day following your procedure to check on you and address any questions or concerns that you may have regarding the information given to you following your procedure. If we do not reach you, we will leave a message.  However, if you are feeling well and you are not experiencing any problems, there is no need to return our call.  We will assume that you have returned to your regular daily activities without incident.  If any biopsies were taken you will be contacted by phone or by letter within the next 1-3 weeks.  Please call us at [phone] if you have not heard about the biopsies in 3 weeks.    SIGNATURES/CONFIDENTIALITY: You and/or your care partner have signed paperwork which will be entered into your electronic medical record.  These signatures attest to the fact that that the information above on your After Visit Summary has been reviewed and is understood.  Full responsibility of the confidentiality of this discharge information lies with you and/or your care-partner.YOU HAD AN ENDOSCOPIC PROCEDURE TODAY AT [facility]:  Refer to the procedure report that was given to you for any specific questions about what was found during the examination.  If the procedure report does not answer your questions, please call your gastroenterologist to clarify.  If you requested that your care partner not be given the details of your procedure findings, then the procedure report has been included in a sealed envelope for you to review at your convenience later.  YOU SHOULD EXPECT: Some feelings of bloating in the abdomen. Passage of more gas than  usual.  Walking can help get rid of the air that was put into your GI tract during the procedure and reduce the bloating. If you had a lower endoscopy (such as a colonoscopy or flexible sigmoidoscopy) you may notice spotting of blood in your stool or on the toilet paper. If you underwent a bowel prep for your procedure, you may not have a normal bowel movement for a few days.  Please Note:  You might notice some irritation and congestion in your nose or some drainage.  This is from the oxygen used during your procedure.  There is no need for concern and it should clear up in a day or so.  SYMPTOMS TO REPORT IMMEDIATELY:    Following upper endoscopy (EGD)  Vomiting of blood or coffee ground material  New chest pain or pain under the shoulder blades  Painful or persistently difficult swallowing  New shortness of breath  Fever of 100F or higher  Black, tarry-looking stools  For urgent or emergent issues, a gastroenterologist can be reached at any hour by calling [phone].   DIET:  We do recommend a small meal at first, but then you may proceed to your regular diet.  Drink plenty of fluids but you should avoid alcoholic beverages for 24 hours.  ACTIVITY:  You should plan to take it easy for the rest of today and you should NOT DRIVE or use heavy machinery until tomorrow (because of the sedation medicines used during the test).    FOLLOW UP: Our staff will call the number listed on your records the next business day following your procedure to check on you and address any questions or concerns that you may have regarding the information given to you following your procedure. If we do not reach you, we will leave a message.  However, if you are feeling well and you are not experiencing any problems, there is no need to return our call.  We will assume that you have returned to your regular daily activities without incident.  If any biopsies were taken you will be contacted by phone or by  letter within the next 1-3 weeks.  Please call us at [phone] if you have not heard about the biopsies in 3 weeks.    SIGNATURES/CONFIDENTIALITY: You and/or your care partner have signed paperwork which will be entered into your electronic medical record.  These signatures attest to the fact that that the information above on your After Visit Summary has been reviewed and is understood.  Full responsibility of the confidentiality of this discharge information lies with you and/or your care-partner.  Resume your previous medications.  Read all of the handouts given to you by your recovery room nurse.

## 2017-03-11 NOTE — Telephone Encounter (Signed)
Patient never returned call   She can get FDGARD from [facility] or [facility]

## 2017-03-14 ENCOUNTER — Telehealth: Payer: Self-pay | Admitting: *Deleted

## 2017-03-14 NOTE — Telephone Encounter (Signed)
  Follow up Call-  Call back number 03/11/2017  Post procedure Call Back phone  # [phone]  Permission to leave phone message Yes  Some recent data might be hidden     Patient questions:  Do you have a fever, pain , or abdominal swelling? No. Pain Score  0 *  Have you tolerated food without any problems? Yes.    Have you been able to return to your normal activities? Yes.    Do you have any questions about your discharge instructions: Diet   No. Medications  No. Follow up visit  No.  Do you have questions or concerns about your Care? No.  Actions: * If pain score is 4 or above: No action needed, pain <4. Pt states she has had some diarrhea over the weekend that is improving and she questioned if the EGD caused that. Instructed her should not have , she had no prep. She states is much better. Instructed her to monitor and if worsens call and let us know. Lenard Galloway  RN

## 2017-03-24 NOTE — Telephone Encounter (Signed)
Patient zantac was sent to pharmacy 01/19/17. No other notes needed.

## 2017-04-02 DIAGNOSIS — M25562 Pain in left knee: Secondary | ICD-10-CM | POA: Diagnosis not present

## 2017-04-04 ENCOUNTER — Encounter: Payer: Self-pay | Admitting: Women's Health

## 2017-04-04 ENCOUNTER — Ambulatory Visit (INDEPENDENT_AMBULATORY_CARE_PROVIDER_SITE_OTHER): Payer: Medicare Other | Admitting: Women's Health

## 2017-04-04 VITALS — BP 134/70

## 2017-04-04 DIAGNOSIS — N898 Other specified noninflammatory disorders of vagina: Secondary | ICD-10-CM

## 2017-04-04 DIAGNOSIS — L298 Other pruritus: Secondary | ICD-10-CM

## 2017-04-04 DIAGNOSIS — A5901 Trichomonal vulvovaginitis: Secondary | ICD-10-CM

## 2017-04-04 DIAGNOSIS — R350 Frequency of micturition: Secondary | ICD-10-CM

## 2017-04-04 LAB — URINALYSIS W MICROSCOPIC + REFLEX CULTURE
BILIRUBIN URINE: NEGATIVE
CASTS: NONE SEEN [LPF]
Crystals: NONE SEEN [HPF]
Glucose, UA: NEGATIVE
KETONES UR: NEGATIVE
NITRITE: NEGATIVE
PH: 5.5 (ref 5.0–8.0)
SPECIFIC GRAVITY, URINE: 1.025 (ref 1.001–1.035)
WBC, UA: >60 WBC/HPF — AB (ref <=5)
YEAST: NONE SEEN [HPF]

## 2017-04-04 LAB — WET PREP FOR TRICH, YEAST, CLUE: Yeast Wet Prep HPF POC: NONE SEEN

## 2017-04-04 MED ORDER — METRONIDAZOLE 500 MG PO TABS: ORAL_TABLET | ORAL | 1 refills | Status: DC

## 2017-04-04 NOTE — Progress Notes (Signed)
Presents with complaint of increased vaginal discharge with irritation, itching, mild odor, slight urinary frequency without pain or burning. Denies back pain, pain at end of stream of urination or fever. TVH for fibroids on no HRT.   Exam: Appears well. No CVAT. External genitalia mild  erythema at introitus, wet prep done with a Q-tip, positive for Trichomonas, TNTC bacteria. UA: Trace blood, +3 leukocytes, greater than 60 WBCs, 6-10 squamous epithelials, many bacteria, few Trichomonas  Trichomonas  Plan: Flagyl 2 g by mouth 1 dose, alcohol precautions reviewed, refill for husband. Husband continues to assert faithfulness. Reviewed importance of him to take and continue condom use. Instructed to call if no relief of symptoms. Urine culture pending. GC/chlamydia from urine pending. Denies need for HIV hepatitis or RPR. Has had numerous STD checks in the past all negative other than Trichomonas.

## 2017-04-04 NOTE — Patient Instructions (Signed)
Preventing Sexually Transmitted Infections, Adult Sexually transmitted infections (STIs) are diseases that are passed (transmitted) from person to person through bodily fluids exchanged during sex or sexual contact. Bodily fluids include saliva, semen, blood, vaginal mucus, and urine. You may have an increased risk for developing an STI if you have unprotected oral, vaginal, or anal sex. Some common STIs include:  Herpes.  Hepatitis B.  Chlamydia.  Gonorrhea.  Syphilis.  HPV (human papillomavirus).  HIV (humanimmunodeficiency virus), the virus that can cause AIDS (acquired immunodeficiency virus). How can I protect myself from sexually transmitted infections? The only way to completely prevent STIs is not to have sex of any kind (practice abstinence). This includes oral, vaginal, or anal sex. If you are sexually active, take these actions to lower your risk of getting an STI:  Have only one sex partner (be monogamous) or limit the number of sexual partners you have.  Stay up-to-date on immunizations. Certain vaccines can lower your risk of getting certain STIs, such as:  Hepatitis A and B vaccines. You may have been vaccinated as a young child, but likely need a booster shot as a teen or young adult.  HPV vaccine. This vaccine is recommended if you are a man under age 35 or a woman under age 80.  Use methods that prevent the exchange of body fluids between partners (barrier protection) every time you have sex. Barrier protection can be used during oral, vaginal, or anal sex. Commonly used barrier methods include:  Female condom.  Female condom.  Dental dam.  Get tested regularly for STIs. Have your sexual partner get tested regularly as well.  Avoid mixing alcohol, drugs, and sex. Alcohol and drug use can affect your ability to make good decisions and can lead to risky sexual behaviors.  Ask your health care provider about taking pre-exposure prophylaxis (PrEP) to prevent HIV  infection if you:  Have a HIV-positive sexual partner.  Have multiple sexual partners or partners who do not know their HIV status, and do not regularly use a condom during sex.  Use injection drugs and share needles. Birth control pills, injections, implants, and intrauterine devices (IUDs) do not protect against STIs. To prevent both STIs and pregnancy, always use a condom with another form of birth control. Some STIs, such as herpes, are spread through skin to skin contact. A condom does not protect you from getting such STIs. If you or your partner have herpes and there is an active flare with open sores, avoid all sexual contact. Why are these changes important? Taking steps to practice safe sex protects you and others. Many STIs can be cured. However, some STIs are not curable and will affect you for the rest of your life. STIs can be passed on to another person even if you do not have symptoms. What can happen if changes are not made? Certain STIs may:  Require you to take medicine for the rest of your life.  Affect your ability to have children (your fertility).  Increase your risk for developing another STI or certain serious health conditions, such as:  Cervical cancer.  Head and neck cancer.  Pelvic inflammatory disease (PID) in women.  Organ damage or damage to other parts of your body, if the infection spreads.  Be passed to a baby during childbirth. How are sexually transmitted infections treated? If you or your partner know or think that you may have an STI:  Talk with your healthcare provider about what can be done to treat  it. Some STIs can be treated and cured with medicines.  For curable STIs, you and your partner should avoid sex during treatment and for several days after treatment is complete.  You and your partner should both be treated at the same time, if there is any chance that your partner is infected as well. If you get treatment but your partner does  not, your partner can re-infect you when you resume sexual contact.  Do not have unprotected sex. Where to find more information: Learn more about sexually transmitted diseases and infections from:  Centers for Disease Control and Prevention:  More information about specific STIs: AppraiserFraud.fi  Find places to get sexual health counseling and treatment for free or for a low cost: gettested.StoreMirror.com.cy  [place] Department of Health and Human Services: http://white.info/.html Summary  The only way to completely prevent STIs is not to have sex (practice abstinence), including oral, vaginal, or anal sex.  STIs can spread through saliva, semen, blood, vaginal mucus, urine, or sexual contact.  If you do have sex, limit your number of sexual partners and use a barrier protection method every time you have sex.  If you develop an STI, get treated right away and ask your partner to be treated as well. Do not resume having sex until both of you have completed treatment for the STI. This information is not intended to replace advice given to you by your health care provider. Make sure you discuss any questions you have with your health care provider. Document Released: 12/09/2016 Document Revised: 12/09/2016 Document Reviewed: 12/09/2016 Elsevier Interactive Patient Education  2017 [employer].

## 2017-04-05 LAB — URINE CULTURE

## 2017-04-05 LAB — GC/CHLAMYDIA PROBE AMP
CT Probe RNA: NOT DETECTED
GC Probe RNA: NOT DETECTED

## 2017-04-06 ENCOUNTER — Other Ambulatory Visit: Payer: Self-pay | Admitting: Women's Health

## 2017-04-06 DIAGNOSIS — R8271 Bacteriuria: Secondary | ICD-10-CM

## 2017-04-07 ENCOUNTER — Telehealth: Payer: Self-pay | Admitting: Gastroenterology

## 2017-04-07 NOTE — Telephone Encounter (Signed)
Patient agrees with this plan. She will call in a month with her progress before decreasing the Pantoprazole.

## 2017-04-07 NOTE — Telephone Encounter (Signed)
Patient is on Pantoprazole 40mg  BID. She has had improvement in her symptoms and feels she is responding. She is not 100% symptoms free yet. Does she need to continue the Pantoprazole? Thank you

## 2017-04-07 NOTE — Telephone Encounter (Signed)
Please have her continue for 1 more month and then she can taper down to once daily with Ranitidine 150mg  at bedtime as needed

## 2017-04-08 ENCOUNTER — Other Ambulatory Visit: Payer: Medicare Other

## 2017-04-08 DIAGNOSIS — R8271 Bacteriuria: Secondary | ICD-10-CM

## 2017-04-09 LAB — URINALYSIS W MICROSCOPIC + REFLEX CULTURE
Bacteria, UA: NONE SEEN [HPF]
Bilirubin Urine: NEGATIVE
CASTS: NONE SEEN [LPF]
CRYSTALS: NONE SEEN [HPF]
Glucose, UA: NEGATIVE
HGB URINE DIPSTICK: NEGATIVE
Ketones, ur: NEGATIVE
Leukocytes, UA: NEGATIVE
NITRITE: NEGATIVE
RBC / HPF: NONE SEEN RBC/HPF (ref <=2)
Specific Gravity, Urine: 1.025 (ref 1.001–1.035)
WBC, UA: NONE SEEN WBC/HPF (ref <=5)
YEAST: NONE SEEN [HPF]
pH: 6 (ref 5.0–8.0)

## 2017-04-13 ENCOUNTER — Encounter: Payer: Self-pay | Admitting: Gynecology

## 2017-04-13 DIAGNOSIS — Z1231 Encounter for screening mammogram for malignant neoplasm of breast: Secondary | ICD-10-CM | POA: Diagnosis not present

## 2017-04-20 DIAGNOSIS — L282 Other prurigo: Secondary | ICD-10-CM | POA: Diagnosis not present

## 2017-04-20 DIAGNOSIS — Z85828 Personal history of other malignant neoplasm of skin: Secondary | ICD-10-CM | POA: Diagnosis not present

## 2017-04-20 DIAGNOSIS — L308 Other specified dermatitis: Secondary | ICD-10-CM | POA: Diagnosis not present

## 2017-04-21 DIAGNOSIS — L237 Allergic contact dermatitis due to plants, except food: Secondary | ICD-10-CM | POA: Diagnosis not present

## 2017-04-21 DIAGNOSIS — H16223 Keratoconjunctivitis sicca, not specified as Sjogren's, bilateral: Secondary | ICD-10-CM | POA: Diagnosis not present

## 2017-04-27 ENCOUNTER — Encounter: Payer: Self-pay | Admitting: Women's Health

## 2017-04-27 ENCOUNTER — Ambulatory Visit (INDEPENDENT_AMBULATORY_CARE_PROVIDER_SITE_OTHER): Payer: Medicare Other | Admitting: Women's Health

## 2017-04-27 VITALS — BP 138/80

## 2017-04-27 DIAGNOSIS — B9689 Other specified bacterial agents as the cause of diseases classified elsewhere: Secondary | ICD-10-CM | POA: Diagnosis not present

## 2017-04-27 DIAGNOSIS — N760 Acute vaginitis: Secondary | ICD-10-CM

## 2017-04-27 LAB — WET PREP FOR TRICH, YEAST, CLUE
TRICH WET PREP: NONE SEEN
WBC, Wet Prep HPF POC: NONE SEEN
YEAST WET PREP: NONE SEEN

## 2017-04-27 MED ORDER — METRONIDAZOLE 500 MG PO TABS: 500.0000 mg | ORAL_TABLET | Freq: Two times a day (BID) | ORAL | 0 refills | Status: DC

## 2017-04-27 NOTE — Progress Notes (Signed)
Presents today for follow up  for rash on her lower abdomen and under breast which is resolving. Dermatology consulted and treated with Valisone. Treated for Trichomonas 2 weeks ago and wants to be sure infection is cleared. Husband treated. No complaints of vaginal discharge, itching or irritation or urinary symptoms. TVH for fibroids, on no HRT, hot flushes present.   Exam: Appears well. Has several small erythematous patches on upper abdomen and under pannus that appear to be healing. External genitalia normal. Wet prep: clue cells moderate, bacteria TNTC,   Bacterial Vaginosis Resolving rash  Plan: Flagyl 500 mg bid for 7 days, alcohol precaution reviewed. Instructed to call if any problems.

## 2017-04-27 NOTE — Patient Instructions (Signed)

## 2017-04-27 NOTE — Addendum Note (Signed)
Addended by: Burnett Kanaris on: 04/27/2017 01:57 PM   Modules accepted: Orders

## 2017-04-29 ENCOUNTER — Emergency Department (HOSPITAL_COMMUNITY)
Admission: EM | Admit: 2017-04-29 | Discharge: 2017-04-29 | Disposition: A | Discharge status: Home / Self Care | Payer: Medicare Other | Attending: Emergency Medicine | Admitting: Emergency Medicine

## 2017-04-29 ENCOUNTER — Encounter (HOSPITAL_COMMUNITY): Payer: Self-pay

## 2017-04-29 DIAGNOSIS — R109 Unspecified abdominal pain: Secondary | ICD-10-CM | POA: Diagnosis not present

## 2017-04-29 DIAGNOSIS — E119 Type 2 diabetes mellitus without complications: Secondary | ICD-10-CM | POA: Insufficient documentation

## 2017-04-29 DIAGNOSIS — I10 Essential (primary) hypertension: Secondary | ICD-10-CM | POA: Insufficient documentation

## 2017-04-29 DIAGNOSIS — Z7984 Long term (current) use of oral hypoglycemic drugs: Secondary | ICD-10-CM | POA: Insufficient documentation

## 2017-04-29 DIAGNOSIS — R110 Nausea: Secondary | ICD-10-CM | POA: Insufficient documentation

## 2017-04-29 DIAGNOSIS — Z7982 Long term (current) use of aspirin: Secondary | ICD-10-CM | POA: Insufficient documentation

## 2017-04-29 DIAGNOSIS — Z87891 Personal history of nicotine dependence: Secondary | ICD-10-CM | POA: Insufficient documentation

## 2017-04-29 DIAGNOSIS — Z79899 Other long term (current) drug therapy: Secondary | ICD-10-CM | POA: Insufficient documentation

## 2017-04-29 LAB — CBC
HEMATOCRIT: 42.4 % (ref 36.0–46.0)
HEMOGLOBIN: 14.1 g/dL (ref 12.0–15.0)
MCH: 29.4 pg (ref 26.0–34.0)
MCHC: 33.3 g/dL (ref 30.0–36.0)
MCV: 88.3 fL (ref 78.0–100.0)
Platelets: 215 10*3/uL (ref 150–400)
RBC: 4.8 MIL/uL (ref 3.87–5.11)
RDW: 15 % (ref 11.5–15.5)
WBC: 6.6 10*3/uL (ref 4.0–10.5)

## 2017-04-29 LAB — POC OCCULT BLOOD, ED: Fecal Occult Bld: NEGATIVE

## 2017-04-29 LAB — COMPREHENSIVE METABOLIC PANEL
ALT: 11 U/L — ABNORMAL LOW (ref 14–54)
ANION GAP: 13 (ref 5–15)
AST: 19 U/L (ref 15–41)
Albumin: 4.1 g/dL (ref 3.5–5.0)
Alkaline Phosphatase: 89 U/L (ref 38–126)
BILIRUBIN TOTAL: 0.7 mg/dL (ref 0.3–1.2)
BUN: 14 mg/dL (ref 6–20)
CHLORIDE: 107 mmol/L (ref 101–111)
CO2: 21 mmol/L — ABNORMAL LOW (ref 22–32)
Calcium: 9.1 mg/dL (ref 8.9–10.3)
Creatinine, Ser: 0.83 mg/dL (ref 0.44–1.00)
Glucose, Bld: 179 mg/dL — ABNORMAL HIGH (ref 65–99)
POTASSIUM: 3.5 mmol/L (ref 3.5–5.1)
Sodium: 141 mmol/L (ref 135–145)
TOTAL PROTEIN: 7.3 g/dL (ref 6.5–8.1)

## 2017-04-29 LAB — LIPASE, BLOOD: LIPASE: 40 U/L (ref 11–51)

## 2017-04-29 MED ORDER — ONDANSETRON HCL 4 MG/2ML IJ SOLN
4.0000 mg | Freq: Once | INTRAMUSCULAR | Status: AC | PRN
  Administered 2017-04-29: 4 mg via INTRAVENOUS
  Filled 2017-04-29: qty 2

## 2017-04-29 MED ORDER — ONDANSETRON HCL 4 MG PO TABS: 4.0000 mg | ORAL_TABLET | Freq: Four times a day (QID) | ORAL | 0 refills | Status: DC

## 2017-04-29 NOTE — Discharge Instructions (Signed)
Take Zofran as needed for nausea. Take all medications as previously prescribed. Follow-up with PCP for further evaluation if needed. Return to ED for worsening pain, increased blood in stool, blood in vomit, fever, lightheadedness, loss of consciousness.

## 2017-04-29 NOTE — ED Provider Notes (Signed)
[facility] DEPT Provider Note   CSN: [id number] Arrival date & time: 04/29/17  7341     History   Chief Complaint Chief Complaint  Patient presents with  . Nausea    HPI Kristen Mahoney is a 71 y.o. female.  Patient presents with 3 day history of nausea and no vomiting. She also states that she has noticed dark red blood in her stools which have been looser than usual. She states that she has a history of blood in her stools in the past which resolve after a few days, she is unsure if this is caused by hemorrhoids or something else. She states that the last time she saw blood in her stool was yesterday. She is also complaining of generalized abdominal pain radiating throughout the left and right side. She states describes it as a soreness. She was recently prescribed Flagyl for bacterial vaginosis, after which symptoms began. She denies alcohol use with the Flagyl. States this is her first time taking the medication. She has been able to eat and drink normally. Patient reports hearing "bubbly noises" in her abdomen and increased passage of gas. Patient reports colonoscopies and endoscopies in the past have been normal. She denies any urinary complaints, vaginal complaints, vomiting, lightheadedness, fevers, chest pain.      Past Medical History:  Diagnosis Date  . Basal cell carcinoma   . Benign breast cyst in female    PATIENT HAS HISTORY OF BREAST CYSTS  . Diabetes mellitus   . Herpes progenitalis   . Hypertension   . Kidney stone   . OSA on CPAP   . Urinary, incontinence, stress female   . Vertigo     Patient Active Problem List   Diagnosis Date Noted  . 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 OSA on CPAP 07/13/2016  . Trichomonas vaginitis 08/12/2015  . Kidney stone   . DIARRHEA, INFECTIOUS 09/04/2009  . BLOOD IN STOOL-MELENA 09/04/2009  . Abdominal pain, left lower quadrant 09/04/2009  . BLOOD IN STOOL, OCCULT 09/04/2009  . HYPERLIPIDEMIA 12/25/2008  . ANXIETY  12/25/2008  . DEPRESSION 12/25/2008  . GERD 12/25/2008  . HIATAL HERNIA 12/25/2008  . Diverticulosis of colon (without mention of hemorrhage) 12/25/2008  . IRRITABLE BOWEL SYNDROME 12/25/2008  . RECTOVAGINAL FISTULA 12/25/2008  . MITRAL VALVE PROLAPSE, HX OF 12/25/2008    Past Surgical History:  Procedure Laterality Date  . ABDOMINAL HYSTERECTOMY  1986   leiomyomata  . Basal cell excised    . breast cystectomy     right  . CHOLECYSTECTOMY  2003  . ear cystectomy    . KNEE ARTHROSCOPY     right  . LITHOTRIPSY    . RECTOVAGINAL FISTULA CLOSURE      OB History    Gravida Para Term Preterm AB Living   3 2 2   1 2    SAB TAB Ectopic Multiple Live Births   1               Home Medications    Prior to Admission medications   Medication Sig Start Date End Date Taking? Authorizing Provider  acyclovir (ZOVIRAX) 200 MG capsule Take 5 times a day as needed Patient taking differently: Take 200 mg by mouth daily.  11/07/15  Yes [name], NP  aspirin EC 81 MG tablet Take 81 mg by mouth every evening.    Yes Historical Provider, MD  atorvastatin (LIPITOR) 10 MG tablet Take 10 mg by mouth daily. 02/14/17  Yes Historical Provider, MD  betamethasone dipropionate (DIPROLENE) 0.05 % cream Apply topically 2 (two) times daily.   Yes Historical Provider, MD  bismuth subsalicylate (PEPTO BISMOL) 262 MG/15ML suspension Take 30 mLs by mouth every 6 (six) hours as needed for indigestion.   Yes Historical Provider, MD  citalopram (CELEXA) 20 MG tablet Take 20 mg by mouth daily.     Yes Historical Provider, MD  hydrocortisone 2.5 % cream Apply topically 2 (two) times daily.   Yes Historical Provider, MD  loratadine (CLARITIN) 10 MG tablet Take 10 mg by mouth daily.   Yes Historical Provider, MD  Melatonin CR (MELADOX) 3 MG TBCR Take 1 tablet by mouth daily.   Yes Historical Provider, MD  metFORMIN (GLUCOPHAGE) 500 MG tablet Take 1,000 mg by mouth 2 (two) times daily with a meal.    Yes  Historical Provider, MD  metoprolol succinate (TOPROL XL) 50 MG 24 hr tablet Take 1 tablet (50 mg total) by mouth daily. Take with or immediately following a meal. 12/29/12  Yes Alfonzo Beers, MD  metroNIDAZOLE (FLAGYL) 500 MG tablet Take 1 tablet (500 mg total) by mouth 2 (two) times daily. 04/27/17  Yes [name], NP  Multiple Vitamins-Minerals (ICAPS) CAPS Take by mouth.   Yes Historical Provider, MD  pantoprazole (PROTONIX) 40 MG tablet Take 1 tablet (40 mg total) by mouth 2 (two) times daily before a meal. 03/09/17  Yes Mauri Pole, MD  Polyethyl Glycol-Propyl Glycol (SYSTANE OP) Place 1 drop into both eyes daily as needed (dry eyes).    Yes Historical Provider, MD  prednisoLONE acetate (PRED FORTE) 1 % ophthalmic suspension Place 1 drop into the left eye 2 (two) times daily.    Yes Historical Provider, MD  ranitidine (ZANTAC) 150 MG tablet Take 1 tablet (150 mg total) by mouth at bedtime. Patient taking differently: Take 150 mg by mouth daily as needed for heartburn.  01/19/17  Yes Mauri Pole, MD  Vitamin D, Cholecalciferol, 400 units TABS Take by mouth.   Yes Historical Provider, MD  vitamin E (VITAMIN E) 400 UNIT capsule Take 400 Units by mouth daily.   Yes Historical Provider, MD  docusate sodium (COLACE) 100 MG capsule Take 1 capsule (100 mg total) by mouth every 12 (twelve) hours. Patient not taking: Reported on 04/29/2017 05/09/16   Kristen N Ward, DO  ondansetron (ZOFRAN) 4 MG tablet Take 1 tablet (4 mg total) by mouth every 6 (six) hours. 04/29/17   Delia Heady, PA-C    Family History Family History  Problem Relation Age of Onset  . Diabetes Mother   . Diabetes Father   . Colon cancer Father   . Heart disease Father   . Hypertension Father   . Diabetes Sister   . Hypertension Sister   . Diabetes Brother   . Colon polyps Brother   . Breast cancer Paternal Aunt     Age 60's  . Colon polyps Brother   . Heart disease Brother   . Stroke Brother   . Hyperlipidemia  Brother   . Colon polyps Sister   . Diabetes Sister   . Hypertension Sister   . Heart disease Sister   . Esophageal cancer Neg Hx   . Rectal cancer Neg Hx   . Stomach cancer Neg Hx     Social History Social History  Substance Use Topics  . Smoking status: Former Research scientist (life sciences)  . Smokeless tobacco: Never Used  . Alcohol use Yes     Comment: occasional Rare  Allergies   Ciprofloxacin and Morphine and related   Review of Systems Review of Systems  Constitutional: Negative for appetite change, chills and fever.  HENT: Negative for rhinorrhea, sneezing and sore throat.   Eyes: Negative for photophobia and visual disturbance.  Respiratory: Negative for cough, chest tightness, shortness of breath and wheezing.   Cardiovascular: Negative for chest pain and palpitations.  Gastrointestinal: Positive for abdominal pain, blood in stool, diarrhea and nausea. Negative for constipation and vomiting.  Endocrine: Negative for polyuria.  Genitourinary: Negative for dysuria, hematuria, urgency and vaginal bleeding.  Musculoskeletal: Negative for myalgias.  Skin: Negative for rash.  Neurological: Negative for dizziness, weakness, light-headedness and headaches.     Physical Exam Updated Vital Signs BP (!) 128/49   Pulse 87   Temp 98 F (36.7 C) (Oral)   Resp 13   SpO2 99%   Physical Exam  Constitutional: She appears well-developed and well-nourished. No distress.  HENT:  Head: Normocephalic and atraumatic.  Nose: Nose normal.  Eyes: Conjunctivae and EOM are normal. Right eye exhibits no discharge. Left eye exhibits no discharge. No scleral icterus.  Neck: Normal range of motion. Neck supple.  Cardiovascular: Normal rate, regular rhythm, normal heart sounds and intact distal pulses.  Exam reveals no gallop and no friction rub.   No murmur heard. Pulmonary/Chest: Effort normal and breath sounds normal. No respiratory distress.  Abdominal: Soft. Bowel sounds are normal. She exhibits  no distension. There is tenderness (Tenderness throughout the mid abdomen from left to right side). There is no rebound and no guarding.  Genitourinary: Rectal exam shows guaiac negative stool.  Genitourinary Comments: No external hemorrhoids noted.  Musculoskeletal: Normal range of motion. She exhibits no edema.  Neurological: She is alert. No sensory deficit. She exhibits normal muscle tone. Coordination normal.  Skin: Skin is warm and dry. No rash noted.  Psychiatric: She has a normal mood and affect.  Nursing note and vitals reviewed.    ED Treatments / Results  Labs (all labs ordered are listed, but only abnormal results are displayed) Labs Reviewed  COMPREHENSIVE METABOLIC PANEL - Abnormal; Notable for the following:       Result Value   CO2 21 (*)    Glucose, Bld 179 (*)    ALT 11 (*)    All other components within normal limits  LIPASE, BLOOD  CBC  POC OCCULT BLOOD, ED    EKG  EKG Interpretation  Date/Time:  Friday Apr 29 2017 09:50:45 EDT Ventricular Rate:  94 PR Interval:    QRS Duration: 90 QT Interval:  372 QTC Calculation: 466 R Axis:   81 Text Interpretation:  Sinus rhythm LAE, consider biatrial enlargement Borderline right axis deviation Borderline low voltage, extremity leads No significant change since last tracing Confirmed by [facility] MD, PEDRO ([id number]) on 04/29/2017 10:50:53 AM       Radiology No results found.  Procedures Procedures (including critical care time)  Medications Ordered in ED Medications  ondansetron (ZOFRAN) injection 4 mg (4 mg Intravenous Given 04/29/17 0947)     Initial Impression / Assessment and Plan / ED Course  I have reviewed the triage vital signs and the nursing notes.  Pertinent labs & imaging results that were available during my care of the patient were reviewed by me and considered in my medical decision making (see chart for details).     Patient's history and symptoms concerning for nausea due to antibiotic use  versus UTI versus less likely ACS versus colitis versus gastroenteritis.  Hemoccult was negative. CBC, CMP, lipase were all within normal limits. EKG showed no acute changes since previous tracings. Low likelihood ACS considering history and location of pain. Patient does not appear toxic or systemically ill at this time. No signs of surgical abdomen, as soft, not severely tender. She is afebrile with no leukocytosis. She does have a history of abdominal surgeries in the past including hysterectomy and cholecystectomy. No concern for obstruction at this time because patient is having bowel movements, passing gas. Symptoms likely due to nausea caused by medication use. Patient was educated on eating a meal with antibiotics for remainder of course and avoiding alcohol. Will give Zofran for symptomatic improvement of nausea. Advised to follow up with PCP for further evaluation if needed. Patient reassured that stool was Hemoccult negative. Successful by mouth challenge before discharge.  Return precautions given.   Final Clinical Impressions(s) / ED Diagnoses   Final diagnoses:  Nausea    New Prescriptions New Prescriptions   ONDANSETRON (ZOFRAN) 4 MG TABLET    Take 1 tablet (4 mg total) by mouth every 6 (six) hours.     Delia Heady, PA-C 04/29/17 1216    Fatima Blank, MD 04/29/17 1902

## 2017-04-29 NOTE — ED Notes (Signed)
Patient given sprite and graham cracker to fluid/PO challenge

## 2017-04-29 NOTE — ED Triage Notes (Signed)
Pt presents for evaluation of nausea x 3 days. Pt reports no vomiting. Pt reports had multiple episodes of diarrhea yesterday, dark in color. States noticed dark blood in stool this AM but does have hx of hemorrhoids. Pt reports has had BM since and there was no blood. Pt reports recently dx with bacterial vaginal infection and placed on abx 2 days ago.

## 2017-05-02 DIAGNOSIS — K219 Gastro-esophageal reflux disease without esophagitis: Secondary | ICD-10-CM | POA: Diagnosis not present

## 2017-05-02 DIAGNOSIS — R232 Flushing: Secondary | ICD-10-CM | POA: Diagnosis not present

## 2017-05-02 DIAGNOSIS — E785 Hyperlipidemia, unspecified: Secondary | ICD-10-CM | POA: Diagnosis not present

## 2017-05-11 ENCOUNTER — Encounter: Payer: Self-pay | Admitting: Gynecology

## 2017-05-11 ENCOUNTER — Telehealth: Payer: Self-pay | Admitting: Gastroenterology

## 2017-05-11 NOTE — Telephone Encounter (Signed)
Please advise patient to do CBC and BMP. CT abd & pelvis with contrast. Please check if any appointments available to bring in patient for urgent visit. Thanks

## 2017-05-11 NOTE — Telephone Encounter (Signed)
Pt states she has abd pain, nausea and cramping that started this morning. Has a history of diverticulitis.  Took levbid and that did help some.  Pain is generalized but worse on the left side. Some BR rectal bleeding.  No fever.  Pt thinks it is a diverticular flare.  Please advise.

## 2017-05-12 ENCOUNTER — Other Ambulatory Visit (INDEPENDENT_AMBULATORY_CARE_PROVIDER_SITE_OTHER): Payer: Medicare Other

## 2017-05-12 ENCOUNTER — Ambulatory Visit (INDEPENDENT_AMBULATORY_CARE_PROVIDER_SITE_OTHER)
Admission: RE | Admit: 2017-05-12 | Discharge: 2017-05-12 | Disposition: A | Discharge status: Home / Self Care | Payer: Medicare Other | Source: Ambulatory Visit | Attending: Gastroenterology | Admitting: Gastroenterology

## 2017-05-12 ENCOUNTER — Other Ambulatory Visit: Payer: Self-pay

## 2017-05-12 DIAGNOSIS — R1030 Lower abdominal pain, unspecified: Secondary | ICD-10-CM

## 2017-05-12 DIAGNOSIS — K625 Hemorrhage of anus and rectum: Secondary | ICD-10-CM | POA: Diagnosis not present

## 2017-05-12 DIAGNOSIS — R109 Unspecified abdominal pain: Secondary | ICD-10-CM | POA: Diagnosis not present

## 2017-05-12 LAB — CBC WITH DIFFERENTIAL/PLATELET
BASOS PCT: 0.5 % (ref 0.0–3.0)
Basophils Absolute: 0.1 10*3/uL (ref 0.0–0.1)
EOS ABS: 0 10*3/uL (ref 0.0–0.7)
EOS PCT: 0.1 % (ref 0.0–5.0)
HEMATOCRIT: 43.7 % (ref 36.0–46.0)
HEMOGLOBIN: 14.7 g/dL (ref 12.0–15.0)
LYMPHS PCT: 11.8 % — AB (ref 12.0–46.0)
Lymphs Abs: 1.2 10*3/uL (ref 0.7–4.0)
MCHC: 33.7 g/dL (ref 30.0–36.0)
MCV: 88.3 fl (ref 78.0–100.0)
MONO ABS: 0.4 10*3/uL (ref 0.1–1.0)
Monocytes Relative: 3.9 % (ref 3.0–12.0)
NEUTROS PCT: 83.7 % — AB (ref 43.0–77.0)
Neutro Abs: 8.3 10*3/uL — ABNORMAL HIGH (ref 1.4–7.7)
PLATELETS: 229 10*3/uL (ref 150.0–400.0)
RBC: 4.95 Mil/uL (ref 3.87–5.11)
RDW: 15 % (ref 11.5–15.5)
WBC: 9.9 10*3/uL (ref 4.0–10.5)

## 2017-05-12 LAB — BASIC METABOLIC PANEL
BUN: 15 mg/dL (ref 6–23)
CALCIUM: 9.5 mg/dL (ref 8.4–10.5)
CHLORIDE: 105 meq/L (ref 96–112)
CO2: 28 mEq/L (ref 19–32)
Creatinine, Ser: 0.86 mg/dL (ref 0.40–1.20)
GFR: 83.56 mL/min (ref >60.00)
Glucose, Bld: 215 mg/dL — ABNORMAL HIGH (ref 70–99)
Potassium: 3.1 mEq/L — ABNORMAL LOW (ref 3.5–5.1)
Sodium: 144 mEq/L (ref 135–145)

## 2017-05-12 MED ORDER — IOPAMIDOL (ISOVUE-300) INJECTION 61%
100.0000 mL | Freq: Once | INTRAVENOUS | Status: AC | PRN
  Administered 2017-05-12: 100 mL via INTRAVENOUS

## 2017-05-12 NOTE — Telephone Encounter (Signed)
Patient advised. She will come now for labs.  CT today. Patient will come get her instructions and contrast.

## 2017-05-13 ENCOUNTER — Telehealth: Payer: Self-pay | Admitting: Neurology

## 2017-05-13 ENCOUNTER — Telehealth: Payer: Self-pay | Admitting: Gastroenterology

## 2017-05-13 DIAGNOSIS — G4700 Insomnia, unspecified: Secondary | ICD-10-CM | POA: Diagnosis not present

## 2017-05-13 DIAGNOSIS — E119 Type 2 diabetes mellitus without complications: Secondary | ICD-10-CM | POA: Diagnosis not present

## 2017-05-13 DIAGNOSIS — F419 Anxiety disorder, unspecified: Secondary | ICD-10-CM | POA: Diagnosis not present

## 2017-05-13 NOTE — Telephone Encounter (Signed)
Her results are in EPIC.

## 2017-05-13 NOTE — Telephone Encounter (Signed)
Left message for pt to call back, see result note.

## 2017-05-13 NOTE — Telephone Encounter (Signed)
Please see results note. Thanks.

## 2017-05-13 NOTE — Telephone Encounter (Signed)
error 

## 2017-05-16 NOTE — Telephone Encounter (Signed)
Pt states she is returning phone call to nurse regarding results.

## 2017-05-18 ENCOUNTER — Ambulatory Visit: Payer: Medicare Other | Admitting: Physician Assistant

## 2017-05-24 DIAGNOSIS — E119 Type 2 diabetes mellitus without complications: Secondary | ICD-10-CM | POA: Diagnosis not present

## 2017-05-25 DIAGNOSIS — E119 Type 2 diabetes mellitus without complications: Secondary | ICD-10-CM | POA: Diagnosis not present

## 2017-05-25 DIAGNOSIS — H16223 Keratoconjunctivitis sicca, not specified as Sjogren's, bilateral: Secondary | ICD-10-CM | POA: Diagnosis not present

## 2017-05-25 DIAGNOSIS — L237 Allergic contact dermatitis due to plants, except food: Secondary | ICD-10-CM | POA: Diagnosis not present

## 2017-05-31 DIAGNOSIS — E785 Hyperlipidemia, unspecified: Secondary | ICD-10-CM | POA: Diagnosis not present

## 2017-05-31 DIAGNOSIS — G4733 Obstructive sleep apnea (adult) (pediatric): Secondary | ICD-10-CM | POA: Diagnosis not present

## 2017-05-31 DIAGNOSIS — E119 Type 2 diabetes mellitus without complications: Secondary | ICD-10-CM | POA: Diagnosis not present

## 2017-05-31 DIAGNOSIS — I10 Essential (primary) hypertension: Secondary | ICD-10-CM | POA: Diagnosis not present

## 2017-06-13 DIAGNOSIS — L03319 Cellulitis of trunk, unspecified: Secondary | ICD-10-CM | POA: Diagnosis not present

## 2017-06-13 DIAGNOSIS — E119 Type 2 diabetes mellitus without complications: Secondary | ICD-10-CM | POA: Diagnosis not present

## 2017-06-13 DIAGNOSIS — F419 Anxiety disorder, unspecified: Secondary | ICD-10-CM | POA: Diagnosis not present

## 2017-06-13 DIAGNOSIS — Z7984 Long term (current) use of oral hypoglycemic drugs: Secondary | ICD-10-CM | POA: Diagnosis not present

## 2017-06-15 ENCOUNTER — Ambulatory Visit: Payer: Medicare Other | Admitting: Physician Assistant

## 2017-06-17 ENCOUNTER — Ambulatory Visit (INDEPENDENT_AMBULATORY_CARE_PROVIDER_SITE_OTHER): Payer: Medicare Other | Admitting: Gastroenterology

## 2017-06-17 ENCOUNTER — Encounter: Payer: Self-pay | Admitting: Gastroenterology

## 2017-06-17 ENCOUNTER — Other Ambulatory Visit (INDEPENDENT_AMBULATORY_CARE_PROVIDER_SITE_OTHER): Payer: Medicare Other

## 2017-06-17 VITALS — BP 130/70 | HR 84 | Ht 62.0 in | Wt 143.0 lb

## 2017-06-17 DIAGNOSIS — R141 Gas pain: Secondary | ICD-10-CM

## 2017-06-17 DIAGNOSIS — K589 Irritable bowel syndrome without diarrhea: Secondary | ICD-10-CM

## 2017-06-17 DIAGNOSIS — R140 Abdominal distension (gaseous): Secondary | ICD-10-CM

## 2017-06-17 LAB — IGA: IgA: 177 mg/dL (ref 68–378)

## 2017-06-17 NOTE — Progress Notes (Signed)
Reviewed and agree with documentation and assessment and plan. K. Veena Nandigam , MD   

## 2017-06-17 NOTE — Progress Notes (Signed)
06/17/2017 Kristen Mahoney [id number] Sep 18, 1946   HISTORY OF PRESENT ILLNESS:  This is 71 year old female who is known to Dr. Silverio Decamp.  She has history of chronic GERD and irritable bowel syndrome, intermittent fecal incontinence status post multiple rectal surgeries with incompetent rectal sphincter.  She is here today with complaints of extreme flatulence. It was recommended previously by Dr. Silverio Decamp that she try FDgard for gas and dyspepsia, but she did not do so.  She denies any abdominal pain. She does actually have an 18 pound weight loss since she was last seen here in March. She had recent EGD and CT scan. Colonoscopy is up-to-date.  Her PCP has  recently placed her on Zoloft and actually just increased her dose to 100 mg daily for treatment of depression as possible cause of her weight loss. She does admit that she is not eating as much recently as she just does not have a taste for food.   Past Medical History:  Diagnosis Date  . Basal cell carcinoma   . Benign breast cyst in female    PATIENT HAS HISTORY OF BREAST CYSTS  . Diabetes mellitus   . Herpes progenitalis   . Hypertension   . Kidney stone   . OSA on CPAP   . Urinary, incontinence, stress female   . Vertigo    Past Surgical History:  Procedure Laterality Date  . ABDOMINAL HYSTERECTOMY  1986   leiomyomata  . Basal cell excised    . breast cystectomy     right  . CHOLECYSTECTOMY  2003  . COLONOSCOPY    . ear cystectomy    . ESOPHAGOGASTRODUODENOSCOPY    . KNEE ARTHROSCOPY     right  . LITHOTRIPSY    . RECTOVAGINAL FISTULA CLOSURE      reports that she has quit smoking. She has never used smokeless tobacco. She reports that she drinks alcohol. She reports that she does not use drugs. family history includes Breast cancer in her paternal aunt; Colon cancer in her father; Colon polyps in her brother, brother, and sister; Diabetes in her brother, father, mother, sister, and sister; Heart disease in her  brother, father, and sister; Hyperlipidemia in her brother; Hypertension in her father, sister, and sister; Stroke in her brother. Allergies  Allergen Reactions  . Ciprofloxacin Rash  . Morphine And Related Itching      Outpatient Encounter Prescriptions as of 06/17/2017  Medication Sig  . acyclovir (ZOVIRAX) 200 MG capsule Take 5 times a day as needed (Patient taking differently: Take 200 mg by mouth daily. )  . aspirin EC 81 MG tablet Take 81 mg by mouth every evening.   [name] Kitchen atorvastatin (LIPITOR) 10 MG tablet Take 10 mg by mouth daily.  . betamethasone dipropionate (DIPROLENE) 0.05 % cream Apply topically 2 (two) times daily.  [name] Kitchen bismuth subsalicylate (PEPTO BISMOL) 262 MG/15ML suspension Take 30 mLs by mouth every 6 (six) hours as needed for indigestion.  . docusate sodium (COLACE) 100 MG capsule Take 1 capsule (100 mg total) by mouth every 12 (twelve) hours.  [name] Kitchen doxycycline (VIBRAMYCIN) 100 MG capsule Take 1 capsule by mouth 2 (two) times daily.  . hydrocortisone 2.5 % cream Apply topically 2 (two) times daily.  [name] Kitchen loratadine (CLARITIN) 10 MG tablet Take 10 mg by mouth daily.  . Melatonin CR (MELADOX) 3 MG TBCR Take 1 tablet by mouth daily.  . metFORMIN (GLUCOPHAGE) 500 MG tablet Take 1,000 mg by mouth 2 (two) times daily with a  meal.   . metoprolol succinate (TOPROL XL) 50 MG 24 hr tablet Take 1 tablet (50 mg total) by mouth daily. Take with or immediately following a meal.  . Multiple Vitamins-Minerals (ICAPS) CAPS Take by mouth.  . ondansetron (ZOFRAN) 4 MG tablet Take 1 tablet (4 mg total) by mouth every 6 (six) hours.  . pantoprazole (PROTONIX) 40 MG tablet Take 1 tablet (40 mg total) by mouth 2 (two) times daily before a meal.  . Polyethyl Glycol-Propyl Glycol (SYSTANE OP) Place 1 drop into both eyes daily as needed (dry eyes).   . prednisoLONE acetate (PRED FORTE) 1 % ophthalmic suspension Place 1 drop into the left eye 2 (two) times daily.   . ranitidine (ZANTAC) 150 MG tablet Take  1 tablet (150 mg total) by mouth at bedtime. (Patient taking differently: Take 150 mg by mouth daily as needed for heartburn. )  . sertraline (ZOLOFT) 50 MG tablet Take 1 tablet by mouth daily.  . Vitamin D, Cholecalciferol, 400 units TABS Take by mouth.  . vitamin E (VITAMIN E) 400 UNIT capsule Take 400 Units by mouth daily.  . [DISCONTINUED] citalopram (CELEXA) 20 MG tablet Take 20 mg by mouth daily.    . [DISCONTINUED] metroNIDAZOLE (FLAGYL) 500 MG tablet Take 1 tablet (500 mg total) by mouth 2 (two) times daily.   Facility-Administered Encounter Medications as of 06/17/2017  Medication  . 0.9 %  sodium chloride infusion  . betamethasone acetate-betamethasone sodium phosphate (CELESTONE) injection 3 mg     REVIEW OF SYSTEMS  : All other systems reviewed and negative except where noted in the History of Present Illness.   PHYSICAL EXAM: BP 130/70   Pulse 84   Ht 5\' 2"  (1.575 m)   Wt 143 lb (64.9 kg)   BMI 26.16 kg/m  General: Well developed black female in no acute distress Head: Normocephalic and atraumatic Eyes:  Sclerae anicteric, conjunctiva pink. Ears: Normal auditory acuity Lungs: Clear throughout to auscultation; no increased WOB. Heart: Regular rate and rhythm Abdomen: Soft, non-distended. Normal bowel sounds.  Non-tender. Musculoskeletal: Symmetrical with no gross deformities  Skin: No lesions on visible extremities Extremities: No edema  Neurological: Alert oriented x 4, grossly non-focal Psychological:  Alert and cooperative. 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 Silverio Decamp previously recommended FDgard, but patient did not try it.  She may do better with IBgard due to complaints of lower gas.  Really could try either of them.  Will check celiac labs as well just to be sure that is  not an issue as I am not sure that it has ever been done.  **25 minutes spent with patient over 50% in counseling and discussion of options  CC:  Rankins, Bill Salinas, MD

## 2017-06-17 NOTE — Patient Instructions (Signed)
Your physician has requested that you go to the basement for lab work before leaving today.  Please purchase the following medications over the counter and take as directed:   IB gard as directed (we have given you some samples)

## 2017-06-20 LAB — TISSUE TRANSGLUTAMINASE, IGA: Tissue Transglutaminase Ab, IgA: 1 U/mL (ref <4)

## 2017-07-12 ENCOUNTER — Encounter: Payer: Self-pay | Admitting: Neurology

## 2017-07-13 ENCOUNTER — Other Ambulatory Visit: Payer: Self-pay | Admitting: Gastroenterology

## 2017-07-14 ENCOUNTER — Encounter: Payer: Self-pay | Admitting: Neurology

## 2017-07-14 ENCOUNTER — Ambulatory Visit (INDEPENDENT_AMBULATORY_CARE_PROVIDER_SITE_OTHER): Payer: Medicare Other | Admitting: Neurology

## 2017-07-14 VITALS — BP 170/82 | HR 84 | Ht 61.0 in | Wt 140.5 lb

## 2017-07-14 DIAGNOSIS — F329 Major depressive disorder, single episode, unspecified: Secondary | ICD-10-CM

## 2017-07-14 DIAGNOSIS — R634 Abnormal weight loss: Secondary | ICD-10-CM | POA: Insufficient documentation

## 2017-07-14 DIAGNOSIS — R159 Full incontinence of feces: Secondary | ICD-10-CM | POA: Diagnosis not present

## 2017-07-14 DIAGNOSIS — I10 Essential (primary) hypertension: Secondary | ICD-10-CM | POA: Diagnosis not present

## 2017-07-14 DIAGNOSIS — Z9989 Dependence on other enabling machines and devices: Secondary | ICD-10-CM | POA: Diagnosis not present

## 2017-07-14 DIAGNOSIS — F32A Depression, unspecified: Secondary | ICD-10-CM | POA: Insufficient documentation

## 2017-07-14 DIAGNOSIS — F419 Anxiety disorder, unspecified: Secondary | ICD-10-CM | POA: Insufficient documentation

## 2017-07-14 DIAGNOSIS — G4733 Obstructive sleep apnea (adult) (pediatric): Secondary | ICD-10-CM

## 2017-07-14 DIAGNOSIS — Z7984 Long term (current) use of oral hypoglycemic drugs: Secondary | ICD-10-CM | POA: Diagnosis not present

## 2017-07-14 DIAGNOSIS — E119 Type 2 diabetes mellitus without complications: Secondary | ICD-10-CM | POA: Diagnosis not present

## 2017-07-14 NOTE — Progress Notes (Signed)
[facility]   Provider:  Larey Seat, M D  Referring Provider: [name], MD Primary Care Physician:  [name], MD  Chief Complaint  Patient presents with  . Follow-up    pt has had dry mouth and been taking machine off during night    HPI: I have pleasure of seeing Kristen Mahoney today who has been a CPAP user since 2008 when she was originally diagnosed with obstructive sleep apnea at an AHI of 29.8. She achieved a lot of weight loss and was for this reason placed on an auto titrating. Last year the 95%t percentile pressure was 12.5 cm water and this year it is 10.8 cm water. This correlates with further weight loss, 23 pounds.  A residual AHI is 3.2 but the majority is central in nature, she continues on her AutoSet was 1 cm EPR she has used to machine 28 out of 30 days but only 18 days for longer than 4 hours. Her sleep has been more fragmented since she is depressed and she wakes up in the middle of the night having removed the mask. She has a dry mouth . This was not all intentional weight loss, some of this may be attributed to depression. She felt severely depressed she had finally weaned off alprazolam, and she was started on an SSRI, namely Zoloft. She still has a feeling of in a unrest and jitteriness, anxiety and she is excessively daytime sleepy. All this fits depression. Her symptoms have gotten better since she started Zoloft but they have not been yet controlled. I think it may help if she also exercises daily. She has rectal-stool incontinence which makes it hard for her to swim. Epworth 6 points, FSS 20 points.      Kristen Mahoney is a 71 y.o. female  Is seen here as a  revisit  from Dr. Radene Ou for sleep medicine evaluation, CPAP compliance I have not encountered Kristen Mahoney and a little over 3 years. The patient is here as a new patient as-established patient revisit. She has seen me in the past for her sleep disorder which is obstructive sleep  apnea. Her primary care physician is now [facility]. She needs new CPAP supplies and ongoing follow-up and for this reason brought his CPAP machine here today. As I have seen Kristen Mahoney last she has become a grandmother her grandson just celebrated his second birthday. She is a mother of 2 adult children. She is retired. He is also followed by orthopedist Dr. Elesa Massed and by cardiologist Dr. Terrence Dupont . She has diabetes which has been well controlled as long as she was able to exercise and walk. Due to knee pain she has been less able to exercise. She has no medication side effects that she reports at this time she feels extraordinarily well but she needs new supplies.  Kristen Mahoney's sleep study from 09-05-2007 revealed an AHI of 29.8, supine AHI of 47.4 and nonsupine of 11.1 REM AHI of 32. Periodic leg movements for a few was an arousal index of 3.6 she was snoring. She was titrated to CPAP but then placed on an auto titrator as she achieved weight loss.  I was able to review the patient's last laboratory results her hemoglobin A1c was elevated after receiving cortisone shots into the knee at 7.7. This has been higher than usual for her. No cause was 160s 3. Cholesterol is controlled. Also she has a discrepancy between LDL and HDL. Rankins once to  make sure that the patient is followed for her CPAP and compliance.  Her sleep habits are described as follows, she usually goes to bed late and watches TV usually a movie at about 2 AM before finally going to sleep. Fall asleep in front of the TV. The TV in the bedroom. She stated that her husband rarely disturbs her sleep and he may occasionally snore. But she is moving restlessly. She may have one bathroom break at night but usually nocturia does not fragment his sleep. And she rises in the morning at 10 AM. Today's an unusual early day for her now in retirement. She may drink some caffeine in the morning. She has breakfast at home. She is trying to exercise by  walking.  Interval history from 07/13/2016. Kristen Mahoney's apnea has now been well controlled by using an AutoSet between 5 and 15 cm water was 1 cm EPR, the 95th percentile pressure is 12.5 cm her apnea index is 5.0 there still 3.5 obstructive apneas per hour but these are negligible. She is using her machine 7 hours 1 minute each day on average and has a 97% compliance for over 4 hours of use. Based on these data I will see the patient again in one year. Her Epworth sleepiness score today was endorsed at 7 and her fatigue severity at 15 points. There is no evidence of clinical depression.  Review of Systems: Out of a complete 14 system review, the patient complains of only the following symptoms, and all other reviewed systems are negative. Big toe pain,  Running up to the knee- DM neuropathy? Gout.  The patient has transiently suffered from higher blood pressures on higher blood glucose levels due to the steroid treatment of her knee pain. She further has reported snoring at times. Vision joint pain aching muscles and diarrhea and dizziness.  The patient still socially drinks some alcohol she drinks her coffee or caffeinated sodas occasionally but certainly not daily she is a retired Sales executive. She lives with her husband. She just travelled to [place].  nausea, jitteriness, bad taste in the mouth, drop in CPAP comlpiance.  Unintentional weight loss. Thyroid normal. DEPRESSION.   Social History   Social History  . Marital status: Married    Spouse name: N/A  . Number of children: 2  . Years of education: N/A   Occupational History  . Retired Retired   Social History Main Topics  . Smoking status: Former Research scientist (life sciences)  . Smokeless tobacco: Never Used  . Alcohol use Yes     Comment: occasional Rare  . Drug use: No  . Sexual activity: Yes    Birth control/ protection: Surgical   Other Topics Concern  . Not on file   Social History Narrative   Denies caffeine use.    Family History    Problem Relation Age of Onset  . Diabetes Mother   . Diabetes Father   . Colon cancer Father   . Heart disease Father   . Hypertension Father   . Diabetes Sister   . Hypertension Sister   . Diabetes Brother   . Colon polyps Brother   . Breast cancer Paternal Aunt        Age 10's  . Colon polyps Brother   . Heart disease Brother   . Stroke Brother   . Hyperlipidemia Brother   . Colon polyps Sister   . Diabetes Sister   . Hypertension Sister   . Heart disease Sister   . Esophageal cancer  Neg Hx   . Rectal cancer Neg Hx   . Stomach cancer Neg Hx     Past Medical History:  Diagnosis Date  . Basal cell carcinoma   . Benign breast cyst in female    PATIENT HAS HISTORY OF BREAST CYSTS  . Diabetes mellitus   . Herpes progenitalis   . Hypertension   . Kidney stone   . OSA on CPAP   . Urinary, incontinence, stress female   . Vertigo     Past Surgical History:  Procedure Laterality Date  . ABDOMINAL HYSTERECTOMY  1986   leiomyomata  . Basal cell excised    . breast cystectomy     right  . CHOLECYSTECTOMY  2003  . COLONOSCOPY    . ear cystectomy    . ESOPHAGOGASTRODUODENOSCOPY    . KNEE ARTHROSCOPY     right  . LITHOTRIPSY    . RECTOVAGINAL FISTULA CLOSURE      Current Outpatient Prescriptions  Medication Sig Dispense Refill  . aspirin EC 81 MG tablet Take 81 mg by mouth every evening.     [name] Kitchen atorvastatin (LIPITOR) 10 MG tablet Take 10 mg by mouth daily.    [name] Kitchen bismuth subsalicylate (PEPTO BISMOL) 262 MG/15ML suspension Take 30 mLs by mouth every 6 (six) hours as needed for indigestion.    . hyoscyamine (LEVSIN SL) 0.125 MG SL tablet DISSOLVE ONE TABLET IN MOUTH EVERY 6 HOURS AS NEEDED FOR CRAMPING 90 tablet 0  . metFORMIN (GLUCOPHAGE) 500 MG tablet Take 1,000 mg by mouth 2 (two) times daily with a meal.     . metoprolol succinate (TOPROL XL) 50 MG 24 hr tablet Take 1 tablet (50 mg total) by mouth daily. Take with or immediately following a meal. 30 tablet 0  .  Multiple Vitamins-Minerals (ICAPS) CAPS Take by mouth.    . pantoprazole (PROTONIX) 40 MG tablet Take 1 tablet (40 mg total) by mouth 2 (two) times daily before a meal. 60 tablet 3  . ranitidine (ZANTAC) 150 MG tablet Take 1 tablet (150 mg total) by mouth at bedtime. (Patient taking differently: Take 150 mg by mouth daily as needed for heartburn. ) 30 tablet 3  . sertraline (ZOLOFT) 50 MG tablet Take 1 tablet by mouth daily.    . Vitamin D, Cholecalciferol, 400 units TABS Take by mouth.    . vitamin E (VITAMIN E) 400 UNIT capsule Take 400 Units by mouth daily.    [name] Kitchen loratadine (CLARITIN) 10 MG tablet Take 10 mg by mouth daily.     Current Facility-Administered Medications  Medication Dose Route Frequency Provider Last Rate Last Dose  . betamethasone acetate-betamethasone sodium phosphate (CELESTONE) injection 3 mg  3 mg Intramuscular Once Edrick Kins, DPM        Allergies as of 07/14/2017 - Review Complete 07/14/2017  Allergen Reaction Noted  . Ciprofloxacin Rash 09/04/2009  . Morphine and related Itching 05/07/2011    Vitals: BP (!) 170/82 (BP Location: Right Arm, Patient Position: Sitting, Cuff Size: Normal) Comment: manual  Pulse 84   Ht 5\' 1"  (1.549 m)   Wt 140 lb 8 oz (63.7 kg)   BMI 26.55 kg/m  Last Weight:  Wt Readings from Last 1 Encounters:  07/14/17 140 lb 8 oz (63.7 kg)       Last Height:   Ht Readings from Last 1 Encounters:  07/14/17 5\' 1"  (1.549 m)    Physical exam:  General: The patient is awake, alert and appears not in acute  distress. The patient is well groomed. Head: Normocephalic, atraumatic. Neck is supple. Mallampati 3   neck circumference: 14 inches. Nasal airflow ; allergies affected. TMJ is  Not  evident . Retrognathia is seen.  Cardiovascular:  Regular rate and rhythm without  murmurs or carotid bruit, and without distended neck veins. Respiratory: Lungs are clear to auscultation. Skin:  Without evidence of edema, or rash. Trunk: BMI is elevated  and patient  has normal posture. Speech is fluent without dysarthria, dysphonia or aphasia. Mood and affect are depressed. Cranial nerves: metallic taste  Since being on metformin.  Pupils are equal and briskly reactive to light. Funduscopic exam without evidence of pallor or edema. Extraocular movements  in vertical and horizontal planes intact and without nystagmus. Visual fields by finger perimetry are intact.Hearing to finger rub intact.   Facial sensation intact to fine touch. Facial motor strength is symmetric and tongue and uvula move midline.  Motor exam:  Normal tone ,muscle bulk and symmetric strength in all extremities. Assessment:  After physical and neurologic examination, review of laboratory studies, imaging, neurophysiology testing and pre-existing records, assessment is   Possible spinal stenosis , hyperreflexia in both knees , in spite of diabetes. Weakness when rising , needs to brace herself.  The patient was advised of the nature of the diagnosed sleep disorder , the treatment options and risks for general a health and wellness arising from not treating the condition. Visit duration was 30 minutes.   Plan:  Treatment plan and additional workup :  I will prescribe replacement mask , tubing, and  filter etc. Her pressure window was elevated to 15 cm water. Her AHI is 3.2  The patient did describe depressive symptoms, clearly but also no jitteriness a level of baseline nausea that makes her not appreciate the final things in life area sometimes and the metallic taste in the mouth I wonder if this is all related to metformin. She does feel that Zoloft helped a little bit with the depression symptoms but I think that her nausea and in a unrest may be related to metformin. And this may have also led to some of the weight loss- it is not all depression, she feels.  Her primary care is at [facility] and I encouraged her to discuss alternatives to metformin. She may just need 14 days to see  the difference and if metformin is truly to cause of her current problems.  I did not suggest to reintroduce benzodiazepines. She could possibly benefit from the addition of Wellbutrin only in the morning. This should give her some energy. However I would only introduces if metformin is not the cause, as a Plan B.  Rv in 12 month with CPAP, NP.      Kristen Partridge Dwayne Bulkley MD  07/14/2017

## 2017-07-22 DIAGNOSIS — M5489 Other dorsalgia: Secondary | ICD-10-CM | POA: Diagnosis not present

## 2017-08-04 DIAGNOSIS — R110 Nausea: Secondary | ICD-10-CM | POA: Diagnosis not present

## 2017-08-04 DIAGNOSIS — R109 Unspecified abdominal pain: Secondary | ICD-10-CM | POA: Diagnosis not present

## 2017-08-04 DIAGNOSIS — R0982 Postnasal drip: Secondary | ICD-10-CM | POA: Diagnosis not present

## 2017-08-04 DIAGNOSIS — R634 Abnormal weight loss: Secondary | ICD-10-CM | POA: Diagnosis not present

## 2017-08-12 ENCOUNTER — Telehealth: Payer: Self-pay | Admitting: Gastroenterology

## 2017-08-12 NOTE — Telephone Encounter (Signed)
Patient complains of "long uninterrupted gas" that occurs on a daily basis. Clarified with she is referring to intestinal gas. She also has pain from her neck down her spine. She has constipation alternating with diarrhea. States she does not have incontinence.  Attempted to review medications. She says she does not use FDgard or IBgard. She does not recognize Pantoprazole or Ranitidine. States she has every morning nausea. States she is on a new anti-depressant. Plan is she will keep her appointment she has scheduled with her PCP. The appointment is next week. Appointment made here for 08/23/17 if her [facility] PCP feels her symptoms are GI related.

## 2017-08-18 DIAGNOSIS — F419 Anxiety disorder, unspecified: Secondary | ICD-10-CM | POA: Diagnosis not present

## 2017-08-18 DIAGNOSIS — E870 Hyperosmolality and hypernatremia: Secondary | ICD-10-CM | POA: Diagnosis not present

## 2017-08-18 DIAGNOSIS — R899 Unspecified abnormal finding in specimens from other organs, systems and tissues: Secondary | ICD-10-CM | POA: Diagnosis not present

## 2017-08-23 ENCOUNTER — Ambulatory Visit (INDEPENDENT_AMBULATORY_CARE_PROVIDER_SITE_OTHER): Payer: Medicare Other | Admitting: Gastroenterology

## 2017-08-23 ENCOUNTER — Encounter (INDEPENDENT_AMBULATORY_CARE_PROVIDER_SITE_OTHER): Payer: Self-pay

## 2017-08-23 ENCOUNTER — Encounter: Payer: Self-pay | Admitting: Gastroenterology

## 2017-08-23 VITALS — BP 136/72 | HR 84 | Ht 62.0 in | Wt 134.6 lb

## 2017-08-23 DIAGNOSIS — R634 Abnormal weight loss: Secondary | ICD-10-CM | POA: Diagnosis not present

## 2017-08-23 DIAGNOSIS — R140 Abdominal distension (gaseous): Secondary | ICD-10-CM | POA: Diagnosis not present

## 2017-08-23 DIAGNOSIS — K6289 Other specified diseases of anus and rectum: Secondary | ICD-10-CM | POA: Insufficient documentation

## 2017-08-23 DIAGNOSIS — K625 Hemorrhage of anus and rectum: Secondary | ICD-10-CM | POA: Diagnosis not present

## 2017-08-23 MED ORDER — NA SULFATE-K SULFATE-MG SULF 17.5-3.13-1.6 GM/177ML PO SOLN: ORAL | 0 refills | Status: DC

## 2017-08-23 NOTE — Progress Notes (Signed)
08/23/2017 Kristen Mahoney [id number] 1946/10/01   HISTORY OF PRESENT ILLNESS:  This is a 71 year old female who is known to Dr. Silverio Decamp. She has history of chronic GERD irritable bowel syndrome, intermittent fecal incontinence as well as multiple rectal surgeries with incompetent rectal sphincter and rectal prolapse. She was seen a couple of months ago for complaints of extreme flatulence. She complains of the same issue today. Tried IBgard but not consistently.  She also remains concerned about her weight loss. She has lost 18 pounds from March to June and is now down 9 more pounds since June for a total of 27 pounds since March of this year. She's had recent EGD and CT scan. Colonoscopy is up-to-date, but she is due in March 2019 for surveillance due to family history of colon cancer. She does admit to a lot of depression and anxiety, which is being treated by her PCP, but has yet to be controlled. It seems like she is going through medication changes quite quickly (? Due to reported side effects). She does admit to rectal discomfort and intermittent small amounts of bleeding.  Complains of pressure in her entire pelvis.   Past Medical History:  Diagnosis Date  . Basal cell carcinoma   . Benign breast cyst in female    PATIENT HAS HISTORY OF BREAST CYSTS  . Diabetes mellitus   . Herpes progenitalis   . Hypertension   . Kidney stone   . OSA on CPAP   . Urinary, incontinence, stress female   . Vertigo    Past Surgical History:  Procedure Laterality Date  . ABDOMINAL HYSTERECTOMY  1986   leiomyomata  . Basal cell excised    . breast cystectomy     right  . CHOLECYSTECTOMY  2003  . COLONOSCOPY    . ear cystectomy    . ESOPHAGOGASTRODUODENOSCOPY    . KNEE ARTHROSCOPY     right  . LITHOTRIPSY    . RECTOVAGINAL FISTULA CLOSURE      reports that she has quit smoking. She has never used smokeless tobacco. She reports that she drinks alcohol. She reports that she does not use  drugs. family history includes Breast cancer in her paternal aunt; Colon cancer in her father; Colon polyps in her brother, brother, and sister; Diabetes in her brother, father, mother, sister, and sister; Heart disease in her brother, father, and sister; Hyperlipidemia in her brother; Hypertension in her father, sister, and sister; Stroke in her brother. Allergies  Allergen Reactions  . Ciprofloxacin Rash  . Morphine And Related Itching      Outpatient Encounter Prescriptions as of 08/23/2017  Medication Sig  . aspirin EC 81 MG tablet Take 81 mg by mouth every evening.   [name] Kitchen atorvastatin (LIPITOR) 20 MG tablet Take 20 mg by mouth daily.   [name] Kitchen bismuth subsalicylate (PEPTO BISMOL) 262 MG/15ML suspension Take 30 mLs by mouth every 6 (six) hours as needed for indigestion.  [name] Kitchen buPROPion (ZYBAN) 150 MG 12 hr tablet Take 150 mg by mouth daily. Start after finishing fluoxetine  . FLUoxetine (PROZAC) 10 MG capsule Take 10 mg by mouth daily. For 10 days starting on 08-18-17  . hyoscyamine (LEVSIN SL) 0.125 MG SL tablet DISSOLVE ONE TABLET IN MOUTH EVERY 6 HOURS AS NEEDED FOR CRAMPING  . loratadine (CLARITIN) 10 MG tablet Take 10 mg by mouth daily as needed.   . metFORMIN (GLUCOPHAGE) 500 MG tablet Take 1,000 mg by mouth 2 (two) times daily with a meal.   .  metoprolol succinate (TOPROL XL) 50 MG 24 hr tablet Take 1 tablet (50 mg total) by mouth daily. Take with or immediately following a meal.  . Multiple Vitamins-Minerals (ICAPS) CAPS Take by mouth.  [name] Kitchen omeprazole (PRILOSEC) 20 MG capsule Take 20 mg by mouth 2 (two) times daily before a meal.  . ranitidine (ZANTAC) 150 MG tablet Take 1 tablet (150 mg total) by mouth at bedtime. (Patient taking differently: Take 150 mg by mouth daily as needed for heartburn. )  . sertraline (ZOLOFT) 50 MG tablet Take 1 tablet by mouth daily.  . Vitamin D, Cholecalciferol, 400 units TABS Take by mouth.  . vitamin E (VITAMIN E) 400 UNIT capsule Take 400 Units by mouth daily.   . [DISCONTINUED] pantoprazole (PROTONIX) 40 MG tablet Take 1 tablet (40 mg total) by mouth 2 (two) times daily before a meal.   Facility-Administered Encounter Medications as of 08/23/2017  Medication  . betamethasone acetate-betamethasone sodium phosphate (CELESTONE) injection 3 mg     REVIEW OF SYSTEMS  : All other systems reviewed and negative except where noted in the History of Present Illness.   PHYSICAL EXAM: Ht 5\' 2"  (1.575 m)   Wt 134 lb 9.6 oz (61.1 kg)   BMI 24.62 kg/m  General: Well developed black female in no acute distress Head: Normocephalic and atraumatic Eyes:  Sclerae anicteric, conjunctiva pink. Ears: Normal auditory acuity Lungs: Clear throughout to auscultation; no increased WOB. Heart: Regular rate and rhythm; no M/R/G. Abdomen: Soft, non-distended.  BS present.  Mild diffuse TTP. Rectal:  Rectal prolapse noted with a lot of mucus.  DRE did not reveal any masses.  Coccyx could be felt posteriorly.  Light brown stool on exam glove. Musculoskeletal: Symmetrical with no gross deformities  Skin: No lesions on visible extremities Extremities: No edema  Neurological: Alert oriented x 4, grossly non-focal Psychological:  Alert and cooperative. 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 -Weight loss:  This is concerning with 27 pound unintentional weight loss since March.  CT scan and EGD ok.  Colonoscopy up-to-date, but due in about 6 months.  We will repeat this for completion from GI standpoint.  Will schedule with Dr. Silverio Decamp.  I think that it may be from her depression/anxiety, which needs better controlled and I recommended possibly seeing a psychiatrist. -IBS with gas/bloating:  Will re-try some IBgard regularly for several days.  Samples and coupons given. -Rectal/pelvic pressure:  Probably from her  rectal prolapse.  *The risks, benefits, and alternatives to colonoscopy were discussed with the patient and she consents to proceed.    CC:  Rankins, Bill Salinas, MD

## 2017-08-23 NOTE — Patient Instructions (Addendum)
If you are age 72 or older, your body mass index should be between 23-30. Your Body mass index is 24.62 kg/m. If this is out of the aforementioned range listed, please consider follow up with your Primary Care Provider.  If you are age 41 or younger, your body mass index should be between 19-25. Your Body mass index is 24.62 kg/m. If this is out of the aformentioned range listed, please consider follow up with your Primary Care Provider.   You have been scheduled for a colonoscopy. Please follow written instructions given to you at your visit today.  Please pick up your prep supplies at the pharmacy within the next 1-3 days. If you use inhalers (even only as needed), please bring them with you on the day of your procedure. Your physician has requested that you go to www.startemmi.com and enter the access code given to you at your visit today. This web site gives a general overview about your procedure. However, you should still follow specific instructions given to you by our office regarding your preparation for the procedure.  Samples of IBgard given with coupons.  Take twice daily for one week; if helpful use as needed.  Recommend treatment of depression.  Thank you for choosing me and [facility].   Alonza Bogus, PA-C

## 2017-08-26 NOTE — Progress Notes (Signed)
Reviewed and agree with documentation and assessment and plan. K. Veena Sereen Schaff , MD   

## 2017-08-29 DIAGNOSIS — E119 Type 2 diabetes mellitus without complications: Secondary | ICD-10-CM | POA: Diagnosis not present

## 2017-08-30 DIAGNOSIS — R0789 Other chest pain: Secondary | ICD-10-CM | POA: Diagnosis not present

## 2017-08-30 DIAGNOSIS — J309 Allergic rhinitis, unspecified: Secondary | ICD-10-CM | POA: Diagnosis not present

## 2017-08-30 DIAGNOSIS — E119 Type 2 diabetes mellitus without complications: Secondary | ICD-10-CM | POA: Diagnosis not present

## 2017-08-30 DIAGNOSIS — I10 Essential (primary) hypertension: Secondary | ICD-10-CM | POA: Diagnosis not present

## 2017-08-31 ENCOUNTER — Encounter: Payer: Self-pay | Admitting: Women's Health

## 2017-08-31 ENCOUNTER — Ambulatory Visit (INDEPENDENT_AMBULATORY_CARE_PROVIDER_SITE_OTHER): Payer: Medicare Other | Admitting: Women's Health

## 2017-08-31 VITALS — BP 126/80 | Ht 62.0 in | Wt 134.0 lb

## 2017-08-31 DIAGNOSIS — R102 Pelvic and perineal pain: Secondary | ICD-10-CM

## 2017-08-31 DIAGNOSIS — A599 Trichomoniasis, unspecified: Secondary | ICD-10-CM

## 2017-08-31 DIAGNOSIS — K5900 Constipation, unspecified: Secondary | ICD-10-CM

## 2017-08-31 DIAGNOSIS — R350 Frequency of micturition: Secondary | ICD-10-CM

## 2017-08-31 DIAGNOSIS — N83291 Other ovarian cyst, right side: Secondary | ICD-10-CM | POA: Diagnosis not present

## 2017-08-31 DIAGNOSIS — F5089 Other specified eating disorder: Secondary | ICD-10-CM

## 2017-08-31 DIAGNOSIS — N898 Other specified noninflammatory disorders of vagina: Secondary | ICD-10-CM

## 2017-08-31 LAB — WET PREP FOR TRICH, YEAST, CLUE

## 2017-08-31 MED ORDER — METRONIDAZOLE 500 MG PO TABS: ORAL_TABLET | ORAL | 1 refills | Status: DC

## 2017-08-31 NOTE — Progress Notes (Signed)
Presents with lower abdominal pain x 2 days.Describes pain as constant and aching. Does not wake her up at night. Reports alternating episodes of constipation and diarrhea. Last BM this morning. Reports continued feeling of pressure in rectum- "constant feeling of needing to go." Reports change in appetite and >20lb weight loss in the last 6 months. Believes that anxiety medication (wellbutrin) has diminished her appetite in the morning. Does not eat breakfast but then "pigs out" at lunch and dinner. Informed us that PCP recommended having colonoscopy done in the next few months. Also informed us that recent Xray showed ovarian cysts. Has also noticed increased frequency with urination. Denies pain or decreased stream. Denies fever but has noticed increased sweating/hot flashes since stopping HRT>2 years ago. Hx positive for past trich infections with negative STD screen.  Exam:  appears well but anxious. Abdomen nontender. External genitalia mild erythema. Speculum exam. Minimal yellow discharge noted. Wet mount positive for trich and clue cells,. Bimanual exam pressure sensation and discomfort on the left adnexa no fullness noted, no discomfort on right..  No pain noted  with rectal exam- increased pressure on L side.  UA: Trace leukocytes, 6-10 WBCs, few bacteria  Trichomonas Urinary frequency  Plan: 2g Flagyl with additional refill. Take with food; two pills tonight and two pills in the morning, alcohol precautions reviewed. Has had nausea with vomiting in the past with the 2 g dose. Spouse needs to be treated as well. Encouraged patient to refrain from sexual encounter with spouse without condoms and ask spouse to alert other partners of infection. Tearful, aware of husband is unfaithful behavior. Urine culture pending.

## 2017-08-31 NOTE — Patient Instructions (Signed)
Cervicitis  Cervicitis is irritation and swelling of the cervix. The cervix is the lower and narrow end of the uterus. It is the part of the uterus that opens up to the vagina.  What are the causes?  This condition may be caused by:  · An STI (sexually transmitted infection), such as gonorrhea, chlamydia, or genital herpes.  · Objects that are put in the vagina, such as tampons or birth control devices. This usually occurs if an object is left in for too long.  · Chemical irritation or allergic reaction. This may be from vaginal douches, latex condoms, or contraceptive creams.  · An injury to the cervix.  · A bacterial infection.  · Radiation therapy.    What increases the risk?  You are more likely to develop this condition if:  · You have unprotected sex.  · You have sex with many partners.  · You have a new sexual partner.  · You start having sex at an early age.  · You have a history of STIs.    What are the signs or symptoms?  Symptoms of this condition include:  · Gray, white, yellow, or bad-smelling vaginal discharge.  · Pain or itchiness around the vagina.  · Pain during sex.  · Pain in the lower abdomen or lower back, especially during sex.  · Urinating often.  · Pain during urination.  · Abnormal vaginal bleeding, such as bleeding between periods, after sex, or after menopause.    In some cases, there are no symptoms.  How is this diagnosed?  This condition may be diagnosed with:  · A pelvic exam. Your health care provider will examine whether the cervix has an unusual discharge or bleeds easily when touched with a swab.  · A wet prep. This is a test in which vaginal discharge is examined under a microscope to check for signs of infection.  · A swab test of the cervix. For this test, sample cells from the cervix are collected on a swab and examined under a microscope to check for signs of infection.  · Urine tests.    How is this treated?  Treatment for cervicitis depends on what is causing the condition.  Treatment may include:  · Antibiotic medicines. These are used to treat certain infections, including STIs like gonorrhea or chlamydia. If you are taking these medicines to treat an STI, your sexual partner may also need to take these medicines.  · Antiviral medicines. These are used to treat herpes simplex virus. Your sexual partner may also need to take these medicines.  · Stopping use of items that cause irritation, such as tampons, latex condoms, douches, or spermicides.    Follow these instructions at home:  · Do not have sex until your health care provider says it is okay.  · Take over-the-counter and prescription medicines only as told by your health care provider.  · If you were prescribed an antibiotic, take it as told by your health care provider. Do not stop taking the antibiotic even if you start to feel better.  · Keep all follow-up visits as told by your health care provider. This is important.  Contact a health care provider if:  · Your symptoms come back or get worse after treatment.  · You have a fever.  · You have fatigue.  · You have pain in your abdomen.  · You experience nausea, vomiting, or diarrhea.  · You have back pain.  Get help right away if:  ·   You have severe abdominal pain that cannot be helped with medicine.  · You cannot urinate.  Summary  · Cervicitis is irritation and swelling of the cervix.  · This condition may be caused by an STI (sexually transmitted infection), an allergic reaction or chemical irritation, radiation therapy, or objects that are put in the vagina, such as tampons or diaphragms.  · Symptoms of this condition can include unusual vaginal discharge, painful urination, irritation or pain around the vagina, bleeding between periods or after sex, and pain during sex.  · You are more likely to develop this condition if you have unprotected sex, have many sexual partners, or have a history of STIs.  · This condition may be treated with antibiotic or antiviral medicines or  by stopping use of items that cause irritation.  This information is not intended to replace advice given to you by your health care provider. Make sure you discuss any questions you have with your health care provider.  Document Released: 12/13/2005 Document Revised: 08/28/2016 Document Reviewed: 08/28/2016  Elsevier Interactive Patient Education © 2017 [employer]

## 2017-09-02 LAB — URINE CULTURE
MICRO NUMBER:: 80978739
SPECIMEN QUALITY:: ADEQUATE

## 2017-09-02 LAB — URINALYSIS W MICROSCOPIC + REFLEX CULTURE
BILIRUBIN URINE: NEGATIVE
Glucose, UA: NEGATIVE
HGB URINE DIPSTICK: NEGATIVE
NITRITES URINE, INITIAL: NEGATIVE
Specific Gravity, Urine: 1.022 (ref 1.001–1.03)
pH: 6 (ref 5.0–8.0)

## 2017-09-02 LAB — CULTURE INDICATED

## 2017-09-05 ENCOUNTER — Ambulatory Visit (INDEPENDENT_AMBULATORY_CARE_PROVIDER_SITE_OTHER): Payer: Medicare Other

## 2017-09-05 ENCOUNTER — Ambulatory Visit (INDEPENDENT_AMBULATORY_CARE_PROVIDER_SITE_OTHER): Payer: Medicare Other | Admitting: Women's Health

## 2017-09-05 VITALS — BP 115/75

## 2017-09-05 DIAGNOSIS — N83201 Unspecified ovarian cyst, right side: Secondary | ICD-10-CM

## 2017-09-05 DIAGNOSIS — R1032 Left lower quadrant pain: Secondary | ICD-10-CM | POA: Diagnosis not present

## 2017-09-05 DIAGNOSIS — R102 Pelvic and perineal pain: Secondary | ICD-10-CM | POA: Diagnosis not present

## 2017-09-05 DIAGNOSIS — R1030 Lower abdominal pain, unspecified: Secondary | ICD-10-CM

## 2017-09-05 NOTE — Progress Notes (Signed)
Presents for ultrasound from complaint of low abdominal discomfort mostly on the left side, minimal symptoms today. Has had some problems with intermittent constipation with diarrhea. Was treated for Trichomonas last week. Reports that previous pain abdominal pain and urinary symptoms have improved since last visit (08/31/2017).  Partner completed course of abx as well, but continues to deny infidelity. Last BM was this morning.  Continues to have some rectal pressure, believes it is r/t gas. Plans to have colonoscopy next month as recommended by PCP. History of numerous follicular type cysts on the right ovary. TVH for fibroids on no HRT.  Exam: Appears well.  [place] results: T/V status post hysterectomy. Vaginal cuff normal. Rt OV seen with numerous connecting echofree areas. Individual cystic areas connect with each other- tortuous pattern suggestive of hydrosalpinx. Negative CFD. Difficult to ID ROV tissue, Lt OV atrophic neg CDS.  R Ovarian cysts Abdominal/Rectal pressure; IBS/constipation   Plan: Repeat US in 2 months to check stability. Reviewed most likely benign, Previous US completed in hospital showed similar cystic abnormalities  instructed to call if symptoms increase or change. Started to take over-the-counter Colace twice daily as needed.

## 2017-09-05 NOTE — Patient Instructions (Signed)
Cervicitis  Cervicitis is irritation and swelling of the cervix. The cervix is the lower and narrow end of the uterus. It is the part of the uterus that opens up to the vagina.  What are the causes?  This condition may be caused by:  · An STI (sexually transmitted infection), such as gonorrhea, chlamydia, or genital herpes.  · Objects that are put in the vagina, such as tampons or birth control devices. This usually occurs if an object is left in for too long.  · Chemical irritation or allergic reaction. This may be from vaginal douches, latex condoms, or contraceptive creams.  · An injury to the cervix.  · A bacterial infection.  · Radiation therapy.    What increases the risk?  You are more likely to develop this condition if:  · You have unprotected sex.  · You have sex with many partners.  · You have a new sexual partner.  · You start having sex at an early age.  · You have a history of STIs.    What are the signs or symptoms?  Symptoms of this condition include:  · Gray, white, yellow, or bad-smelling vaginal discharge.  · Pain or itchiness around the vagina.  · Pain during sex.  · Pain in the lower abdomen or lower back, especially during sex.  · Urinating often.  · Pain during urination.  · Abnormal vaginal bleeding, such as bleeding between periods, after sex, or after menopause.    In some cases, there are no symptoms.  How is this diagnosed?  This condition may be diagnosed with:  · A pelvic exam. Your health care provider will examine whether the cervix has an unusual discharge or bleeds easily when touched with a swab.  · A wet prep. This is a test in which vaginal discharge is examined under a microscope to check for signs of infection.  · A swab test of the cervix. For this test, sample cells from the cervix are collected on a swab and examined under a microscope to check for signs of infection.  · Urine tests.    How is this treated?  Treatment for cervicitis depends on what is causing the condition.  Treatment may include:  · Antibiotic medicines. These are used to treat certain infections, including STIs like gonorrhea or chlamydia. If you are taking these medicines to treat an STI, your sexual partner may also need to take these medicines.  · Antiviral medicines. These are used to treat herpes simplex virus. Your sexual partner may also need to take these medicines.  · Stopping use of items that cause irritation, such as tampons, latex condoms, douches, or spermicides.    Follow these instructions at home:  · Do not have sex until your health care provider says it is okay.  · Take over-the-counter and prescription medicines only as told by your health care provider.  · If you were prescribed an antibiotic, take it as told by your health care provider. Do not stop taking the antibiotic even if you start to feel better.  · Keep all follow-up visits as told by your health care provider. This is important.  Contact a health care provider if:  · Your symptoms come back or get worse after treatment.  · You have a fever.  · You have fatigue.  · You have pain in your abdomen.  · You experience nausea, vomiting, or diarrhea.  · You have back pain.  Get help right away if:  ·   You have severe abdominal pain that cannot be helped with medicine.  · You cannot urinate.  Summary  · Cervicitis is irritation and swelling of the cervix.  · This condition may be caused by an STI (sexually transmitted infection), an allergic reaction or chemical irritation, radiation therapy, or objects that are put in the vagina, such as tampons or diaphragms.  · Symptoms of this condition can include unusual vaginal discharge, painful urination, irritation or pain around the vagina, bleeding between periods or after sex, and pain during sex.  · You are more likely to develop this condition if you have unprotected sex, have many sexual partners, or have a history of STIs.  · This condition may be treated with antibiotic or antiviral medicines or  by stopping use of items that cause irritation.  This information is not intended to replace advice given to you by your health care provider. Make sure you discuss any questions you have with your health care provider.  Document Released: 12/13/2005 Document Revised: 08/28/2016 Document Reviewed: 08/28/2016  Elsevier Interactive Patient Education © 2017 [employer]

## 2017-09-22 DIAGNOSIS — E119 Type 2 diabetes mellitus without complications: Secondary | ICD-10-CM | POA: Diagnosis not present

## 2017-09-22 DIAGNOSIS — F419 Anxiety disorder, unspecified: Secondary | ICD-10-CM | POA: Diagnosis not present

## 2017-09-22 DIAGNOSIS — E785 Hyperlipidemia, unspecified: Secondary | ICD-10-CM | POA: Diagnosis not present

## 2017-09-22 DIAGNOSIS — Z23 Encounter for immunization: Secondary | ICD-10-CM | POA: Diagnosis not present

## 2017-09-30 DIAGNOSIS — M5416 Radiculopathy, lumbar region: Secondary | ICD-10-CM | POA: Diagnosis not present

## 2017-10-06 DIAGNOSIS — M5416 Radiculopathy, lumbar region: Secondary | ICD-10-CM | POA: Diagnosis not present

## 2017-10-12 ENCOUNTER — Encounter: Payer: Self-pay | Admitting: Gastroenterology

## 2017-10-12 ENCOUNTER — Ambulatory Visit (AMBULATORY_SURGERY_CENTER): Payer: Medicare Other | Admitting: Gastroenterology

## 2017-10-12 VITALS — BP 140/75 | HR 76 | Temp 98.9°F | Resp 11 | Ht 62.0 in | Wt 134.0 lb

## 2017-10-12 DIAGNOSIS — R634 Abnormal weight loss: Secondary | ICD-10-CM

## 2017-10-12 DIAGNOSIS — K625 Hemorrhage of anus and rectum: Secondary | ICD-10-CM

## 2017-10-12 DIAGNOSIS — E119 Type 2 diabetes mellitus without complications: Secondary | ICD-10-CM | POA: Diagnosis not present

## 2017-10-12 DIAGNOSIS — I10 Essential (primary) hypertension: Secondary | ICD-10-CM | POA: Diagnosis not present

## 2017-10-12 MED ORDER — SODIUM CHLORIDE 0.9 % IV SOLN: 500.0000 mL | INTRAVENOUS | Status: DC

## 2017-10-12 NOTE — Op Note (Signed)
[facility] Patient Name: Kristen Mahoney Procedure Date: 10/12/2017 1:43 PM MRN: [id number] Endoscopist: Mauri Pole , MD Age: 71 Referring MD:  Date of Birth: 1946-11-11 Gender: Female Account #: [id number] Procedure:                Colonoscopy Indications:              Evaluation of unexplained GI bleeding Medicines:                Monitored Anesthesia Care Procedure:                Pre-Anesthesia Assessment:                           - Prior to the procedure, a History and Physical                            was performed, and patient medications and                            allergies were reviewed. The patient's tolerance of                            previous anesthesia was also reviewed. The risks                            and benefits of the procedure and the sedation                            options and risks were discussed with the patient.                            All questions were answered, and informed consent                            was obtained. Prior Anticoagulants: The patient has                            taken no previous anticoagulant or antiplatelet                            agents. ASA Grade Assessment: II - A patient with                            mild systemic disease. After reviewing the risks                            and benefits, the patient was deemed in                            satisfactory condition to undergo the procedure.                           After obtaining informed consent, the colonoscope  was passed under direct vision. Throughout the                            procedure, the patient's blood pressure, pulse, and                            oxygen saturations were monitored continuously. The                            Colonoscope was introduced through the anus and                            advanced to the the cecum, identified by                            appendiceal orifice and  ileocecal valve. The                            colonoscopy was performed without difficulty. The                            patient tolerated the procedure well. The quality                            of the bowel preparation was good. The ileocecal                            valve, appendiceal orifice, and rectum were                            photographed. Scope In: 1:52:08 PM Scope Out: 2:07:54 PM Scope Withdrawal Time: 0 hours 11 minutes 55 seconds  Total Procedure Duration: 0 hours 15 minutes 46 seconds  Findings:                 The digital rectal exam findings include decreased                            sphincter tone.                           A few small-mouthed diverticula were found in the                            sigmoid colon and descending colon.                           Small rectal vault with partial prolapse of rectal                            mucosa, retroflexion could not be perfomed in the                            rectum  The exam was otherwise without abnormality. Complications:            No immediate complications. Estimated Blood Loss:     Estimated blood loss: none. Impression:               - Decreased sphincter tone found on digital rectal                            exam.                           - Diverticulosis in the sigmoid colon and in the                            descending colon.                           - The examination was otherwise normal.                           - No specimens collected. Recommendation:           - Patient has a contact number available for                            emergencies. The signs and symptoms of potential                            delayed complications were discussed with the                            patient. Return to normal activities tomorrow.                            Written discharge instructions were provided to the                            patient.                            - Resume previous diet.                           - Continue present medications.                           - Repeat colonoscopy in 5 years for screening                            purposes.                           - Return to GI clinic PRN. Mauri Pole, MD 10/12/2017 2:15:27 PM This report has been signed electronically.

## 2017-10-12 NOTE — Patient Instructions (Signed)
YOU HAD AN ENDOSCOPIC PROCEDURE TODAY AT [facility]:   Refer to the procedure report that was given to you for any specific questions about what was found during the examination.  If the procedure report does not answer your questions, please call your gastroenterologist to clarify.  If you requested that your care partner not be given the details of your procedure findings, then the procedure report has been included in a sealed envelope for you to review at your convenience later.  YOU SHOULD EXPECT: Some feelings of bloating in the abdomen. Passage of more gas than usual.  Walking can help get rid of the air that was put into your GI tract during the procedure and reduce the bloating. If you had a lower endoscopy (such as a colonoscopy or flexible sigmoidoscopy) you may notice spotting of blood in your stool or on the toilet paper. If you underwent a bowel prep for your procedure, you may not have a normal bowel movement for a few days.  Please Note:  You might notice some irritation and congestion in your nose or some drainage.  This is from the oxygen used during your procedure.  There is no need for concern and it should clear up in a day or so.  SYMPTOMS TO REPORT IMMEDIATELY:   Following lower endoscopy (colonoscopy or flexible sigmoidoscopy):  Excessive amounts of blood in the stool  Significant tenderness or worsening of abdominal pains  Swelling of the abdomen that is new, acute  Fever of 100F or higher   For urgent or emergent issues, a gastroenterologist can be reached at any hour by calling [phone].   DIET:  We do recommend a small meal at first, but then you may proceed to your regular diet.  Drink plenty of fluids but you should avoid alcoholic beverages for 24 hours.  MEDICATIONS:  Continue present medications.  FOLLOW UP:  Return to GI clinic as needed.  ACTIVITY:  You should plan to take it easy for the rest of today and you should NOT DRIVE or  use heavy machinery until tomorrow (because of the sedation medicines used during the test).    FOLLOW UP: Our staff will call the number listed on your records the next business day following your procedure to check on you and address any questions or concerns that you may have regarding the information given to you following your procedure. If we do not reach you, we will leave a message.  However, if you are feeling well and you are not experiencing any problems, there is no need to return our call.  We will assume that you have returned to your regular daily activities without incident.  If any biopsies were taken you will be contacted by phone or by letter within the next 1-3 weeks.  Please call us at [phone] if you have not heard about the biopsies in 3 weeks.   Thank you for allowing [place] to provide for your healthcare needs today.  SIGNATURES/CONFIDENTIALITY: You and/or your care partner have signed paperwork which will be entered into your electronic medical record.  These signatures attest to the fact that that the information above on your After Visit Summary has been reviewed and is understood.  Full responsibility of the confidentiality of this discharge information lies with you and/or your care-partner.

## 2017-10-12 NOTE — Progress Notes (Signed)
Report given to PACU, vss 

## 2017-10-13 ENCOUNTER — Telehealth: Payer: Self-pay | Admitting: *Deleted

## 2017-10-13 NOTE — Telephone Encounter (Signed)
No answer for second post procedure follow up call. Left message for patient to call with questions or concerns. Sm

## 2017-10-13 NOTE — Telephone Encounter (Signed)
No answer for post procedure call back. SM

## 2017-10-14 DIAGNOSIS — E785 Hyperlipidemia, unspecified: Secondary | ICD-10-CM | POA: Diagnosis not present

## 2017-10-14 DIAGNOSIS — E119 Type 2 diabetes mellitus without complications: Secondary | ICD-10-CM | POA: Diagnosis not present

## 2017-10-14 DIAGNOSIS — F419 Anxiety disorder, unspecified: Secondary | ICD-10-CM | POA: Diagnosis not present

## 2017-10-14 DIAGNOSIS — Z0000 Encounter for general adult medical examination without abnormal findings: Secondary | ICD-10-CM | POA: Diagnosis not present

## 2017-10-25 DIAGNOSIS — E119 Type 2 diabetes mellitus without complications: Secondary | ICD-10-CM | POA: Diagnosis not present

## 2017-11-03 DIAGNOSIS — H25813 Combined forms of age-related cataract, bilateral: Secondary | ICD-10-CM | POA: Diagnosis not present

## 2017-11-03 DIAGNOSIS — H353130 Nonexudative age-related macular degeneration, bilateral, stage unspecified: Secondary | ICD-10-CM | POA: Diagnosis not present

## 2017-11-03 DIAGNOSIS — E119 Type 2 diabetes mellitus without complications: Secondary | ICD-10-CM | POA: Diagnosis not present

## 2017-11-08 DIAGNOSIS — M1712 Unilateral primary osteoarthritis, left knee: Secondary | ICD-10-CM | POA: Diagnosis not present

## 2017-11-08 DIAGNOSIS — M1711 Unilateral primary osteoarthritis, right knee: Secondary | ICD-10-CM | POA: Diagnosis not present

## 2017-11-22 DIAGNOSIS — G4733 Obstructive sleep apnea (adult) (pediatric): Secondary | ICD-10-CM | POA: Diagnosis not present

## 2017-11-29 DIAGNOSIS — M533 Sacrococcygeal disorders, not elsewhere classified: Secondary | ICD-10-CM | POA: Diagnosis not present

## 2017-12-01 DIAGNOSIS — I10 Essential (primary) hypertension: Secondary | ICD-10-CM | POA: Diagnosis not present

## 2017-12-01 DIAGNOSIS — J309 Allergic rhinitis, unspecified: Secondary | ICD-10-CM | POA: Diagnosis not present

## 2017-12-01 DIAGNOSIS — E119 Type 2 diabetes mellitus without complications: Secondary | ICD-10-CM | POA: Diagnosis not present

## 2017-12-01 DIAGNOSIS — J069 Acute upper respiratory infection, unspecified: Secondary | ICD-10-CM | POA: Diagnosis not present

## 2017-12-06 ENCOUNTER — Other Ambulatory Visit: Payer: Self-pay | Admitting: Gastroenterology

## 2017-12-07 ENCOUNTER — Telehealth: Payer: Self-pay | Admitting: Gastroenterology

## 2017-12-07 MED ORDER — OMEPRAZOLE 20 MG PO CPDR: 20.0000 mg | DELAYED_RELEASE_CAPSULE | Freq: Two times a day (BID) | ORAL | 3 refills | Status: DC

## 2017-12-07 NOTE — Telephone Encounter (Signed)
Omeprazole sent to pharmacy.

## 2018-01-03 DIAGNOSIS — M5416 Radiculopathy, lumbar region: Secondary | ICD-10-CM | POA: Diagnosis not present

## 2018-01-11 DIAGNOSIS — M5416 Radiculopathy, lumbar region: Secondary | ICD-10-CM | POA: Diagnosis not present

## 2018-01-17 ENCOUNTER — Ambulatory Visit (INDEPENDENT_AMBULATORY_CARE_PROVIDER_SITE_OTHER): Payer: Medicare Other | Admitting: Women's Health

## 2018-01-17 ENCOUNTER — Encounter: Payer: Self-pay | Admitting: Women's Health

## 2018-01-17 VITALS — BP 120/78 | Ht 62.0 in | Wt 141.0 lb

## 2018-01-17 DIAGNOSIS — N952 Postmenopausal atrophic vaginitis: Secondary | ICD-10-CM

## 2018-01-17 DIAGNOSIS — Z01419 Encounter for gynecological examination (general) (routine) without abnormal findings: Secondary | ICD-10-CM

## 2018-01-17 DIAGNOSIS — G8929 Other chronic pain: Secondary | ICD-10-CM | POA: Diagnosis not present
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
## 2018-01-17 NOTE — Patient Instructions (Signed)
Health Maintenance for Postmenopausal Women Menopause is a normal process in which your reproductive ability comes to an end. This process happens gradually over a span of months to years, usually between the ages of 22 and 9. Menopause is complete when you have missed 12 consecutive menstrual periods. It is important to talk with your health care provider about some of the most common conditions that affect postmenopausal women, such as heart disease, cancer, and bone loss (osteoporosis). Adopting a healthy lifestyle and getting preventive care can help to promote your health and wellness. Those actions can also lower your chances of developing some of these common conditions. What should I know about menopause? During menopause, you may experience a number of symptoms, such as:  Moderate-to-severe hot flashes.  Night sweats.  Decrease in sex drive.  Mood swings.  Headaches.  Tiredness.  Irritability.  Memory problems.  Insomnia.  Choosing to treat or not to treat menopausal changes is an individual decision that you make with your health care provider. What should I know about hormone replacement therapy and supplements? Hormone therapy products are effective for treating symptoms that are associated with menopause, such as hot flashes and night sweats. Hormone replacement carries certain risks, especially as you become older. If you are thinking about using estrogen or estrogen with progestin treatments, discuss the benefits and risks with your health care provider. What should I know about heart disease and stroke? Heart disease, heart attack, and stroke become more likely as you age. This may be due, in part, to the hormonal changes that your body experiences during menopause. These can affect how your body processes dietary fats, triglycerides, and cholesterol. Heart attack and stroke are both medical emergencies. There are many things that you can do to help prevent heart disease  and stroke:  Have your blood pressure checked at least every 1-2 years. High blood pressure causes heart disease and increases the risk of stroke.  If you are 53-22 years old, ask your health care provider if you should take aspirin to prevent a heart attack or a stroke.  Do not use any tobacco products, including cigarettes, chewing tobacco, or electronic cigarettes. If you need help quitting, ask your health care provider.  It is important to eat a healthy diet and maintain a healthy weight. ? Be sure to include plenty of vegetables, fruits, low-fat dairy products, and lean protein. ? Avoid eating foods that are high in solid fats, added sugars, or salt (sodium).  Get regular exercise. This is one of the most important things that you can do for your health. ? Try to exercise for at least 150 minutes each week. The type of exercise that you do should increase your heart rate and make you sweat. This is known as moderate-intensity exercise. ? Try to do strengthening exercises at least twice each week. Do these in addition to the moderate-intensity exercise.  Know your numbers.Ask your health care provider to check your cholesterol and your blood glucose. Continue to have your blood tested as directed by your health care provider.  What should I know about cancer screening? There are several types of cancer. Take the following steps to reduce your risk and to catch any cancer development as early as possible. Breast Cancer  Practice breast self-awareness. ? This means understanding how your breasts normally appear and feel. ? It also means doing regular breast self-exams. Let your health care provider know about any changes, no matter how small.  If you are 40  or older, have a clinician do a breast exam (clinical breast exam or CBE) every year. Depending on your age, family history, and medical history, it may be recommended that you also have a yearly breast X-ray (mammogram).  If you  have a family history of breast cancer, talk with your health care provider about genetic screening.  If you are at high risk for breast cancer, talk with your health care provider about having an MRI and a mammogram every year.  Breast cancer (BRCA) gene test is recommended for women who have family members with BRCA-related cancers. Results of the assessment will determine the need for genetic counseling and BRCA1 and for BRCA2 testing. BRCA-related cancers include these types: ? Breast. This occurs in males or females. ? Ovarian. ? Tubal. This may also be called fallopian tube cancer. ? Cancer of the abdominal or pelvic lining (peritoneal cancer). ? Prostate. ? Pancreatic.  Cervical, Uterine, and Ovarian Cancer Your health care provider may recommend that you be screened regularly for cancer of the pelvic organs. These include your ovaries, uterus, and vagina. This screening involves a pelvic exam, which includes checking for microscopic changes to the surface of your cervix (Pap test).  For women ages 21-65, health care providers may recommend a pelvic exam and a Pap test every three years. For women ages 79-65, they may recommend the Pap test and pelvic exam, combined with testing for human papilloma virus (HPV), every five years. Some types of HPV increase your risk of cervical cancer. Testing for HPV may also be done on women of any age who have unclear Pap test results.  Other health care providers may not recommend any screening for nonpregnant women who are considered low risk for pelvic cancer and have no symptoms. Ask your health care provider if a screening pelvic exam is right for you.  If you have had past treatment for cervical cancer or a condition that could lead to cancer, you need Pap tests and screening for cancer for at least 20 years after your treatment. If Pap tests have been discontinued for you, your risk factors (such as having a new sexual partner) need to be  reassessed to determine if you should start having screenings again. Some women have medical problems that increase the chance of getting cervical cancer. In these cases, your health care provider may recommend that you have screening and Pap tests more often.  If you have a family history of uterine cancer or ovarian cancer, talk with your health care provider about genetic screening.  If you have vaginal bleeding after reaching menopause, tell your health care provider.  There are currently no reliable tests available to screen for ovarian cancer.  Lung Cancer Lung cancer screening is recommended for adults 69-62 years old who are at high risk for lung cancer because of a history of smoking. A yearly low-dose CT scan of the lungs is recommended if you:  Currently smoke.  Have a history of at least 30 pack-years of smoking and you currently smoke or have quit within the past 15 years. A pack-year is smoking an average of one pack of cigarettes per day for one year.  Yearly screening should:  Continue until it has been 15 years since you quit.  Stop if you develop a health problem that would prevent you from having lung cancer treatment.  Colorectal Cancer  This type of cancer can be detected and can often be prevented.  Routine colorectal cancer screening usually begins at  age 78 and continues through age 77.  If you have risk factors for colon cancer, your health care provider may recommend that you be screened at an earlier age.  If you have a family history of colorectal cancer, talk with your health care provider about genetic screening.  Your health care provider may also recommend using home test kits to check for hidden blood in your stool.  A small camera at the end of a tube can be used to examine your colon directly (sigmoidoscopy or colonoscopy). This is done to check for the earliest forms of colorectal cancer.  Direct examination of the colon should be repeated every  5-10 years until age 73. However, if early forms of precancerous polyps or small growths are found or if you have a family history or genetic risk for colorectal cancer, you may need to be screened more often.  Skin Cancer  Check your skin from head to toe regularly.  Monitor any moles. Be sure to tell your health care provider: ? About any new moles or changes in moles, especially if there is a change in a mole's shape or color. ? If you have a mole that is larger than the size of a pencil eraser.  If any of your family members has a history of skin cancer, especially at a Thaddeus Evitts age, talk with your health care provider about genetic screening.  Always use sunscreen. Apply sunscreen liberally and repeatedly throughout the day.  Whenever you are outside, protect yourself by wearing long sleeves, pants, a wide-brimmed hat, and sunglasses.  What should I know about osteoporosis? Osteoporosis is a condition in which bone destruction happens more quickly than new bone creation. After menopause, you may be at an increased risk for osteoporosis. To help prevent osteoporosis or the bone fractures that can happen because of osteoporosis, the following is recommended:  If you are 19-49 years old, get at least 1,000 mg of calcium and at least 600 mg of vitamin D per day.  If you are older than age 27 but younger than age 79, get at least 1,200 mg of calcium and at least 600 mg of vitamin D per day.  If you are older than age 72, get at least 1,200 mg of calcium and at least 800 mg of vitamin D per day.  Smoking and excessive alcohol intake increase the risk of osteoporosis. Eat foods that are rich in calcium and vitamin D, and do weight-bearing exercises several times each week as directed by your health care provider. What should I know about how menopause affects my mental health? Depression may occur at any age, but it is more common as you become older. Common symptoms of depression  include:  Low or sad mood.  Changes in sleep patterns.  Changes in appetite or eating patterns.  Feeling an overall lack of motivation or enjoyment of activities that you previously enjoyed.  Frequent crying spells.  Talk with your health care provider if you think that you are experiencing depression. What should I know about immunizations? It is important that you get and maintain your immunizations. These include:  Tetanus, diphtheria, and pertussis (Tdap) booster vaccine.  Influenza every year before the flu season begins.  Pneumonia vaccine.  Shingles vaccine.  Your health care provider may also recommend other immunizations. This information is not intended to replace advice given to you by your health care provider. Make sure you discuss any questions you have with your health care provider. Document Released: 02/04/2006  Document Revised: 07/02/2016 Document Reviewed: 09/16/2015 Elsevier Interactive Patient Education  2018 [employer].  

## 2018-01-24 ENCOUNTER — Telehealth: Payer: Self-pay | Admitting: Gastroenterology

## 2018-01-24 ENCOUNTER — Other Ambulatory Visit: Payer: Self-pay | Admitting: Gastroenterology

## 2018-01-24 NOTE — Telephone Encounter (Signed)
We did not deny her med we sent in her refills today  Called patient and since she called [place] [facility] called her to tell her that her prescription was ready

## 2018-01-30 DIAGNOSIS — M5416 Radiculopathy, lumbar region: Secondary | ICD-10-CM | POA: Diagnosis not present

## 2018-02-03 ENCOUNTER — Other Ambulatory Visit: Payer: Self-pay

## 2018-02-03 MED ORDER — HYOSCYAMINE SULFATE 0.125 MG SL SUBL: SUBLINGUAL_TABLET | SUBLINGUAL | 1 refills | Status: DC

## 2018-02-03 MED ORDER — OMEPRAZOLE 20 MG PO CPDR: 20.0000 mg | DELAYED_RELEASE_CAPSULE | Freq: Two times a day (BID) | ORAL | 1 refills | Status: DC

## 2018-02-06 ENCOUNTER — Other Ambulatory Visit: Payer: Self-pay

## 2018-02-06 MED ORDER — HYOSCYAMINE SULFATE 0.125 MG SL SUBL: SUBLINGUAL_TABLET | SUBLINGUAL | 1 refills | Status: DC

## 2018-02-24 DIAGNOSIS — M5416 Radiculopathy, lumbar region: Secondary | ICD-10-CM | POA: Diagnosis not present

## 2018-02-28 DIAGNOSIS — J302 Other seasonal allergic rhinitis: Secondary | ICD-10-CM | POA: Diagnosis not present

## 2018-03-15 DIAGNOSIS — M5416 Radiculopathy, lumbar region: Secondary | ICD-10-CM | POA: Diagnosis not present

## 2018-03-15 DIAGNOSIS — E119 Type 2 diabetes mellitus without complications: Secondary | ICD-10-CM | POA: Diagnosis not present

## 2018-03-28 ENCOUNTER — Encounter: Payer: Self-pay | Admitting: Women's Health

## 2018-03-28 ENCOUNTER — Ambulatory Visit (INDEPENDENT_AMBULATORY_CARE_PROVIDER_SITE_OTHER): Payer: Medicare Other | Admitting: Women's Health

## 2018-03-28 VITALS — BP 118/92 | Temp 97.1°F

## 2018-03-28 DIAGNOSIS — N898 Other specified noninflammatory disorders of vagina: Secondary | ICD-10-CM | POA: Diagnosis not present

## 2018-03-28 LAB — WET PREP FOR TRICH, YEAST, CLUE

## 2018-03-28 MED ORDER — TINIDAZOLE 500 MG PO TABS: 500.0000 mg | ORAL_TABLET | Freq: Once | ORAL | 0 refills | Status: AC

## 2018-03-28 MED ORDER — METRONIDAZOLE 500 MG PO TABS: 500.0000 mg | ORAL_TABLET | Freq: Two times a day (BID) | ORAL | 0 refills | Status: DC

## 2018-03-28 NOTE — Patient Instructions (Signed)

## 2018-03-28 NOTE — Progress Notes (Signed)
72 year old MBF G3 P2 presents with complaint of left labial/vaginal irritation and questionable bump on left labia for the past few days.  States it is uncomfortable with wiping.  Denies visible vaginal discharge, abdominal pain, fever but is having occasional hot flashes.  Temp 97.1 history of trichomonas and is using condoms.  Hysterectomy on no HRT.  Medical problems include hypertension, hypercholesteremia, IBS and GERD.  Chronic back pain is having steroid injections.  Exam: Appears well.  No CVAT.  Abdomen soft without rebound or radiation, external genitalia no visible bump, blister or lesion noted on left labia.  Wet prep with a Q-tip positive for clues, many bacteria.  Bimanual no adnexal tenderness.  Bacterial vaginosis  Plan: Reassurance given regarding exam of no visible lesion, blister.  Flagyl 500 twice daily for 7 days, alcohol precautions reviewed.  Instructed to call if no relief of symptoms.

## 2018-04-04 DIAGNOSIS — E119 Type 2 diabetes mellitus without complications: Secondary | ICD-10-CM | POA: Diagnosis not present

## 2018-04-05 DIAGNOSIS — M5416 Radiculopathy, lumbar region: Secondary | ICD-10-CM | POA: Diagnosis not present

## 2018-04-20 DIAGNOSIS — Z1231 Encounter for screening mammogram for malignant neoplasm of breast: Secondary | ICD-10-CM | POA: Diagnosis not present

## 2018-04-24 DIAGNOSIS — E119 Type 2 diabetes mellitus without complications: Secondary | ICD-10-CM | POA: Diagnosis not present

## 2018-04-24 DIAGNOSIS — I10 Essential (primary) hypertension: Secondary | ICD-10-CM | POA: Diagnosis not present

## 2018-04-24 DIAGNOSIS — K219 Gastro-esophageal reflux disease without esophagitis: Secondary | ICD-10-CM | POA: Diagnosis not present

## 2018-04-24 DIAGNOSIS — F419 Anxiety disorder, unspecified: Secondary | ICD-10-CM | POA: Diagnosis not present

## 2018-04-25 DIAGNOSIS — G4733 Obstructive sleep apnea (adult) (pediatric): Secondary | ICD-10-CM | POA: Diagnosis not present

## 2018-05-01 DIAGNOSIS — M5416 Radiculopathy, lumbar region: Secondary | ICD-10-CM | POA: Diagnosis not present

## 2018-05-03 DIAGNOSIS — I10 Essential (primary) hypertension: Secondary | ICD-10-CM | POA: Diagnosis not present

## 2018-05-03 DIAGNOSIS — R0789 Other chest pain: Secondary | ICD-10-CM | POA: Diagnosis not present

## 2018-05-03 DIAGNOSIS — E785 Hyperlipidemia, unspecified: Secondary | ICD-10-CM | POA: Diagnosis not present

## 2018-05-03 DIAGNOSIS — E119 Type 2 diabetes mellitus without complications: Secondary | ICD-10-CM | POA: Diagnosis not present

## 2018-05-04 DIAGNOSIS — G4733 Obstructive sleep apnea (adult) (pediatric): Secondary | ICD-10-CM | POA: Diagnosis not present

## 2018-05-24 DIAGNOSIS — S60221A Contusion of right hand, initial encounter: Secondary | ICD-10-CM | POA: Diagnosis not present

## 2018-05-24 DIAGNOSIS — M25522 Pain in left elbow: Secondary | ICD-10-CM | POA: Diagnosis not present

## 2018-05-24 DIAGNOSIS — M1990 Unspecified osteoarthritis, unspecified site: Secondary | ICD-10-CM | POA: Diagnosis not present

## 2018-05-24 DIAGNOSIS — E119 Type 2 diabetes mellitus without complications: Secondary | ICD-10-CM | POA: Diagnosis not present

## 2018-05-24 DIAGNOSIS — M25521 Pain in right elbow: Secondary | ICD-10-CM | POA: Diagnosis not present

## 2018-05-24 DIAGNOSIS — S300XXA Contusion of lower back and pelvis, initial encounter: Secondary | ICD-10-CM | POA: Diagnosis not present

## 2018-05-24 DIAGNOSIS — M79641 Pain in right hand: Secondary | ICD-10-CM | POA: Diagnosis not present

## 2018-05-24 DIAGNOSIS — S9001XA Contusion of right ankle, initial encounter: Secondary | ICD-10-CM | POA: Diagnosis not present

## 2018-05-24 DIAGNOSIS — M25571 Pain in right ankle and joints of right foot: Secondary | ICD-10-CM | POA: Diagnosis not present

## 2018-05-29 DIAGNOSIS — M1712 Unilateral primary osteoarthritis, left knee: Secondary | ICD-10-CM | POA: Diagnosis not present

## 2018-05-29 DIAGNOSIS — M79604 Pain in right leg: Secondary | ICD-10-CM | POA: Diagnosis not present

## 2018-05-29 DIAGNOSIS — M1711 Unilateral primary osteoarthritis, right knee: Secondary | ICD-10-CM | POA: Diagnosis not present

## 2018-06-08 DIAGNOSIS — H353110 Nonexudative age-related macular degeneration, right eye, stage unspecified: Secondary | ICD-10-CM | POA: Diagnosis not present

## 2018-06-08 DIAGNOSIS — H353120 Nonexudative age-related macular degeneration, left eye, stage unspecified: Secondary | ICD-10-CM | POA: Diagnosis not present

## 2018-06-08 DIAGNOSIS — H25813 Combined forms of age-related cataract, bilateral: Secondary | ICD-10-CM | POA: Diagnosis not present

## 2018-06-08 DIAGNOSIS — E119 Type 2 diabetes mellitus without complications: Secondary | ICD-10-CM | POA: Diagnosis not present

## 2018-06-19 ENCOUNTER — Encounter: Payer: Self-pay | Admitting: Women's Health

## 2018-06-19 ENCOUNTER — Telehealth: Payer: Self-pay | Admitting: *Deleted

## 2018-06-19 ENCOUNTER — Ambulatory Visit (INDEPENDENT_AMBULATORY_CARE_PROVIDER_SITE_OTHER): Payer: Medicare Other | Admitting: Women's Health

## 2018-06-19 VITALS — BP 128/80

## 2018-06-19 DIAGNOSIS — N760 Acute vaginitis: Secondary | ICD-10-CM | POA: Diagnosis not present

## 2018-06-19 DIAGNOSIS — N898 Other specified noninflammatory disorders of vagina: Secondary | ICD-10-CM

## 2018-06-19 DIAGNOSIS — R300 Dysuria: Secondary | ICD-10-CM | POA: Diagnosis not present

## 2018-06-19 DIAGNOSIS — N3000 Acute cystitis without hematuria: Secondary | ICD-10-CM

## 2018-06-19 DIAGNOSIS — B9689 Other specified bacterial agents as the cause of diseases classified elsewhere: Secondary | ICD-10-CM | POA: Diagnosis not present

## 2018-06-19 LAB — WET PREP FOR TRICH, YEAST, CLUE

## 2018-06-19 MED ORDER — SULFAMETHOXAZOLE-TRIMETHOPRIM 800-160 MG PO TABS: 1.0000 | ORAL_TABLET | Freq: Two times a day (BID) | ORAL | 0 refills | Status: DC

## 2018-06-19 MED ORDER — TINIDAZOLE 500 MG PO TABS
ORAL_TABLET | ORAL | 0 refills | Status: DC
Start: 2018-06-19 — End: 2018-07-17

## 2018-06-19 MED ORDER — METRONIDAZOLE 0.75 % VA GEL: VAGINAL | 0 refills | Status: DC

## 2018-06-19 NOTE — Patient Instructions (Addendum)
dont take septra until we call with urine results  Take metrogel today  Urinary Tract Infection, Adult A urinary tract infection (UTI) is an infection of any part of the urinary tract. The urinary tract includes the:  Kidneys.  Ureters.  Bladder.  Urethra.  These organs make, store, and get rid of pee (urine) in the body. Follow these instructions at home:  Take over-the-counter and prescription medicines only as told by your doctor.  If you were prescribed an antibiotic medicine, take it as told by your doctor. Do not stop taking the antibiotic even if you start to feel better.  Avoid the following drinks: ? Alcohol. ? Caffeine. ? Tea. ? Carbonated drinks.  Drink enough fluid to keep your pee clear or pale yellow.  Keep all follow-up visits as told by your doctor. This is important.  Make sure to: ? Empty your bladder often and completely. Do not to hold pee for long periods of time. ? Empty your bladder before and after sex. ? Wipe from front to back after a bowel movement if you are female. Use each tissue one time when you wipe. Contact a doctor if:  You have back pain.  You have a fever.  You feel sick to your stomach (nauseous).  You throw up (vomit).  Your symptoms do not get better after 3 days.  Your symptoms go away and then come back. Get help right away if:  You have very bad back pain.  You have very bad lower belly (abdominal) pain.  You are throwing up and cannot keep down any medicines or water. This information is not intended to replace advice given to you by your health care provider. Make sure you discuss any questions you have with your health care provider. Document Released: 05/31/2008 Document Revised: 05/20/2016 Document Reviewed: 11/03/2015 Elsevier Interactive Patient Education  [employer].

## 2018-06-19 NOTE — Addendum Note (Signed)
Addended by: Janalyn Harder A on: 06/19/2018 11:30 AM   Modules accepted: Orders

## 2018-06-19 NOTE — Progress Notes (Signed)
72 year old MPF G3 P2 presents with complaint of urinary burning, low abdomen/bladder pressure, questionable fever, and low back pain for the past 3 days, symptoms increasing.  Denies increased frequency or urgency, pain at end of stream but has noticed occasional urinary "leakage".  Minimal vaginal discharge.  History of a TAH on no HRT.  Medical problems include hypertension, diabetes, hypercholesteremia.  Exam: Appears well.  No CVAT.  Pain more in the low sacral area, external genitalia within normal limits, speculum exam scant white to yellow discharge without odor noted, minimal erythema, wet prep positive for clues, TNTC bacteria.  Bimanual no adnexal tenderness. UA: +1 leukocytes, negative nitrites, urine cloudy, 10-20 WBCs, 6-10 squamous epithelials, moderate bacteria  Bacteria vaginosis Questionable UTI  Plan: MetroGel vaginal cream 1 applicator at bedtime x5,  (requested something different than 7 days of pills).  Urine culture pending.  Instructed to call if increased urinary symptoms or if continued problems.

## 2018-06-19 NOTE — Telephone Encounter (Signed)
Patient was seen today Rx for metrogel cream is $47.00 too much per patient, told to call if medication too expensive. Please advise

## 2018-06-20 NOTE — Telephone Encounter (Signed)
I think we sent in new med, tindamax

## 2018-06-20 NOTE — Telephone Encounter (Signed)
Kristen Mahoney did you see this message?

## 2018-06-21 LAB — URINALYSIS, COMPLETE W/RFL CULTURE
BILIRUBIN URINE: NEGATIVE
Glucose, UA: NEGATIVE
HGB URINE DIPSTICK: NEGATIVE
HYALINE CAST: NONE SEEN /LPF
KETONES UR: NEGATIVE
Nitrites, Initial: NEGATIVE
PROTEIN: NEGATIVE
RBC / HPF: NONE SEEN /HPF (ref 0–2)
SPECIFIC GRAVITY, URINE: 1.02 (ref 1.001–1.03)
pH: 5.5 (ref 5.0–8.0)

## 2018-06-21 LAB — URINE CULTURE
MICRO NUMBER:: 90756973
SPECIMEN QUALITY:: ADEQUATE

## 2018-06-21 LAB — CULTURE INDICATED

## 2018-06-23 DIAGNOSIS — M48062 Spinal stenosis, lumbar region with neurogenic claudication: Secondary | ICD-10-CM | POA: Diagnosis not present

## 2018-06-23 DIAGNOSIS — R202 Paresthesia of skin: Secondary | ICD-10-CM | POA: Diagnosis not present

## 2018-06-23 DIAGNOSIS — M79605 Pain in left leg: Secondary | ICD-10-CM | POA: Diagnosis not present

## 2018-06-23 DIAGNOSIS — M79604 Pain in right leg: Secondary | ICD-10-CM | POA: Diagnosis not present

## 2018-07-11 DIAGNOSIS — H25813 Combined forms of age-related cataract, bilateral: Secondary | ICD-10-CM | POA: Diagnosis not present

## 2018-07-11 DIAGNOSIS — H353110 Nonexudative age-related macular degeneration, right eye, stage unspecified: Secondary | ICD-10-CM | POA: Diagnosis not present

## 2018-07-11 DIAGNOSIS — H353120 Nonexudative age-related macular degeneration, left eye, stage unspecified: Secondary | ICD-10-CM | POA: Diagnosis not present

## 2018-07-11 DIAGNOSIS — E119 Type 2 diabetes mellitus without complications: Secondary | ICD-10-CM | POA: Diagnosis not present

## 2018-07-13 DIAGNOSIS — H25042 Posterior subcapsular polar age-related cataract, left eye: Secondary | ICD-10-CM | POA: Diagnosis not present

## 2018-07-13 DIAGNOSIS — H2512 Age-related nuclear cataract, left eye: Secondary | ICD-10-CM | POA: Diagnosis not present

## 2018-07-13 DIAGNOSIS — H2511 Age-related nuclear cataract, right eye: Secondary | ICD-10-CM | POA: Diagnosis not present

## 2018-07-13 DIAGNOSIS — H353132 Nonexudative age-related macular degeneration, bilateral, intermediate dry stage: Secondary | ICD-10-CM | POA: Diagnosis not present

## 2018-07-14 DIAGNOSIS — M533 Sacrococcygeal disorders, not elsewhere classified: Secondary | ICD-10-CM | POA: Diagnosis not present

## 2018-07-17 ENCOUNTER — Ambulatory Visit (INDEPENDENT_AMBULATORY_CARE_PROVIDER_SITE_OTHER): Payer: Medicare Other | Admitting: Neurology

## 2018-07-17 ENCOUNTER — Encounter: Payer: Self-pay | Admitting: Neurology

## 2018-07-17 VITALS — BP 129/77 | HR 91 | Ht 62.0 in | Wt 143.0 lb

## 2018-07-17 DIAGNOSIS — G4733 Obstructive sleep apnea (adult) (pediatric): Secondary | ICD-10-CM

## 2018-07-17 DIAGNOSIS — Z9989 Dependence on other enabling machines and devices: Secondary | ICD-10-CM | POA: Diagnosis not present

## 2018-07-17 NOTE — Progress Notes (Signed)
S[facility]EP [facility]INE [facility]   Provider:  Larey Seat, M D  Referring Provider: [name], MD Primary Care Physician:  [name], MD  Chief Complaint  Patient presents with  . Follow-up    pt alone, rm 10. pt states that things are going well. DME [facility]     HPI: I have pleasure of seeing Kristen Mahoney today who has been a CPAP user since 2008 when she was originally diagnosed with obstructive sleep apnea at an AHI of 29.8. She achieved a lot of weight loss and was for this reason placed on an auto titrating. Last year the 95%t percentile pressure was 12.5 cm water and this year it is 10.8 cm water. This correlates with further weight loss, 23 pounds.  A residual AHI is 3.2 but the majority is central in nature, she continues on her AutoSet was 1 cm EPR she has used to machine 28 out of 30 days but only 18 days for longer than 4 hours. Her sleep has been more fragmented since she is depressed and she wakes up in the middle of the night having removed the mask. She has a dry mouth . This was not all intentional weight loss, some of this may be attributed to depression. She felt severely depressed she had finally weaned off alprazolam, and she was started on an SSRI, namely Zoloft. She still has a feeling of in a unrest and jitteriness, anxiety and she is excessively daytime sleepy. All this fits depression. Her symptoms have gotten better since she started Zoloft but they have not been yet controlled. I think it may help if she also exercises daily. She has rectal-stool incontinence which makes it hard for her to swim. Epworth 6 points, FSS 20 points.   I have not encountered Kristen Mahoney and a little over 3 years. The patient is here as a new patient as-established patient revisit. She has seen me in the past for her sleep disorder which is obstructive sleep apnea. Her primary care physician is now [place]. She needs new CPAP supplies and ongoing follow-up and for this reason  brought his CPAP machine here today. As I have seen Kristen Mahoney last she has become a grandmother her grandson just celebrated his second birthday. She is a mother of 2 adult children. She is retired. He is also followed by orthopedist Dr. Elesa Massed and by cardiologist Dr. Terrence Dupont . She has diabetes which has been well controlled as long as she was able to exercise and walk. Due to knee pain she has been less able to exercise. She has no medication side effects that she reports at this time she feels extraordinarily well but she needs new supplies.  Kristen Mahoney's sleep study from 09-05-2007 revealed an AHI of 29.8, supine AHI of 47.4 and nonsupine of 11.1 REM AHI of 32. Periodic leg movements for a few was an arousal index of 3.6 she was snoring. She was titrated to CPAP but then placed on an auto titrator as she achieved weight loss.  I was able to review the patient's last laboratory results her hemoglobin A1c was elevated after receiving cortisone shots into the knee at 7.7. This has been higher than usual for her. No cause was 160s 3. Cholesterol is controlled. Also she has a discrepancy between LDL and HDL. Rank[place] once to make sure that the patient is followed for her CPAP and compliance.  Her sleep habits are described as follows, she usually goes to bed late and  watches TV usually a movie at about 2 AM before finally going to sleep. Fall asleep in front of the TV. The TV in the bedroom. She stated that her husband rarely disturbs her sleep and he may occasionally snore. But she is moving restlessly. She may have one bathroom break at night but usually nocturia does not fragment his sleep. And she rises in the morning at 10 AM. Today's an unusual early day for her now in retirement. She may drink some caffeine in the morning. She has breakfast at home. She is trying to exercise by walking.  She had a CPAP first in 2014.  Interval history from 07/13/2016. Kristen Mahoney apnea has now been well controlled by  using an AutoSet between 5 and 15 cm water was 1 cm EPR, the 95th percentile pressure is 12.5 cm her apnea index is 5.0 there still 3.5 obstructive apneas per hour but these are negligible. She is using her machine 7 hours 1 minute each day on average and has a 97% compliance for over 4 hours of use. Based on these data I will see the patient again in one year. Her Epworth sleepiness score today was endorsed at 7 and her fatigue severity at 15 points. There is no evidence of clinical depression.  07-17-2018 , Kristen Mahoney is a 72 y.o. female  is seen here as a  revisit  from Dr. Radene Ou for sleep medicine evaluation, CPAP compliance. I have followed and Kristen Mahoney since 2008 when I first diagnosed her with sleep apnea.  She has become 1 of my most compliant long-term CPAP users, continuing to use her machine at 97% compliance 29 out of 30 days with an average use at time of 5 hours 48 minutes.  The machine is an AutoSet relatively new with a pressure window between 5 and 15 cmH2O 1 cm EPR and a residual AHI of only 2.7.  She does have some central apneas emerging and for this reason we have not raise the pressure any further, the 95th percentile pressure is 10.8 cmH2O.  I see no reason to adjust her pressures she is doing well with the current settings and I will encourage her to use her CPAP as much as she possibly can especially when she travels and also when she takes naps.  She has benefited from CPAP therapy her Epworth sleepiness score has been continuously under 10 points and is today again endorsed at 7 points, fatigue severity was 15 points.  There is also no evidence of clinical depression also she conveyed that she was a little overwhelmed and emotional as she had to arrange the memorial services for 2 sorority sisters without family of their own. She is awaiting cataract surgery.   Review of Systems: Out of a complete 14 system review, the patient complains of only the following symptoms, and all other  reviewed systems are negative. Big toe pain,  Running up to the knee- DM neuropathy? Gout.  The patient has transiently suffered from higher blood pressures on higher blood glucose levels due to the steroid treatment of her knee pain. She further has reported snoring at times. Vision joint pain aching muscles and diarrhea and dizziness.  The patient still socially drinks some alcohol she drinks her coffee or caffeinated sodas occasionally but certainly not daily she is a retired Sales executive. She lives with her husband. Nausea, jitteriness, bad taste in the mouth, drop in CPAP comlpiance.  Unintentional weight loss. Thyroid normal. grief reaction. She enjoyed her  journey to [place].     Social History   Socioeconomic History  . Marital status: Married    Spouse name: Not on file  . Number of children: 2  . Years of education: Not on file  . Highest education level: Not on file  Occupational History  . Occupation: Retired    Fish farm manager: RETIRED  Social Needs  . Financial resource strain: Not on file  . Food insecurity:    Worry: Not on file    Inability: Not on file  . Transportation needs:    Medical: Not on file    Non-medical: Not on file  Tobacco Use  . Smoking status: Former Research scientist (life sciences)  . Smokeless tobacco: Never Used  Substance and Sexual Activity  . Alcohol use: Yes    Comment: occasional Rare  . Drug use: No  . Sexual activity: Yes    Birth control/protection: Surgical  Lifestyle  . Physical activity:    Days per week: Not on file    Minutes per session: Not on file  . Stress: Not on file  Relationships  . Social connections:    Talks on phone: Not on file    Gets together: Not on file    Attends religious service: Not on file    Active member of club or organization: Not on file    Attends meetings of clubs or organizations: Not on file    Relationship status: Not on file  . Intimate partner violence:    Fear of current or ex partner: Not on file    Emotionally abused:  Not on file    Physically abused: Not on file    Forced sexual activity: Not on file  Other Topics Concern  . Not on file  Social History Narrative   Denies caffeine use.    Family History  Problem Relation Age of Onset  . Diabetes Mother   . Diabetes Father   . Colon cancer Father   . Heart disease Father   . Hypertension Father   . Diabetes Sister   . Hypertension Sister   . Diabetes Brother   . Colon polyps Brother   . Prostate cancer Brother   . Breast cancer Paternal Aunt        Age 30's  . Colon polyps Brother   . Heart disease Brother   . Stroke Brother   . Hyperlipidemia Brother   . Colon polyps Sister   . Diabetes Sister   . Hypertension Sister   . Heart disease Sister   . Esophageal cancer Neg Hx   . Rectal cancer Neg Hx   . Stomach cancer Neg Hx     Past Medical History:  Diagnosis Date  . Allergy    seasonal  . Anxiety   . Arthritis    knees,all over  . Basal cell carcinoma   . Benign breast cyst in female    PATIENT HAS HISTORY OF BREAST CYSTS  . Cataract    beginning bilateral  . Diabetes mellitus   . GERD (gastroesophageal reflux disease)   . Herpes progenitalis   . Hyperlipidemia   . Hypertension   . Kidney stone   . OSA on CPAP   . Sleep apnea    cpap  . Urinary, incontinence, stress female   . Vertigo     Past Surgical History:  Procedure Laterality Date  . ABDOMINAL HYSTERECTOMY  1986   leiomyomata  . Basal cell excised    . breast cystectomy  right  . CHOLECYSTECTOMY  2003  . COLONOSCOPY    . ear cystectomy    . ESOPHAGOGASTRODUODENOSCOPY    . KNEE ARTHROSCOPY     right  . LITHOTRIPSY    . RECTOVAGINAL FISTULA CLOSURE      Current Outpatient Medications  Medication Sig Dispense Refill  . aspirin EC 81 MG tablet Take 81 mg by mouth every evening.     [name] Kitchen atorvastatin (LIPITOR) 20 MG tablet Take 20 mg by mouth daily.     [name] Kitchen bismuth subsalicylate (PEPTO BISMOL) 262 MG/15ML suspension Take 30 mLs by mouth every 6 (six)  hours as needed for indigestion.    [name] Kitchen buPROPion (ZYBAN) 150 MG 12 hr tablet Take 150 mg by mouth daily. Start after finishing fluoxetine    . hyoscyamine (LEVSIN SL) 0.125 MG SL tablet DISSOLVE 1 TABLET IN MOUTH EVERY 6 HOURS AS NEEDED FOR  CRAMPING 270 tablet 1  . metFORMIN (GLUCOPHAGE) 500 MG tablet Take 1,000 mg by mouth 2 (two) times daily with a meal.     . metoprolol succinate (TOPROL XL) 50 MG 24 hr tablet Take 1 tablet (50 mg total) by mouth daily. Take with or immediately following a meal. 30 tablet 0  . Multiple Vitamins-Minerals (ICAPS) CAPS Take by mouth.    [name] Kitchen omeprazole (PRILOSEC) 20 MG capsule Take 1 capsule (20 mg total) by mouth 2 (two) times daily before a meal. 180 capsule 1  . Vitamin D, Cholecalciferol, 400 units TABS Take by mouth.    . vitamin E (VITAMIN E) 400 UNIT capsule Take 400 Units by mouth daily.     Current Facility-Administered Medications  Medication Dose Route Frequency Provider Last Rate Last Dose  . 0.9 %  sodium chloride infusion  500 mL Intravenous Continuous Nandigam, Kavitha V, MD      . betamethasone acetate-betamethasone sodium phosphate (CELESTONE) injection 3 mg  3 mg Intramuscular Once Edrick Kins, DPM        Allergies as of 07/17/2018 - Review Complete 07/17/2018  Allergen Reaction Noted  . Ciprofloxacin Rash 09/04/2009  . Morphine and related Itching 05/07/2011    Vitals: BP 129/77   Pulse 91   Ht 5\' 2"  (1.575 m)   Wt 143 lb (64.9 kg)   BMI 26.16 kg/m  Last Weight:  Wt Readings from Last 1 Encounters:  07/17/18 143 lb (64.9 kg)       Last Height:   Ht Readings from Last 1 Encounters:  07/17/18 5\' 2"  (1.575 m)    Physical exam:  General: The patient is awake, alert and appears not in acute distress. The patient is well groomed. Head: Normocephalic, atraumatic. Neck is supple. Mallampati 3   neck circumference: 14 inches. Nasal airflow ; allergies affected. TMJ is  Not  evident . Retrognathia is seen.  Cardiovascular:  Regular  rate and rhythm without  murmurs or carotid bruit, and without distended neck veins. Respiratory: Lungs are clear to auscultation. Skin:  Without evidence of edema, or rash. Trunk: BMI is elevated and patient  has normal posture. Speech is fluent without dysarthria, dysphonia or aphasia. Mood and affect are a little fatigued- Cranial nerves: metallic taste  Since being on metformin.  Pupils are equal in size.  Funduscopic exam without evidence of pallor or edema.  Extraocular movements  in vertical and horizontal planes intact and without nystagmus. Visual fields by finger perimetry are intact.Hearing to finger rub intact.   Facial sensation intact to fine touch. Facial motor strength is symmetric  and tongue and uvula move midline. Motor exam:  Normal tone ,muscle bulk and symmetric strength in all extremities. Assessment:  After physical and neurologic examination, review of laboratory studies, imaging, neurophysiology testing and pre-existing records, assessment is   Possible spinal stenosis , hyperreflexia in both knees , in spite of diabetes. Weakness when rising , needs to brace herself.  The patient was advised of the nature of the diagnosed sleep disorder , the treatment options and risks for general a health and wellness arising from not treating the condition. Visit duration was 30 minutes.   Plan:  Treatment plan and additional workup :  I will prescribe replacement mask , tubing, and  filter etc.  Her pressure window was elevated 5 cm  to 15 cm water. Her AHI is 2.7 /h - well tolerated.  The patient did describe depressive symptoms, clearly but also no jitteriness a level of baseline nausea that makes her not appreciate the final things in life area sometimes and the metallic taste in the mouth I wonder if this is all related to metformin. She does feel that Zoloft helped a little bit with the depression symptoms but I think that her nausea and in a unrest may be related to metformin. And  this may have also led to some of the weight loss- it is not all depression, she feels.   Rv in 12 month with CPAP, NP.  Her new machine was issued in April 20189.    Asencion Partridge Hester Forget MD  07/17/2018

## 2018-07-25 DIAGNOSIS — M1712 Unilateral primary osteoarthritis, left knee: Secondary | ICD-10-CM | POA: Diagnosis not present

## 2018-07-25 DIAGNOSIS — M1711 Unilateral primary osteoarthritis, right knee: Secondary | ICD-10-CM | POA: Diagnosis not present

## 2018-08-02 DIAGNOSIS — E119 Type 2 diabetes mellitus without complications: Secondary | ICD-10-CM | POA: Diagnosis not present

## 2018-08-14 DIAGNOSIS — H25811 Combined forms of age-related cataract, right eye: Secondary | ICD-10-CM | POA: Diagnosis not present

## 2018-08-21 DIAGNOSIS — G4733 Obstructive sleep apnea (adult) (pediatric): Secondary | ICD-10-CM | POA: Diagnosis not present

## 2018-09-07 DIAGNOSIS — I10 Essential (primary) hypertension: Secondary | ICD-10-CM | POA: Diagnosis not present

## 2018-09-07 DIAGNOSIS — E119 Type 2 diabetes mellitus without complications: Secondary | ICD-10-CM | POA: Diagnosis not present

## 2018-09-07 DIAGNOSIS — J309 Allergic rhinitis, unspecified: Secondary | ICD-10-CM | POA: Diagnosis not present

## 2018-09-07 DIAGNOSIS — E785 Hyperlipidemia, unspecified: Secondary | ICD-10-CM | POA: Diagnosis not present

## 2018-09-08 ENCOUNTER — Other Ambulatory Visit: Payer: Self-pay | Admitting: *Deleted

## 2018-09-08 MED ORDER — OMEPRAZOLE 20 MG PO CPDR: 20.0000 mg | DELAYED_RELEASE_CAPSULE | Freq: Two times a day (BID) | ORAL | 3 refills | Status: DC

## 2018-09-08 NOTE — Progress Notes (Signed)
Omeprazole sent in per fax request from [facility]

## 2018-09-21 DIAGNOSIS — E119 Type 2 diabetes mellitus without complications: Secondary | ICD-10-CM | POA: Diagnosis not present

## 2018-09-25 ENCOUNTER — Telehealth: Payer: Self-pay | Admitting: Gastroenterology

## 2018-09-25 NOTE — Telephone Encounter (Signed)
Patient reports that when in the shower this weekend, patient had a large amount of blood from her rectum.  She reports that she had several episodes of bleeding on her pad, but today only minimal bleeding.  She is very tender.  She will come in and see Nicoletta Ba PA on 09/26/18 8:30

## 2018-09-25 NOTE — Telephone Encounter (Signed)
Patient states she has been having rectal bleeding for a couple days and dark stools. Patient is concerned and wants to speak with a nurse.

## 2018-09-26 ENCOUNTER — Encounter: Payer: Self-pay | Admitting: Physician Assistant

## 2018-09-26 ENCOUNTER — Ambulatory Visit (INDEPENDENT_AMBULATORY_CARE_PROVIDER_SITE_OTHER): Payer: Medicare Other | Admitting: Physician Assistant

## 2018-09-26 VITALS — BP 136/72 | HR 88 | Ht 61.5 in | Wt 140.3 lb

## 2018-09-26 DIAGNOSIS — K623 Rectal prolapse: Secondary | ICD-10-CM

## 2018-09-26 DIAGNOSIS — K625 Hemorrhage of anus and rectum: Secondary | ICD-10-CM | POA: Diagnosis not present

## 2018-09-26 MED ORDER — HYDROCORTISONE 1 % EX OINT: 1.0000 "application " | TOPICAL_OINTMENT | Freq: Three times a day (TID) | CUTANEOUS | 0 refills | Status: DC

## 2018-09-26 NOTE — Progress Notes (Signed)
Subjective:    Patient ID: Kristen Mahoney, female    DOB: 1946/03/28, 72 y.o.   MRN: [id number]  HPI Keyana is a pleasant 72 year old African-American female known to Dr. Silverio Decamp who comes in today with complaints of rectal bleeding and recent dark stool She also complains of rectal pressure.  She was last seen in our office last summer with complaints of rectal bleeding.   She had colonoscopy in October 2018 which showed an incompetent sphincter, there is a small rectal vault, with partial rectal prolapse and was noted to have a few diverticuli. Patient states that she had one surgery about 10 years ago for a vaginal fistula This procedure was done at [facility], and resulted in an incompetent sphincter. She says that she was evaluated by a surgeon here in [place], she believes Leighton Ruff for 5 years ago who had discussed a procedure involving mesh and she decided she did not want to proceed with this. She says she recently had gone for months without any problems occasionally just seeing small specks of blood. This past weekend she was in the shower she says she started dripping bright red blood from her anus.  She says it came out like water seemed like a lot of blood and scared her.  She had to wear a pad with some tissue that day and says she had soaking through the pad a couple of times but the bleeding eventually resolved.  She had also taken some Pepto-Bismol and Imodium her to that as she tends towards loose stools.  She says her bowel movements were dark after that he has not had any more gross bleeding. She describes a rectal pressure over the past couple of days but no complaints of abdominal pain.  Review of Systems Pertinent positive and negative review of systems were noted in the above HPI section.  All other review of systems was otherwise negative.  Outpatient Encounter Medications as of 09/26/2018  Medication Sig  . acetaminophen (TYLENOL) 325 MG tablet Take 650 mg by mouth as needed.   [name] Kitchen aspirin EC 81 MG tablet Take 81 mg by mouth every evening.   [name] Kitchen atorvastatin (LIPITOR) 20 MG tablet Take 20 mg by mouth daily.   [name] Kitchen bismuth subsalicylate (PEPTO BISMOL) 262 MG/15ML suspension Take 30 mLs by mouth every 6 (six) hours as needed for indigestion.  [name] Kitchen buPROPion (ZYBAN) 150 MG 12 hr tablet Take 150 mg by mouth daily. Start after finishing fluoxetine  . Dextromethorphan-guaiFENesin ([place] DM PO) Take by mouth as needed.  . fexofenadine (ALLEGRA) 180 MG tablet Take 180 mg by mouth daily.  . hyoscyamine (LEVSIN SL) 0.125 MG SL tablet DISSOLVE 1 TABLET IN MOUTH EVERY 6 HOURS AS NEEDED FOR  CRAMPING  . metFORMIN (GLUCOPHAGE) 500 MG tablet Take 1,000 mg by mouth 2 (two) times daily with a meal.   . metoprolol succinate (TOPROL XL) 50 MG 24 hr tablet Take 1 tablet (50 mg total) by mouth daily. Take with or immediately following a meal.  . Multiple Vitamins-Minerals (ICAPS) CAPS Take by mouth.  [name] Kitchen omeprazole (PRILOSEC) 20 MG capsule Take 1 capsule (20 mg total) by mouth 2 (two) times daily before a meal.  . Vitamin D, Cholecalciferol, 400 units TABS Take by mouth.  . vitamin E (VITAMIN E) 400 UNIT capsule Take 400 Units by mouth daily.  . hydrocortisone 1 % ointment Apply 1 application topically 3 (three) times daily.   Facility-Administered Encounter Medications as of 09/26/2018  Medication  . 0.9 %  sodium chloride infusion  . betamethasone acetate-betamethasone sodium phosphate (CELESTONE) injection 3 mg   Allergies  Allergen Reactions  . Ciprofloxacin Rash  . Morphine And Related Itching   Patient Active Problem List   Diagnosis Date Noted  . Weight loss 08/23/2017  . Rectal pain 08/23/2017  . Rectal bleeding 08/23/2017  . Incontinence of feces 07/14/2017  . HTN (hypertension), malignant 07/14/2017  . Anxiety and depression 07/14/2017  . Weight loss, unintentional 07/14/2017  . Gas pain 06/17/2017  . Bloating 06/17/2017  . 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 OSA on CPAP  07/13/2016  . Trichomonas vaginitis 08/12/2015  . Kidney stone   . DIARRHEA, INFECTIOUS 09/04/2009  . BLOOD IN STOOL-MELENA 09/04/2009  . Abdominal pain, left lower quadrant 09/04/2009  . BLOOD IN STOOL, OCCULT 09/04/2009  . HYPERLIPIDEMIA 12/25/2008  . ANXIETY 12/25/2008  . Reactive depression 12/25/2008  . GERD 12/25/2008  . HIATAL HERNIA 12/25/2008  . Diverticulosis of colon (without mention of hemorrhage) 12/25/2008  . IRRITABLE BOWEL SYNDROME 12/25/2008  . RECTOVAGINAL FISTULA 12/25/2008  . MITRAL VALVE PROLAPSE, HX OF 12/25/2008   Social History   Socioeconomic History  . Marital status: Married    Spouse name: Not on file  . Number of children: 2  . Years of education: Not on file  . Highest education level: Not on file  Occupational History  . Occupation: Retired    Fish farm manager: RETIRED  Social Needs  . Financial resource strain: Not on file  . Food insecurity:    Worry: Not on file    Inability: Not on file  . Transportation needs:    Medical: Not on file    Non-medical: Not on file  Tobacco Use  . Smoking status: Former Research scientist (life sciences)  . Smokeless tobacco: Never Used  Substance and Sexual Activity  . Alcohol use: Yes    Comment: occasional Rare  . Drug use: No  . Sexual activity: Yes    Birth control/protection: Surgical  Lifestyle  . Physical activity:    Days per week: Not on file    Minutes per session: Not on file  . Stress: Not on file  Relationships  . Social connections:    Talks on phone: Not on file    Gets together: Not on file    Attends religious service: Not on file    Active member of club or organization: Not on file    Attends meetings of clubs or organizations: Not on file    Relationship status: Not on file  . Intimate partner violence:    Fear of current or ex partner: Not on file    Emotionally abused: Not on file    Physically abused: Not on file    Forced sexual activity: Not on file  Other Topics Concern  . Not on file  Social  History Narrative   Denies caffeine use.    Ms. Krolikowski family history includes Breast cancer in her paternal aunt; Colon cancer in her father; Colon polyps in her brother, brother, and sister; Diabetes in her brother, father, mother, sister, and sister; Heart disease in her brother, father, and sister; Hyperlipidemia in her brother; Hypertension in her father, sister, and sister; Prostate cancer in her brother; Stroke in her brother.      Objective:    Vitals:   09/26/18 0833  BP: 136/72  Pulse: 88    Physical Exam; well-developed older African-American female in no acute distress, quite pleasant blood pressure 136/72 pulse 82, height 5 foot 1, weight  140, BMI 26.  HEENT; nontraumatic normocephalic EOMI PERRLA sclera anicteric oral mucosa moist, Cardiovascular; regular rate and rhythm with S1-S2 no murmur rub gallop, Pulmonary ;clear bilaterally, Abdomen ;soft, she has several incisional scars,, very mild tenderness in the deep left lower quadrant, there is no palpable mass or hepatosplenomegaly Rectal ;exam shows partial rectal prolapse with the rectum just at the anus, she has an incompetent anal sphincter with significant laxity.  I am able to reduce the prolapse.  The tissue appears friable but no obvious ulceration Extremities ;no clubbing cyanosis or edema skin warm and dry, Neuro psych; alert and oriented, grossly nonfocal mood and affect appropriate       Assessment & Plan:   #67 72 year old African-American female with an episode of hematochezia about 3 days ago she resolved that same day. Patient has been feeling increased rectal pressure since. On exam she has obvious partial rectal prolapse with friable rectal tissue.  The prolapse is reducible however she has an incompetent sphincter.  2. diverticulosis 3.  Colon cancer surveillance-up-to-date with colonoscopy done October 2008 no polyps 4.  GERD 5.  IBS 6.  Status post previous repair of rectovaginal fistula 7.   Obstructive sleep apnea on CPAP  Plan; Will obtain Dr. Rudean Hitt previous consultation note. Patient is not interested in pursuing surgery at this time. We discussed how to gently reduce the prolapse if she starts feeling pressure Will add Benefiber 1 scoop daily in a glass of water to bulk her stool given her incompetent sphincter Also give her a prescription for hydrocortisone 1% to apply 2-3 times daily as needed for irritation. Check CBC She will follow-up with Dr. Rush Landmark or myself as needed. Yuchen Fedor S Kyarah Enamorado PA-C 09/26/2018   Cc: [name], MD

## 2018-09-26 NOTE — Patient Instructions (Signed)
Use Benefiber 1 scoop daily in 6 oz of juice or water.   Try Preparation H cream rectally 3-4 daily as needed.   We have sent the following medications to your pharmacy for you to pick up at your convenience: Hydrocortisone cream 1% three times a day for anal irritation.

## 2018-10-01 NOTE — Progress Notes (Signed)
Reviewed and agree with documentation and assessment and plan. K. Veena Ashaun Gaughan , MD   

## 2018-10-18 ENCOUNTER — Ambulatory Visit (INDEPENDENT_AMBULATORY_CARE_PROVIDER_SITE_OTHER): Payer: Medicare Other | Admitting: Women's Health

## 2018-10-18 ENCOUNTER — Encounter: Payer: Self-pay | Admitting: Women's Health

## 2018-10-18 VITALS — BP 132/80

## 2018-10-18 DIAGNOSIS — A599 Trichomoniasis, unspecified: Secondary | ICD-10-CM

## 2018-10-18 DIAGNOSIS — R350 Frequency of micturition: Secondary | ICD-10-CM

## 2018-10-18 LAB — WET PREP FOR TRICH, YEAST, CLUE

## 2018-10-18 MED ORDER — SULFAMETHOXAZOLE-TRIMETHOPRIM 800-160 MG PO TABS: 1.0000 | ORAL_TABLET | Freq: Two times a day (BID) | ORAL | 0 refills | Status: DC

## 2018-10-18 MED ORDER — METRONIDAZOLE 500 MG PO TABS: ORAL_TABLET | ORAL | 1 refills | Status: DC

## 2018-10-18 MED ORDER — VALACYCLOVIR HCL 500 MG PO TABS: ORAL_TABLET | ORAL | 12 refills | Status: DC

## 2018-10-18 NOTE — Progress Notes (Signed)
72 year old MPF G3, P2 presents with complaint of vaginal discharge with odor, pain/burning with urination, pain at end of stream of urination and low back pain for the past several days.  Denies fever, nausea or changes in GI elimination.  Medical problems include diabetes, hypertension, hypercholesteremia primary care manages.  Has had recurrent trichomonas in the past with negative STD screens.  TAH on no HRT.  Exam: Appears well, tearful when reviewing trichomonas.  No CVAT discomfort is low sacral area, abdomen soft nontender no rebound or radiation, external genitalia erythematous at introitus, speculum exam moderate amount of a yellow adherent malodorous discharge positive for trichomonas, clue cells TNTC bacteria and TNTC WBCs.  GC/Chlamydia culture from  urinary meatus UA: +1 blood, trace protein, +3 leukocytes, greater than 60 WBCs, 10-20 RBCs, 10-20 squamous epithelials, many bacteria and a few trichomonas  Trichomonas UTI with hematuria  Plan:.  Septra twice daily for 3 days prescription, proper use given and reviewed.  Urine culture pending.  Flagyl 500 mg 4 tablets for both she and husband.  Alcohol precautions reviewed.  Prescription, proper use given.  Reviewed importance of having his partner treated also.  Reviewed abstinence, or condom use when active.  Repeat UA in 1 month.

## 2018-10-19 LAB — C. TRACHOMATIS/N. GONORRHOEAE RNA
C. TRACHOMATIS RNA, TMA: NOT DETECTED
N. gonorrhoeae RNA, TMA: NOT DETECTED

## 2018-10-20 ENCOUNTER — Telehealth: Payer: Self-pay | Admitting: *Deleted

## 2018-10-20 LAB — URINALYSIS, COMPLETE W/RFL CULTURE
Bilirubin Urine: NEGATIVE
GLUCOSE, UA: NEGATIVE
Hyaline Cast: NONE SEEN /LPF
KETONES UR: NEGATIVE
NITRITES URINE, INITIAL: NEGATIVE
Specific Gravity, Urine: 1.025 (ref 1.001–1.03)
WBC, UA: >=60 /HPF — AB (ref 0–5)
pH: 5.5 (ref 5.0–8.0)

## 2018-10-20 LAB — URINE CULTURE
MICRO NUMBER:: 91278324
SPECIMEN QUALITY:: ADEQUATE

## 2018-10-20 LAB — CULTURE INDICATED

## 2018-10-20 MED ORDER — VALACYCLOVIR HCL 500 MG PO TABS: ORAL_TABLET | ORAL | 12 refills | Status: DC

## 2018-10-20 NOTE — Telephone Encounter (Signed)
[facility] sent fax wanting clarify directions for Valtrex 500 mg tablet, directions fixed and Rx sent.

## 2018-10-31 DIAGNOSIS — E119 Type 2 diabetes mellitus without complications: Secondary | ICD-10-CM | POA: Diagnosis not present

## 2018-10-31 DIAGNOSIS — I10 Essential (primary) hypertension: Secondary | ICD-10-CM | POA: Diagnosis not present

## 2018-11-02 DIAGNOSIS — K219 Gastro-esophageal reflux disease without esophagitis: Secondary | ICD-10-CM | POA: Diagnosis not present

## 2018-11-02 DIAGNOSIS — I10 Essential (primary) hypertension: Secondary | ICD-10-CM | POA: Diagnosis not present

## 2018-11-02 DIAGNOSIS — F419 Anxiety disorder, unspecified: Secondary | ICD-10-CM | POA: Diagnosis not present

## 2018-11-02 DIAGNOSIS — E119 Type 2 diabetes mellitus without complications: Secondary | ICD-10-CM | POA: Diagnosis not present

## 2018-11-03 DIAGNOSIS — M1711 Unilateral primary osteoarthritis, right knee: Secondary | ICD-10-CM | POA: Diagnosis not present

## 2018-11-03 DIAGNOSIS — M1712 Unilateral primary osteoarthritis, left knee: Secondary | ICD-10-CM | POA: Diagnosis not present

## 2018-11-21 DIAGNOSIS — G4733 Obstructive sleep apnea (adult) (pediatric): Secondary | ICD-10-CM | POA: Diagnosis not present

## 2018-12-13 DIAGNOSIS — E119 Type 2 diabetes mellitus without complications: Secondary | ICD-10-CM | POA: Diagnosis not present

## 2018-12-13 DIAGNOSIS — E785 Hyperlipidemia, unspecified: Secondary | ICD-10-CM | POA: Diagnosis not present

## 2018-12-13 DIAGNOSIS — I10 Essential (primary) hypertension: Secondary | ICD-10-CM | POA: Diagnosis not present

## 2018-12-13 DIAGNOSIS — J309 Allergic rhinitis, unspecified: Secondary | ICD-10-CM | POA: Diagnosis not present

## 2018-12-15 DIAGNOSIS — E119 Type 2 diabetes mellitus without complications: Secondary | ICD-10-CM | POA: Diagnosis not present

## 2018-12-25 DIAGNOSIS — E785 Hyperlipidemia, unspecified: Secondary | ICD-10-CM | POA: Diagnosis not present

## 2018-12-25 DIAGNOSIS — Z79899 Other long term (current) drug therapy: Secondary | ICD-10-CM | POA: Diagnosis not present

## 2018-12-25 DIAGNOSIS — E119 Type 2 diabetes mellitus without complications: Secondary | ICD-10-CM | POA: Diagnosis not present

## 2018-12-25 DIAGNOSIS — I10 Essential (primary) hypertension: Secondary | ICD-10-CM | POA: Diagnosis not present

## 2019-01-16 ENCOUNTER — Telehealth: Payer: Self-pay | Admitting: Gastroenterology

## 2019-01-16 NOTE — Telephone Encounter (Signed)
Per last EGD report pt was to continue taking PPI  I cant find anything saying to stop it from [place]

## 2019-01-16 NOTE — Telephone Encounter (Signed)
Spoke with patient and explained to her I do not see where she is to discontinue omeprazole

## 2019-01-31 ENCOUNTER — Ambulatory Visit (INDEPENDENT_AMBULATORY_CARE_PROVIDER_SITE_OTHER): Payer: Medicare Other | Admitting: Women's Health

## 2019-01-31 ENCOUNTER — Encounter: Payer: Self-pay | Admitting: Women's Health

## 2019-01-31 VITALS — BP 120/82

## 2019-01-31 DIAGNOSIS — A599 Trichomoniasis, unspecified: Secondary | ICD-10-CM

## 2019-01-31 DIAGNOSIS — B9689 Other specified bacterial agents as the cause of diseases classified elsewhere: Secondary | ICD-10-CM

## 2019-01-31 DIAGNOSIS — N760 Acute vaginitis: Secondary | ICD-10-CM | POA: Diagnosis not present

## 2019-01-31 LAB — WET PREP FOR TRICH, YEAST, CLUE

## 2019-01-31 MED ORDER — METRONIDAZOLE 500 MG PO TABS: ORAL_TABLET | ORAL | 0 refills | Status: DC

## 2019-01-31 NOTE — Progress Notes (Signed)
73 year old MBF G3 P2 presents with complaint of low abdominal cramping and vaginal discharge for the past week.  Has had some loose stools, history of IBS.  History of trichomonas has used condoms for the most part but did have intercourse 2 weeks ago without a condom with husband.  Husband assures her of fidelity although she has had trichomonas several times.  Denies urinary symptoms, back pain, or fever.  Negative  GC/chlamydia, HIV, hep B, C, RPR.[name] Kitchen Primary care manages hypertension, diabetes, hypercholesteremia.  TAH on no HRT.  Exam: Appears well.  No CVAT.  Abdomen soft without rebound or radiation, slight tenderness in left lower quadrant.  External genitalia within normal limits, speculum exam moderate amount of but adherent discharge with odor noted, wet prep positive for trichomonas, clues, TNTC bacteria.  Bimanual no adnexal tenderness.  Trichomonas infection  Plan: Flagyl 2 g p.o. for both she and husband, alcohol precautions , reviewed importance of condoms consistently or abstinence.  Instructed to call if continued low abdominal cramping.

## 2019-01-31 NOTE — Patient Instructions (Signed)
Trichomoniasis Trichomoniasis is an STI (sexually transmitted infection) that can affect both women and men. In women, the outer area of the female genitalia (vulva) and the vagina are affected. In men, the penis is mainly affected, but the prostate and other reproductive organs can also be involved. This condition can be treated with medicine. It often has no symptoms (is asymptomatic), especially in men. What are the causes? This condition is caused by an organism called Trichomonas vaginalis. Trichomoniasis most often spreads from person to person (is contagious) through sexual contact. What increases the risk? The following factors may make you more likely to develop this condition:  Having unprotected sexual intercourse.  Having sexual intercourse with a partner who has trichomoniasis.  Having multiple sexual partners.  Having had previous trichomoniasis infections or other STIs. What are the signs or symptoms? In women, symptoms of trichomoniasis include:  Abnormal vaginal discharge that is clear, white, gray, or yellow-green and foamy and has an unusual "fishy" odor.  Itching and irritation of the vagina and vulva.  Burning or pain during urination or sexual intercourse.  Genital redness and swelling. In men, symptoms of trichomoniasis include:  Penile discharge that may be foamy or contain pus.  Pain in the penis. This may happen only when urinating.  Itching or irritation inside the penis.  Burning after urination or ejaculation. How is this diagnosed? In women, this condition may be found during a routine Pap test or physical exam. It may be found in men during a routine physical exam. Your health care provider may perform tests to help diagnose this infection, such as:  Urine tests (men and women).  The following in women: ? Testing the pH of the vagina. ? A vaginal swab test that checks for the Trichomonas vaginalis organism. ? Testing vaginal secretions. Your  health care provider may test you for other STIs, including HIV (human immunodeficiency virus). How is this treated? This condition is treated with medicine taken by mouth (orally), such as metronidazole or tinidazole to fight the infection. Your sexual partner(s) may also need to be tested and treated.  If you are a woman and you plan to become pregnant or think you may be pregnant, tell your health care provider right away. Some medicines that are used to treat the infection should not be taken during pregnancy. Your health care provider may recommend over-the-counter medicines or creams to help relieve itching or irritation. You may be tested for infection again 3 months after treatment. Follow these instructions at home:  Take and use over-the-counter and prescription medicines, including creams, only as told by your health care provider.  Do not have sexual intercourse until one week after you finish your medicine, or until your health care provider approves. Ask your health care provider when you may resume sexual intercourse.  (Women) Do not douche or wear tampons while you have the infection.  Discuss your infection with your sexual partner(s). Make sure that your partner gets tested and treated, if necessary.  Keep all follow-up visits as told by your health care provider. This is important. How is this prevented?  Use condoms every time you have sex. Using condoms correctly and consistently can help protect against STIs.  Avoid having multiple sexual partners.  Talk with your sexual partner about any symptoms that either of you may have, as well as any history of STIs.  Get tested for STIs and STDs (sexually transmitted diseases) before you have sex. Ask your partner to do the same.    Do not have sexual contact if you have symptoms of trichomoniasis or another STI. Contact a health care provider if:  You still have symptoms after you finish your medicine.  You develop pain in  your abdomen.  You have pain when you urinate.  You have bleeding after sexual intercourse.  You develop a rash.  You feel nauseous or you vomit.  You plan to become pregnant or think you may be pregnant. Summary  Trichomoniasis is an STI (sexually transmitted infection) that can affect both women and men.  This condition often has no symptoms (is asymptomatic), especially in men.  You should not have sexual intercourse until one week after you finish your medicine, or until your health care provider approves. Ask your health care provider when you may resume sexual intercourse.  Discuss your infection with your sexual partner. Make sure that your partner gets tested and treated, if necessary. This information is not intended to replace advice given to you by your health care provider. Make sure you discuss any questions you have with your health care provider. Document Released: 06/08/2001 Document Revised: 11/05/2016 Document Reviewed: 11/05/2016 Elsevier Interactive Patient Education  2019 [employer] Inc[employer]  

## 2019-02-09 DIAGNOSIS — E119 Type 2 diabetes mellitus without complications: Secondary | ICD-10-CM | POA: Diagnosis not present

## 2019-02-22 DIAGNOSIS — G4733 Obstructive sleep apnea (adult) (pediatric): Secondary | ICD-10-CM | POA: Diagnosis not present

## 2019-02-26 ENCOUNTER — Telehealth: Payer: Self-pay | Admitting: Gastroenterology

## 2019-02-26 MED ORDER — HYOSCYAMINE SULFATE 0.125 MG SL SUBL: SUBLINGUAL_TABLET | SUBLINGUAL | 1 refills | Status: DC

## 2019-02-26 NOTE — Telephone Encounter (Signed)
Medication sent to [place] s requested

## 2019-02-26 NOTE — Telephone Encounter (Signed)
Pt needs a refill for hyoscyamine. Sent to [address]

## 2019-03-03 DIAGNOSIS — R1084 Generalized abdominal pain: Secondary | ICD-10-CM | POA: Diagnosis not present

## 2019-03-07 ENCOUNTER — Encounter: Payer: Self-pay | Admitting: Women's Health

## 2019-03-07 ENCOUNTER — Other Ambulatory Visit: Payer: Self-pay

## 2019-03-07 ENCOUNTER — Ambulatory Visit (INDEPENDENT_AMBULATORY_CARE_PROVIDER_SITE_OTHER): Payer: Medicare Other | Admitting: Women's Health

## 2019-03-07 VITALS — BP 140/82 | Ht 62.0 in | Wt 140.4 lb

## 2019-03-07 DIAGNOSIS — Z9289 Personal history of other medical treatment: Secondary | ICD-10-CM | POA: Diagnosis not present

## 2019-03-07 DIAGNOSIS — Z01419 Encounter for gynecological examination (general) (routine) without abnormal findings: Secondary | ICD-10-CM | POA: Diagnosis not present

## 2019-03-07 DIAGNOSIS — I10 Essential (primary) hypertension: Secondary | ICD-10-CM | POA: Diagnosis not present

## 2019-03-07 DIAGNOSIS — R002 Palpitations: Secondary | ICD-10-CM | POA: Diagnosis not present

## 2019-03-07 DIAGNOSIS — B009 Herpesviral infection, unspecified: Secondary | ICD-10-CM

## 2019-03-07 DIAGNOSIS — J309 Allergic rhinitis, unspecified: Secondary | ICD-10-CM | POA: Diagnosis not present

## 2019-03-07 DIAGNOSIS — E119 Type 2 diabetes mellitus without complications: Secondary | ICD-10-CM | POA: Diagnosis not present

## 2019-03-07 MED ORDER — VALACYCLOVIR HCL 500 MG PO TABS: ORAL_TABLET | ORAL | 12 refills | Status: DC

## 2019-03-07 NOTE — Progress Notes (Signed)
Kristen Mahoney 1946/03/05 [id number]    History:    Presents for breast and pelvic exam.  TVH for fibroids on no HRT.  Normal Pap and mammogram history.  Current on vaccines.  History of recurrent trichomonas not sexually active without discharge today.  Normal DEXA 2016+3.6 at spine.  Negative colonoscopy 09/2017.[name] KitchenPrimary care manages hypertension, diabetes, GERD.  Was seen at [facility] walk-in clinic last week for severe abdominal pain that was questionable diverticulitis non elevated white count pain has now dissipated without treatment.  Past medical history, past surgical history, family history and social history were all reviewed and documented in the EPIC chart.  2 children and 2 grandchildren all doing well.  ROS:  A ROS was performed and pertinent positives and negatives are included.  Exam:  Vitals:   03/07/19 1112  BP: 140/82  Weight: 140 lb 6.4 oz (63.7 kg)  Height: 5\' 2"  (1.575 m)   Body mass index is 25.68 kg/m.   General appearance:  Normal Thyroid:  Symmetrical, normal in size, without palpable masses or nodularity. Respiratory  Auscultation:  Clear without wheezing or rhonchi Cardiovascular  Auscultation:  Regular rate, without rubs, murmurs or gallops  Edema/varicosities:  Not grossly evident Abdominal  Soft,nontender, without masses, guarding or rebound.  Liver/spleen:  No organomegaly noted  Hernia:  None appreciated  Skin  Inspection:  Grossly normal   Breasts: Examined lying and sitting.     Right: Without masses, retractions, discharge or axillary adenopathy.     Left: Without masses, retractions, discharge or axillary adenopathy. Gentitourinary   Inguinal/mons:  Normal without inguinal adenopathy  External genitalia:  Normal  BUS/Urethra/Skene's glands:  Normal  Vagina:  Normal  Cervix: And uterus absent adnexa/parametria:     Rt: Without masses or tenderness.   Lt: Without masses or tenderness.  Anus and perineum: Normal  Digital rectal exam: Normal  sphincter tone without palpated masses or tenderness  Assessment/Plan:  73 y.o. MBF G3P2 for breast and pelvic exam with no complaints.  TVH for fibroids no HRT Hypertension, diabetes, GERD-primary care manages HSV 1 rare outbreaks  Plan: Valtrex 500 twice daily for 3 to 5 days as needed prescription, proper use given and reviewed.  SBEs, continue annual screening mammogram due in April will schedule.  Reviewed importance of regular exercise, low calorie/carb diet.  Safety, fall prevention and encouraged to increase balance type exercise such as yoga.  [name] [facility], 11:27 AM 03/07/2019

## 2019-03-07 NOTE — Patient Instructions (Signed)
Call [place] for mammogram  [phone]  Health Maintenance After Age 74 After age 40, you are at a higher risk for certain long-term diseases and infections as well as injuries from falls. Falls are a major cause of broken bones and head injuries in people who are older than age 8. Getting regular preventive care can help to keep you healthy and well. Preventive care includes getting regular testing and making lifestyle changes as recommended by your health care provider. Talk with your health care provider about:  Which screenings and tests you should have. A screening is a test that checks for a disease when you have no symptoms.  A diet and exercise plan that is right for you. What should I know about screenings and tests to prevent falls? Screening and testing are the best ways to find a health problem early. Early diagnosis and treatment give you the best chance of managing medical conditions that are common after age 46. Certain conditions and lifestyle choices may make you more likely to have a fall. Your health care provider may recommend:  Regular vision checks. Poor vision and conditions such as cataracts can make you more likely to have a fall. If you wear glasses, make sure to get your prescription updated if your vision changes.  Medicine review. Work with your health care provider to regularly review all of the medicines you are taking, including over-the-counter medicines. Ask your health care provider about any side effects that may make you more likely to have a fall. Tell your health care provider if any medicines that you take make you feel dizzy or sleepy.  Osteoporosis screening. Osteoporosis is a condition that causes the bones to get weaker. This can make the bones weak and cause them to break more easily.  Blood pressure screening. Blood pressure changes and medicines to control blood pressure can make you feel dizzy.  Strength and balance checks. Your health care provider  may recommend certain tests to check your strength and balance while standing, walking, or changing positions.  Foot health exam. Foot pain and numbness, as well as not wearing proper footwear, can make you more likely to have a fall.  Depression screening. You may be more likely to have a fall if you have a fear of falling, feel emotionally low, or feel unable to do activities that you used to do.  Alcohol use screening. Using too much alcohol can affect your balance and may make you more likely to have a fall. What actions can I take to lower my risk of falls? General instructions  Talk with your health care provider about your risks for falling. Tell your health care provider if: ? You fall. Be sure to tell your health care provider about all falls, even ones that seem minor. ? You feel dizzy, sleepy, or off-balance.  Take over-the-counter and prescription medicines only as told by your health care provider. These include any supplements.  Eat a healthy diet and maintain a healthy weight. A healthy diet includes low-fat dairy products, low-fat (lean) meats, and fiber from whole grains, beans, and lots of fruits and vegetables. Home safety  Remove any tripping hazards, such as rugs, cords, and clutter.  Install safety equipment such as grab bars in bathrooms and safety rails on stairs.  Keep rooms and walkways well-lit. Activity   Follow a regular exercise program to stay fit. This will help you maintain your balance. Ask your health care provider what types of exercise are appropriate for you.  If you need a cane or walker, use it as recommended by your health care provider.  Wear supportive shoes that have nonskid soles. Lifestyle  Do not drink alcohol if your health care provider tells you not to drink.  If you drink alcohol, limit how much you have: ? 0-1 drink a day for women. ? 0-2 drinks a day for men.  Be aware of how much alcohol is in your drink. In the [place] one  drink equals one typical bottle of beer (12 oz), one-half glass of wine (5 oz), or one shot of hard liquor (1 oz).  Do not use any products that contain nicotine or tobacco, such as cigarettes and e-cigarettes. If you need help quitting, ask your health care provider. Summary  Having a healthy lifestyle and getting preventive care can help to protect your health and wellness after age 62.  Screening and testing are the best way to find a health problem early and help you avoid having a fall. Early diagnosis and treatment give you the best chance for managing medical conditions that are more common for people who are older than age 39.  Falls are a major cause of broken bones and head injuries in people who are older than age 81. Take precautions to prevent a fall at home.  Work with your health care provider to learn what changes you can make to improve your health and wellness and to prevent falls. This information is not intended to replace advice given to you by your health care provider. Make sure you discuss any questions you have with your health care provider. Document Released: 10/26/2017 Document Revised: 10/26/2017 Document Reviewed: 10/26/2017 Elsevier Interactive Patient Education  2019 [employer].

## 2019-03-12 ENCOUNTER — Telehealth: Payer: Self-pay | Admitting: Gastroenterology

## 2019-03-12 NOTE — Telephone Encounter (Signed)
Spoke with the patient. She is having LLQ pain that she describes as "scissors slowly cutting." Her pain wraps over to the RLQ pain sometimes. She has loose stool on some days and feels constipated on others. She is taking daily Omeprazole. She is taking PRN Levsin but that has been more frequent lately. She feels nauseated. Afebrile. No blood with stools.  She was seen at the [facility] a week ago. No antibiotics or other treatment given at that visit. Please advise.

## 2019-03-12 NOTE — Telephone Encounter (Signed)
Pt called to inform that she has been having stomach cramping since a while ago, she just rescheduled her appt with Amy that was scheduled on 3/20 because she will be attending a funeral on that day. She wants to know if she can be prescribed something in the meantime.

## 2019-03-13 NOTE — Telephone Encounter (Signed)
Patient is advised. She will try these recommendations and call back if she fails to improve.

## 2019-03-13 NOTE — Telephone Encounter (Signed)
She has h/o chronic IBS, weak sphincter and intermittent fecal incontinence. Lower abdominal pain ?musculoskeletal Please advise patient to avoid taking Imodium if she is taking any.  Benefiber 1-2 tablespoons TID with meals.  IB gard 1 capsule TID PRN If continues to have persistent pain , consider CT abd & pelvis

## 2019-03-16 ENCOUNTER — Ambulatory Visit: Payer: Medicare Other | Admitting: Physician Assistant

## 2019-03-20 ENCOUNTER — Telehealth: Payer: Self-pay

## 2019-03-20 ENCOUNTER — Other Ambulatory Visit: Payer: Self-pay

## 2019-03-20 DIAGNOSIS — R1032 Left lower quadrant pain: Secondary | ICD-10-CM

## 2019-03-20 NOTE — Telephone Encounter (Signed)
-----   Message from Alfredia Ferguson, PA-C sent at 03/20/2019 11:09 AM EDT ----- Regarding: cancel office appt and schedule for CT abd/pelvis Beth and Dr Silverio Decamp - this pt is on my schedule for Thursday -per notes -she has been having persistent LLQ pain- and plan was for  CT abd/pelvis with contrast if sxs  continuing - she may have been on Nandigam schedule 3/20  , not seen.  I think next step is to schedule for Ct Abd/pelvis with contrast this week , and take her off my schedule for phone visit this week  We can determine plan pending CT findings   Please put order  under Dr Silverio Decamp  Thanks

## 2019-03-20 NOTE — Telephone Encounter (Signed)
Left message to call back and ask to speak with Beth.

## 2019-03-21 ENCOUNTER — Other Ambulatory Visit (INDEPENDENT_AMBULATORY_CARE_PROVIDER_SITE_OTHER): Payer: Medicare Other

## 2019-03-21 ENCOUNTER — Other Ambulatory Visit: Payer: Self-pay

## 2019-03-21 DIAGNOSIS — R1032 Left lower quadrant pain: Secondary | ICD-10-CM

## 2019-03-21 LAB — BASIC METABOLIC PANEL
BUN: 21 mg/dL (ref 6–23)
CO2: 31 mEq/L (ref 19–32)
Calcium: 9.2 mg/dL (ref 8.4–10.5)
Chloride: 106 mEq/L (ref 96–112)
Creatinine, Ser: 0.96 mg/dL (ref 0.40–1.20)
GFR: 68.88 mL/min (ref >60.00)
Glucose, Bld: 96 mg/dL (ref 70–99)
POTASSIUM: 3.8 meq/L (ref 3.5–5.1)
Sodium: 145 mEq/L (ref 135–145)

## 2019-03-21 NOTE — Telephone Encounter (Signed)
Patient contacted. She is in agreement with this plan. She has been to an urgent care of the [facility] practice. They suspected diverticulitis but did not treat. She will come today for kidney function labs. CT tomorrow at [facility] CT. She will come to the office for her instructions.

## 2019-03-22 ENCOUNTER — Other Ambulatory Visit: Payer: Self-pay

## 2019-03-22 ENCOUNTER — Inpatient Hospital Stay: Admission: RE | Admit: 2019-03-22 | Payer: Medicare Other | Source: Ambulatory Visit

## 2019-03-22 ENCOUNTER — Ambulatory Visit: Payer: Medicare Other | Admitting: Physician Assistant

## 2019-03-22 ENCOUNTER — Ambulatory Visit (INDEPENDENT_AMBULATORY_CARE_PROVIDER_SITE_OTHER)
Admission: RE | Admit: 2019-03-22 | Discharge: 2019-03-22 | Disposition: A | Discharge status: Home / Self Care | Payer: Medicare Other | Source: Ambulatory Visit | Attending: Physician Assistant | Admitting: Physician Assistant

## 2019-03-22 DIAGNOSIS — R1032 Left lower quadrant pain: Secondary | ICD-10-CM

## 2019-03-22 MED ORDER — IOHEXOL 300 MG/ML  SOLN
100.0000 mL | Freq: Once | INTRAMUSCULAR | Status: AC | PRN
  Administered 2019-03-22: 100 mL via INTRAVENOUS

## 2019-04-02 DIAGNOSIS — E119 Type 2 diabetes mellitus without complications: Secondary | ICD-10-CM | POA: Diagnosis not present

## 2019-04-12 DIAGNOSIS — M1711 Unilateral primary osteoarthritis, right knee: Secondary | ICD-10-CM | POA: Diagnosis not present

## 2019-04-12 DIAGNOSIS — M1712 Unilateral primary osteoarthritis, left knee: Secondary | ICD-10-CM | POA: Diagnosis not present

## 2019-05-07 DIAGNOSIS — K219 Gastro-esophageal reflux disease without esophagitis: Secondary | ICD-10-CM | POA: Diagnosis not present

## 2019-05-07 DIAGNOSIS — E119 Type 2 diabetes mellitus without complications: Secondary | ICD-10-CM | POA: Diagnosis not present

## 2019-05-07 DIAGNOSIS — I10 Essential (primary) hypertension: Secondary | ICD-10-CM | POA: Diagnosis not present

## 2019-05-07 DIAGNOSIS — F419 Anxiety disorder, unspecified: Secondary | ICD-10-CM | POA: Diagnosis not present

## 2019-05-17 DIAGNOSIS — M1712 Unilateral primary osteoarthritis, left knee: Secondary | ICD-10-CM | POA: Diagnosis not present

## 2019-05-17 DIAGNOSIS — M1711 Unilateral primary osteoarthritis, right knee: Secondary | ICD-10-CM | POA: Diagnosis not present

## 2019-05-24 DIAGNOSIS — M1711 Unilateral primary osteoarthritis, right knee: Secondary | ICD-10-CM | POA: Diagnosis not present

## 2019-05-24 DIAGNOSIS — M1712 Unilateral primary osteoarthritis, left knee: Secondary | ICD-10-CM | POA: Diagnosis not present

## 2019-05-24 DIAGNOSIS — G4733 Obstructive sleep apnea (adult) (pediatric): Secondary | ICD-10-CM | POA: Diagnosis not present

## 2019-06-05 DIAGNOSIS — M1711 Unilateral primary osteoarthritis, right knee: Secondary | ICD-10-CM | POA: Diagnosis not present

## 2019-06-05 DIAGNOSIS — M1712 Unilateral primary osteoarthritis, left knee: Secondary | ICD-10-CM | POA: Diagnosis not present

## 2019-06-06 DIAGNOSIS — J309 Allergic rhinitis, unspecified: Secondary | ICD-10-CM | POA: Diagnosis not present

## 2019-06-06 DIAGNOSIS — E785 Hyperlipidemia, unspecified: Secondary | ICD-10-CM | POA: Diagnosis not present

## 2019-06-06 DIAGNOSIS — I10 Essential (primary) hypertension: Secondary | ICD-10-CM | POA: Diagnosis not present

## 2019-06-06 DIAGNOSIS — M1990 Unspecified osteoarthritis, unspecified site: Secondary | ICD-10-CM | POA: Diagnosis not present

## 2019-06-12 DIAGNOSIS — M1712 Unilateral primary osteoarthritis, left knee: Secondary | ICD-10-CM | POA: Diagnosis not present

## 2019-06-12 DIAGNOSIS — M1711 Unilateral primary osteoarthritis, right knee: Secondary | ICD-10-CM | POA: Diagnosis not present

## 2019-06-19 DIAGNOSIS — W57XXXA Bitten or stung by nonvenomous insect and other nonvenomous arthropods, initial encounter: Secondary | ICD-10-CM | POA: Diagnosis not present

## 2019-06-19 DIAGNOSIS — Z6825 Body mass index (BMI) 25.0-25.9, adult: Secondary | ICD-10-CM | POA: Diagnosis not present

## 2019-06-19 DIAGNOSIS — S80862A Insect bite (nonvenomous), left lower leg, initial encounter: Secondary | ICD-10-CM | POA: Diagnosis not present

## 2019-06-28 DIAGNOSIS — H25812 Combined forms of age-related cataract, left eye: Secondary | ICD-10-CM | POA: Diagnosis not present

## 2019-06-28 DIAGNOSIS — H16223 Keratoconjunctivitis sicca, not specified as Sjogren's, bilateral: Secondary | ICD-10-CM | POA: Diagnosis not present

## 2019-06-28 DIAGNOSIS — H47323 Drusen of optic disc, bilateral: Secondary | ICD-10-CM | POA: Diagnosis not present

## 2019-06-28 DIAGNOSIS — E119 Type 2 diabetes mellitus without complications: Secondary | ICD-10-CM | POA: Diagnosis not present

## 2019-07-02 ENCOUNTER — Encounter: Payer: Self-pay | Admitting: Women's Health

## 2019-07-03 DIAGNOSIS — W57XXXA Bitten or stung by nonvenomous insect and other nonvenomous arthropods, initial encounter: Secondary | ICD-10-CM | POA: Diagnosis not present

## 2019-07-03 DIAGNOSIS — S80862A Insect bite (nonvenomous), left lower leg, initial encounter: Secondary | ICD-10-CM | POA: Diagnosis not present

## 2019-07-03 DIAGNOSIS — S20479A Other superficial bite of unspecified back wall of thorax, initial encounter: Secondary | ICD-10-CM | POA: Diagnosis not present

## 2019-07-10 ENCOUNTER — Telehealth: Payer: Self-pay | Admitting: Neurology

## 2019-07-10 NOTE — Telephone Encounter (Signed)
Called the patient back and advised that Dr Brett Fairy and I have not heard of CPAP causing cavities. CPAP may cause dry mouth which can cause cavities. Advised the patient that she can try a mouthrinse over the counter. She states that she has been using that at bedtime and has not been helping. We address humidification and if patient is telling me correctly the humidification is already set at 5 which is highest setting. Advised the patient to add a couple ice cubes into the water chamber and see if that helps decrease the dry mouth. Pt verbalized understanding. Advised to increase the amount of time she is using the mouth rinse and advised about xylitol gum as well to help with dry mouth. Patient will try all these suggestions. Pt verbalized understanding and was appreciative.

## 2019-07-10 NOTE — Telephone Encounter (Signed)
Pt called wanting RN or Provider to call her back to discuss her Cpap machine. Pt states that she went to her dentist appt and her dentist seems to think that the pt is getting cavities due to the cpap machine. Please advise.

## 2019-07-16 DIAGNOSIS — M47816 Spondylosis without myelopathy or radiculopathy, lumbar region: Secondary | ICD-10-CM | POA: Diagnosis not present

## 2019-07-18 DIAGNOSIS — E119 Type 2 diabetes mellitus without complications: Secondary | ICD-10-CM | POA: Diagnosis not present

## 2019-07-23 ENCOUNTER — Telehealth: Payer: Self-pay | Admitting: Neurology

## 2019-07-23 ENCOUNTER — Ambulatory Visit: Payer: Medicare Other | Admitting: Neurology

## 2019-07-23 NOTE — Telephone Encounter (Signed)
Patient was no show to follow up visit today

## 2019-07-24 ENCOUNTER — Encounter: Payer: Self-pay | Admitting: Neurology

## 2019-07-26 DIAGNOSIS — M47816 Spondylosis without myelopathy or radiculopathy, lumbar region: Secondary | ICD-10-CM | POA: Diagnosis not present

## 2019-08-09 DIAGNOSIS — I10 Essential (primary) hypertension: Secondary | ICD-10-CM | POA: Diagnosis not present

## 2019-08-09 DIAGNOSIS — Z01818 Encounter for other preprocedural examination: Secondary | ICD-10-CM | POA: Diagnosis not present

## 2019-08-09 DIAGNOSIS — E119 Type 2 diabetes mellitus without complications: Secondary | ICD-10-CM | POA: Diagnosis not present

## 2019-08-10 DIAGNOSIS — E119 Type 2 diabetes mellitus without complications: Secondary | ICD-10-CM | POA: Diagnosis not present

## 2019-08-10 DIAGNOSIS — Z01818 Encounter for other preprocedural examination: Secondary | ICD-10-CM | POA: Diagnosis not present

## 2019-08-10 DIAGNOSIS — I10 Essential (primary) hypertension: Secondary | ICD-10-CM | POA: Diagnosis not present

## 2019-08-21 ENCOUNTER — Other Ambulatory Visit: Payer: Self-pay | Admitting: Orthopedic Surgery

## 2019-08-24 DIAGNOSIS — G4733 Obstructive sleep apnea (adult) (pediatric): Secondary | ICD-10-CM | POA: Diagnosis not present

## 2019-09-07 ENCOUNTER — Emergency Department (HOSPITAL_COMMUNITY)
Admission: EM | Admit: 2019-09-07 | Discharge: 2019-09-07 | Disposition: A | Discharge status: Home / Self Care | Payer: Medicare Other | Attending: Emergency Medicine | Admitting: Emergency Medicine

## 2019-09-07 ENCOUNTER — Encounter (HOSPITAL_COMMUNITY): Payer: Self-pay | Admitting: Family Medicine

## 2019-09-07 ENCOUNTER — Emergency Department (HOSPITAL_COMMUNITY): Payer: Medicare Other

## 2019-09-07 ENCOUNTER — Other Ambulatory Visit: Payer: Self-pay

## 2019-09-07 DIAGNOSIS — Z7982 Long term (current) use of aspirin: Secondary | ICD-10-CM | POA: Diagnosis not present

## 2019-09-07 DIAGNOSIS — Z85828 Personal history of other malignant neoplasm of skin: Secondary | ICD-10-CM | POA: Insufficient documentation

## 2019-09-07 DIAGNOSIS — Z7984 Long term (current) use of oral hypoglycemic drugs: Secondary | ICD-10-CM | POA: Insufficient documentation

## 2019-09-07 DIAGNOSIS — I10 Essential (primary) hypertension: Secondary | ICD-10-CM | POA: Diagnosis not present

## 2019-09-07 DIAGNOSIS — Z79899 Other long term (current) drug therapy: Secondary | ICD-10-CM | POA: Insufficient documentation

## 2019-09-07 DIAGNOSIS — Z87891 Personal history of nicotine dependence: Secondary | ICD-10-CM | POA: Diagnosis not present

## 2019-09-07 DIAGNOSIS — N200 Calculus of kidney: Secondary | ICD-10-CM | POA: Diagnosis not present

## 2019-09-07 DIAGNOSIS — M549 Dorsalgia, unspecified: Secondary | ICD-10-CM | POA: Diagnosis not present

## 2019-09-07 DIAGNOSIS — R319 Hematuria, unspecified: Secondary | ICD-10-CM | POA: Diagnosis present

## 2019-09-07 DIAGNOSIS — E119 Type 2 diabetes mellitus without complications: Secondary | ICD-10-CM | POA: Diagnosis not present

## 2019-09-07 DIAGNOSIS — R310 Gross hematuria: Secondary | ICD-10-CM | POA: Diagnosis not present

## 2019-09-07 LAB — URINALYSIS, ROUTINE W REFLEX MICROSCOPIC
Bacteria, UA: NONE SEEN
RBC / HPF: >50 RBC/hpf — ABNORMAL HIGH (ref 0–5)

## 2019-09-07 LAB — CBC WITH DIFFERENTIAL/PLATELET
Abs Immature Granulocytes: 0.03 10*3/uL (ref 0.00–0.07)
Basophils Absolute: 0 10*3/uL (ref 0.0–0.1)
Basophils Relative: 0 %
Eosinophils Absolute: 0.1 10*3/uL (ref 0.0–0.5)
Eosinophils Relative: 1 %
HCT: 38.3 % (ref 36.0–46.0)
Hemoglobin: 12.5 g/dL (ref 12.0–15.0)
Immature Granulocytes: 0 %
Lymphocytes Relative: 12 %
Lymphs Abs: 1.2 10*3/uL (ref 0.7–4.0)
MCH: 30.4 pg (ref 26.0–34.0)
MCHC: 32.6 g/dL (ref 30.0–36.0)
MCV: 93.2 fL (ref 80.0–100.0)
Monocytes Absolute: 0.8 10*3/uL (ref 0.1–1.0)
Monocytes Relative: 7 %
Neutro Abs: 8.3 10*3/uL — ABNORMAL HIGH (ref 1.7–7.7)
Neutrophils Relative %: 80 %
Platelets: 192 10*3/uL (ref 150–400)
RBC: 4.11 MIL/uL (ref 3.87–5.11)
RDW: 13.4 % (ref 11.5–15.5)
WBC: 10.4 10*3/uL (ref 4.0–10.5)
nRBC: 0 % (ref 0.0–0.2)

## 2019-09-07 LAB — BASIC METABOLIC PANEL
Anion gap: 9 (ref 5–15)
BUN: 21 mg/dL (ref 8–23)
CO2: 27 mmol/L (ref 22–32)
Calcium: 8.9 mg/dL (ref 8.9–10.3)
Chloride: 108 mmol/L (ref 98–111)
Creatinine, Ser: 0.84 mg/dL (ref 0.44–1.00)
GFR calc Af Amer: >60 mL/min (ref >60)
GFR calc non Af Amer: >60 mL/min (ref >60)
Glucose, Bld: 117 mg/dL — ABNORMAL HIGH (ref 70–99)
Potassium: 3.4 mmol/L — ABNORMAL LOW (ref 3.5–5.1)
Sodium: 144 mmol/L (ref 135–145)

## 2019-09-07 MED ORDER — CEPHALEXIN 500 MG PO CAPS: 500.0000 mg | ORAL_CAPSULE | Freq: Two times a day (BID) | ORAL | 0 refills | Status: AC

## 2019-09-07 MED ORDER — ACETAMINOPHEN 500 MG PO TABS
1000.0000 mg | ORAL_TABLET | Freq: Once | ORAL | Status: AC
  Administered 2019-09-07: 1000 mg via ORAL
  Filled 2019-09-07: qty 2

## 2019-09-07 NOTE — ED Notes (Signed)
Pt is resting and appears comfortable.  She is medicated as ordered for her low and upper back pain which she rates 7/10.

## 2019-09-07 NOTE — ED Triage Notes (Signed)
Patient is complaining of hematuria that started last night. Also, complains of mid back pain with nausea, and vaginal pain. Patient has not took any medications for her symptoms.

## 2019-09-07 NOTE — ED Notes (Signed)
Spoke to pt's husband and explain pt's discharge instructions regarding Urology f/u and starting abx therapy

## 2019-09-07 NOTE — ED Provider Notes (Signed)
[facility] DEPT Provider Note   CSN: [id number] Arrival date & time: 09/07/19  0335     History   Chief Complaint Chief Complaint  Patient presents with  . Hematuria    HPI Kristen Mahoney is a 73 y.o. female.     73yo F w/ PMH including HTN, HLD, kidney stones, T2DM, OSA who p/w hematuria. PT began noticing blood in the urine last night which became more pronounced this morning.  She notes 2 days of midline upper back pain that sometimes radiates down her back.  She states the pain is burning and she denies any preceding trauma or change in physical activity.  No unilateral flank pain.  She has reported some mild vaginal pain, no vaginal bleeding and she has had hysterectomy.  Denies abdominal pain.  She has had some nausea but no vomiting, fevers, or recent illness.  No dysuria, urinary frequency, lower extremity weakness/numbness, or incontinence. No anticoagulant use.  The history is provided by the patient.  Hematuria    Past Medical History:  Diagnosis Date  . Allergy    seasonal  . Anxiety   . Arthritis    knees,all over  . Basal cell carcinoma   . Benign breast cyst in female    PATIENT HAS HISTORY OF BREAST CYSTS  . Cataract    beginning bilateral  . Diabetes mellitus   . GERD (gastroesophageal reflux disease)   . Herpes progenitalis   . Hyperlipidemia   . Hypertension   . Kidney stone   . OSA on CPAP   . Sleep apnea    cpap  . Urinary, incontinence, stress female   . Vertigo     Patient Active Problem List   Diagnosis Date Noted  . Weight loss 08/23/2017  . Rectal pain 08/23/2017  . Rectal bleeding 08/23/2017  . Incontinence of feces 07/14/2017  . HTN (hypertension), malignant 07/14/2017  . Anxiety and depression 07/14/2017  . Weight loss, unintentional 07/14/2017  . Gas pain 06/17/2017  . Bloating 06/17/2017  . 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 OSA on CPAP 07/13/2016  . Trichomonas vaginitis 08/12/2015  . Kidney  stone   . DIARRHEA, INFECTIOUS 09/04/2009  . BLOOD IN STOOL-MELENA 09/04/2009  . Abdominal pain, left lower quadrant 09/04/2009  . BLOOD IN STOOL, OCCULT 09/04/2009  . HYPERLIPIDEMIA 12/25/2008  . ANXIETY 12/25/2008  . Reactive depression 12/25/2008  . GERD 12/25/2008  . HIATAL HERNIA 12/25/2008  . Diverticulosis of colon (without mention of hemorrhage) 12/25/2008  . IRRITABLE BOWEL SYNDROME 12/25/2008  . RECTOVAGINAL FISTULA 12/25/2008  . MITRAL VALVE PROLAPSE, HX OF 12/25/2008    Past Surgical History:  Procedure Laterality Date  . ABDOMINAL HYSTERECTOMY  1986   leiomyomata  . Basal cell excised    . breast cystectomy     right  . CHOLECYSTECTOMY  2003  . COLONOSCOPY    . ear cystectomy    . ESOPHAGOGASTRODUODENOSCOPY    . KNEE ARTHROSCOPY     right  . LITHOTRIPSY    . RECTOVAGINAL FISTULA CLOSURE       OB History    Gravida  3   Para  2   Term  2   Preterm      AB  1   Living  2     SAB  1   TAB      Ectopic      Multiple      Live Births  Home Medications    Prior to Admission medications   Medication Sig Start Date End Date Taking? Authorizing Provider  aspirin EC 81 MG tablet Take 81 mg by mouth every evening.    Yes [provider]  atorvastatin (LIPITOR) 20 MG tablet Take 20 mg by mouth daily.  02/14/17  Yes [provider]  buPROPion (WELLBUTRIN XL) 150 MG 24 hr tablet Take 150 mg by mouth daily. 08/08/19  Yes [provider]  fluticasone (FLONASE) 50 MCG/ACT nasal spray Place 1 spray into both nostrils daily. 04/02/19  Yes [provider]  hydrocortisone 1 % ointment Apply 1 application topically 3 (three) times daily. Patient taking differently: Apply 1 application topically 3 (three) times daily as needed for itching.  09/26/18  Yes Esterwood, Amy S, PA-C  hyoscyamine (LEVSIN SL) 0.125 MG SL tablet DISSOLVE 1 TABLET IN MOUTH EVERY 6 HOURS AS NEEDED FOR  CRAMPING Patient taking  differently: Take 0.125 mg by mouth every 6 (six) hours as needed for cramping. DISSOLVE 1 TABLET IN MOUTH EVERY 6 HOURS AS NEEDED FOR  CRAMPING 02/26/19  Yes Nandigam, Venia Minks, MD  ibuprofen (ADVIL) 800 MG tablet Take 800 mg by mouth every 8 (eight) hours as needed for pain. 08/28/19  Yes [provider]  loratadine (CLARITIN) 10 MG tablet Take 10 mg by mouth daily as needed for allergies.   Yes [provider]  metFORMIN (GLUCOPHAGE-XR) 500 MG 24 hr tablet Take 500 mg by mouth 2 (two) times daily. 07/23/19  Yes [provider]  metoprolol succinate (TOPROL XL) 50 MG 24 hr tablet Take 1 tablet (50 mg total) by mouth daily. Take with or immediately following a meal. 12/29/12  Yes Mabe, Forbes Cellar, MD  Multiple Vitamins-Minerals (ICAPS) CAPS Take 2 capsules by mouth daily.    Yes [provider]  omeprazole (PRILOSEC) 20 MG capsule Take 1 capsule (20 mg total) by mouth 2 (two) times daily before a meal. 09/08/18  Yes Nandigam, Kavitha V, MD  RESTASIS 0.05 % ophthalmic emulsion Place 1 drop into both eyes every 12 (twelve) hours. 06/28/19  Yes [provider]  valACYclovir (VALTREX) 500 MG tablet Take one tablet twice daily for 3-5 days as needed with an outbreak, then daily as needed 03/07/19  Yes Young, Candiss Norse, NP  Vitamin D, Cholecalciferol, 400 units TABS Take by mouth.   Yes [provider]  vitamin E (VITAMIN E) 400 UNIT capsule Take 400 Units by mouth 2 (two) times daily.    Yes [provider]  cephALEXin (KEFLEX) 500 MG capsule Take 1 capsule (500 mg total) by mouth 2 (two) times daily for 7 days. 09/07/19 09/14/19  Sherida Dobkins, Wenda Overland, MD    Family History Family History  Problem Relation Age of Onset  . Diabetes Mother   . Diabetes Father   . Colon cancer Father   . Heart disease Father   . Hypertension Father   . Diabetes Sister   . Hypertension Sister   . Diabetes Brother   . Colon polyps Brother   . Prostate cancer Brother    . Breast cancer Paternal Aunt        Age 62's  . Colon polyps Brother   . Heart disease Brother   . Stroke Brother   . Hyperlipidemia Brother   . Colon polyps Sister   . Diabetes Sister   . Hypertension Sister   . Heart disease Sister   . Esophageal cancer Neg Hx   . Rectal  cancer Neg Hx   . Stomach cancer Neg Hx     Social History Social History   Tobacco Use  . Smoking status: Former Research scientist (life sciences)  . Smokeless tobacco: Never Used  Substance Use Topics  . Alcohol use: Yes    Comment: Rare  . Drug use: No     Allergies   Ciprofloxacin and Morphine and related   Review of Systems Review of Systems  Genitourinary: Positive for hematuria.   All other systems reviewed and are negative except that which was mentioned in HPI   Physical Exam Updated Vital Signs BP (!) 145/70   Pulse 89   Temp 98.2 F (36.8 C) (Oral)   Resp 16   Ht 5\' 2"  (1.575 m)   Wt 60.8 kg   SpO2 97%   BMI 24.51 kg/m   Physical Exam Vitals signs and nursing note reviewed.  Constitutional:      General: She is not in acute distress.    Appearance: She is well-developed.  HENT:     Head: Normocephalic and atraumatic.     Mouth/Throat:     Mouth: Mucous membranes are moist.     Pharynx: Oropharynx is clear.  Eyes:     Conjunctiva/sclera: Conjunctivae normal.  Neck:     Musculoskeletal: Neck supple.  Cardiovascular:     Rate and Rhythm: Normal rate and regular rhythm.     Heart sounds: Normal heart sounds. No murmur.  Pulmonary:     Effort: Pulmonary effort is normal.     Breath sounds: Normal breath sounds.  Abdominal:     General: Bowel sounds are normal. There is no distension.     Palpations: Abdomen is soft.     Tenderness: There is abdominal tenderness. There is no guarding or rebound.     Comments: Mild suprapubic and b/l lower quadrant tenderness  Musculoskeletal:     Comments: Mild tenderness R mid thoracic paraspinal muscles between scapulae  Skin:    General: Skin is warm  and dry.  Neurological:     Mental Status: She is alert and oriented to person, place, and time.     Comments: Fluent speech  Psychiatric:        Judgment: Judgment normal.      ED Treatments / Results  Labs (all labs ordered are listed, but only abnormal results are displayed) Labs Reviewed  URINALYSIS, ROUTINE W REFLEX MICROSCOPIC - Abnormal; Notable for the following components:      Result Value   Color, Urine RED (*)    APPearance TURBID (*)    Glucose, UA   (*)    Value: TEST NOT REPORTED DUE TO COLOR INTERFERENCE OF URINE PIGMENT   Hgb urine dipstick   (*)    Value: TEST NOT REPORTED DUE TO COLOR INTERFERENCE OF URINE PIGMENT   Bilirubin Urine   (*)    Value: TEST NOT REPORTED DUE TO COLOR INTERFERENCE OF URINE PIGMENT   Ketones, ur   (*)    Value: TEST NOT REPORTED DUE TO COLOR INTERFERENCE OF URINE PIGMENT   Protein, ur   (*)    Value: TEST NOT REPORTED DUE TO COLOR INTERFERENCE OF URINE PIGMENT   Nitrite   (*)    Value: TEST NOT REPORTED DUE TO COLOR INTERFERENCE OF URINE PIGMENT   Leukocytes,Ua   (*)    Value: TEST NOT REPORTED DUE TO COLOR INTERFERENCE OF URINE PIGMENT   RBC / HPF >50 (*)    All other components within normal  limits  BASIC METABOLIC PANEL - Abnormal; Notable for the following components:   Potassium 3.4 (*)    Glucose, Bld 117 (*)    All other components within normal limits  CBC WITH DIFFERENTIAL/PLATELET - Abnormal; Notable for the following components:   Neutro Abs 8.3 (*)    All other components within normal limits  URINE CULTURE    EKG None  Radiology Ct Renal Stone Study  Result Date: 09/07/2019 CLINICAL DATA:  Gross hematuria. EXAM: CT ABDOMEN AND PELVIS WITHOUT CONTRAST TECHNIQUE: Multidetector CT imaging of the abdomen and pelvis was performed following the standard protocol without IV contrast. COMPARISON:  CT scan of March 22, 2019. FINDINGS: Lower chest: No acute abnormality. Hepatobiliary: No focal liver abnormality is seen.  Status post cholecystectomy. No biliary dilatation. Pancreas: Unremarkable. No pancreatic ductal dilatation or surrounding inflammatory changes. Spleen: Normal in size without focal abnormality. Adrenals/Urinary Tract: Adrenal glands appear normal. Bilateral nephrolithiasis is noted. No hydronephrosis or renal obstruction is noted. No definite ureteral calculi are noted. Linear hyperdensity is noted posteriorly in the urinary bladder which may represent blood, but possible mass cannot be excluded. Stomach/Bowel: Stomach is within normal limits. Appendix appears normal. No evidence of bowel wall thickening, distention, or inflammatory changes. Vascular/Lymphatic: Aortic atherosclerosis. No enlarged abdominal or pelvic lymph nodes. Reproductive: Status post hysterectomy. No adnexal masses. Other: No abdominal wall hernia or abnormality. No abdominopelvic ascites. Musculoskeletal: No acute or significant osseous findings. IMPRESSION: Bilateral nephrolithiasis is noted. No hydronephrosis or renal obstruction is noted. Linear hyperdensity is noted posteriorly in the urinary bladder which most likely represents blood, but possible mass cannot be excluded. Cystoscopy is recommended for further evaluation. Aortic Atherosclerosis (ICD10-I70.0). Electronically Signed   By: Marijo Conception M.D.   On: 09/07/2019 08:48    Procedures Procedures (including critical care time)  Medications Ordered in ED Medications  acetaminophen (TYLENOL) tablet 1,000 mg (1,000 mg Oral Given 09/07/19 [id number])     Initial Impression / Assessment and Plan / ED Course  I have reviewed the triage vital signs and the nursing notes.  Pertinent labs & imaging results that were available during my care of the patient were reviewed by me and considered in my medical decision making (see chart for details).         Well appearing on exam w/ reassuring VS. DDx for gross hematuria includes kidney stone, hemorrhagic cystitis, vaginal bleeding.  Bleeding source does not sound GI.  Lab work shows normal CBC and normal creatinine, UA with gross blood.  Urine culture added.  Obtained CT to evaluate for kidney stones given patient's history of the same.  CT shows no evidence of ureteral stone and no hydronephrosis or evidence of obstruction.  She does have likely blood clots in bladder, differential including mass.  Given her gross hematuria, she will likely need eventual cystoscopy, have discussed need for follow-up with [facility] for further evaluation.  In the meantime, will cover with antibiotics in the event that symptoms are due to hemorrhagic cystitis.  I have extensively reviewed return precautions with her including severe pain, fever, or urinary obstruction.  She voiced understanding.  Final Clinical Impressions(s) / ED Diagnoses   Final diagnoses:  Gross hematuria    ED Discharge Orders         Ordered    cephALEXin (KEFLEX) 500 MG capsule  2 times daily     09/07/19 1054           Saidy Ormand, Wenda Overland, MD 09/07/19 1057

## 2019-09-09 LAB — URINE CULTURE: Culture: >=100000 — AB

## 2019-09-10 ENCOUNTER — Telehealth: Payer: Self-pay | Admitting: Emergency Medicine

## 2019-09-10 DIAGNOSIS — N3001 Acute cystitis with hematuria: Secondary | ICD-10-CM | POA: Diagnosis not present

## 2019-09-10 DIAGNOSIS — N200 Calculus of kidney: Secondary | ICD-10-CM | POA: Diagnosis not present

## 2019-09-10 DIAGNOSIS — E119 Type 2 diabetes mellitus without complications: Secondary | ICD-10-CM | POA: Diagnosis not present

## 2019-09-10 DIAGNOSIS — R310 Gross hematuria: Secondary | ICD-10-CM | POA: Diagnosis not present

## 2019-09-10 NOTE — Telephone Encounter (Signed)
Post ED Visit - Positive Culture Follow-up  Culture report reviewed by antimicrobial stewardship pharmacist: [facility] Team []  Elenor Quinones, Pharm.D. []  Heide Guile, Pharm.D., BCPS AQ-ID []  Parks Neptune, Pharm.D., BCPS []  Alycia Rossetti, Pharm.D., BCPS []  [place], Pharm.D., BCPS, AAHIVP []  Legrand Como, Pharm.D., BCPS, AAHIVP []  Salome Arnt, PharmD, BCPS []  Johnnette Gourd, PharmD, BCPS []  Hughes Better, PharmD, BCPS []  Leeroy Cha, PharmD []  Laqueta Linden, PharmD, BCPS []  Albertina Parr, PharmD  [facility] [place] Team []  Leodis Sias, PharmD []  Lindell Spar, PharmD []  Royetta Asal, PharmD []  Graylin Shiver, Rph []  Rema Fendt) Glennon Mac, PharmD []  Arlyn Dunning, PharmD []  Netta Cedars, PharmD [x]  Dia Sitter, PharmD []  Leone Haven, PharmD []  Gretta Arab, PharmD []  Theodis Shove, PharmD []  Peggyann Juba, PharmD []  Reuel Boom, PharmD   Positive urine culture Treated with cephalexin, organism sensitive to the same and no further patient follow-up is required at this time.  Hazle Nordmann 09/10/2019, 12:01 PM

## 2019-09-17 ENCOUNTER — Encounter (HOSPITAL_COMMUNITY): Payer: Medicare Other

## 2019-09-19 DIAGNOSIS — I10 Essential (primary) hypertension: Secondary | ICD-10-CM | POA: Diagnosis not present

## 2019-09-19 DIAGNOSIS — J309 Allergic rhinitis, unspecified: Secondary | ICD-10-CM | POA: Diagnosis not present

## 2019-09-19 DIAGNOSIS — E119 Type 2 diabetes mellitus without complications: Secondary | ICD-10-CM | POA: Diagnosis not present

## 2019-09-19 DIAGNOSIS — E785 Hyperlipidemia, unspecified: Secondary | ICD-10-CM | POA: Diagnosis not present

## 2019-09-19 NOTE — Patient Instructions (Addendum)
DUE TO COVID-19 ONLY ONE VISITOR IS ALLOWED TO COME WITH YOU AND STAY IN THE WAITING ROOM ONLY DURING PRE OP AND PROCEDURE DAY OF SURGERY. THE 1 VISITOR MAY VISIT WITH YOU AFTER SURGERY IN YOUR PRIVATE ROOM DURING VISITING HOURS ONLY!  YOU NEED TO HAVE A COVID 19 TEST ON Thursday_09/24/2020_ @_12 :50pm______, THIS TEST MUST BE DONE BEFORE SURGERY, COME  [address], [place] , [address].  ([place]) ONCE YOUR COVID TEST IS COMPLETED, PLEASE BEGIN THE QUARANTINE INSTRUCTIONS AS OUTLINED IN YOUR HANDOUT.                Kristen Mahoney    Your procedure is scheduled on: Monday 09/24/2019   Report to [facility]  Entr[facility]   Report to admitting at  08:50AM   How to Manage Your Diabetes Before and After Surgery  Why is it important to control my blood sugar before and after surgery? . Improving blood sugar levels before and after surgery helps healing and can limit problems. . A way of improving blood sugar control is eating a healthy diet by: o  Eating less sugar and carbohydrates o  Increasing activity/exercise o  Talking with your doctor about reaching your blood sugar goals . High blood sugars (greater than 180 mg/dL) can raise your risk of infections and slow your recovery, so you will need to focus on controlling your diabetes during the weeks before surgery. . Make sure that the doctor who takes care of your diabetes knows about your planned surgery including the date and location.  How do I manage my blood sugar before surgery? . Check your blood sugar at least 4 times a day, starting 2 days before surgery, to make sure that the level is not too high or low. o Check your blood sugar the morning of your surgery when you wake up and every 2 hours until you get to the Short Stay unit. . If your blood sugar is less than 70 mg/dL, you will need to treat for low blood sugar: o Do not take insulin. o Treat a low blood sugar (less than 70 mg/dL) with  cup  of clear juice (cranberry or apple), 4 glucose tablets, OR glucose gel. o Recheck blood sugar in 15 minutes after treatment (to make sure it is greater than 70 mg/dL). If your blood sugar is not greater than 70 mg/dL on recheck, call [phone] for further instructions. . Report your blood sugar to the short stay nurse when you get to Short Stay.  . If you are admitted to the hospital after surgery: o Your blood sugar will be checked by the staff and you will probably be given insulin after surgery (instead of oral diabetes medicines) to make sure you have good blood sugar levels. o The goal for blood sugar control after surgery is 80-180 mg/dL.   WHAT DO I DO ABOUT MY DIABETES MEDICATION?         The day before surgery, take Metformin as usual  . Do not take oral diabetes medicines (pills) the morning of surgery.     Call this number if you have problems the morning of surgery [phone]    Remember: Do not eat food :After Midnight.       NO SOLID FOOD AFTER MIDNIGHT THE NIGHT PRIOR TO SURGERY. NOTHING BY MOUTH EXCEPT CLEAR LIQUIDS UNTIL 08:20am  PLEASE FINISH ENSURE DRINK PER SURGEON ORDER  WHICH NEEDS TO BE COMPLETED AT 08:20am.   CLEAR  LIQUID DIET   Foods Allowed                                                                     Foods Excluded  Coffee and tea, regular and decaf                             liquids that you cannot  Plain Jell-O any favor except red or purple                                           see through such as: Fruit ices (not with fruit pulp)                                     milk, soups, orange juice  Iced Popsicles                                    All solid food Carbonated beverages, regular and diet                                    Cranberry, grape and apple juices Sports drinks like Gatorade Lightly seasoned clear broth or consume(fat free) Sugar, honey syrup  Sample Menu Breakfast                                Lunch                                      Supper Cranberry juice                    Beef broth                            Chicken broth Jell-O                                     Grape juice                           Apple juice Coffee or tea                        Jell-O                                      Popsicle  Coffee or tea                        Coffee or tea  _____________________________________________________________________    [name] BRUSH YOUR TEETH MORNING OF SURGERY AND RINSE YOUR MOUTH OUT, NO CHEWING GUM CANDY OR MINTS.     Take these medicines the morning of surgery with A SIP OF WATER: Wellbutrin, Lipitor, Metoprolol, Restasis eye drops, Prilosec  DO NOT TAKE ANY DIABETIC MEDICATIONS DAY OF YOUR SURGERY!!                               You may not have any metal on your body including hair pins and              piercings  Do not wear jewelry, make-up, lotions, powders or perfumes, deodorant             Do not wear nail polish on your fingernails.  Do not shave  48 hours prior to surgery.              Do not bring valuables to the hospital. [place].  Contacts, dentures or bridgework may not be worn into surgery.  Leave suitcase in the car. After surgery it may be brought to your room.               Please read over the following fact sheets you were given: _____________________________________________________________________             [facility] - Preparing for Surgery Before surgery, you can play an important role.  Because skin is not sterile, your skin needs to be as free of germs as possible.  You can reduce the number of germs on your skin by washing with CHG (chlorahexidine gluconate) soap before surgery.  CHG is an antiseptic cleaner which kills germs and bonds with the skin to continue killing germs even after washing. Please DO NOT use if you have an allergy to CHG or antibacterial  soaps.  If your skin becomes reddened/irritated stop using the CHG and inform your nurse when you arrive at Short Stay. Do not shave (including legs and underarms) for at least 48 hours prior to the first CHG shower.  You may shave your face/neck. Please follow these instructions carefully:  1.  Shower with CHG Soap the night before surgery and the  morning of Surgery.  2.  If you choose to wash your hair, wash your hair first as usual with your  normal  shampoo.  3.  After you shampoo, rinse your hair and body thoroughly to remove the  shampoo.                           4.  Use CHG as you would any other liquid soap.  You can apply chg directly  to the skin and wash                       Gently with a scrungie or clean washcloth.  5.  Apply the CHG Soap to your body ONLY FROM THE NECK DOWN.   Do not use on face/ open  Wound or open sores. Avoid contact with eyes, ears mouth and genitals (private parts).                       Wash face,  Genitals (private parts) with your normal soap.             6.  Wash thoroughly, paying special attention to the area where your surgery  will be performed.  7.  Thoroughly rinse your body with warm water from the neck down.  8.  DO NOT shower/wash with your normal soap after using and rinsing off  the CHG Soap.                9.  Pat yourself dry with a clean towel.            10.  Wear clean pajamas.            11.  Place clean sheets on your bed the night of your first shower and do not  sleep with pets. Day of Surgery : Do not apply any lotions/deodorants the morning of surgery.  Please wear clean clothes to the hospital/surgery center.  FAILURE TO FOLLOW THESE INSTRUCTIONS MAY RESULT IN THE CANCELLATION OF YOUR SURGERY PATIENT SIGNATURE_________________________________  NURSE SIGNATURE__________________________________  ________________________________________________________________________   Kristen Mahoney  An  incentive spirometer is a tool that can help keep your lungs clear and active. This tool measures how well you are filling your lungs with each breath. Taking long deep breaths may help reverse or decrease the chance of developing breathing (pulmonary) problems (especially infection) following:  A long period of time when you are unable to move or be active. BEFORE THE PROCEDURE   If the spirometer includes an indicator to show your best effort, your nurse or respiratory therapist will set it to a desired goal.  If possible, sit up straight or lean slightly forward. Try not to slouch.  Hold the incentive spirometer in an upright position. INSTRUCTIONS FOR USE  1. Sit on the edge of your bed if possible, or sit up as far as you can in bed or on a chair. 2. Hold the incentive spirometer in an upright position. 3. Breathe out normally. 4. Place the mouthpiece in your mouth and seal your lips tightly around it. 5. Breathe in slowly and as deeply as possible, raising the piston or the ball toward the top of the column. 6. Hold your breath for 3-5 seconds or for as long as possible. Allow the piston or ball to fall to the bottom of the column. 7. Remove the mouthpiece from your mouth and breathe out normally. 8. Rest for a few seconds and repeat Steps 1 through 7 at least 10 times every 1-2 hours when you are awake. Take your time and take a few normal breaths between deep breaths. 9. The spirometer may include an indicator to show your best effort. Use the indicator as a goal to work toward during each repetition. 10. After each set of 10 deep breaths, practice coughing to be sure your lungs are clear. If you have an incision (the cut made at the time of surgery), support your incision when coughing by placing a pillow or rolled up towels firmly against it. Once you are able to get out of bed, walk around indoors and cough well. You may stop using the incentive spirometer when instructed by your  caregiver.  RISKS AND COMPLICATIONS  Take your time so you do not  get dizzy or light-headed.  If you are in pain, you may need to take or ask for pain medication before doing incentive spirometry. It is harder to take a deep breath if you are having pain. AFTER USE  Rest and breathe slowly and easily.  It can be helpful to keep track of a log of your progress. Your caregiver can provide you with a simple table to help with this. If you are using the spirometer at home, follow these instructions: [place] IF:   You are having difficultly using the spirometer.  You have trouble using the spirometer as often as instructed.  Your pain medication is not giving enough relief while using the spirometer.  You develop fever of 100.5 F (38.1 C) or higher. SEEK IMMEDIATE MEDICAL CARE IF:   You cough up bloody sputum that had not been present before.  You develop fever of 102 F (38.9 C) or greater.  You develop worsening pain at or near the incision site. MAKE SURE YOU:   Understand these instructions.  Will watch your condition.  Will get help right away if you are not doing well or get worse. Document Released: 04/25/2007 Document Revised: 03/06/2012 Document Reviewed: 06/26/2007 Exit[employer] Patient Information 2014 [employer], [place].   ________________________________________________________________________  WHAT IS A BLOOD TRANSFUSION? Blood Transfusion Information  A transfusion is the replacement of blood or some of its parts. Blood is made up of multiple cells which provide different functions.  Red blood cells carry oxygen and are used for blood loss replacement.  White blood cells fight against infection.  Platelets control bleeding.  Plasma helps clot blood.  Other blood products are available for specialized needs, such as hemophilia or other clotting disorders. BEFORE THE TRANSFUSION  Who gives blood for transfusions?   Healthy volunteers who are fully  evaluated to make sure their blood is safe. This is blood bank blood. Transfusion therapy is the safest it has ever been in the practice of medicine. Before blood is taken from a donor, a complete history is taken to make sure that person has no history of diseases nor engages in risky social behavior (examples are intravenous drug use or sexual activity with multiple partners). The donor's travel history is screened to minimize risk of transmitting infections, such as malaria. The donated blood is tested for signs of infectious diseases, such as HIV and hepatitis. The blood is then tested to be sure it is compatible with you in order to minimize the chance of a transfusion reaction. If you or a relative donates blood, this is often done in anticipation of surgery and is not appropriate for emergency situations. It takes many days to process the donated blood. RISKS AND COMPLICATIONS Although transfusion therapy is very safe and saves many lives, the main dangers of transfusion include:   Getting an infectious disease.  Developing a transfusion reaction. This is an allergic reaction to something in the blood you were given. Every precaution is taken to prevent this. The decision to have a blood transfusion has been considered carefully by your caregiver before blood is given. Blood is not given unless the benefits outweigh the risks. AFTER THE TRANSFUSION  Right after receiving a blood transfusion, you will usually feel much better and more energetic. This is especially true if your red blood cells have gotten low (anemic). The transfusion raises the level of the red blood cells which carry oxygen, and this usually causes an energy increase.  The nurse administering the transfusion will  monitor you carefully for complications. HOME CARE INSTRUCTIONS  No special instructions are needed after a transfusion. You may find your energy is better. Speak with your caregiver about any limitations on activity  for underlying diseases you may have. SEEK MEDICAL CARE IF:   Your condition is not improving after your transfusion.  You develop redness or irritation at the intravenous (IV) site. SEEK IMMEDIATE MEDICAL CARE IF:  Any of the following symptoms occur over the next 12 hours:  Shaking chills.  You have a temperature by mouth above 102 F (38.9 C), not controlled by medicine.  Chest, back, or muscle pain.  People around you feel you are not acting correctly or are confused.  Shortness of breath or difficulty breathing.  Dizziness and fainting.  You get a rash or develop hives.  You have a decrease in urine output.  Your urine turns a dark color or changes to pink, red, or brown. Any of the following symptoms occur over the next 10 days:  You have a temperature by mouth above 102 F (38.9 C), not controlled by medicine.  Shortness of breath.  Weakness after normal activity.  The white part of the eye turns yellow (jaundice).  You have a decrease in the amount of urine or are urinating less often.  Your urine turns a dark color or changes to pink, red, or brown. Document Released: 12/10/2000 Document Revised: 03/06/2012 Document Reviewed: 07/29/2008 [facility] Patient Information 2014 [place], [place].  _______________________________________________________________________

## 2019-09-20 ENCOUNTER — Other Ambulatory Visit: Payer: Self-pay

## 2019-09-20 ENCOUNTER — Encounter (HOSPITAL_COMMUNITY): Payer: Medicare Other

## 2019-09-20 ENCOUNTER — Encounter (HOSPITAL_COMMUNITY)
Admission: RE | Admit: 2019-09-20 | Discharge: 2019-09-20 | Disposition: A | Discharge status: Home / Self Care | Payer: Medicare Other | Source: Ambulatory Visit | Attending: Orthopedic Surgery | Admitting: Orthopedic Surgery

## 2019-09-20 ENCOUNTER — Other Ambulatory Visit (HOSPITAL_COMMUNITY)
Admission: RE | Admit: 2019-09-20 | Discharge: 2019-09-20 | Disposition: A | Discharge status: Home / Self Care | Payer: Medicare Other | Source: Ambulatory Visit

## 2019-09-20 ENCOUNTER — Encounter (HOSPITAL_COMMUNITY): Payer: Self-pay

## 2019-09-20 ENCOUNTER — Ambulatory Visit (HOSPITAL_COMMUNITY)
Admission: RE | Admit: 2019-09-20 | Discharge: 2019-09-20 | Disposition: A | Discharge status: Home / Self Care | Payer: Medicare Other | Source: Ambulatory Visit | Attending: Orthopedic Surgery | Admitting: Orthopedic Surgery

## 2019-09-20 DIAGNOSIS — Z87891 Personal history of nicotine dependence: Secondary | ICD-10-CM | POA: Diagnosis not present

## 2019-09-20 DIAGNOSIS — E785 Hyperlipidemia, unspecified: Secondary | ICD-10-CM | POA: Diagnosis not present

## 2019-09-20 DIAGNOSIS — M1712 Unilateral primary osteoarthritis, left knee: Secondary | ICD-10-CM | POA: Insufficient documentation

## 2019-09-20 DIAGNOSIS — R310 Gross hematuria: Secondary | ICD-10-CM | POA: Diagnosis not present

## 2019-09-20 DIAGNOSIS — Z01818 Encounter for other preprocedural examination: Secondary | ICD-10-CM

## 2019-09-20 DIAGNOSIS — Z20828 Contact with and (suspected) exposure to other viral communicable diseases: Secondary | ICD-10-CM | POA: Insufficient documentation

## 2019-09-20 DIAGNOSIS — I10 Essential (primary) hypertension: Secondary | ICD-10-CM | POA: Insufficient documentation

## 2019-09-20 DIAGNOSIS — Z7984 Long term (current) use of oral hypoglycemic drugs: Secondary | ICD-10-CM | POA: Diagnosis not present

## 2019-09-20 DIAGNOSIS — K219 Gastro-esophageal reflux disease without esophagitis: Secondary | ICD-10-CM | POA: Diagnosis not present

## 2019-09-20 DIAGNOSIS — F419 Anxiety disorder, unspecified: Secondary | ICD-10-CM | POA: Insufficient documentation

## 2019-09-20 DIAGNOSIS — E119 Type 2 diabetes mellitus without complications: Secondary | ICD-10-CM | POA: Diagnosis not present

## 2019-09-20 DIAGNOSIS — Z90710 Acquired absence of both cervix and uterus: Secondary | ICD-10-CM | POA: Insufficient documentation

## 2019-09-20 DIAGNOSIS — Z79899 Other long term (current) drug therapy: Secondary | ICD-10-CM | POA: Insufficient documentation

## 2019-09-20 DIAGNOSIS — Z9049 Acquired absence of other specified parts of digestive tract: Secondary | ICD-10-CM | POA: Diagnosis not present

## 2019-09-20 DIAGNOSIS — G4733 Obstructive sleep apnea (adult) (pediatric): Secondary | ICD-10-CM | POA: Diagnosis not present

## 2019-09-20 DIAGNOSIS — Z7982 Long term (current) use of aspirin: Secondary | ICD-10-CM | POA: Diagnosis not present

## 2019-09-20 LAB — URINALYSIS, ROUTINE W REFLEX MICROSCOPIC
Bilirubin Urine: NEGATIVE
Glucose, UA: NEGATIVE mg/dL
Hgb urine dipstick: NEGATIVE
Ketones, ur: NEGATIVE mg/dL
Nitrite: NEGATIVE
Protein, ur: 30 mg/dL — AB
Specific Gravity, Urine: 1.012 (ref 1.005–1.030)
pH: 5 (ref 5.0–8.0)

## 2019-09-20 LAB — CBC WITH DIFFERENTIAL/PLATELET
Abs Immature Granulocytes: 0.02 10*3/uL (ref 0.00–0.07)
Basophils Absolute: 0 10*3/uL (ref 0.0–0.1)
Basophils Relative: 1 %
Eosinophils Absolute: 0 10*3/uL (ref 0.0–0.5)
Eosinophils Relative: 1 %
HCT: 39.6 % (ref 36.0–46.0)
Hemoglobin: 12.8 g/dL (ref 12.0–15.0)
Immature Granulocytes: 0 %
Lymphocytes Relative: 14 %
Lymphs Abs: 1 10*3/uL (ref 0.7–4.0)
MCH: 30.2 pg (ref 26.0–34.0)
MCHC: 32.3 g/dL (ref 30.0–36.0)
MCV: 93.4 fL (ref 80.0–100.0)
Monocytes Absolute: 0.5 10*3/uL (ref 0.1–1.0)
Monocytes Relative: 8 %
Neutro Abs: 5.6 10*3/uL (ref 1.7–7.7)
Neutrophils Relative %: 76 %
Platelets: 226 10*3/uL (ref 150–400)
RBC: 4.24 MIL/uL (ref 3.87–5.11)
RDW: 14 % (ref 11.5–15.5)
WBC: 7.2 10*3/uL (ref 4.0–10.5)
nRBC: 0 % (ref 0.0–0.2)

## 2019-09-20 LAB — BASIC METABOLIC PANEL
Anion gap: 11 (ref 5–15)
BUN: 30 mg/dL — ABNORMAL HIGH (ref 8–23)
CO2: 22 mmol/L (ref 22–32)
Calcium: 9.1 mg/dL (ref 8.9–10.3)
Chloride: 112 mmol/L — ABNORMAL HIGH (ref 98–111)
Creatinine, Ser: 2.43 mg/dL — ABNORMAL HIGH (ref 0.44–1.00)
GFR calc Af Amer: 22 mL/min — ABNORMAL LOW (ref >60)
GFR calc non Af Amer: 19 mL/min — ABNORMAL LOW (ref >60)
Glucose, Bld: 105 mg/dL — ABNORMAL HIGH (ref 70–99)
Potassium: 4.2 mmol/L (ref 3.5–5.1)
Sodium: 145 mmol/L (ref 135–145)

## 2019-09-20 LAB — HEMOGLOBIN A1C
Hgb A1c MFr Bld: 6.2 % — ABNORMAL HIGH (ref 4.8–5.6)
Mean Plasma Glucose: 131.24 mg/dL

## 2019-09-20 LAB — SURGICAL PCR SCREEN
MRSA, PCR: NEGATIVE
Staphylococcus aureus: NEGATIVE

## 2019-09-20 LAB — PROTIME-INR
INR: 0.9 (ref 0.8–1.2)
Prothrombin Time: 12.5 seconds (ref 11.4–15.2)

## 2019-09-20 LAB — ABO/RH: ABO/RH(D): A POS

## 2019-09-20 LAB — GLUCOSE, CAPILLARY: Glucose-Capillary: 119 mg/dL — ABNORMAL HIGH (ref 70–99)

## 2019-09-20 LAB — APTT: aPTT: 28 seconds (ref 24–36)

## 2019-09-20 NOTE — Progress Notes (Addendum)
PCP -  [facility])  Cardiologist - Dr. Terrence Dupont [phone] needs cardiac clearance: CARDIAC clearance is on chart from Dr. Terrence Dupont, clearance on 08/03/2019 Last office visit from Dr. Terrence Dupont on 09/19/2019 on chart.  Pt stated that she used to wear a heart monitor due to tachycardia  Chest x-ray - 09/20/2019  EKG - 04/30/2017 and (05/03/2018 on chart)  Stress Test - none  ECHO - none  Cardiac Cath -none   Sleep Study - not sure when it was done CPAP - yes... sees Dr. Brett Fairy, telephone message on 07/10/2019 Settings she thinks is a 5.  Fasting Blood Sugar - 120 Checks Blood Sugar _1____ times a day  Blood Thinner Instructions: Aspirin Instructions: 81 mg Pt said that she would get info from Dr. Terrence Dupont about when to stop aspirin Last Dose:  Pt came to ER last week for a Hemorrhage, per ED note it stated hematuria. Note 09/07/2019 She stated that she will see urology today Dr. Alyson Ingles for surgical Clearance.   Anesthesia review:   Patient denies shortness of breath, fever, cough and chest pain at PAT appointment   Patient verbalized understanding of instructions that were given to them at the PAT appointment. Patient was also instructed that they will need to review over the PAT instructions again at home before surgery.

## 2019-09-21 ENCOUNTER — Emergency Department (HOSPITAL_COMMUNITY)
Admission: EM | Admit: 2019-09-21 | Discharge: 2019-09-21 | Disposition: A | Discharge status: Home / Self Care | Payer: Medicare Other | Attending: Emergency Medicine | Admitting: Emergency Medicine

## 2019-09-21 ENCOUNTER — Other Ambulatory Visit: Payer: Self-pay

## 2019-09-21 ENCOUNTER — Encounter (HOSPITAL_COMMUNITY): Payer: Self-pay | Admitting: Physician Assistant

## 2019-09-21 ENCOUNTER — Encounter (HOSPITAL_COMMUNITY): Payer: Self-pay | Admitting: Emergency Medicine

## 2019-09-21 DIAGNOSIS — Z79899 Other long term (current) drug therapy: Secondary | ICD-10-CM | POA: Insufficient documentation

## 2019-09-21 DIAGNOSIS — E119 Type 2 diabetes mellitus without complications: Secondary | ICD-10-CM | POA: Diagnosis not present

## 2019-09-21 DIAGNOSIS — Z7982 Long term (current) use of aspirin: Secondary | ICD-10-CM | POA: Diagnosis not present

## 2019-09-21 DIAGNOSIS — Z7984 Long term (current) use of oral hypoglycemic drugs: Secondary | ICD-10-CM | POA: Diagnosis not present

## 2019-09-21 DIAGNOSIS — I10 Essential (primary) hypertension: Secondary | ICD-10-CM | POA: Diagnosis not present

## 2019-09-21 DIAGNOSIS — N179 Acute kidney failure, unspecified: Secondary | ICD-10-CM | POA: Insufficient documentation

## 2019-09-21 DIAGNOSIS — M1711 Unilateral primary osteoarthritis, right knee: Secondary | ICD-10-CM | POA: Diagnosis present

## 2019-09-21 DIAGNOSIS — R944 Abnormal results of kidney function studies: Secondary | ICD-10-CM | POA: Diagnosis not present

## 2019-09-21 DIAGNOSIS — Z87891 Personal history of nicotine dependence: Secondary | ICD-10-CM | POA: Diagnosis not present

## 2019-09-21 LAB — COMPREHENSIVE METABOLIC PANEL
ALT: 10 U/L (ref 0–44)
AST: 15 U/L (ref 15–41)
Albumin: 4.2 g/dL (ref 3.5–5.0)
Alkaline Phosphatase: 67 U/L (ref 38–126)
Anion gap: 12 (ref 5–15)
BUN: 30 mg/dL — ABNORMAL HIGH (ref 8–23)
CO2: 23 mmol/L (ref 22–32)
Calcium: 8.9 mg/dL (ref 8.9–10.3)
Chloride: 111 mmol/L (ref 98–111)
Creatinine, Ser: 1.83 mg/dL — ABNORMAL HIGH (ref 0.44–1.00)
GFR calc Af Amer: 31 mL/min — ABNORMAL LOW (ref >60)
GFR calc non Af Amer: 27 mL/min — ABNORMAL LOW (ref >60)
Glucose, Bld: 96 mg/dL (ref 70–99)
Potassium: 3.3 mmol/L — ABNORMAL LOW (ref 3.5–5.1)
Sodium: 146 mmol/L — ABNORMAL HIGH (ref 135–145)
Total Bilirubin: 0.6 mg/dL (ref 0.3–1.2)
Total Protein: 7 g/dL (ref 6.5–8.1)

## 2019-09-21 LAB — URINALYSIS, ROUTINE W REFLEX MICROSCOPIC
Bilirubin Urine: NEGATIVE
Glucose, UA: NEGATIVE mg/dL
Hgb urine dipstick: NEGATIVE
Ketones, ur: NEGATIVE mg/dL
Nitrite: NEGATIVE
Protein, ur: 30 mg/dL — AB
Specific Gravity, Urine: 1.014 (ref 1.005–1.030)
WBC, UA: >50 WBC/hpf — ABNORMAL HIGH (ref 0–5)
pH: 5 (ref 5.0–8.0)

## 2019-09-21 LAB — NOVEL CORONAVIRUS, NAA (HOSP ORDER, SEND-OUT TO REF LAB; TAT 18-24 HRS): SARS-CoV-2, NAA: NOT DETECTED

## 2019-09-21 MED ORDER — SODIUM CHLORIDE 0.9 % IV BOLUS
1000.0000 mL | Freq: Once | INTRAVENOUS | Status: AC
  Administered 2019-09-21: 20:00:00 1000 mL via INTRAVENOUS

## 2019-09-21 NOTE — ED Provider Notes (Signed)
[facility] DEPT Provider Note   CSN: [id number] Arrival date & time: 09/21/19  1552     History   Chief Complaint Chief Complaint  Patient presents with  . Abnormal Lab    HPI Kristen Mahoney is a 73 y.o. female.  Visit was department with report of abnormal labs.  Patient had preop testing yesterday for a elective knee replacement and was told that her creatinine levels were elevated and needed to go to the hospital for IV fluids.  Patient states that she has no new complaints today and would not have otherwise come to the emergency department.  She says she has been able to eat and drink regularly without any difficulty.  Has not had any abdominal pain, flank pain, back pain, nausea or vomiting.  No recent fevers.  2 weeks ago patient came to the emergency department with gross hematuria, urinalysis at that time was concerning for UTI, CT scan at that time demonstrated severe hyper density in the urinary bladder with recommendation for cystoscopy.  CT scan also noted bilateral nephrolithiasis but no hydronephrosis or renal obstruction.    Patient reports that she followed up with urology who performed a cystoscopy, was told that she had a urinary tract infection but did not have a mass with cancer.  She was given a new antibiotic - cefpodoxime which she is currently in process of taking.      HPI  Past Medical History:  Diagnosis Date  . Allergy    seasonal  . Anxiety   . Arthritis    knees,all over  . Basal cell carcinoma   . Benign breast cyst in female    PATIENT HAS HISTORY OF BREAST CYSTS  . Cataract    beginning bilateral  . Diabetes mellitus   . GERD (gastroesophageal reflux disease)   . Herpes progenitalis   . Hyperlipidemia   . Hypertension   . Kidney stone   . OSA on CPAP   . Sleep apnea    cpap  . Urinary, incontinence, stress female   . Vertigo     Patient Active Problem List   Diagnosis Date Noted  . Osteoarthritis of  right knee 09/21/2019  . Weight loss 08/23/2017  . Rectal pain 08/23/2017  . Rectal bleeding 08/23/2017  . Incontinence of feces 07/14/2017  . HTN (hypertension), malignant 07/14/2017  . Anxiety and depression 07/14/2017  . Weight loss, unintentional 07/14/2017  . Gas pain 06/17/2017  . Bloating 06/17/2017  . 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 OSA on CPAP 07/13/2016  . Trichomonas vaginitis 08/12/2015  . Kidney stone   . DIARRHEA, INFECTIOUS 09/04/2009  . BLOOD IN STOOL-MELENA 09/04/2009  . Abdominal pain, left lower quadrant 09/04/2009  . BLOOD IN STOOL, OCCULT 09/04/2009  . HYPERLIPIDEMIA 12/25/2008  . ANXIETY 12/25/2008  . Reactive depression 12/25/2008  . GERD 12/25/2008  . HIATAL HERNIA 12/25/2008  . Diverticulosis of colon (without mention of hemorrhage) 12/25/2008  . IRRITABLE BOWEL SYNDROME 12/25/2008  . RECTOVAGINAL FISTULA 12/25/2008  . MITRAL VALVE PROLAPSE, HX OF 12/25/2008    Past Surgical History:  Procedure Laterality Date  . ABDOMINAL HYSTERECTOMY  1986   leiomyomata  . Basal cell excised    . breast cystectomy     right  . CHOLECYSTECTOMY  2003  . COLONOSCOPY    . ear cystectomy    . ESOPHAGOGASTRODUODENOSCOPY    . KNEE ARTHROSCOPY     right  . LITHOTRIPSY    . RECTOVAGINAL FISTULA CLOSURE  OB History    Gravida  3   Para  2   Term  2   Preterm      AB  1   Living  2     SAB  1   TAB      Ectopic      Multiple      Live Births               Home Medications    Prior to Admission medications   Medication Sig Start Date End Date Taking? Authorizing Provider  acetaminophen (TYLENOL) 500 MG tablet Take 1,000 mg by mouth every 6 (six) hours as needed (for pain.).    [provider]  aspirin EC 81 MG tablet Take 81 mg by mouth every evening.     [provider]  atorvastatin (LIPITOR) 20 MG tablet Take 20 mg by mouth daily.  02/14/17   [provider]  buPROPion (WELLBUTRIN XL) 150 MG 24 hr  tablet Take 150 mg by mouth daily. 08/08/19   [provider]  fluticasone (FLONASE) 50 MCG/ACT nasal spray Place 1 spray into both nostrils daily as needed for allergies.  04/02/19   [provider]  hydrocortisone 1 % ointment Apply 1 application topically 3 (three) times daily. Patient taking differently: Apply 1 application topically 3 (three) times daily as needed for itching.  09/26/18   Esterwood, Amy S, PA-C  hyoscyamine (LEVSIN SL) 0.125 MG SL tablet DISSOLVE 1 TABLET IN MOUTH EVERY 6 HOURS AS NEEDED FOR  CRAMPING Patient taking differently: Take 0.125 mg by mouth every 6 (six) hours as needed for cramping. DISSOLVE 1 TABLET IN MOUTH EVERY 6 HOURS AS NEEDED FOR  CRAMPING 02/26/19   Nandigam, Venia Minks, MD  loratadine (CLARITIN) 10 MG tablet Take 10 mg by mouth daily as needed for allergies.    [provider]  metFORMIN (GLUCOPHAGE-XR) 500 MG 24 hr tablet Take 500 mg by mouth 2 (two) times daily. 07/23/19   [provider]  metoprolol succinate (TOPROL XL) 50 MG 24 hr tablet Take 1 tablet (50 mg total) by mouth daily. Take with or immediately following a meal. 12/29/12   Mabe, Forbes Cellar, MD  Multiple Vitamins-Minerals (ICAPS) CAPS Take 1 capsule by mouth 2 (two) times daily.     [provider]  omeprazole (PRILOSEC) 20 MG capsule Take 1 capsule (20 mg total) by mouth 2 (two) times daily before a meal. 09/08/18   Nandigam, Venia Minks, MD  RESTASIS 0.05 % ophthalmic emulsion Place 1 drop into both eyes every 12 (twelve) hours. 06/28/19   [provider]  valACYclovir (VALTREX) 500 MG tablet Take one tablet twice daily for 3-5 days as needed with an outbreak, then daily as needed Patient taking differently: Take 500 mg by mouth 2 (two) times daily as needed (outbreak).  03/07/19   [name], NP  Vitamin D, Cholecalciferol, 400 units TABS Take 400 Units by mouth daily.     [provider]  vitamin E (VITAMIN E) 400 UNIT capsule Take 400 Units  by mouth 2 (two) times daily.     [provider]    Family History Family History  Problem Relation Age of Onset  . Diabetes Mother   . Diabetes Father   . Colon cancer Father   . Heart disease Father   . Hypertension Father   . Diabetes Sister   . Hypertension Sister   . Diabetes Brother   . Colon polyps  Brother   . Prostate cancer Brother   . Breast cancer Paternal Aunt        Age 51's  . Colon polyps Brother   . Heart disease Brother   . Stroke Brother   . Hyperlipidemia Brother   . Colon polyps Sister   . Diabetes Sister   . Hypertension Sister   . Heart disease Sister   . Esophageal cancer Neg Hx   . Rectal cancer Neg Hx   . Stomach cancer Neg Hx     Social History Social History   Tobacco Use  . Smoking status: Former Research scientist (life sciences)  . Smokeless tobacco: Never Used  Substance Use Topics  . Alcohol use: Yes    Comment: Rare  . Drug use: No     Allergies   Ciprofloxacin and Morphine and related   Review of Systems Review of Systems  Constitutional: Negative for chills and fever.  HENT: Negative for ear pain and sore throat.   Eyes: Negative for pain and visual disturbance.  Respiratory: Negative for cough and shortness of breath.   Cardiovascular: Negative for chest pain and palpitations.  Gastrointestinal: Negative for abdominal pain and vomiting.  Genitourinary: Negative for dysuria and hematuria.  Musculoskeletal: Negative for arthralgias and back pain.  Skin: Negative for color change and rash.  Neurological: Negative for seizures and syncope.  All other systems reviewed and are negative.    Physical Exam Updated Vital Signs BP (!) 151/89 (BP Location: Left Arm)   Pulse 91   Temp 99.1 F (37.3 C) (Oral)   Resp 18   SpO2 100%   Physical Exam Vitals signs and nursing note reviewed.  Constitutional:      General: She is not in acute distress.    Appearance: She is well-developed.  HENT:     Head: Normocephalic and atraumatic.   Eyes:     Conjunctiva/sclera: Conjunctivae normal.  Neck:     Musculoskeletal: Neck supple.  Cardiovascular:     Rate and Rhythm: Normal rate and regular rhythm.     Heart sounds: No murmur.  Pulmonary:     Effort: Pulmonary effort is normal. No respiratory distress.     Breath sounds: Normal breath sounds.  Abdominal:     Palpations: Abdomen is soft.     Tenderness: There is no abdominal tenderness.  Musculoskeletal:        General: No swelling or tenderness.  Skin:    General: Skin is warm and dry.     Capillary Refill: Capillary refill takes less than 2 seconds.  Neurological:     General: No focal deficit present.     Mental Status: She is alert and oriented to person, place, and time.  Psychiatric:        Mood and Affect: Mood normal.        Behavior: Behavior normal.      ED Treatments / Results  Labs (all labs ordered are listed, but only abnormal results are displayed) Labs Reviewed  COMPREHENSIVE METABOLIC PANEL - Abnormal; Notable for the following components:      Result Value   Sodium 146 (*)    Potassium 3.3 (*)    BUN 30 (*)    Creatinine, Ser 1.83 (*)    GFR calc non Af Amer 27 (*)    GFR calc Af Amer 31 (*)    All other components within normal limits  URINALYSIS, ROUTINE W REFLEX MICROSCOPIC - Abnormal; Notable for the following components:   APPearance CLOUDY (*)  Protein, ur 30 (*)    Leukocytes,Ua LARGE (*)    WBC, UA >50 (*)    Bacteria, UA RARE (*)    All other components within normal limits  URINE CULTURE    EKG None  Radiology Dg Chest 2 View  Result Date: 09/20/2019 CLINICAL DATA:  Preoperative evaluation for knee replacement surgery, history diabetes mellitus, hypertension EXAM: CHEST - 2 VIEW COMPARISON:  12/29/2012 FINDINGS: Normal heart size, mediastinal contours, and pulmonary vascularity. Lungs clear. No infiltrate, pleural effusion or pneumothorax. Osseous structures unremarkable. IMPRESSION: Normal exam. Electronically  Signed   By: Lavonia Dana M.D.   On: 09/20/2019 17:58    Procedures Procedures (including critical care time)  Medications Ordered in ED Medications  sodium chloride 0.9 % bolus 1,000 mL (1,000 mLs Intravenous New Bag/Given 09/21/19 2023)     Initial Impression / Assessment and Plan / ED Course  I have reviewed the triage vital signs and the nursing notes.  Pertinent labs & imaging results that were available during my care of the patient were reviewed by me and considered in my medical decision making (see chart for details).        73 year old lady who presented to the ER after creatinine was incidentally noted to be elevated preop testing yesterday.  Repeat labs today showed spontaneous improvement in creatinine level.  Patient is well-appearing with no symptoms today, no physical exam abnormalities were noted.  Of note she was recently diagnosed with urinary tract infection followed with urology.  I discussed need for repeat imaging (CT vs [place]) to rule out obstructive process, possibly admission for new acute kidney injury. Patient requesting IV fluids and discharge home. Given her creatinine is spontaneously improving, she has no symptoms and is tolerating PO without difficulty I feel this is a reasonable alternative. Her UA today looked that she may have infection. I recommended continuing the new abx from her urologist, stress improtance for recheck with her PCP and urology, stressed need for repeat BMP early next week and stressed return precautions to ER should she become symptomatic.  After the discussed management above, the patient was determined to be safe for discharge.  The patient was in agreement with this plan and all questions regarding their care were answered.  ED return precautions were discussed and the patient will return to the ED with any significant worsening of condition.    Final Clinical Impressions(s) / ED Diagnoses   Final diagnoses:  Acute kidney injury  [facility])    ED Discharge Orders    None       Lucrezia Starch, MD 09/22/19 1156

## 2019-09-21 NOTE — ED Notes (Signed)
This nurse attempted IV access twice. Referred to other nurse for assistance with IV access.

## 2019-09-21 NOTE — H&P (Signed)
TOTAL KNEE ADMISSION H&P  Patient is being admitted for right total knee arthroplasty.  Subjective:  Chief Complaint:right knee pain.  HPI: Kristen Mahoney, 73 y.o. female, has a history of pain and functional disability in the right knee due to arthritis and has failed non-surgical conservative treatments for greater than 12 weeks to includeNSAID's and/or analgesics, corticosteriod injections, viscosupplementation injections, use of assistive devices, weight reduction as appropriate and activity modification.  Onset of symptoms was gradual, starting several years ago with gradually worsening course since that time. The patient noted prior procedures on the knee to include  arthroscopy on the right knee(s).  Patient currently rates pain in the right knee(s) at 10 out of 10 with activity. Patient has night pain, worsening of pain with activity and weight bearing, pain that interferes with activities of daily living, pain with passive range of motion, crepitus and joint swelling.  Patient has evidence of joint space narrowing by imaging studies.  There is no active infection.  Patient Active Problem List   Diagnosis Date Noted  . Weight loss 08/23/2017  . Rectal pain 08/23/2017  . Rectal bleeding 08/23/2017  . Incontinence of feces 07/14/2017  . HTN (hypertension), malignant 07/14/2017  . Anxiety and depression 07/14/2017  . Weight loss, unintentional 07/14/2017  . Gas pain 06/17/2017  . Bloating 06/17/2017  . 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 OSA on CPAP 07/13/2016  . Trichomonas vaginitis 08/12/2015  . Kidney stone   . DIARRHEA, INFECTIOUS 09/04/2009  . BLOOD IN STOOL-MELENA 09/04/2009  . Abdominal pain, left lower quadrant 09/04/2009  . BLOOD IN STOOL, OCCULT 09/04/2009  . HYPERLIPIDEMIA 12/25/2008  . ANXIETY 12/25/2008  . Reactive depression 12/25/2008  . GERD 12/25/2008  . HIATAL HERNIA 12/25/2008  . Diverticulosis of colon (without mention of hemorrhage) 12/25/2008  . IRRITABLE  BOWEL SYNDROME 12/25/2008  . RECTOVAGINAL FISTULA 12/25/2008  . MITRAL VALVE PROLAPSE, HX OF 12/25/2008   Past Medical History:  Diagnosis Date  . Allergy    seasonal  . Anxiety   . Arthritis    knees,all over  . Basal cell carcinoma   . Benign breast cyst in female    PATIENT HAS HISTORY OF BREAST CYSTS  . Cataract    beginning bilateral  . Diabetes mellitus   . GERD (gastroesophageal reflux disease)   . Herpes progenitalis   . Hyperlipidemia   . Hypertension   . Kidney stone   . OSA on CPAP   . Sleep apnea    cpap  . Urinary, incontinence, stress female   . Vertigo     Past Surgical History:  Procedure Laterality Date  . ABDOMINAL HYSTERECTOMY  1986   leiomyomata  . Basal cell excised    . breast cystectomy     right  . CHOLECYSTECTOMY  2003  . COLONOSCOPY    . ear cystectomy    . ESOPHAGOGASTRODUODENOSCOPY    . KNEE ARTHROSCOPY     right  . LITHOTRIPSY    . RECTOVAGINAL FISTULA CLOSURE      Current Facility-Administered Medications  Medication Dose Route Frequency Provider Last Rate Last Dose  . 0.9 %  sodium chloride infusion  500 mL Intravenous Continuous Nandigam, Kavitha V, MD      . betamethasone acetate-betamethasone sodium phosphate (CELESTONE) injection 3 mg  3 mg Intramuscular Once Edrick Kins, DPM       Current Outpatient Medications  Medication Sig Dispense Refill Last Dose  . acetaminophen (TYLENOL) 500 MG tablet Take 1,000 mg by mouth  every 6 (six) hours as needed (for pain.).     [name] Kitchen aspirin EC 81 MG tablet Take 81 mg by mouth every evening.      [name] Kitchen atorvastatin (LIPITOR) 20 MG tablet Take 20 mg by mouth daily.      [name] Kitchen buPROPion (WELLBUTRIN XL) 150 MG 24 hr tablet Take 150 mg by mouth daily.     . fluticasone (FLONASE) 50 MCG/ACT nasal spray Place 1 spray into both nostrils daily as needed for allergies.      . hydrocortisone 1 % ointment Apply 1 application topically 3 (three) times daily. (Patient taking differently: Apply 1 application  topically 3 (three) times daily as needed for itching. ) 30 g 0   . hyoscyamine (LEVSIN SL) 0.125 MG SL tablet DISSOLVE 1 TABLET IN MOUTH EVERY 6 HOURS AS NEEDED FOR  CRAMPING (Patient taking differently: Take 0.125 mg by mouth every 6 (six) hours as needed for cramping. DISSOLVE 1 TABLET IN MOUTH EVERY 6 HOURS AS NEEDED FOR  CRAMPING) 270 tablet 1   . loratadine (CLARITIN) 10 MG tablet Take 10 mg by mouth daily as needed for allergies.     . metFORMIN (GLUCOPHAGE-XR) 500 MG 24 hr tablet Take 500 mg by mouth 2 (two) times daily.     . metoprolol succinate (TOPROL XL) 50 MG 24 hr tablet Take 1 tablet (50 mg total) by mouth daily. Take with or immediately following a meal. 30 tablet 0   . Multiple Vitamins-Minerals (ICAPS) CAPS Take 1 capsule by mouth 2 (two) times daily.      [name] Kitchen omeprazole (PRILOSEC) 20 MG capsule Take 1 capsule (20 mg total) by mouth 2 (two) times daily before a meal. 180 capsule 3   . RESTASIS 0.05 % ophthalmic emulsion Place 1 drop into both eyes every 12 (twelve) hours.     . valACYclovir (VALTREX) 500 MG tablet Take one tablet twice daily for 3-5 days as needed with an outbreak, then daily as needed (Patient taking differently: Take 500 mg by mouth 2 (two) times daily as needed (outbreak). ) 30 tablet 12   . Vitamin D, Cholecalciferol, 400 units TABS Take 400 Units by mouth daily.      . vitamin E (VITAMIN E) 400 UNIT capsule Take 400 Units by mouth 2 (two) times daily.       Allergies  Allergen Reactions  . Ciprofloxacin Rash  . Morphine And Related Itching    Social History   Tobacco Use  . Smoking status: Former Research scientist (life sciences)  . Smokeless tobacco: Never Used  Substance Use Topics  . Alcohol use: Yes    Comment: Rare    Family History  Problem Relation Age of Onset  . Diabetes Mother   . Diabetes Father   . Colon cancer Father   . Heart disease Father   . Hypertension Father   . Diabetes Sister   . Hypertension Sister   . Diabetes Brother   . Colon polyps Brother    . Prostate cancer Brother   . Breast cancer Paternal Aunt        Age 57's  . Colon polyps Brother   . Heart disease Brother   . Stroke Brother   . Hyperlipidemia Brother   . Colon polyps Sister   . Diabetes Sister   . Hypertension Sister   . Heart disease Sister   . Esophageal cancer Neg Hx   . Rectal cancer Neg Hx   . Stomach cancer Neg Hx  Review of Systems  Constitutional: Negative.   HENT: Negative.   Eyes:       Glasses  Respiratory: Negative.   Cardiovascular:       Htn  Gastrointestinal: Positive for blood in stool.  Genitourinary: Negative.   Musculoskeletal: Positive for joint pain.  Skin: Negative.   Neurological: Negative.   Endo/Heme/Allergies: Negative.   Psychiatric/Behavioral: Negative.     Objective:  Physical Exam  Constitutional: She is oriented to person, place, and time. She appears well-developed and well-nourished.  HENT:  Head: Normocephalic and atraumatic.  Eyes: Pupils are equal, round, and reactive to light.  Neck: Normal range of motion. Neck supple.  Cardiovascular: Intact distal pulses.  Respiratory: Effort normal.  Musculoskeletal:        General: Tenderness present.     Comments: Tender along the medial joint line of left greater than right knee the left knee has a 2+ effusion the right knee none collateral ligaments are stable the varus stress exacerbates pain to both knees.  Both knees have 5 flexion contractures the left knee flexes 120 the right knee 130  Neurological: She is alert and oriented to person, place, and time.  Skin: Skin is warm and dry.  Psychiatric: She has a normal mood and affect. Her behavior is normal. Judgment and thought content normal.    Vital signs in last 24 hours: Temp:  [98.5 F (36.9 C)] 98.5 F (36.9 C) (09/24 1139) Pulse Rate:  [83] 83 (09/24 1139) Resp:  [18] 18 (09/24 1139) BP: (160)/(75) 160/75 (09/24 1139) SpO2:  [100 %] 100 % (09/24 1139)  Labs:   Estimated body mass index is  24.51 kg/m as calculated from the following:   Height as of 09/07/19: 5\' 2"  (1.575 m).   Weight as of 09/07/19: 60.8 kg.   Imaging Review Plain radiographs demonstrate AP Rosenberg sunrise and lateral views of both knees now show near bone-on-bone arthritis on the left 1 mm of cartilage remaining in the right bone-on-bone with early erosion of the tibial plateau there has been progression.      Assessment/Plan:  End stage arthritis, right knee   The patient history, physical examination, clinical judgment of the provider and imaging studies are consistent with end stage degenerative joint disease of the right knee(s) and total knee arthroplasty is deemed medically necessary. The treatment options including medical management, injection therapy arthroscopy and arthroplasty were discussed at length. The risks and benefits of total knee arthroplasty were presented and reviewed. The risks due to aseptic loosening, infection, stiffness, patella tracking problems, thromboembolic complications and other imponderables were discussed. The patient acknowledged the explanation, agreed to proceed with the plan and consent was signed. Patient is being admitted for inpatient treatment for surgery, pain control, PT, OT, prophylactic antibiotics, VTE prophylaxis, progressive ambulation and ADL's and discharge planning. The patient is planning to be discharged home with home health services     Patient's anticipated LOS is less than 2 midnights, meeting these requirements: - Younger than 38 - Lives within 1 hour of care - Has a competent adult at home to recover with post-op recover - NO history of  - Chronic pain requiring opiods  - Coronary Artery Disease  - Heart failure  - Heart attack  - Stroke  - DVT/VTE  - Cardiac arrhythmia  - Respiratory Failure/COPD  - Renal failure  - Anemia  - Advanced Liver disease

## 2019-09-21 NOTE — Discharge Instructions (Signed)
Continue taking antibiotic as prescribed by urology.  Please call your primary doctor on Monday to schedule a recheck and to have your blood work repeated.  Please take fluids over the weekend as discussed.  If you feel that you have any abdominal pain, back pain, vomiting or fevers please return to ER for reassessment.

## 2019-09-21 NOTE — ED Triage Notes (Signed)
Pt reports did pre-op testing yesterday for knee replacement surgery done, was called and told Kidney function levels were high and to go to ED for IV fluids.

## 2019-09-21 NOTE — Progress Notes (Signed)
Anesthesia Chart Review   Case: [id number] Date/Time: 09/24/19 1106   Procedure: LEFT TOTAL KNEE ARTHROPLASTY (Left Knee)   Anesthesia type: Spinal   Pre-op diagnosis: LEFT KNEE OSTEOARTHRITIS   Location: [facility]   Surgeon: Frederik Pear, MD      DISCUSSION:72 y.o. former smoker with h/o DM, HTN, OSA on CPAP, GERD, HLD, vertigo, left knee OA scheduled for above procedure 09/24/2019 with Dr. Frederik Pear.   Clearance from Dr. Terrence Dupont on chart.   Elevated Creatinine of 2.43 at PAT visit 09/20/2019.  It appears baseline is 0.90.  No diagnosed kidney disease.  PCP contacted to address.  Voicemail left with Dr. Damita Dunnings office.    VS: BP (!) 160/75   Pulse 83   Temp 36.9 C (Oral)   Resp 18   SpO2 100%   PROVIDERS: Marda Stalker, PA-C is PCP  Terrence Dupont, MD is Cardiologist  LABS: PCP contacted regarding creatinine (all labs ordered are listed, but only abnormal results are displayed)  Labs Reviewed  BASIC METABOLIC PANEL - Abnormal; Notable for the following components:      Result Value   Chloride 112 (*)    Glucose, Bld 105 (*)    BUN 30 (*)    Creatinine, Ser 2.43 (*)    GFR calc non Af Amer 19 (*)    GFR calc Af Amer 22 (*)    All other components within normal limits  HEMOGLOBIN A1C - Abnormal; Notable for the following components:   Hgb A1c MFr Bld 6.2 (*)    All other components within normal limits  URINALYSIS, ROUTINE W REFLEX MICROSCOPIC - Abnormal; Notable for the following components:   Protein, ur 30 (*)    Leukocytes,Ua LARGE (*)    Bacteria, UA RARE (*)    All other components within normal limits  GLUCOSE, CAPILLARY - Abnormal; Notable for the following components:   Glucose-Capillary 119 (*)    All other components within normal limits  SURGICAL PCR SCREEN  APTT  CBC WITH DIFFERENTIAL/PLATELET  PROTIME-INR  TYPE AND SCREEN  ABO/RH     IMAGES: Chest Xray 09/20/2019 FINDINGS: Normal heart size, mediastinal contours, and pulmonary  vascularity.  Lungs clear.  No infiltrate, pleural effusion or pneumothorax.  Osseous structures unremarkable.  IMPRESSION: Normal exam.  EKG: 09/20/2019 Rate 84 bpm Normal sinus rhythm  Normal ECG No significant change since last tracing   CV:  Past Medical History:  Diagnosis Date  . Allergy    seasonal  . Anxiety   . Arthritis    knees,all over  . Basal cell carcinoma   . Benign breast cyst in female    PATIENT HAS HISTORY OF BREAST CYSTS  . Cataract    beginning bilateral  . Diabetes mellitus   . GERD (gastroesophageal reflux disease)   . Herpes progenitalis   . Hyperlipidemia   . Hypertension   . Kidney stone   . OSA on CPAP   . Sleep apnea    cpap  . Urinary, incontinence, stress female   . Vertigo     Past Surgical History:  Procedure Laterality Date  . ABDOMINAL HYSTERECTOMY  1986   leiomyomata  . Basal cell excised    . breast cystectomy     right  . CHOLECYSTECTOMY  2003  . COLONOSCOPY    . ear cystectomy    . ESOPHAGOGASTRODUODENOSCOPY    . KNEE ARTHROSCOPY     right  . LITHOTRIPSY    . RECTOVAGINAL FISTULA CLOSURE  MEDICATIONS: . acetaminophen (TYLENOL) 500 MG tablet  . aspirin EC 81 MG tablet  . atorvastatin (LIPITOR) 20 MG tablet  . buPROPion (WELLBUTRIN XL) 150 MG 24 hr tablet  . fluticasone (FLONASE) 50 MCG/ACT nasal spray  . hydrocortisone 1 % ointment  . hyoscyamine (LEVSIN SL) 0.125 MG SL tablet  . loratadine (CLARITIN) 10 MG tablet  . metFORMIN (GLUCOPHAGE-XR) 500 MG 24 hr tablet  . metoprolol succinate (TOPROL XL) 50 MG 24 hr tablet  . Multiple Vitamins-Minerals (ICAPS) CAPS  . omeprazole (PRILOSEC) 20 MG capsule  . RESTASIS 0.05 % ophthalmic emulsion  . valACYclovir (VALTREX) 500 MG tablet  . Vitamin D, Cholecalciferol, 400 units TABS  . vitamin E (VITAMIN E) 400 UNIT capsule   . 0.9 %  sodium chloride infusion  . betamethasone acetate-betamethasone sodium phosphate (CELESTONE) injection 3 mg   [facility] Pre-Surgical Testing [phone] 09/21/19  11:19 AM

## 2019-09-23 LAB — URINE CULTURE: Culture: NO GROWTH

## 2019-09-24 ENCOUNTER — Ambulatory Visit: Admit: 2019-09-24 | Payer: Medicare Other | Admitting: Orthopedic Surgery

## 2019-09-24 ENCOUNTER — Ambulatory Visit (HOSPITAL_COMMUNITY): Admission: RE | Admit: 2019-09-24 | Payer: Medicare Other | Source: Home / Self Care | Admitting: Orthopedic Surgery

## 2019-09-24 ENCOUNTER — Encounter (HOSPITAL_COMMUNITY): Admission: RE | Payer: Self-pay | Source: Home / Self Care

## 2019-09-24 DIAGNOSIS — R899 Unspecified abnormal finding in specimens from other organs, systems and tissues: Secondary | ICD-10-CM | POA: Diagnosis not present

## 2019-09-24 LAB — TYPE AND SCREEN
ABO/RH(D): A POS
Antibody Screen: NEGATIVE

## 2019-09-24 SURGERY — ARTHROPLASTY, KNEE, TOTAL
Anesthesia: Spinal | Site: Knee | Laterality: Left

## 2019-09-27 DIAGNOSIS — Z23 Encounter for immunization: Secondary | ICD-10-CM | POA: Diagnosis not present

## 2019-09-27 DIAGNOSIS — M79641 Pain in right hand: Secondary | ICD-10-CM | POA: Diagnosis not present

## 2019-09-27 DIAGNOSIS — M79642 Pain in left hand: Secondary | ICD-10-CM | POA: Diagnosis not present

## 2019-09-27 DIAGNOSIS — N289 Disorder of kidney and ureter, unspecified: Secondary | ICD-10-CM | POA: Diagnosis not present

## 2019-10-01 DIAGNOSIS — Z23 Encounter for immunization: Secondary | ICD-10-CM | POA: Diagnosis not present

## 2019-10-01 DIAGNOSIS — N289 Disorder of kidney and ureter, unspecified: Secondary | ICD-10-CM | POA: Diagnosis not present

## 2019-10-04 DIAGNOSIS — N3021 Other chronic cystitis with hematuria: Secondary | ICD-10-CM | POA: Diagnosis not present

## 2019-10-18 DIAGNOSIS — N3021 Other chronic cystitis with hematuria: Secondary | ICD-10-CM | POA: Diagnosis not present

## 2019-10-23 DIAGNOSIS — N3021 Other chronic cystitis with hematuria: Secondary | ICD-10-CM | POA: Diagnosis not present

## 2019-11-03 DIAGNOSIS — E119 Type 2 diabetes mellitus without complications: Secondary | ICD-10-CM | POA: Diagnosis not present

## 2019-11-06 ENCOUNTER — Ambulatory Visit (INDEPENDENT_AMBULATORY_CARE_PROVIDER_SITE_OTHER): Payer: Medicare Other | Admitting: Nurse Practitioner

## 2019-11-06 ENCOUNTER — Other Ambulatory Visit: Payer: Self-pay

## 2019-11-06 ENCOUNTER — Other Ambulatory Visit (INDEPENDENT_AMBULATORY_CARE_PROVIDER_SITE_OTHER): Payer: Medicare Other

## 2019-11-06 ENCOUNTER — Ambulatory Visit (INDEPENDENT_AMBULATORY_CARE_PROVIDER_SITE_OTHER): Payer: Medicare Other | Admitting: Neurology

## 2019-11-06 ENCOUNTER — Encounter: Payer: Self-pay | Admitting: Nurse Practitioner

## 2019-11-06 ENCOUNTER — Encounter: Payer: Self-pay | Admitting: Neurology

## 2019-11-06 VITALS — BP 155/84 | HR 91 | Temp 97.5°F | Ht 62.0 in | Wt 133.0 lb

## 2019-11-06 VITALS — BP 150/80 | HR 84 | Temp 97.3°F | Ht 62.0 in | Wt 133.0 lb

## 2019-11-06 DIAGNOSIS — K644 Residual hemorrhoidal skin tags: Secondary | ICD-10-CM | POA: Diagnosis not present

## 2019-11-06 DIAGNOSIS — K219 Gastro-esophageal reflux disease without esophagitis: Secondary | ICD-10-CM | POA: Diagnosis not present

## 2019-11-06 DIAGNOSIS — K623 Rectal prolapse: Secondary | ICD-10-CM | POA: Diagnosis not present

## 2019-11-06 DIAGNOSIS — K921 Melena: Secondary | ICD-10-CM | POA: Diagnosis not present

## 2019-11-06 DIAGNOSIS — G4733 Obstructive sleep apnea (adult) (pediatric): Secondary | ICD-10-CM | POA: Diagnosis not present

## 2019-11-06 DIAGNOSIS — G4750 Parasomnia, unspecified: Secondary | ICD-10-CM

## 2019-11-06 DIAGNOSIS — N179 Acute kidney failure, unspecified: Secondary | ICD-10-CM

## 2019-11-06 DIAGNOSIS — Z9989 Dependence on other enabling machines and devices: Secondary | ICD-10-CM

## 2019-11-06 DIAGNOSIS — N1832 Chronic kidney disease, stage 3b: Secondary | ICD-10-CM

## 2019-11-06 LAB — CBC
HCT: 36.5 % (ref 36.0–46.0)
Hemoglobin: 12.2 g/dL (ref 12.0–15.0)
MCHC: 33.5 g/dL (ref 30.0–36.0)
MCV: 92 fl (ref 78.0–100.0)
Platelets: 198 10*3/uL (ref 150.0–400.0)
RBC: 3.97 Mil/uL (ref 3.87–5.11)
RDW: 15.2 % (ref 11.5–15.5)
WBC: 7.1 10*3/uL (ref 4.0–10.5)

## 2019-11-06 LAB — BASIC METABOLIC PANEL
BUN: 12 mg/dL (ref 6–23)
CO2: 31 mEq/L (ref 19–32)
Calcium: 9 mg/dL (ref 8.4–10.5)
Chloride: 107 mEq/L (ref 96–112)
Creatinine, Ser: 0.75 mg/dL (ref 0.40–1.20)
GFR: 91.43 mL/min (ref >60.00)
Glucose, Bld: 88 mg/dL (ref 70–99)
Potassium: 3.2 mEq/L — ABNORMAL LOW (ref 3.5–5.1)
Sodium: 145 mEq/L (ref 135–145)

## 2019-11-06 NOTE — Patient Instructions (Signed)

## 2019-11-06 NOTE — Progress Notes (Addendum)
SLEEP MEDICINE CLINIC   Provider:  Larey Seat, M D  Referring Provider: No ref. provider found Primary Care Physician:  Marda Stalker, PA-C  Chief Complaint  Patient presents with   Follow-up    pt alone, rm 11. pt states that she was supposed to have knee surgery but it was cancelled due to kidney function. she still has episodes of dizziness and complains this is mostly on the left side of head. set up date for machine was 2014 pt is eligible for a new machine.   Other    pt brought her card for the machine but last data reads 07/2018. advised the patient that the card may not have been all the way. pt advised that she should bring the whole  machine in and i will then put her card in and see if it updates. DME [employer]    HPI: I have pleasure of seeing Mrs. Kristen Mahoney today who has been a CPAP user since 2008 when she was originally diagnosed with obstructive sleep apnea at an AHI of 29.8. She achieved a lot of weight loss and was for this reason placed on an auto titrating. Last year the 95%t percentile pressure was 12.5 cm water and this year it is 10.8 cm water. This correlates with further weight loss, 23 pounds.  A residual AHI is 3.2 but the majority is central in nature, she continues on her AutoSet was 1 cm EPR she has used to machine 28 out of 30 days but only 18 days for longer than 4 hours. Her sleep has been more fragmented since she is depressed and she wakes up in the middle of the night having removed the mask. She has a dry mouth . This was not all intentional weight loss, some of this may be attributed to depression. She felt severely depressed she had finally weaned off alprazolam, and she was started on an SSRI, namely Zoloft. She still has a feeling of in a unrest and jitteriness, anxiety and she is excessively daytime sleepy. All this fits depression. Her symptoms have gotten better since she started Zoloft but they have not been yet controlled. I think it may  help if she also exercises daily. She has rectal-stool incontinence which makes it hard for her to swim. Epworth 6 points, FSS 20 points.   I have not encountered Mrs. Pak and a little over 3 years. The patient is here as a new patient as-established patient revisit. She has seen me in the past for her sleep disorder which is obstructive sleep apnea. Her primary care physician is now [place]. She needs new CPAP supplies and ongoing follow-up and for this reason brought his CPAP machine here today. As I have seen Mrs. Jersey last she has become a grandmother her grandson just celebrated his second birthday. She is a mother of 2 adult children. She is retired. He is also followed by orthopedist Dr. Elesa Massed and by cardiologist Dr. Terrence Dupont . She has diabetes which has been well controlled as long as she was able to exercise and walk. Due to knee pain she has been less able to exercise. She has no medication side effects that she reports at this time she feels extraordinarily well but she needs new supplies.  Mrs. Mele's sleep study from 09-05-2007 revealed an AHI of 29.8, supine AHI of 47.4 and nonsupine of 11.1 REM AHI of 32. Periodic leg movements for a few was an arousal index of 3.6 she was snoring. She  was titrated to CPAP but then placed on an auto titrator as she achieved weight loss.  I was able to review the patient's last laboratory results her hemoglobin A1c was elevated after receiving cortisone shots into the knee at 7.7. This has been higher than usual for her. No cause was 160s 3. Cholesterol is controlled. Also she has a discrepancy between LDL and HDL. [place] once to make sure that the patient is followed for her CPAP and compliance.  Her sleep habits are described as follows, she usually goes to bed late and watches TV usually a movie at about 2 AM before finally going to sleep. Fall asleep in front of the TV. The TV in the bedroom. She stated that her husband rarely disturbs her sleep  and he may occasionally snore. But she is moving restlessly. She may have one bathroom break at night but usually nocturia does not fragment his sleep. And she rises in the morning at 10 AM. Today's an unusual early day for her now in retirement. She may drink some caffeine in the morning. She has breakfast at home. She is trying to exercise by walking.  She had a CPAP first in 2014.  Interval history from 07/13/2016. Mrs. Santini's apnea has now been well controlled by using an AutoSet between 5 and 15 cm water was 1 cm EPR, the 95th percentile pressure is 12.5 cm her apnea index is 5.0 there still 3.5 obstructive apneas per hour but these are negligible. She is using her machine 7 hours 1 minute each day on average and has a 97% compliance for over 4 hours of use. Based on these data I will see the patient again in one year. Her Epworth sleepiness score today was endorsed at 7 and her fatigue severity at 15 points. There is no evidence of clinical depression.  07-17-2018 , SYLWIA ODEM is a 73 y.o. female  is seen here as a  revisit  from Dr. Radene Ou for sleep medicine evaluation, CPAP compliance. I have followed and [name] since 2008 when I first diagnosed her with sleep apnea.  She has become 1 of my most compliant long-term CPAP users, continuing to use her machine at 97% compliance 29 out of 30 days with an average use at time of 5 hours 48 minutes.  The machine is an AutoSet relatively new with a pressure window between 5 and 15 cmH2O 1 cm EPR and a residual AHI of only 2.7.  She does have some central apneas emerging and for this reason we have not raise the pressure any further, the 95th percentile pressure is 10.8 cmH2O.  I see no reason to adjust her pressures she is doing well with the current settings and I will encourage her to use her CPAP as much as she possibly can especially when she travels and also when she takes naps.  She has benefited from CPAP therapy her Epworth sleepiness score has  been continuously under 10 points and is today again endorsed at 7 points, fatigue severity was 15 points.  There is also no evidence of clinical depression also she conveyed that she was a little overwhelmed and emotional as she had to arrange the memorial services for 2 sorority sisters without family of their own. She is awaiting cataract surgery.    RCV on 11-06-2019:   " I am still not sleeping- waking too early, independent of CPAP use.  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 she still has episodes of dizziness and complains this is mostly on the left side of head. set up date for machine was 2014 pt is eligible for a new machine. Download was not possible -   PS : she feels better since watching her hydration.    Review of Systems: Out of a complete 14 system review, the patient complains of only the following symptoms, and all other reviewed systems are negative. Big toe pain,   The patient has transiently suffered from higher blood pressures on higher blood glucose levels due to the steroid treatment of her knee pain. She further has reported snoring at times. Vision joint pain aching muscles and diarrhea and dizziness. She reports rectal bleeding.  Urinary bleeding- diagnosed with dehydration , urine is cloudy-   The patient still socially drinks some alcohol she drinks her coffee or caffeinated sodas occasionally but certainly not daily she is a retired Sales executive. She lives with her husband. Nausea, jitteriness, bad taste in the mouth, drop in CPAP -. intentional weight loss. Thyroid normal. grief reaction.  She enjoyed her journey to [place].   How likely are you to doze in the following situations: 0 = not likely, 1 = slight chance, 2 = moderate chance, 3 = high chance  Sitting and Reading? Watching Television? Sitting inactive in a  public place (theater or meeting)? Lying down in the afternoon when circumstances permit? Sitting and talking to someone? Sitting quietly after lunch without alcohol? In a car, while stopped for a few minutes in traffic? As a passenger in a car for an hour without a break? / 24  Total = 5  [employer].     Ref Range & Units 29mo ago (09/20/19) 71mo ago (09/07/19) 69yr ago (10/18/18) 76yr ago (06/19/18)  Color, Urine YELLOW YELLOW  REDAbnormal  CM  DARK YELLOW  YELLOW   APPearance CLEAR CLEAR  TURBIDAbnormal   CLOUDYAbnormal   CLOUDYAbnormal    Specific Gravity, Urine 1.005 - 1.030 1.012  TEST NOT REPORTED DUE TO COLOR INTERFERENCE OF URINE PIGMENT  1.025 R, CM  1.020 R   pH 5.0 - 8.0 5.0  TEST NOT REPORTED DUE TO COLOR INTERFERENCE OF URINE PIGMENT      Leukocytes,Ua NEGATIVE LARGEAbnormal   LARGEAbnormal   TEST NOT REPORTED DUE TO COLOR INTERFERENCE OF URINE PIGMENTAbnormal     RBC / HPF 0 - 5 RBC/hpf 11-20  21-50  >50High   10-20Abnormal  R   WBC, UA 0 - 5 WBC/hpf >50High   21-50   > OR = 60Abnormal  R   Bacteria, UA NONE SEEN RAREAbnormal   RAREAbnormal   NONE SEEN CM  MANYAbnormal  R   Squamous Epithelial / LPF 0 - 5 6-10  0-5   10-20Abnormal  R   Comment: Performed at [facility], [place] [address] [place], [place] [address]  Mucus   PRESENT CM     Nitrites, Initial     NEGATIVE R   Leukocyte Esterase     3+Abnormal  R   Hyaline Cast     NONE SEEN R   Urine-Other      CM   Note      CM   Resulting Agency  [facility]      Specimen Collected: 09/21/19 20:17 Last Resulted: 09/21/19 21:22  Social History   Socioeconomic History   Marital status: Married    Spouse name: Not on file   Number of children: 2   Years of education: Not on file   Highest education level: Not on file  Occupational History   Occupation: Retired    Fish farm manager: RETIRED  Scientist, product/process development strain: Not on file   Food  insecurity    Worry: Not on file    Inability: Not on file   Transportation needs    Medical: Not on file    Non-medical: Not on file  Tobacco Use   Smoking status: Former Smoker   Smokeless tobacco: Never Used  Substance and Sexual Activity   Alcohol use: Yes    Comment: Rare   Drug use: No   Sexual activity: Yes    Birth control/protection: Surgical  Lifestyle   Physical activity    Days per week: Not on file    Minutes per session: Not on file   Stress: Not on file  Relationships   Social connections    Talks on phone: Not on file    Gets together: Not on file    Attends religious service: Not on file    Active member of club or organization: Not on file    Attends meetings of clubs or organizations: Not on file    Relationship status: Not on file   Intimate partner violence    Fear of current or ex partner: Not on file    Emotionally abused: Not on file    Physically abused: Not on file    Forced sexual activity: Not on file  Other Topics Concern   Not on file  Social History Narrative   Denies caffeine use.    Family History  Problem Relation Age of Onset   Diabetes Mother    Diabetes Father    Colon cancer Father    Heart disease Father    Hypertension Father    Diabetes Sister    Hypertension Sister    Diabetes Brother    Colon polyps Brother    Prostate cancer Brother    Breast cancer Paternal 35        Age 70's   Colon polyps Brother    Heart disease Brother    Stroke Brother    Hyperlipidemia Brother    Colon polyps Sister    Diabetes Sister    Hypertension Sister    Heart disease Sister    Esophageal cancer Neg Hx    Rectal cancer Neg Hx    Stomach cancer Neg Hx     Past Medical History:  Diagnosis Date   Allergy    seasonal   Anxiety    Arthritis    knees,all over   Basal cell carcinoma    Benign breast cyst in female    PATIENT HAS HISTORY OF BREAST CYSTS   Cataract    beginning  bilateral   Diabetes mellitus    GERD (gastroesophageal reflux disease)    Herpes progenitalis    Hyperlipidemia    Hypertension    Kidney stone    OSA on CPAP    Sleep apnea    cpap   Urinary, incontinence, stress female    Vertigo     Past Surgical History:  Procedure Laterality Date   ABDOMINAL HYSTERECTOMY  1986   leiomyomata   Basal cell excised     breast cystectomy     right   CHOLECYSTECTOMY  2003  COLONOSCOPY     ear cystectomy     ESOPHAGOGASTRODUODENOSCOPY     KNEE ARTHROSCOPY     right   LITHOTRIPSY     RECTOVAGINAL FISTULA CLOSURE      Current Outpatient Medications  Medication Sig Dispense Refill   acetaminophen (TYLENOL) 500 MG tablet Take 1,000 mg by mouth every 6 (six) hours as needed (for pain.).     aspirin EC 81 MG tablet Take 81 mg by mouth every evening.      atorvastatin (LIPITOR) 20 MG tablet Take 20 mg by mouth daily.      buPROPion (WELLBUTRIN XL) 150 MG 24 hr tablet Take 150 mg by mouth daily.     fluticasone (FLONASE) 50 MCG/ACT nasal spray Place 1 spray into both nostrils daily as needed for allergies.      hydrocortisone 1 % ointment Apply 1 application topically 3 (three) times daily. (Patient taking differently: Apply 1 application topically 3 (three) times daily as needed for itching. ) 30 g 0   hyoscyamine (LEVSIN SL) 0.125 MG SL tablet DISSOLVE 1 TABLET IN MOUTH EVERY 6 HOURS AS NEEDED FOR  CRAMPING (Patient taking differently: Take 0.125 mg by mouth every 6 (six) hours as needed for cramping. DISSOLVE 1 TABLET IN MOUTH EVERY 6 HOURS AS NEEDED FOR  CRAMPING) 270 tablet 1   loratadine (CLARITIN) 10 MG tablet Take 10 mg by mouth daily as needed for allergies.     metFORMIN (GLUCOPHAGE-XR) 500 MG 24 hr tablet Take 500 mg by mouth 2 (two) times daily.     metoprolol succinate (TOPROL XL) 50 MG 24 hr tablet Take 1 tablet (50 mg total) by mouth daily. Take with or immediately following a meal. 30 tablet 0    Multiple Vitamins-Minerals (ICAPS) CAPS Take 1 capsule by mouth 2 (two) times daily.      omeprazole (PRILOSEC) 20 MG capsule Take 1 capsule (20 mg total) by mouth 2 (two) times daily before a meal. 180 capsule 3   RESTASIS 0.05 % ophthalmic emulsion Place 1 drop into both eyes every 12 (twelve) hours.     valACYclovir (VALTREX) 500 MG tablet Take one tablet twice daily for 3-5 days as needed with an outbreak, then daily as needed (Patient taking differently: Take 500 mg by mouth 2 (two) times daily as needed (outbreak). ) 30 tablet 12   Vitamin D, Cholecalciferol, 400 units TABS Take 400 Units by mouth daily.      vitamin E (VITAMIN E) 400 UNIT capsule Take 400 Units by mouth 2 (two) times daily.      Current Facility-Administered Medications  Medication Dose Route Frequency Provider Last Rate Last Dose   0.9 %  sodium chloride infusion  500 mL Intravenous Continuous Nandigam, Venia Minks, MD       betamethasone acetate-betamethasone sodium phosphate (CELESTONE) injection 3 mg  3 mg Intramuscular Once Edrick Kins, DPM        Allergies as of 11/06/2019 - Review Complete 11/06/2019  Allergen Reaction Noted   Ciprofloxacin Rash 09/04/2009   Morphine and related Itching 05/07/2011    Vitals: BP (!) 155/84    Pulse 91    Temp (!) 97.5 F (36.4 C)    Ht 5\' 2"  (1.575 m)    Wt 133 lb (60.3 kg)    BMI 24.33 kg/m  Last Weight:  Wt Readings from Last 1 Encounters:  11/06/19 133 lb (60.3 kg)       Last Height:   Ht Readings from Last  1 Encounters:  11/06/19 5\' 2"  (1.575 m)    Physical exam:  General: The patient is awake, alert and appears not in acute distress. The patient is well groomed. Head: Normocephalic, atraumatic. Neck is supple. Mallampati 3   neck circumference: 14 inches. Nasal airflow ; allergies affected. TMJ is not evident . Retrognathia is seen.  Cardiovascular:  Regular rate and rhythm without murmurs or carotid bruit, and without distended neck  veins. Respiratory: Lungs are clear to auscultation. Skin:  Without evidence of edema, or rash.  Trunk: BMI is elevated and patient  has normal posture. Speech is fluent without dysarthria, dysphonia or aphasia. Mood and affect are a little depressed.  Cranial nerves:   Pupils are equal in size. Funduscopic exam without evidence of pallor or edema. Extraocular movements  in vertical and horizontal planes intact and without nystagmus. Visual fields  intact.Hearing to finger rub intact.   Facial sensation intact to fine touch. Facial motor strength is symmetric and tongue and uvula move midline. Motor exam:  Normal tone ,muscle bulk and symmetric strength in all extremities. Assessment:  After physical and neurologic examination, review of laboratory studies, imaging, neurophysiology testing and pre-existing records, assessment is :   Possible spinal stenosis , hyperreflexia in both knees , in spite of diabetes. Weakness when rising , needs to brace herself.  The patient was advised of the nature of the diagnosed sleep disorder , the treatment options and risks for general a health and wellness arising from not treating the condition. Visit duration was 30 minutes.   Plan:  Treatment plan and additional workup :  I will prescribe a repeat sleep study - she needs a HST due to her " hairpiece" not being EEG compatible.- to define the level of apnea and co-morbidities.   Rv in 3 month with new CPAP, and with NP, we will alternate visits yearly.      Addendum: 11-07-2019, I am dictating the CPAP auto titrator compliance report for Nishita Veth, encompassing the dates from 12 October through 06 November 2019.   The patient has used her CPAP on 27 out of 30 days, but on 9 days was not able to fulfill the 4-hour minimum requirement.   Average user time is just 4-1/2 hours.   She still using an S9 AutoSet which will be replaced soon currently with a minimum pressure of 5 and a maximum pressure of 15 and  an EPR level of 1 AHI is 3.8 this to be related to central apneas there is not much air leak actually noted this is a moderate degree of air leak with a 95th percentile of 21.2 L/min.  The 95th percentile pressure is 11.4 cmH2O and is well covered with in the current pressure window.  Her compliance is only 60% and has to improve.    I know that her machine is due for replacement but it seems at this time still to work well and she could keep it as a second machine when she travels for example.    In addition I think [facility] for forwarding her recent lab reports to me the patient had been concerned about a acute renal failure earlier this year she had repeat blood tests drawn on 5 October and they show her glomerular filtration filtration rate has recovered to 60 her sodium level was high at 149 and has been in past tests as well she had hypokalemia with 3.2 L millimole her creatinine was 4.91 which for a lady  of biracial descent is normal.  Calcium was 8.1 slightly lower CO2 had risen to 31 mmol/L but is still within normal range.  At this time it is advisable for her to keep up the high fluid intake the best to drink water.  HbA1c was last tested in August and was 6.5 clearly diabetic level,Trinidad Petron, MD      Larey Seat MD  11/06/2019

## 2019-11-06 NOTE — Patient Instructions (Addendum)
Your provider has requested that you go to the basement level for lab work before leaving today. Press "B" on the elevator. The lab is located at the first door on the left as you exit the elevator.  You will be contacted by [facility] to schedule a consultation.  Apply Vaseline to the anal area and push the rectal tissue back into the rectum.  Call if the rectal prolapse recurs  Please follow up with Dr. Silverio Decamp around the middle of January

## 2019-11-06 NOTE — Progress Notes (Signed)
11/06/2019 TASHEEMA ANGLADA [id number] 1946/11/25   History of Present Illness: Kristen Mahoney is a 73 year old female with a past medical history of  arthritis, hypertension, hyperlipidemia, diabetes mellitus type 2 diagnosed 15 years ago, kidney stones, hematuria 09/07/2019 with a recent cystoscopy followed by urologist Dr. Alyson Ingles, obstructive sleep apnea on Cpap, diverticulitis, and GERD. S/P hysterectomy 25 years ago. S/P vaginal fistula surgery at [facility] in 2010.  She presents today for further evaluation regarding rectal bleeding.  Approximately 1 month ago she thought she needed to have a bowel movement and passed only bright red blood from the rectum.  No associated rectal or abdominal pain.  She denied having any prior constipation or diarrhea.  She typically passes a normal formed brown bowel movement most days.  She had a second episode of passing only bright red blood from the rectum 1 week ago.  She denies having any fever, sweats or chills.  She lost 5 pounds a few months ago but is gained this weight back.  She underwent a colonoscopy October 28 which identified a incomplete sphincter, a small rectal vault with partial rectal prolapse and diverticulosis.  She underwent a preop physical for a planned knee replacement surgery 09/20/2019, laboratory results identified an elevated creatinine level of 2.43. She was directed by her urologist to go to the emergency room for further evaluation.  Repeat labs 9/25 showed a creatinine level 1.83 after receiving IV fluids and she was discharged home.  Her knee replacement surgery was postponed.  A repeat BMP has not yet been done.  Labs 09/21/2019: Na 146. K 3.3. BUN 30. Cr. 1.83. AP 67.  AST 15.  ALT 10. Labs 09/20/2019: WBC 7.2.  Hemoglobin 12.8.  Hematocrit 39.6.  Platelet 226.  BUN 30.  Creatinine 2.43.  Potassium 4.2.  Abdominal/pelvic CT 09/07/2019  Bilateral nephrolithiasis is noted. No hydronephrosis or renal obstruction is noted. Linear  hyperdensity is noted posteriorly in the urinary bladder which most likely represents blood, but possible mass cannot be excluded. Cystoscopy is recommended for further evaluation. Aortic Atherosclerosis   Colonoscopy 10/12/2017 by Dr. Silverio Decamp: Decreased sphincter tone found on digital rectal exam. - Diverticulosis in the sigmoid colon and in the descending colon. - The examination was otherwise normal. - No specimens collected. -Repeat colonoscopy in 5 years for screening purposes.  Colonoscopy 04/25/2013 by Dr. Maurene Capes: Incompetent rectal sphincter, evidence of prior rectal prolapse  Past Medical History:  Diagnosis Date  . Allergy    seasonal  . Anxiety   . Arthritis    knees,all over  . Basal cell carcinoma   . Benign breast cyst in female    PATIENT HAS HISTORY OF BREAST CYSTS  . Cataract    beginning bilateral  . Diabetes mellitus   . GERD (gastroesophageal reflux disease)   . Herpes progenitalis   . Hyperlipidemia   . Hypertension   . Kidney stone   . OSA on CPAP   . Sleep apnea    cpap  . Urinary, incontinence, stress female   . Vertigo     Current Medications, Allergies, Past Medical History, Past Surgical History, Family History and Social History were reviewed in [employer] record.   Physical Exam: BP (!) 150/80   Pulse 84   Temp (!) 97.3 F (36.3 C)   Ht 5\' 2"  (1.575 m)   Wt 133 lb (60.3 kg)   BMI 24.33 kg/m   General: Well developed 73 year old female in no acute  distress Head: Normocephalic and atraumatic Eyes:  sclerae anicteric, conjunctiva pink  Mouth: No ulcers or lesions Ears: Normal auditory acuity Lungs: Clear throughout to auscultation Heart: Regular rate and rhythm Abdomen: Soft, non tender and non distended. No masses, no hepatomegaly. Normal bowel sounds Rectal: Rectal prolapse assessed with a sentinel tag/erythematous lesion to the anterior anal area which appears to have recently bled.  No active bleeding at  this time.  No stool in the rectal vault.  Diminished anal sphincter tone. Musculoskeletal: Symmetrical with no gross deformities  Extremities: No edema  Neurological: Alert oriented x 4, grossly nonfocal Psychological:  Alert and cooperative. Normal mood and affect  Assessment and Recommendations: 59.  73 year old female with hematochezia, rectal prolapse with an associated anal sentinel tag/hypertrophied papilla/lesion -CBC, BMP -Refer to colorectal surgery for further evaluation and biopsy of the anal hypertrophied papilla/lesion -Advised the patient to use Vaseline to push the prolapsed mucosa back inside the rectum -Avoid straining, MiraLAX and Benefiber as needed  2.  GERD, stable  3.  IBS  4.  Colon cancer screening -Next colonoscopy due October 2023  5.   Obstructive sleep apnea on CPAP  6. AKI, kidney stones followed by urology -repeat BMP

## 2019-11-07 ENCOUNTER — Telehealth: Payer: Self-pay | Admitting: Neurology

## 2019-11-08 DIAGNOSIS — E876 Hypokalemia: Secondary | ICD-10-CM | POA: Diagnosis not present

## 2019-11-08 DIAGNOSIS — F419 Anxiety disorder, unspecified: Secondary | ICD-10-CM | POA: Diagnosis not present

## 2019-11-08 NOTE — Telephone Encounter (Signed)
-----   Message from Noralyn Pick, NP sent at 11/08/2019  8:58 AM EST ----- Regarding: Fax labs to pcp [facility] H[facility], can you please fax a copy of the patient's laboratory results to her PCP, I believe [facility] and I do not know if they are part of epic.  Call the patient this morning her potassium level is 3.2.  She needs to follow-up with her PCP regarding her low potassium level.  Thank you

## 2019-11-08 NOTE — Telephone Encounter (Signed)
Lab work has been faxed to the patient's PCP listed in [place]; fax confirmation received;

## 2019-11-08 NOTE — Telephone Encounter (Signed)
noted 

## 2019-11-12 ENCOUNTER — Other Ambulatory Visit: Payer: Self-pay

## 2019-11-13 ENCOUNTER — Encounter: Payer: Self-pay | Admitting: Women's Health

## 2019-11-13 ENCOUNTER — Ambulatory Visit (INDEPENDENT_AMBULATORY_CARE_PROVIDER_SITE_OTHER): Payer: Medicare Other | Admitting: Women's Health

## 2019-11-13 VITALS — BP 118/82

## 2019-11-13 DIAGNOSIS — N939 Abnormal uterine and vaginal bleeding, unspecified: Secondary | ICD-10-CM

## 2019-11-13 LAB — WET PREP FOR TRICH, YEAST, CLUE

## 2019-11-13 MED ORDER — METRONIDAZOLE 500 MG PO TABS: 500.0000 mg | ORAL_TABLET | Freq: Two times a day (BID) | ORAL | 0 refills | Status: DC

## 2019-11-13 NOTE — Progress Notes (Signed)
73 y.o. G3P2 MBF presents after ER visit for heavy,vaginal bleeding. Unsure if speculum exam done, but had a negative CT scan. Denies urinary symptoms, abdominal pain, and abnormal discharge. Medical history of hypertension, diabetes, hypercholesterolemia, and TVH. Saw gastroenterology 10/2019 for rectal fissure and has scheduled follow-up.   Exam: Appears well.  External genitalia normal, speculum exam no visible blood, lesions, erythema, white discharge without odor present. Wet prep positive for clue cells and TNTC bacteria.   Bacterial Vaginosis Unexplained vaginal bleeding/TVH  Plan: 500 mg Metronidazole BID for 7 days, alcohol precautions given. Instructed to call if problems continue. Reassurance given regarding normality, no blood noted. Keep scheduled follow-up for rectal bleeding/fissure.

## 2019-11-13 NOTE — Patient Instructions (Signed)

## 2019-11-21 DIAGNOSIS — E119 Type 2 diabetes mellitus without complications: Secondary | ICD-10-CM | POA: Diagnosis not present

## 2019-11-26 ENCOUNTER — Ambulatory Visit (INDEPENDENT_AMBULATORY_CARE_PROVIDER_SITE_OTHER): Payer: Medicare Other | Admitting: Neurology

## 2019-11-26 DIAGNOSIS — E876 Hypokalemia: Secondary | ICD-10-CM | POA: Diagnosis not present

## 2019-11-26 DIAGNOSIS — G4733 Obstructive sleep apnea (adult) (pediatric): Secondary | ICD-10-CM | POA: Diagnosis not present

## 2019-11-26 DIAGNOSIS — K921 Melena: Secondary | ICD-10-CM

## 2019-11-26 DIAGNOSIS — G4750 Parasomnia, unspecified: Secondary | ICD-10-CM

## 2019-11-26 DIAGNOSIS — K219 Gastro-esophageal reflux disease without esophagitis: Secondary | ICD-10-CM

## 2019-11-26 DIAGNOSIS — N179 Acute kidney failure, unspecified: Secondary | ICD-10-CM

## 2019-11-26 NOTE — Progress Notes (Signed)
Reviewed and agree with documentation and assessment and plan. K. Veena Christohper Dube , MD   

## 2019-12-04 ENCOUNTER — Telehealth: Payer: Self-pay | Admitting: Gastroenterology

## 2019-12-04 ENCOUNTER — Other Ambulatory Visit: Payer: Self-pay

## 2019-12-04 ENCOUNTER — Other Ambulatory Visit (INDEPENDENT_AMBULATORY_CARE_PROVIDER_SITE_OTHER): Payer: BC Managed Care – PPO

## 2019-12-04 DIAGNOSIS — K921 Melena: Secondary | ICD-10-CM | POA: Diagnosis not present

## 2019-12-04 LAB — HEMOGLOBIN AND HEMATOCRIT, BLOOD
HCT: 37.6 % (ref 36.0–46.0)
Hemoglobin: 12.4 g/dL (ref 12.0–15.0)

## 2019-12-04 MED ORDER — HYDROCORTISONE ACETATE 25 MG RE SUPP: 25.0000 mg | Freq: Every day | RECTAL | 0 refills | Status: AC

## 2019-12-04 NOTE — Telephone Encounter (Signed)
Tried x 2. Voicemail both times. Left message to call back.

## 2019-12-04 NOTE — Telephone Encounter (Signed)
Ok, please check H&H.  Send prescription for Anusol suppository at bedtime for 7 days, may be difficult for her to retain the suppository given rectal prolapse but can try.  Thank you

## 2019-12-04 NOTE — Telephone Encounter (Signed)
Spoke with the patient. She states the bleeding began when she was taking a shower. The bleeding is soaking her pad that she wears. She put a paper towel to catch the excess, but this has blood on it to. It is difficult for her to quantify the amount. She states "never been this much before." Abdomen is "a little sore." No nausea or vomiting. Last bowel movement was yesterday.  Asked if she could get someone to take her to the ED. She states her reluctance asking "what are they going to do? Nothing like last time, except wait for hours and hours?"  Please advise.

## 2019-12-04 NOTE — Telephone Encounter (Signed)
Spoke with the patient. She states the bleeding has stopped now. She has changed pads. She does not see anymore blood. Stomach feels "uneasy." She states she has an appointment with Dr Marcello Moores next week at [facility]. She will call us back if she has further issues or concerns before that time.

## 2019-12-04 NOTE — Telephone Encounter (Signed)
Spoke with the patient about this plan of care. She is in agreement with it. She will come to the lab today. Rx to her preferred pharmacy.

## 2019-12-04 NOTE — Telephone Encounter (Signed)
Pt reported that she is having heavy rectal bleeding.  Please advise.

## 2019-12-04 NOTE — Telephone Encounter (Signed)
She will need to come to ER given the degree of bleeding she is reporting to exclude diverticular hemorrhage

## 2019-12-10 NOTE — Procedures (Signed)
Patient Information     First Name: Kristen Last Name: Mahoney ID: [id number]  Birth Date: 1946-06-30 Age: 73 Gender: Female  Referring Provider: Marda Stalker, PA-C BMI: 24.3 (W=132 lb, H=5' 2'')  Neck Circ.:  14 '' Epworth:  5/24   Sleep Study Information    Study Date: Nov 26, 2019 S/H/A Version: 001.001.001.001 / 4.1.1528 / 60  History:    Kristen Mahoney has been a CPAP user since 2008 once she was originally diagnosed with obstructive sleep apnea at an AHI of 29.8/h. She achieved a lot of weight loss and was for this reason placed on an auto- titrating device. Last year the 95%t percentile pressure required to treat OSA was 12.5 cm water and this year it is 10.8 cm water. This correlates with further weight loss, by 23 pounds.  A residual AHI is 3.2/h but the majority is central in nature, she continues on her AutoSet with 1 cm EPR, she has used to machine 28 of 30 days but only 18 days for longer than 4 hours. Her sleep has been more fragmented since she is depressed, and she wakes up in the middle of the night having removed the mask. She has a dry mouth in AM.     Summary & Diagnosis:     The baseline AHI is still 21.4/h with obstructive sleep apnea as calculated by HST device. There is no clinically relevant hypoxemia or brady-tachycardic response. NREM sleep AHI was higher than REM AHI, which can indicate some central apneas to be present. There was a strong supine positional-dependence of apnea.   Recommendations:      CPAP is indicated at this moderate severe degree of apnea,but avoiding supine sleep will also help. The overall degree of baseline AHI is lower than in 2008. I would like to continue CPAP use, but the patient can also entertain a dental device or Inspire device.   Interpreting Physician: Larey Seat, MD             Sleep Summary    Oxygen Saturation Statistics     Start Study Time: End Study Time: Total Recording Time:          10:39:54 PM 8:43:55 AM 10  h, 4 min  Total Sleep Time % REM of Sleep Time:  7 h, 51 min  18.5    Mean: 95 Minimum: 92 Maximum: 99  Mean of Desaturations Nadirs (%):   93  Oxygen Desaturation. %: 4-9 10-20 >20 Total  Events Number Total  29 100.0  0 0.0  0 0.0  29 100.0  Oxygen Saturation: <90 <=88 <85 <80 <70  Duration (minutes): Sleep % 0.0 0.0 0.0 0.0 0.0 0.0 0.0 0.0 0.0 0.0     Respiratory Indices      Total Events REM NREM All Night  pRDI:  72  pAHI:  71 ODI:  29  pAHIc:  11  % CSR: 0.0 17.9 17.9 4.5 4.5 22.0 21.6 9.0 3.2 21.7 21.4 8.7 3.3       Pulse Rate Statistics during Sleep (BPM)      Mean:  85 Minimum: 69 Maximum: 105    Indices are calculated using technically valid sleep time of 6 h, 19 min. pRDI/pAHI are calculated using 02 desaturations ? 3%  Body Position Statistics  Position Supine Prone Right Left Non-Supine  Sleep (min) 170.0 0.0 301.5 0.0 301.5  Sleep % 36.1 0.0 63.9 0.0 63.9  pRDI 31.7 N/A 14.8 N/A 14.8  pAHI 31.7 N/A  14.3 N/A 14.3  ODI 18.4 N/A 2.0 N/A 2.0     Snoring Statistics Snoring Level (dB) >40 >50 >60 >70 >80 >Threshold (45)  Sleep (min) 174.6 20.0 3.2 0.2 0.0 35.0  Sleep % 37.0 4.2 0.7 0.0 0.0 7.4    Mean: 41 dB Sleep Stages Chart                                 pAHI=21.4

## 2019-12-10 NOTE — Progress Notes (Signed)
Summary & Diagnosis:   The baseline AHI is still 21.4/h with obstructive sleep apnea as  calculated by HST device. There is no clinically relevant  hypoxemia or brady-tachycardic response. NREM sleep AHI was  higher than REM AHI, which can indicate some central apneas to be  present. There was a strong supine positional-dependence of  apnea.   Recommendations:     CPAP is indicated at this moderate severe degree of apnea,but  avoiding supine sleep will also help. The overall degree of  baseline AHI is lower than in 2008. I would like to continue CPAP  use, but the patient can also entertain a dental device or  Inspire device.   Interpreting Physician: Larey Seat, MD

## 2019-12-10 NOTE — Addendum Note (Signed)
Addended by: Larey Seat on: 12/10/2019 12:28 PM   Modules accepted: Orders

## 2019-12-11 ENCOUNTER — Telehealth: Payer: Self-pay | Admitting: Neurology

## 2019-12-11 DIAGNOSIS — K623 Rectal prolapse: Secondary | ICD-10-CM | POA: Diagnosis not present

## 2019-12-11 NOTE — Telephone Encounter (Signed)
-----   Message from Larey Seat, MD sent at 12/10/2019 12:28 PM EST ----- Summary & Diagnosis:   The baseline AHI is still 21.4/h with obstructive sleep apnea as  calculated by HST device. There is no clinically relevant  hypoxemia or brady-tachycardic response. NREM sleep AHI was  higher than REM AHI, which can indicate some central apneas to be  present. There was a strong supine positional-dependence of  apnea.   Recommendations:     CPAP is indicated at this moderate severe degree of apnea,but  avoiding supine sleep will also help. The overall degree of  baseline AHI is lower than in 2008. I would like to continue CPAP  use, but the patient can also entertain a dental device or  Inspire device.   Interpreting Physician: Larey Seat, MD

## 2019-12-11 NOTE — Telephone Encounter (Signed)
I called pt. I advised pt that Dr. Brett Fairy reviewed their sleep study results and found that pt still has moderate degree of sleep apnea. Dr. Brett Fairy recommends that pt continue auto CPAP. I reviewed PAP compliance expectations with the pt. Pt is agreeable to starting a CPAP. I advised pt that an order will be sent to a DME, [facility], and [facility] will call the pt within about one week after they file with the pt's insurance. [employer] [facility] will show the pt how to use the machine, fit for masks, and troubleshoot the CPAP if needed. A follow up appt will need to be made for insurance purposes with Dr. Brett Fairy or the NP within 31-90 days after starting the machine. A letter with all of this information in it will be mailed to the pt as a reminder. I verified with the pt that the address we have on file is correct. Pt verbalized understanding of results. Pt had no questions at this time but was encouraged to call back if questions arise. I have sent the order to [facility] and have received confirmation that they have received the order.  Pt will call back to schedule the initial CPAP apt.

## 2019-12-23 DIAGNOSIS — E119 Type 2 diabetes mellitus without complications: Secondary | ICD-10-CM | POA: Diagnosis not present

## 2019-12-24 DIAGNOSIS — G4733 Obstructive sleep apnea (adult) (pediatric): Secondary | ICD-10-CM | POA: Diagnosis not present

## 2020-01-02 ENCOUNTER — Ambulatory Visit (INDEPENDENT_AMBULATORY_CARE_PROVIDER_SITE_OTHER): Payer: Medicare Other | Admitting: Gastroenterology

## 2020-01-02 ENCOUNTER — Encounter: Payer: Self-pay | Admitting: Gastroenterology

## 2020-01-02 VITALS — BP 134/70 | HR 68 | Temp 97.9°F | Ht 62.0 in | Wt 134.0 lb

## 2020-01-02 DIAGNOSIS — K623 Rectal prolapse: Secondary | ICD-10-CM

## 2020-01-02 DIAGNOSIS — R159 Full incontinence of feces: Secondary | ICD-10-CM | POA: Diagnosis not present

## 2020-01-02 DIAGNOSIS — K625 Hemorrhage of anus and rectum: Secondary | ICD-10-CM

## 2020-01-02 NOTE — Progress Notes (Signed)
Kristen Mahoney    [id number]    September 28, 1946  Primary Care Physician:Wharton, Myrtha Mantis  Referring Physician: Marda Stalker, [place],  [place] [address]   Chief complaint: BRBPR, rectal prolapse  HPI: 74 year old very pleasant female here for follow-up visit.  She is s/p hysterectomy complicated with vaginal fistula with repair in 2010, incompetent anal sphincter with partial rectal prolapse  She continues to have intermittent bright red blood per rectum  She saw Dr. Marcello Moores, underwent additional ano rectal band ligation to reduce the prolapsed rectum.  She did have some improvement but is continued to have bleeding and she does not have any sensation and cannot tell when she has completed a bowel movement. On occasion she has full incontinence of stool  Is current with colorectal cancer screening.  Denies any decreased appetite or weight loss recently.  GI Hx: Abdominal/pelvic CT 09/07/2019  Bilateral nephrolithiasis is noted. No hydronephrosis or renal obstruction is noted. Linear hyperdensity is noted posteriorly in the urinary bladder which most likely represents blood, but possible mass cannot be excluded. Cystoscopy is recommended for further evaluation. Aortic Atherosclerosis   Colonoscopy 10/12/2017 by Dr. Silverio Decamp: Decreased sphincter tone found on digital rectal exam. - Diverticulosis in the sigmoid colon and in the descending colon. - The examination was otherwise normal. - No specimens collected. -Repeat colonoscopy in 5 years for screening purposes.  Colonoscopy 04/25/2013 by Dr. Maurene Capes: Incompetent rectal sphincter, evidence of prior rectal prolapse  Outpatient Encounter Medications as of 01/02/2020  Medication Sig  . acetaminophen (TYLENOL) 500 MG tablet Take 1,000 mg by mouth every 6 (six) hours as needed (for pain.).  [name] Kitchen aspirin EC 81 MG tablet Take 81 mg by mouth every evening.   [name] Kitchen atorvastatin (LIPITOR) 20 MG  tablet Take 20 mg by mouth daily.   [name] Kitchen buPROPion (WELLBUTRIN XL) 150 MG 24 hr tablet Take 150 mg by mouth daily.  . fluticasone (FLONASE) 50 MCG/ACT nasal spray Place 1 spray into both nostrils daily as needed for allergies.   . hydrocortisone 1 % ointment Apply 1 application topically 3 (three) times daily. (Patient taking differently: Apply 1 application topically 3 (three) times daily as needed for itching. )  . hyoscyamine (LEVSIN SL) 0.125 MG SL tablet DISSOLVE 1 TABLET IN MOUTH EVERY 6 HOURS AS NEEDED FOR  CRAMPING (Patient taking differently: Take 0.125 mg by mouth every 6 (six) hours as needed for cramping. DISSOLVE 1 TABLET IN MOUTH EVERY 6 HOURS AS NEEDED FOR  CRAMPING)  . loratadine (CLARITIN) 10 MG tablet Take 10 mg by mouth daily as needed for allergies.  . metFORMIN (GLUCOPHAGE-XR) 500 MG 24 hr tablet Take 500 mg by mouth 2 (two) times daily.  . metoprolol succinate (TOPROL XL) 50 MG 24 hr tablet Take 1 tablet (50 mg total) by mouth daily. Take with or immediately following a meal.  . metroNIDAZOLE (FLAGYL) 500 MG tablet Take 1 tablet (500 mg total) by mouth 2 (two) times daily.  . Multiple Vitamins-Minerals (ICAPS) CAPS Take 1 capsule by mouth 2 (two) times daily.   [name] Kitchen omeprazole (PRILOSEC) 20 MG capsule Take 1 capsule (20 mg total) by mouth 2 (two) times daily before a meal.  . RESTASIS 0.05 % ophthalmic emulsion Place 1 drop into both eyes every 12 (twelve) hours.  . valACYclovir (VALTREX) 500 MG tablet Take one tablet twice daily for 3-5 days as needed with an outbreak, then daily as needed (Patient taking  differently: Take 500 mg by mouth 2 (two) times daily as needed (outbreak). )  . Vitamin D, Cholecalciferol, 400 units TABS Take 400 Units by mouth daily.   . vitamin E (VITAMIN E) 400 UNIT capsule Take 400 Units by mouth 2 (two) times daily.    Facility-Administered Encounter Medications as of 01/02/2020  Medication  . 0.9 %  sodium chloride infusion  . betamethasone  acetate-betamethasone sodium phosphate (CELESTONE) injection 3 mg    Allergies as of 01/02/2020 - Review Complete 11/06/2019  Allergen Reaction Noted  . Ciprofloxacin Rash 09/04/2009  . Morphine and related Itching 05/07/2011    Past Medical History:  Diagnosis Date  . Allergy    seasonal  . Anxiety   . Arthritis    knees,all over  . Basal cell carcinoma   . Benign breast cyst in female    PATIENT HAS HISTORY OF BREAST CYSTS  . Cataract    beginning bilateral  . Diabetes mellitus   . GERD (gastroesophageal reflux disease)   . Herpes progenitalis   . Hyperlipidemia   . Hypertension   . Kidney stone   . OSA on CPAP   . Sleep apnea    cpap  . Urinary, incontinence, stress female   . Vertigo     Past Surgical History:  Procedure Laterality Date  . ABDOMINAL HYSTERECTOMY  1986   leiomyomata  . Basal cell excised    . breast cystectomy     right  . CHOLECYSTECTOMY  2003  . COLONOSCOPY    . ear cystectomy    . ESOPHAGOGASTRODUODENOSCOPY    . KNEE ARTHROSCOPY     right  . LITHOTRIPSY    . RECTOVAGINAL FISTULA CLOSURE      Family History  Problem Relation Age of Onset  . Diabetes Mother   . Diabetes Father   . Colon cancer Father   . Heart disease Father   . Hypertension Father   . Diabetes Sister   . Hypertension Sister   . Diabetes Brother   . Colon polyps Brother   . Prostate cancer Brother   . Breast cancer Paternal Aunt        Age 24's  . Colon polyps Brother   . Heart disease Brother   . Stroke Brother   . Hyperlipidemia Brother   . Colon polyps Sister   . Diabetes Sister   . Hypertension Sister   . Heart disease Sister   . Esophageal cancer Neg Hx   . Rectal cancer Neg Hx   . Stomach cancer Neg Hx     Social History   Socioeconomic History  . Marital status: Married    Spouse name: Not on file  . Number of children: 2  . Years of education: Not on file  . Highest education level: Not on file  Occupational History  . Occupation:  Retired    Fish farm manager: RETIRED  Tobacco Use  . Smoking status: Former Research scientist (life sciences)  . Smokeless tobacco: Never Used  Substance and Sexual Activity  . Alcohol use: Yes    Comment: Rare  . Drug use: No  . Sexual activity: Yes    Birth control/protection: Surgical  Other Topics Concern  . Not on file  Social History Narrative   Denies caffeine use.   Social Determinants of Health   Financial Resource Strain:   . Difficulty of Paying Living Expenses: Not on file  Food Insecurity:   . Worried About Charity fundraiser in the Last Year: Not on  file  . [place] in the Last Year: Not on file  Transportation Needs:   . Lack of Transportation (Medical): Not on file  . Lack of Transportation (Non-Medical): Not on file  Physical Activity:   . Days of Exercise per Week: Not on file  . Minutes of Exercise per Session: Not on file  Stress:   . Feeling of Stress : Not on file  Social Connections:   . Frequency of Communication with Friends and Family: Not on file  . Frequency of Social Gatherings with Friends and Family: Not on file  . Attends Religious Services: Not on file  . Active Member of Clubs or Organizations: Not on file  . Attends Archivist Meetings: Not on file  . Marital Status: Not on file  Intimate Partner Violence:   . Fear of Current or Ex-Partner: Not on file  . Emotionally Abused: Not on file  . Physically Abused: Not on file  . Sexually Abused: Not on file      Review of systems: Review of Systems  Constitutional: Negative for fever and chills.  HENT: Negative.   Eyes: Negative for blurred vision.  Respiratory: Negative for cough, shortness of breath and wheezing.   Cardiovascular: Negative for chest pain and palpitations.  Gastrointestinal: as per HPI Genitourinary: Negative for dysuria, urgency, frequency and hematuria.  Musculoskeletal: Negative for myalgias, back pain and joint pain.  Skin: Negative for itching and rash.  Neurological:  Negative for dizziness, tremors, focal weakness, seizures and loss of consciousness.  Endo/Heme/Allergies: Positive for seasonal allergies.  Psychiatric/Behavioral: Negative for depression, suicidal ideas and hallucinations.  All other systems reviewed and are negative.   Physical Exam: Vitals:   01/02/20 1040  BP: 134/70  Pulse: 68  Temp: 97.9 F (36.6 C)   Body mass index is 24.51 kg/m. Gen:      No acute distress HEENT:  EOMI, sclera anicteric Neck:     No masses; no thyromegaly Lungs:    Clear to auscultation bilaterally; normal respiratory effort CV:         Regular rate and rhythm; no murmurs Abd:      + bowel sounds; soft, non-tender; no palpable masses, no distension Ext:    No edema; adequate peripheral perfusion Skin:      Warm and dry; no rash Neuro: alert and oriented x 3 Psych: normal mood and affect Rectal exam: Prolapsed rectal mucosa anteriorly, decreased anal sphincter tone, no anal fissure or external hemorrhoids Anoscopy: Visualized anterior banding site, mild erythema and heme on distal rectal mucosa likely secondary to friction from prolapse in and out  Data Reviewed:  Reviewed labs, radiology imaging, old records and pertinent past GI work up   Assessment and Plan/Recommendations:  74 year old female with incompetent anal sphincter, partial rectal prolapse and intermittent bright red blood per rectum  BRBPR: Due to irritation of prolapsed rectal mucosa, advised patient to wipe excessively after bowel movement Start using bidet if able to to help clean up after bowel movement  We will have patient follow-up with Dr. Marcello Moores for possible additional banding to improve the residual rectal prolapse  Avoid excessive straining  Fecal incontinence secondary to incompetent anal sphincter and rectal prolapse Keagle exercises to improve anal sphincter tone and will also refer to pelvic floor PT Benefiber 1 tablespoon 3 times daily  Colorectal cancer  screening.  Due for recall October 2023  25 minutes was spent face-to-face with the patient. Greater than 50% of the time used  for counseling as well as treatment plan and follow-up. She had multiple questions which were answered to her satisfaction  K. Denzil Magnuson , MD    CC: Marda Stalker, PA-C

## 2020-01-02 NOTE — Patient Instructions (Addendum)
We will refer you to Dr Marcello Moores for rectal prolapse for additional anorectal bandings ( You already have an appointment scheduled at [facility] on 01/08/2020 at 10:20am)  Use Bidet to clear up after bowel movements and AVOID excessive wiping   Take Benefiber 1 tablespoon three times a day with meals   Kegel Exercises  Kegel exercises can help strengthen your pelvic floor muscles. The pelvic floor is a group of muscles that support your rectum, small intestine, and bladder. In females, pelvic floor muscles also help support the womb (uterus). These muscles help you control the flow of urine and stool. Kegel exercises are painless and simple, and they do not require any equipment. Your provider may suggest Kegel exercises to:  Improve bladder and bowel control.  Improve sexual response.  Improve weak pelvic floor muscles after surgery to remove the uterus (hysterectomy) or pregnancy (females).  Improve weak pelvic floor muscles after prostate gland removal or surgery (males). Kegel exercises involve squeezing your pelvic floor muscles, which are the same muscles you squeeze when you try to stop the flow of urine or keep from passing gas. The exercises can be done while sitting, standing, or lying down, but it is best to vary your position. Exercises How to do Kegel exercises: 1. Squeeze your pelvic floor muscles tight. You should feel a tight lift in your rectal area. If you are a female, you should also feel a tightness in your vaginal area. Keep your stomach, buttocks, and legs relaxed. 2. Hold the muscles tight for up to 10 seconds. 3. Breathe normally. 4. Relax your muscles. 5. Repeat as told by your health care provider. Repeat this exercise daily as told by your health care provider. Continue to do this exercise for at least 4-6 weeks, or for as long as told by your health care provider. You may be referred to a physical therapist who can help you learn more about how to do Kegel  exercises. Depending on your condition, your health care provider may recommend:  Varying how long you squeeze your muscles.  Doing several sets of exercises every day.  Doing exercises for several weeks.  Making Kegel exercises a part of your regular exercise routine. This information is not intended to replace advice given to you by your health care provider. Make sure you discuss any questions you have with your health care provider. Document Revised: 08/02/2018 Document Reviewed: 08/02/2018 Elsevier Patient Education  [place].   I appreciate the  opportunity to care for you  Thank You   Harl Bowie , MD

## 2020-01-04 DIAGNOSIS — N3021 Other chronic cystitis with hematuria: Secondary | ICD-10-CM | POA: Diagnosis not present

## 2020-01-07 ENCOUNTER — Telehealth: Payer: Self-pay | Admitting: Gastroenterology

## 2020-01-07 NOTE — Telephone Encounter (Signed)
Patient calling states that Dr. Silverio Decamp had told her to go to Dr. Marcello Moores at [facility]- they called her this morning to set up a virtual appointment- but she said that our office was supposed to reach out to them to let them know what Dr. Silverio Decamp was recommending- she was wondering if we had reached out to them yet.

## 2020-01-07 NOTE — Telephone Encounter (Signed)
Called the patient. No answer. Go the voicemail. Left her a message. I have called [facility] and spoken with a scheduler. She cannot tell that the appointment is virtual. She will look for the office note which I have faxed over. Encouraged patient to confirm the appointment is in fact virtual.

## 2020-01-08 DIAGNOSIS — K623 Rectal prolapse: Secondary | ICD-10-CM | POA: Diagnosis not present

## 2020-01-11 DIAGNOSIS — E119 Type 2 diabetes mellitus without complications: Secondary | ICD-10-CM | POA: Diagnosis not present

## 2020-01-15 DIAGNOSIS — E785 Hyperlipidemia, unspecified: Secondary | ICD-10-CM | POA: Diagnosis not present

## 2020-01-15 DIAGNOSIS — I10 Essential (primary) hypertension: Secondary | ICD-10-CM | POA: Diagnosis not present

## 2020-01-15 DIAGNOSIS — R0609 Other forms of dyspnea: Secondary | ICD-10-CM | POA: Diagnosis not present

## 2020-01-15 DIAGNOSIS — J309 Allergic rhinitis, unspecified: Secondary | ICD-10-CM | POA: Diagnosis not present

## 2020-01-16 ENCOUNTER — Ambulatory Visit: Payer: Medicare Other | Attending: Internal Medicine

## 2020-01-16 DIAGNOSIS — Z23 Encounter for immunization: Secondary | ICD-10-CM | POA: Insufficient documentation

## 2020-01-18 DIAGNOSIS — E119 Type 2 diabetes mellitus without complications: Secondary | ICD-10-CM | POA: Diagnosis not present

## 2020-01-18 DIAGNOSIS — I10 Essential (primary) hypertension: Secondary | ICD-10-CM | POA: Diagnosis not present

## 2020-01-18 DIAGNOSIS — E785 Hyperlipidemia, unspecified: Secondary | ICD-10-CM | POA: Diagnosis not present

## 2020-01-24 DIAGNOSIS — G4733 Obstructive sleep apnea (adult) (pediatric): Secondary | ICD-10-CM | POA: Diagnosis not present

## 2020-01-30 DIAGNOSIS — N3021 Other chronic cystitis with hematuria: Secondary | ICD-10-CM | POA: Diagnosis not present

## 2020-01-30 DIAGNOSIS — N200 Calculus of kidney: Secondary | ICD-10-CM | POA: Diagnosis not present

## 2020-01-30 DIAGNOSIS — R1084 Generalized abdominal pain: Secondary | ICD-10-CM | POA: Diagnosis not present

## 2020-02-04 ENCOUNTER — Ambulatory Visit: Payer: Medicare Other | Attending: Internal Medicine

## 2020-02-04 DIAGNOSIS — Z23 Encounter for immunization: Secondary | ICD-10-CM | POA: Insufficient documentation

## 2020-02-04 NOTE — Progress Notes (Signed)
   Covid-19 Vaccination Clinic  Name:  Kristen Mahoney    MRN: [id number] DOB: 1946-03-21  02/04/2020  [name] was observed post Covid-19 immunization for 30 minutes based on pre-vaccination screening without incidence. She was provided with Vaccine Information Sheet and instruction to access the V-Safe system.   [name] was instructed to call 911 with any severe reactions post vaccine: [name] Kitchen Difficulty breathing  . Swelling of your face and throat  . A fast heartbeat  . A bad rash all over your body  . Dizziness and weakness    Immunizations Administered    Name Date Dose VIS Date Route   Pfizer COVID-19 Vaccine 02/04/2020  4:05 PM 0.3 mL 12/07/2019 Intramuscular   Manufacturer: [place]   Lot: [id number]   [place]: [id number]

## 2020-02-05 DIAGNOSIS — M6289 Other specified disorders of muscle: Secondary | ICD-10-CM | POA: Diagnosis not present

## 2020-02-05 DIAGNOSIS — M62838 Other muscle spasm: Secondary | ICD-10-CM | POA: Diagnosis not present

## 2020-02-05 DIAGNOSIS — M6281 Muscle weakness (generalized): Secondary | ICD-10-CM | POA: Diagnosis not present

## 2020-02-05 DIAGNOSIS — N3021 Other chronic cystitis with hematuria: Secondary | ICD-10-CM | POA: Diagnosis not present

## 2020-02-08 DIAGNOSIS — N3021 Other chronic cystitis with hematuria: Secondary | ICD-10-CM | POA: Diagnosis not present

## 2020-02-08 DIAGNOSIS — N200 Calculus of kidney: Secondary | ICD-10-CM | POA: Diagnosis not present

## 2020-02-08 DIAGNOSIS — R1084 Generalized abdominal pain: Secondary | ICD-10-CM | POA: Diagnosis not present

## 2020-02-13 ENCOUNTER — Other Ambulatory Visit: Payer: Self-pay

## 2020-02-13 ENCOUNTER — Encounter: Payer: Self-pay | Admitting: Neurology

## 2020-02-13 ENCOUNTER — Ambulatory Visit (INDEPENDENT_AMBULATORY_CARE_PROVIDER_SITE_OTHER): Payer: Medicare Other | Admitting: Neurology

## 2020-02-13 VITALS — BP 138/84 | HR 84 | Temp 97.6°F | Ht 62.0 in | Wt 130.0 lb

## 2020-02-13 DIAGNOSIS — R634 Abnormal weight loss: Secondary | ICD-10-CM | POA: Diagnosis not present

## 2020-02-13 DIAGNOSIS — G4733 Obstructive sleep apnea (adult) (pediatric): Secondary | ICD-10-CM | POA: Diagnosis not present

## 2020-02-13 DIAGNOSIS — N824 Other female intestinal-genital tract fistulae: Secondary | ICD-10-CM

## 2020-02-13 DIAGNOSIS — I10 Essential (primary) hypertension: Secondary | ICD-10-CM

## 2020-02-13 DIAGNOSIS — Z9989 Dependence on other enabling machines and devices: Secondary | ICD-10-CM

## 2020-02-13 NOTE — Patient Instructions (Signed)

## 2020-02-13 NOTE — Progress Notes (Signed)
SLEEP MED[facility]INE CLINIC   Provider:  Larey Seat, M D  Referring Provider: Marda Stalker, PA-C Primary Care Physician:  Marda Stalker, PA-C  Chief Complaint  Patient presents with  . Follow-up    pt alone, rm 11. pt states things are well. DME [employer] [facility].    HPI: I have pleasure of seeing Kristen Mahoney today who has been a CPAP user since 2008 when she was originally diagnosed with obstructive sleep apnea at an AHI of 29.8. She achieved a lot of weight loss and was for this reason placed on an auto titrating. Last year the 95%t percentile pressure was 12.5 cm water and this year it is 10.8 cm water. This correlates with further weight loss, 23 pounds.  A residual AHI is 3.2 but the majority is central in nature, she continues on her AutoSet was 1 cm EPR she has used to machine 28 out of 30 days but only 18 days for longer than 4 hours. Her sleep has been more fragmented since she is depressed and she wakes up in the middle of the night having removed the mask. She has a dry mouth . This was not all intentional weight loss, some of this may be attributed to depression. She felt severely depressed she had finally weaned off alprazolam, and she was started on an SSRI, namely Zoloft. She still has a feeling of in a unrest and jitteriness, anxiety and she is excessively daytime sleepy. All this fits depression. Her symptoms have gotten better since she started Zoloft but they have not been yet controlled. I think it may help if she also exercises daily. She has rectal-stool incontinence which makes it hard for her to swim. Epworth 6 points, FSS 20 points.   I have not encountered Kristen Mahoney and a little over 3 years. The patient is here as a new patient as-established patient revisit. She has seen me in the past for her sleep disorder which is obstructive sleep apnea. Her primary care physician is now [place]. She needs new CPAP supplies and ongoing follow-up and for this reason  brought his CPAP machine here today. As I have seen Kristen Mahoney last she has become a grandmother her grandson just celebrated his second birthday. She is a mother of 2 adult children. She is retired. He is also followed by orthopedist Dr. Elesa Massed and by cardiologist Dr. Terrence Dupont . She has diabetes which has been well controlled as long as she was able to exercise and walk. Due to knee pain she has been less able to exercise. She has no medication side effects that she reports at this time she feels extraordinarily well but she needs new supplies.  Kristen Mahoney's sleep study from 09-05-2007 revealed an AHI of 29.8, supine AHI of 47.4 and nonsupine of 11.1 REM AHI of 32. Periodic leg movements for a few was an arousal index of 3.6 she was snoring. She was titrated to CPAP but then placed on an auto titrator as she achieved weight loss.  I was able to review the patient's last laboratory results:  her hemoglobin A1c was elevated after receiving cortisone shots into the knee at 7.7. This has been higher than usual for her. No cause was 160s 3. Cholesterol is controlled. Also she has a discrepancy between LDL and HDL. [place] once to make sure that the patient is followed for her CPAP and compliance.  Her sleep habits are described as follows, she usually goes to bed late and watches TV usually  a movie at about 2 AM before finally going to sleep. Fall asleep in front of the TV. The TV in the bedroom. She stated that her husband rarely disturbs her sleep and he may occasionally snore. But she is moving restlessly. She may have one bathroom break at night but usually nocturia does not fragment his sleep. And she rises in the morning at 10 AM. Today's an unusual early day for her now in retirement. She may drink some caffeine in the morning. She has breakfast at home. She is trying to exercise by walking. She enjoyed her journey to [place] 2019 .    She had a CPAP first in 2014. Interval history from 07/13/2016. Mrs.  Mahoney's apnea has now been well controlled by using an AutoSet between 5 and 15 cm water was 1 cm EPR, the 95th percentile pressure is 12.5 cm her apnea index is 5.0 there still 3.5 obstructive apneas per hour but these are negligible. She is using her machine 7 hours 1 minute each day on average and has a 97% compliance for over 4 hours of use. Based on these data I will see the patient again in one year. Her Epworth sleepiness score today was endorsed at 7 and her fatigue severity at 15 points. There is no evidence of clinical depression.  07-17-2018 , Kristen Mahoney is a 74 y.o. female  is seen here as a  revisit  from Dr. Radene Ou for sleep medicine evaluation, CPAP compliance. I have followed and Kristen Mahoney since 2008 when I first diagnosed her with sleep apnea.  She has become 1 of my most compliant long-term CPAP users, continuing to use her machine at 97% compliance 29 out of 30 days with an average use at time of 5 hours 48 minutes.  The machine is an AutoSet relatively new with a pressure window between 5 and 15 cmH2O 1 cm EPR and a residual AHI of only 2.7.  She does have some central apneas emerging and for this reason we have not raise the pressure any further, the 95th percentile pressure is 10.8 cmH2O.  I see no reason to adjust her pressures she is doing well with the current settings and I will encourage her to use her CPAP as much as she possibly can especially when she travels and also when she takes naps.  She has benefited from CPAP therapy her Epworth sleepiness score has been continuously under 10 points and is today again endorsed at 7 points, fatigue severity was 15 points.  There is also no evidence of clinical depression also she conveyed that she was a little overwhelmed and emotional as she had to arrange the memorial services for 2 sorority sisters without family of their own. She is awaiting cataract surgery.    RCV on 11-06-2019:   " I am still not sleeping- waking too early,  independent of CPAP use.  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 she still has episodes of dizziness and complains this is mostly on the left side of head. set up date for machine was 2014 pt is eligible for a new machine. Download was not possible -   PS : she feels better since watching her hydration. Addendum: 11-07-2019, I am dictating the CPAP auto titrator compliance report for Kristen Mahoney, encompassing the dates from 12 October through 06 November 2019.  The patient has used her CPAP on 27 out of 30 days, but on 9 days was not able to fulfill the 4-hour minimum requirement.   Average user time is just 4-1/2 hours.   She still using an S9 AutoSet which will be replaced soon currently with a minimum pressure of 5 and a maximum pressure of 15 and an EPR level of 1 AHI is 3.8 this to be related to central apneas there is not much air leak actually noted this is a moderate degree of air leak with a 95th percentile of 21.2 L/min.  The 95th percentile pressure is 11.4 cmH2O and is well covered with in the current pressure window.  Her compliance is only 60% and has to improve.    I know that her machine is due for replacement but it seems at this time still to work well and she could keep it as a second machine when she travels for example.    In addition I think [facility] for forwarding her recent lab reports to me the patient had been concerned about a acute renal failure earlier this year she had repeat blood tests drawn on 5 October and they show her glomerular filtration filtration rate has recovered to 60 her sodium level was high at 149 and has been in past tests as well she had hypokalemia with 3.2 L millimole her creatinine was 4.91 which for a lady of biracial descent is normal.  Calcium was 8.1 slightly lower CO2 had risen to 31  mmol/L but is still within normal range.  At this time it is advisable for her to keep up the high fluid intake the best to drink water.  HbA1c was last tested in August and was 6.5 clearly diabetic level,Kamrie Fanton, MD        RV 02-13-2020; I have the pleasure of seeing and see [place] today a minute but 74 year old long established CPAP patient of mine who also has a history of hypertension, intentional weight loss her current BMI is 23, osteoarthritis. Bowel incontinence. Fistula.  We repeated a sleep study by home sleep test on November 26, 2019 since she has lost so much weight in part because of her abdominal fistula she has trouble to regain weight 2.  Her baseline AHI was 221.4 no hypoxemia or bradycardia tachycardia was not noted in association.  There was a strong supine positional component.  I told the patient that she could continue CPAP use but she could also at this stage entertain dental device therapy she does not necessarily need to use CPAP for such rather mild apnea as long as she can sleep off her back.  Her compliance report shows 100% compliance for her at times. Mrs. Wallace compliance is 100% by days 97% by hours with an average usage time of 6 hours and 58 minutes nightly.  She is using an air sense 10 AutoSet with a serial #[id number], set with a pressure window between 6 and 15 cm water pressure and 3 cm expiratory pressure relief.   Her residual AHI is 3.4/h which is a very good control- she has a slightly higher number of central apneas now than she used to have.  Her 95th percentile pressure is 9.5 cmH2O, so well within her current pressure .  I would not need to change the settings at this time.   Review of Systems: Out of a complete 14 system review, the patient complains of only the following symptoms, and all  other reviewed systems are negative. Big toe pain,   The patient has transiently suffered from higher blood pressures on higher blood glucose levels due  to the steroid treatment of her knee pain. She further has reported snoring at times. Vision joint pain aching muscles and diarrhea and dizziness. She reports rectal bleeding.  Was diagnosed as fistula.  Urinary bleeding- diagnosed with dehydration , urine is cloudy-  On ATB a night - nitrofurantoin.   The patient still socially drinks some alcohol she drinks her coffee or caffeinated sodas occasionally but certainly not daily she is a retired Sales executive. She lives with her husband. Nausea, jitteriness, bad taste in the mouth, drop in CPAP -. intentional weight loss. Thyroid normal. grief reaction.    How likely are you to doze in the following situations: 0 = not likely, 1 = slight chance, 2 = moderate chance, 3 = high chance  Sitting and Reading? Watching Television? Sitting inactive in a public place (theater or meeting)? Lying down in the afternoon when circumstances permit? Sitting and talking to someone? Sitting quietly after lunch without alcohol? In a car, while stopped for a few minutes in traffic? As a passenger in a car for an hour without a break? / 24  Total = 6  [employer] [facility] is her DME .     Ref Range & Units 48mo ago (09/20/19) 31mo ago (09/07/19) 51yr ago (10/18/18) 33yr ago (06/19/18)  Color, Urine YELLOW YELLOW  REDAbnormal  CM  DARK YELLOW  YELLOW   APPearance CLEAR CLEAR  TURBIDAbnormal   CLOUDYAbnormal   CLOUDYAbnormal    Specific Gravity, Urine 1.005 - 1.030 1.012  TEST NOT REPORTED DUE TO COLOR INTERFERENCE OF URINE PIGMENT  1.025 R, CM  1.020 R   pH 5.0 - 8.0 5.0  TEST NOT REPORTED DUE TO COLOR INTERFERENCE OF URINE PIGMENT      Leukocytes,Ua NEGATIVE LARGEAbnormal   LARGEAbnormal   TEST NOT REPORTED DUE TO COLOR INTERFERENCE OF URINE PIGMENTAbnormal     RBC / HPF 0 - 5 RBC/hpf 11-20  21-50  >50High   10-20Abnormal  R   WBC, UA 0 - 5 WBC/hpf >50High   21-50   > OR = 60Abnormal  R   Bacteria, UA NONE SEEN RAREAbnormal   RAREAbnormal   NONE SEEN CM  MANYAbnormal  R    Squamous Epithelial / LPF 0 - 5 6-10  0-5   10-20Abnormal  R   Comment: Performed at [facility], [place] [address], [place], [place] [address]  Mucus   PRESENT CM     Nitrites, Initial     NEGATIVE R   Leukocyte Esterase     3+Abnormal  R   Hyaline Cast     NONE SEEN R   Urine-Other      CM   Note      CM   Resulting Agency  [facility]      Specimen Collected: 09/21/19 20:17 Last Resulted: 09/21/19 21:22       Social History   Socioeconomic History  . Marital status: Married    Spouse name: Not on file  . Number of children: 2  . Years of education: Not on file  . Highest education level: Not on file  Occupational History  . Occupation: Retired    Fish farm manager: RETIRED  Tobacco Use  . Smoking status: Former Research scientist (life sciences)  . Smokeless tobacco: Never Used  Substance and Sexual Activity  .  Alcohol use: Yes    Comment: Rare  . Drug use: No  . Sexual activity: Yes    Birth control/protection: Surgical  Other Topics Concern  . Not on file  Social History Narrative   Denies caffeine use.   Social Determinants of Health   Financial Resource Strain:   . Difficulty of Paying Living Expenses: Not on file  Food Insecurity:   . Worried About Charity fundraiser in the Last Year: Not on file  . Ran Out of Food in the Last Year: Not on file  Transportation Needs:   . Lack of Transportation (Medical): Not on file  . Lack of Transportation (Non-Medical): Not on file  Physical Activity:   . Days of Exercise per Week: Not on file  . Minutes of Exercise per Session: Not on file  Stress:   . Feeling of Stress : Not on file  Social Connections:   . Frequency of Communication with Friends and Family: Not on file  . Frequency of Social Gatherings with Friends and Family: Not on file  . Attends Religious Services: Not on file  . Active Member of Clubs or Organizations: Not on file  . Attends Archivist Meetings: Not on file  .  Marital Status: Not on file  Intimate Partner Violence:   . Fear of Current or Ex-Partner: Not on file  . Emotionally Abused: Not on file  . Physically Abused: Not on file  . Sexually Abused: Not on file    Family History  Problem Relation Age of Onset  . Diabetes Mother   . Diabetes Father   . Colon cancer Father   . Heart disease Father   . Hypertension Father   . Diabetes Sister   . Hypertension Sister   . Diabetes Brother   . Colon polyps Brother   . Prostate cancer Brother   . Breast cancer Paternal Aunt        Age 44's  . Colon polyps Brother   . Heart disease Brother   . Stroke Brother   . Hyperlipidemia Brother   . Colon polyps Sister   . Diabetes Sister   . Hypertension Sister   . Heart disease Sister   . Esophageal cancer Neg Hx   . Rectal cancer Neg Hx   . Stomach cancer Neg Hx     Past Medical History:  Diagnosis Date  . Allergy    seasonal  . Anxiety   . Arthritis    knees,all over  . Basal cell carcinoma   . Benign breast cyst in female    PATIENT HAS HISTORY OF BREAST CYSTS  . Cataract    beginning bilateral  . Diabetes mellitus   . GERD (gastroesophageal reflux disease)   . Herpes progenitalis   . Hyperlipidemia   . Hypertension   . Kidney stone   . OSA on CPAP   . Sleep apnea    cpap  . Urinary, incontinence, stress female   . Vertigo     Past Surgical History:  Procedure Laterality Date  . ABDOMINAL HYSTERECTOMY  1986   leiomyomata  . Basal cell excised    . breast cystectomy     right  . CHOLECYSTECTOMY  2003  . COLONOSCOPY    . ear cystectomy    . ESOPHAGOGASTRODUODENOSCOPY    . KNEE ARTHROSCOPY     right  . LITHOTRIPSY    . RECTOVAGINAL FISTULA CLOSURE      Current Outpatient Medications  Medication Sig  Dispense Refill  . acetaminophen (TYLENOL) 500 MG tablet Take 1,000 mg by mouth every 6 (six) hours as needed (for pain.).    [name] Kitchen aspirin EC 81 MG tablet Take 81 mg by mouth every evening.     [name] Kitchen atorvastatin (LIPITOR)  20 MG tablet Take 20 mg by mouth daily.     [name] Kitchen buPROPion (WELLBUTRIN XL) 150 MG 24 hr tablet Take 150 mg by mouth daily.    . fluticasone (FLONASE) 50 MCG/ACT nasal spray Place 1 spray into both nostrils daily as needed for allergies.     . hydrocortisone 1 % ointment Apply 1 application topically 3 (three) times daily. (Patient taking differently: Apply 1 application topically 3 (three) times daily as needed for itching. ) 30 g 0  . hyoscyamine (LEVSIN SL) 0.125 MG SL tablet DISSOLVE 1 TABLET IN MOUTH EVERY 6 HOURS AS NEEDED FOR  CRAMPING (Patient taking differently: Take 0.125 mg by mouth every 6 (six) hours as needed for cramping. DISSOLVE 1 TABLET IN MOUTH EVERY 6 HOURS AS NEEDED FOR  CRAMPING) 270 tablet 1  . loratadine (CLARITIN) 10 MG tablet Take 10 mg by mouth daily as needed for allergies.    . metFORMIN (GLUCOPHAGE-XR) 500 MG 24 hr tablet Take 500 mg by mouth 2 (two) times daily.    . metoprolol succinate (TOPROL XL) 50 MG 24 hr tablet Take 1 tablet (50 mg total) by mouth daily. Take with or immediately following a meal. 30 tablet 0  . metroNIDAZOLE (FLAGYL) 500 MG tablet Take 1 tablet (500 mg total) by mouth 2 (two) times daily. 14 tablet 0  . Multiple Vitamins-Minerals (ICAPS) CAPS Take 1 capsule by mouth 2 (two) times daily.     . nitrofurantoin (MACRODANTIN) 25 MG capsule Take 25 mg by mouth at bedtime.    [name] Kitchen omeprazole (PRILOSEC) 20 MG capsule Take 1 capsule (20 mg total) by mouth 2 (two) times daily before a meal. 180 capsule 3  . RESTASIS 0.05 % ophthalmic emulsion Place 1 drop into both eyes every 12 (twelve) hours.    . valACYclovir (VALTREX) 500 MG tablet Take one tablet twice daily for 3-5 days as needed with an outbreak, then daily as needed (Patient taking differently: Take 500 mg by mouth 2 (two) times daily as needed (outbreak). ) 30 tablet 12  . Vitamin D, Cholecalciferol, 400 units TABS Take 400 Units by mouth daily.     . vitamin E (VITAMIN E) 400 UNIT capsule Take 400 Units  by mouth 2 (two) times daily.      Current Facility-Administered Medications  Medication Dose Route Frequency Provider Last Rate Last Admin  . 0.9 %  sodium chloride infusion  500 mL Intravenous Continuous Nandigam, Kavitha V, MD      . betamethasone acetate-betamethasone sodium phosphate (CELESTONE) injection 3 mg  3 mg Intramuscular Once Edrick Kins, DPM        Allergies as of 02/13/2020 - Review Complete 02/13/2020  Allergen Reaction Noted  . Ciprofloxacin Rash 09/04/2009  . Morphine and related Itching 05/07/2011    Vitals: BP 138/84   Pulse 84   Temp 97.6 F (36.4 C)   Ht 5\' 2"  (1.575 m)   Wt 130 lb (59 kg)   BMI 23.78 kg/m  Last Weight:  Wt Readings from Last 1 Encounters:  02/13/20 130 lb (59 kg)       Last Height:   Ht Readings from Last 1 Encounters:  02/13/20 5\' 2"  (1.575 m)    Physical  exam:  General: The patient is awake, alert , well groomed. Head: Normocephalic, atraumatic. Neck is supple. Mallampati 3   neck circumference: 14 inches. Nasal airflow ; allergies affected. TMJ is not evident . Retrognathia is seen.  Cardiovascular:  Regular rate  Respiratory: Skin:  Without evidence of edema, or rash.  Trunk: BMI is elevated and patient  has normal posture. Speech is fluent without dysarthria, dysphonia or aphasia. Mood and affect are a little depressed.  Cranial nerves:   Pupils are equal in size. Funduscopic exam without evidence of pallor or edema. Extraocular movements  in vertical and horizontal planes intact and without nystagmus. Visual fields  intact.Hearing to finger rub intact.   Facial sensation intact to fine touch. Facial motor strength is symmetric and tongue and uvula move midline. Motor exam:  Normal tone ,muscle bulk and symmetric strength in all extremities.Possible spinal stenosis , hyperreflexia in both knees , in spite of diabetes. Weakness when rising , needs to brace herself.   The patient was advised of the nature of the diagnosed  sleep disorder , the treatment options and risks for general a health and wellness arising from not treating the condition. Visit duration was 30 minutes.    Assessment:  After physical and neurologic examination, review of laboratory studies, imaging, neurophysiology testing and pre-existing records, assessment is :   Mrs. Calton has remained a highly compliant CPAP user and her new auto titration is working well for her.  I even feel that they could reduce her maximum pressure given that she has continued to lose some weight.  However that would be a reduction from currently 15 cm to 13 in stool still would cover all her pressure needs.  I congratulated her for her compliance and she has really not become more sleepy she she is not excessively fatigued and she did not endorse depression which is rare in this pandemic time.   Plan:  Treatment plan and additional workup :  Rv in 12 month with new CPAP/ with NP, we will alternate visits yearly.      Asencion Partridge Makinzy Cleere MD  02/13/2020

## 2020-02-20 DIAGNOSIS — R1084 Generalized abdominal pain: Secondary | ICD-10-CM | POA: Diagnosis not present

## 2020-02-20 DIAGNOSIS — R152 Fecal urgency: Secondary | ICD-10-CM | POA: Diagnosis not present

## 2020-02-20 DIAGNOSIS — M6281 Muscle weakness (generalized): Secondary | ICD-10-CM | POA: Diagnosis not present

## 2020-02-20 DIAGNOSIS — R151 Fecal smearing: Secondary | ICD-10-CM | POA: Diagnosis not present

## 2020-02-22 DIAGNOSIS — R0609 Other forms of dyspnea: Secondary | ICD-10-CM | POA: Diagnosis not present

## 2020-03-04 DIAGNOSIS — E119 Type 2 diabetes mellitus without complications: Secondary | ICD-10-CM | POA: Diagnosis not present

## 2020-03-05 DIAGNOSIS — R151 Fecal smearing: Secondary | ICD-10-CM | POA: Diagnosis not present

## 2020-03-05 DIAGNOSIS — R152 Fecal urgency: Secondary | ICD-10-CM | POA: Diagnosis not present

## 2020-03-05 DIAGNOSIS — R1084 Generalized abdominal pain: Secondary | ICD-10-CM | POA: Diagnosis not present

## 2020-03-05 DIAGNOSIS — M62838 Other muscle spasm: Secondary | ICD-10-CM | POA: Diagnosis not present

## 2020-03-07 ENCOUNTER — Other Ambulatory Visit: Payer: Self-pay

## 2020-03-10 ENCOUNTER — Other Ambulatory Visit: Payer: Self-pay

## 2020-03-10 ENCOUNTER — Encounter: Payer: Self-pay | Admitting: Women's Health

## 2020-03-10 ENCOUNTER — Ambulatory Visit (INDEPENDENT_AMBULATORY_CARE_PROVIDER_SITE_OTHER): Payer: Medicare Other | Admitting: Women's Health

## 2020-03-10 VITALS — BP 126/80 | Ht 62.0 in | Wt 131.0 lb

## 2020-03-10 DIAGNOSIS — Z9289 Personal history of other medical treatment: Secondary | ICD-10-CM | POA: Diagnosis not present

## 2020-03-10 DIAGNOSIS — Z01419 Encounter for gynecological examination (general) (routine) without abnormal findings: Secondary | ICD-10-CM | POA: Diagnosis not present

## 2020-03-10 NOTE — Progress Notes (Signed)
Kristen Mahoney Jun 02, 1946 [id number]    History:    Presents for breast and pelvic exam.  TVH for fibroids on no HRT.  Primary care manages hypertension, hypercholesteremia, diabetes, anxiety/depression.  Having some stool incontinence working with a physical therapist through GI office, history IBS, GERD.  09/2017 numerous negative/benign colon polyps 5-year follow-up.  2016 DEXA +3 at the spine, +1 at hip.  Had Covid vaccine in February.  Current on vaccines.  Past medical history, past surgical history, family history and social history were all reviewed and documented in the EPIC chart.  2 children both doing well grandson 80 years old lives in [place].  2003 cholecystectomy.  2001 rectovaginal fistula.  ROS:  A ROS was performed and pertinent positives and negatives are included.  Exam:  Vitals:   03/10/20 1500  BP: 126/80  Weight: 131 lb (59.4 kg)  Height: 5\' 2"  (1.575 m)   Body mass index is 23.96 kg/m.   General appearance:  Normal Thyroid:  Symmetrical, normal in size, without palpable masses or nodularity. Respiratory  Auscultation:  Clear without wheezing or rhonchi Cardiovascular  Auscultation:  Regular rate, without rubs, murmurs or gallops  Edema/varicosities:  Not grossly evident Abdominal  Soft,nontender, without masses, guarding or rebound.  Liver/spleen:  No organomegaly noted  Hernia:  None appreciated  Skin  Inspection:  Grossly normal   Breasts: Examined lying and sitting.     Right: Without masses, retractions, discharge or axillary adenopathy.     Left: Without masses, retractions, discharge or axillary adenopathy. Gentitourinary   Inguinal/mons:  Normal without inguinal adenopathy  External genitalia:  Normal  BUS/Urethra/Skene's glands:  Normal  Vagina:  Normal  Cervix: And uterus absent   Adnexa/parametria:     Rt: Without masses or tenderness.   Lt: Without masses or tenderness.  Anus and perineum: Normal  Digital rectal exam: Normal sphincter  tone without palpated masses or tenderness  Assessment/Plan:  74 y.o. MBF G3, P2 for breast and pelvic exam with no GYN complaints.  TVH for fibroids on no HRT Hypertension, hypercholesteremia, diabetes, anxiety/depression-primary care manages labs and meds IBS, GERD, fecal incontinence-GI managing HSV history no outbreaks 09/2017 benign colon polyps 5-year follow-up  Plan: SBEs, continue annual 3D screening mammogram, calcium rich foods, vitamin D 2000 IUs daily.  Repeat DEXA, normal DEXA 2016.  Encouraged regular weightbearing/balance type exercise.  Self-care, leisure activities encouraged.  Pap screening guidelines reviewed.    [name] [facility], 3:31 PM 03/10/2020

## 2020-03-10 NOTE — Patient Instructions (Signed)
Vit D 2000 iu daily Good to see you today!  Health Maintenance After Age 75 After age 62, you are at a higher risk for certain long-term diseases and infections as well as injuries from falls. Falls are a major cause of broken bones and head injuries in people who are older than age 56. Getting regular preventive care can help to keep you healthy and well. Preventive care includes getting regular testing and making lifestyle changes as recommended by your health care provider. Talk with your health care provider about:  Which screenings and tests you should have. A screening is a test that checks for a disease when you have no symptoms.  A diet and exercise plan that is right for you. What should I know about screenings and tests to prevent falls? Screening and testing are the best ways to find a health problem early. Early diagnosis and treatment give you the best chance of managing medical conditions that are common after age 75. Certain conditions and lifestyle choices may make you more likely to have a fall. Your health care provider may recommend:  Regular vision checks. Poor vision and conditions such as cataracts can make you more likely to have a fall. If you wear glasses, make sure to get your prescription updated if your vision changes.  Medicine review. Work with your health care provider to regularly review all of the medicines you are taking, including over-the-counter medicines. Ask your health care provider about any side effects that may make you more likely to have a fall. Tell your health care provider if any medicines that you take make you feel dizzy or sleepy.  Osteoporosis screening. Osteoporosis is a condition that causes the bones to get weaker. This can make the bones weak and cause them to break more easily.  Blood pressure screening. Blood pressure changes and medicines to control blood pressure can make you feel dizzy.  Strength and balance checks. Your health care  provider may recommend certain tests to check your strength and balance while standing, walking, or changing positions.  Foot health exam. Foot pain and numbness, as well as not wearing proper footwear, can make you more likely to have a fall.  Depression screening. You may be more likely to have a fall if you have a fear of falling, feel emotionally low, or feel unable to do activities that you used to do.  Alcohol use screening. Using too much alcohol can affect your balance and may make you more likely to have a fall. What actions can I take to lower my risk of falls? General instructions  Talk with your health care provider about your risks for falling. Tell your health care provider if: ? You fall. Be sure to tell your health care provider about all falls, even ones that seem minor. ? You feel dizzy, sleepy, or off-balance.  Take over-the-counter and prescription medicines only as told by your health care provider. These include any supplements.  Eat a healthy diet and maintain a healthy weight. A healthy diet includes low-fat dairy products, low-fat (lean) meats, and fiber from whole grains, beans, and lots of fruits and vegetables. Home safety  Remove any tripping hazards, such as rugs, cords, and clutter.  Install safety equipment such as grab bars in bathrooms and safety rails on stairs.  Keep rooms and walkways well-lit. Activity   Follow a regular exercise program to stay fit. This will help you maintain your balance. Ask your health care provider what types of exercise  are appropriate for you.  If you need a cane or walker, use it as recommended by your health care provider.  Wear supportive shoes that have nonskid soles. Lifestyle  Do not drink alcohol if your health care provider tells you not to drink.  If you drink alcohol, limit how much you have: ? 0-1 drink a day for women. ? 0-2 drinks a day for men.  Be aware of how much alcohol is in your drink. In the  [place] one drink equals one typical bottle of beer (12 oz), one-half glass of wine (5 oz), or one shot of hard liquor (1 oz).  Do not use any products that contain nicotine or tobacco, such as cigarettes and e-cigarettes. If you need help quitting, ask your health care provider. Summary  Having a healthy lifestyle and getting preventive care can help to protect your health and wellness after age 79.  Screening and testing are the best way to find a health problem early and help you avoid having a fall. Early diagnosis and treatment give you the best chance for managing medical conditions that are more common for people who are older than age 54.  Falls are a major cause of broken bones and head injuries in people who are older than age 77. Take precautions to prevent a fall at home.  Work with your health care provider to learn what changes you can make to improve your health and wellness and to prevent falls. This information is not intended to replace advice given to you by your health care provider. Make sure you discuss any questions you have with your health care provider. Document Revised: 04/05/2019 Document Reviewed: 10/26/2017 Elsevier Patient Education  2020 [employer].

## 2020-03-11 ENCOUNTER — Other Ambulatory Visit: Payer: Self-pay | Admitting: Women's Health

## 2020-03-11 DIAGNOSIS — R1084 Generalized abdominal pain: Secondary | ICD-10-CM | POA: Diagnosis not present

## 2020-03-11 DIAGNOSIS — Z13820 Encounter for screening for osteoporosis: Secondary | ICD-10-CM

## 2020-03-11 DIAGNOSIS — R152 Fecal urgency: Secondary | ICD-10-CM | POA: Diagnosis not present

## 2020-03-11 DIAGNOSIS — N393 Stress incontinence (female) (male): Secondary | ICD-10-CM | POA: Diagnosis not present

## 2020-03-11 DIAGNOSIS — R151 Fecal smearing: Secondary | ICD-10-CM | POA: Diagnosis not present

## 2020-03-25 DIAGNOSIS — R1084 Generalized abdominal pain: Secondary | ICD-10-CM | POA: Diagnosis not present

## 2020-03-25 DIAGNOSIS — R151 Fecal smearing: Secondary | ICD-10-CM | POA: Diagnosis not present

## 2020-03-25 DIAGNOSIS — M6281 Muscle weakness (generalized): Secondary | ICD-10-CM | POA: Diagnosis not present

## 2020-03-25 DIAGNOSIS — R152 Fecal urgency: Secondary | ICD-10-CM | POA: Diagnosis not present

## 2020-03-26 DIAGNOSIS — E785 Hyperlipidemia, unspecified: Secondary | ICD-10-CM | POA: Diagnosis not present

## 2020-03-26 DIAGNOSIS — E119 Type 2 diabetes mellitus without complications: Secondary | ICD-10-CM | POA: Diagnosis not present

## 2020-03-26 DIAGNOSIS — F419 Anxiety disorder, unspecified: Secondary | ICD-10-CM | POA: Diagnosis not present

## 2020-03-26 DIAGNOSIS — N289 Disorder of kidney and ureter, unspecified: Secondary | ICD-10-CM | POA: Diagnosis not present

## 2020-03-31 ENCOUNTER — Other Ambulatory Visit: Payer: Self-pay

## 2020-04-01 ENCOUNTER — Other Ambulatory Visit: Payer: Self-pay | Admitting: Obstetrics & Gynecology

## 2020-04-01 ENCOUNTER — Ambulatory Visit (INDEPENDENT_AMBULATORY_CARE_PROVIDER_SITE_OTHER): Payer: Medicare Other

## 2020-04-01 DIAGNOSIS — Z780 Asymptomatic menopausal state: Secondary | ICD-10-CM | POA: Diagnosis not present

## 2020-04-01 DIAGNOSIS — Z13820 Encounter for screening for osteoporosis: Secondary | ICD-10-CM

## 2020-04-14 DIAGNOSIS — M62838 Other muscle spasm: Secondary | ICD-10-CM | POA: Diagnosis not present

## 2020-04-14 DIAGNOSIS — R151 Fecal smearing: Secondary | ICD-10-CM | POA: Diagnosis not present

## 2020-04-14 DIAGNOSIS — R152 Fecal urgency: Secondary | ICD-10-CM | POA: Diagnosis not present

## 2020-04-14 DIAGNOSIS — M6281 Muscle weakness (generalized): Secondary | ICD-10-CM | POA: Diagnosis not present

## 2020-04-15 ENCOUNTER — Ambulatory Visit (INDEPENDENT_AMBULATORY_CARE_PROVIDER_SITE_OTHER): Payer: Medicare Other | Admitting: Nurse Practitioner

## 2020-04-15 ENCOUNTER — Encounter: Payer: Self-pay | Admitting: Nurse Practitioner

## 2020-04-15 ENCOUNTER — Other Ambulatory Visit (INDEPENDENT_AMBULATORY_CARE_PROVIDER_SITE_OTHER): Payer: Medicare Other

## 2020-04-15 VITALS — BP 134/68 | HR 72 | Temp 98.7°F | Ht 62.0 in | Wt 129.0 lb

## 2020-04-15 DIAGNOSIS — K623 Rectal prolapse: Secondary | ICD-10-CM

## 2020-04-15 DIAGNOSIS — R159 Full incontinence of feces: Secondary | ICD-10-CM | POA: Diagnosis not present

## 2020-04-15 DIAGNOSIS — R1084 Generalized abdominal pain: Secondary | ICD-10-CM

## 2020-04-15 LAB — COMPREHENSIVE METABOLIC PANEL
ALT: 8 U/L (ref 0–35)
AST: 12 U/L (ref 0–37)
Albumin: 4.2 g/dL (ref 3.5–5.2)
Alkaline Phosphatase: 54 U/L (ref 39–117)
BUN: 16 mg/dL (ref 6–23)
CO2: 31 mEq/L (ref 19–32)
Calcium: 9.3 mg/dL (ref 8.4–10.5)
Chloride: 107 mEq/L (ref 96–112)
Creatinine, Ser: 0.81 mg/dL (ref 0.40–1.20)
GFR: 83.56 mL/min (ref >60.00)
Glucose, Bld: 109 mg/dL — ABNORMAL HIGH (ref 70–99)
Potassium: 4.4 mEq/L (ref 3.5–5.1)
Sodium: 145 mEq/L (ref 135–145)
Total Bilirubin: 0.4 mg/dL (ref 0.2–1.2)
Total Protein: 6.8 g/dL (ref 6.0–8.3)

## 2020-04-15 LAB — CBC WITH DIFFERENTIAL/PLATELET
Basophils Absolute: 0 10*3/uL (ref 0.0–0.1)
Basophils Relative: 0.8 % (ref 0.0–3.0)
Eosinophils Absolute: 0 10*3/uL (ref 0.0–0.7)
Eosinophils Relative: 0.7 % (ref 0.0–5.0)
HCT: 39.4 % (ref 36.0–46.0)
Hemoglobin: 13 g/dL (ref 12.0–15.0)
Lymphocytes Relative: 17.2 % (ref 12.0–46.0)
Lymphs Abs: 1 10*3/uL (ref 0.7–4.0)
MCHC: 33 g/dL (ref 30.0–36.0)
MCV: 90.9 fl (ref 78.0–100.0)
Monocytes Absolute: 0.4 10*3/uL (ref 0.1–1.0)
Monocytes Relative: 7.4 % (ref 3.0–12.0)
Neutro Abs: 4.5 10*3/uL (ref 1.4–7.7)
Neutrophils Relative %: 73.9 % (ref 43.0–77.0)
Platelets: 192 10*3/uL (ref 150.0–400.0)
RBC: 4.33 Mil/uL (ref 3.87–5.11)
RDW: 15.1 % (ref 11.5–15.5)
WBC: 6.1 10*3/uL (ref 4.0–10.5)

## 2020-04-15 LAB — C-REACTIVE PROTEIN: CRP: <1 mg/dL (ref 0.5–20.0)

## 2020-04-15 NOTE — Progress Notes (Signed)
04/15/2020 Kristen Mahoney OF:[id number] Feb 24, 1946   Chief Complaint: abdominal pain, loose stools   History of Present Illness: Kristen Mahoney is a 74 year old female with a past medical history of anxiety, arthritis, hypertension, diabetes mellitus type 2, OSA on CPAP, kidney stones and GERD. Past cholecystectomy. S/P hysterectomy complicated with vaginal fistula with repair in 2010, incompetent anal sphincter with partial rectal prolapse s/p anorectal band ligation by Dr. Marcello Moores.  She is undergoing rectal/pelvic  physical therapy twice weekly. She presents today for further follow up regarding abdominal pain and loose stools with fecal incontinence. Obtaining her symptoms history is challenging. She has chronic variable abdominal pain and loose stools. She reports her lower abdominal pain never completely goes away and she is noticing more LLQ pain at times. No severe abdominal pain. She is concerned that her stomach makes a growling noise. She takes Imodium 1 tab bid on and off. She sometimes takes Pepto bismal on and off. She has fecal incontinence of loose stools approximately 3 times weekly. She infrequently has incontinence of solid stools. She takes a fiber infrequently takes a fiber supplement but acknowledges she was advised to take it daily. She had a solid stool yesterday but feels constipated today. Her most recent colonoscopy was 10/12/2020 which showed diverticulosis to the descending and sigmoid colon, dimininshed anal sphincter tone noted. She occasionally sees a small amount of red blood on the toilet tissue. She reported having labs and abdominal imaging done by her urologist, Dr. Alyson Ingles approximately 2 months ago. I will request a copy of these results for further review. She has lost 2 lbs. She received her Covid vaccination Feb and March 2021.   CT Renal Stone Study 09/07/2019: Bilateral nephrolithiasis is noted. No hydronephrosis or renal obstruction is noted. Linear hyperdensity  is noted posteriorly in the urinary bladder which most likely represents blood, but possible mass cannot be excluded. Cystoscopy is recommended for further evaluation. Aortic Atherosclerosis    Colonoscopy 10/12/2017: - Decreased sphincter tone found on digital rectal exam. - Diverticulosis in the sigmoid colon and in the descending colon. - The examination was otherwise normal. - No specimens collected.  Past Medical History:  Diagnosis Date  . Allergy    seasonal  . Anxiety   . Arthritis    knees,all over  . Basal cell carcinoma   . Benign breast cyst in female    PATIENT HAS HISTORY OF BREAST CYSTS  . Cataract    beginning bilateral  . Diabetes mellitus   . GERD (gastroesophageal reflux disease)   . Herpes progenitalis   . Hyperlipidemia   . Hypertension   . Kidney stone   . OSA on CPAP   . Sleep apnea    cpap  . Urinary, incontinence, stress female   . Vertigo       Current Medications, Allergies, Past Medical History, Past Surgical History, Family History and Social History were reviewed in [employer] record.   Physical Exam: BP 134/68   Pulse 72   Temp 98.7 F (37.1 C)   Ht 5\' 2"  (1.575 m)   Wt 129 lb (58.5 kg)   BMI 23.59 kg/m   General: Well developed 74 year old female in no acute distress. Head: Normocephalic and atraumatic. Eyes: No scleral icterus. Conjunctiva pink . Lungs: Clear throughout to auscultation. Heart: Regular rate and rhythm, no murmur. Abdomen: Soft, nondistended. Generalized tenderness without rebound or guarding. No masses or hepatomegaly. Normal bowel sounds x 4  quadrants.  Rectal: Small amount of rectal prolapse on exam, tucked back inside anal opening. No stool in the rectal vault. Diminished anal sphincter tone.  Musculoskeletal: Symmetrical with no gross deformities. Extremities: No edema. Neurological: Alert oriented x 4. No focal deficits.  Psychological: Alert and cooperative. Normal mood and  affect  Assessment and Recommendations:  54. 74 year old female with IBS and fecal incontinence in setting or rectal prolapse. Decreased rectal bleeding.  -Benefiber daily  -Imodium 1 tab po bid, may add Pepto bismal 1 capful bid if needed. Stop if no BM in 24 hours -Instructed to use Vaseline to tuck in rectal tissue when felt prolapsed  -Avoid aggressive wiping  -Request records from Dr. Alyson Ingles urologist  -Follow up in the office in 3 months   2. Chronic abdominal pain lower abd / upper abd -CBC, CMP and CRP -Patient to call our office if her symptoms worsen  -CTAP if abdominal pain worsens   3. Colorectal cancer screening  -Next colonoscopy due 09/2022

## 2020-04-15 NOTE — Patient Instructions (Addendum)
If you are age 75 or older, your body mass index should be between 23-30. Your Body mass index is 23.59 kg/m. If this is out of the aforementioned range listed, please consider follow up with your Primary Care Provider.  If you are age 37 or younger, your body mass index should be between 19-25. Your Body mass index is 23.59 kg/m. If this is out of the aformentioned range listed, please consider follow up with your Primary Care Provider.   Your provider has requested that you go to the basement level for lab work before leaving today. Press "B" on the elevator. The lab is located at the first door on the left as you exit the elevator.  1. Will are requesting your most recent labs and CT scan from your Urologist. 2. Please take Benefiber 1 tablespoon daily 3. Imodium 1 tablet twice daily as needed. If you have no bowel movements in 24 hours please stop taking Imodium. 4. You may take Pepto Bismal 1 capful twice daily if needed. If no bowel movements in 24 hours please stop taking the Pepto Bismal. 4. Follow up with Dr Silverio Decamp in 3 months, call to schedule in 3 months.  Due to recent changes in healthcare laws, you may see the results of your imaging and laboratory studies on MyChart before your provider has had a chance to review them.  We understand that in some cases there may be results that are confusing or concerning to you. Not all laboratory results come back in the same time frame and the provider may be waiting for multiple results in order to interpret others.  Please give [place] 48 hours in order for your provider to thoroughly review all the results before contacting the office for clarification of your results.   Thank you for choosing [facility] Noralyn Pick, CRNP

## 2020-04-16 NOTE — Progress Notes (Signed)
Reviewed and agree with documentation and assessment and plan. K. Veena Haldon Carley , MD   

## 2020-04-17 DIAGNOSIS — E119 Type 2 diabetes mellitus without complications: Secondary | ICD-10-CM | POA: Diagnosis not present

## 2020-04-17 DIAGNOSIS — E785 Hyperlipidemia, unspecified: Secondary | ICD-10-CM | POA: Diagnosis not present

## 2020-04-17 DIAGNOSIS — I10 Essential (primary) hypertension: Secondary | ICD-10-CM | POA: Diagnosis not present

## 2020-04-17 DIAGNOSIS — J309 Allergic rhinitis, unspecified: Secondary | ICD-10-CM | POA: Diagnosis not present

## 2020-04-21 DIAGNOSIS — R152 Fecal urgency: Secondary | ICD-10-CM | POA: Diagnosis not present

## 2020-04-21 DIAGNOSIS — R1084 Generalized abdominal pain: Secondary | ICD-10-CM | POA: Diagnosis not present

## 2020-04-21 DIAGNOSIS — R151 Fecal smearing: Secondary | ICD-10-CM | POA: Diagnosis not present

## 2020-04-21 DIAGNOSIS — M62838 Other muscle spasm: Secondary | ICD-10-CM | POA: Diagnosis not present

## 2020-04-29 ENCOUNTER — Telehealth: Payer: Self-pay | Admitting: Nurse Practitioner

## 2020-04-29 NOTE — Telephone Encounter (Signed)
  Spoke with the patient. She describes a pain she calls "digging" in her right side. Sometimes this pain produces a bowel movement which she has noticed helps decrease the discomfort. She states she moves formed stool every day. She may move her bowels 2 or 3 times. She can take the Levsin or she may take Pepto-Bismol and feel better. She reports"this has been going on." The patient wants to know if she should have "some kind of imaging or something." No bloody bowel movement. No vomiting. She eats daily but says she gets nauseous.  Please advise.

## 2020-04-30 DIAGNOSIS — M62838 Other muscle spasm: Secondary | ICD-10-CM | POA: Diagnosis not present

## 2020-04-30 DIAGNOSIS — R152 Fecal urgency: Secondary | ICD-10-CM | POA: Diagnosis not present

## 2020-04-30 DIAGNOSIS — R151 Fecal smearing: Secondary | ICD-10-CM | POA: Diagnosis not present

## 2020-04-30 DIAGNOSIS — M6281 Muscle weakness (generalized): Secondary | ICD-10-CM | POA: Diagnosis not present

## 2020-04-30 NOTE — Telephone Encounter (Signed)
I called the patient and I left a msg on her voicemail advising pt to take Ibgard otc 1 po bid. At that time of her last office visit she indicated having abd imaging by her urologist, I requested these records, not yet received. I advised the patient to call me back, if she has significant abd pain and nauseas then an abd/pelvic CT with contrast would be appropriate. Recent labs show normal Bun/cr. Await patient's return call.

## 2020-05-06 DIAGNOSIS — E119 Type 2 diabetes mellitus without complications: Secondary | ICD-10-CM | POA: Diagnosis not present

## 2020-05-06 DIAGNOSIS — H35363 Drusen (degenerative) of macula, bilateral: Secondary | ICD-10-CM | POA: Diagnosis not present

## 2020-05-06 DIAGNOSIS — H35033 Hypertensive retinopathy, bilateral: Secondary | ICD-10-CM | POA: Diagnosis not present

## 2020-05-06 DIAGNOSIS — H16223 Keratoconjunctivitis sicca, not specified as Sjogren's, bilateral: Secondary | ICD-10-CM | POA: Diagnosis not present

## 2020-05-16 DIAGNOSIS — R1084 Generalized abdominal pain: Secondary | ICD-10-CM | POA: Diagnosis not present

## 2020-05-16 DIAGNOSIS — M6289 Other specified disorders of muscle: Secondary | ICD-10-CM | POA: Diagnosis not present

## 2020-05-16 DIAGNOSIS — R152 Fecal urgency: Secondary | ICD-10-CM | POA: Diagnosis not present

## 2020-05-16 DIAGNOSIS — R151 Fecal smearing: Secondary | ICD-10-CM | POA: Diagnosis not present

## 2020-05-28 DIAGNOSIS — R151 Fecal smearing: Secondary | ICD-10-CM | POA: Diagnosis not present

## 2020-05-28 DIAGNOSIS — M6289 Other specified disorders of muscle: Secondary | ICD-10-CM | POA: Diagnosis not present

## 2020-05-28 DIAGNOSIS — R1084 Generalized abdominal pain: Secondary | ICD-10-CM | POA: Diagnosis not present

## 2020-05-28 DIAGNOSIS — R152 Fecal urgency: Secondary | ICD-10-CM | POA: Diagnosis not present

## 2020-05-30 ENCOUNTER — Telehealth: Payer: Self-pay | Admitting: *Deleted

## 2020-05-30 NOTE — Telephone Encounter (Signed)
Patient called and left message stating she has not heard from [facility] regarding scheduling screening mammogram. I called patient back and received her voicemail I gave her to number to [facility] to call and schedule.

## 2020-06-03 DIAGNOSIS — M6281 Muscle weakness (generalized): Secondary | ICD-10-CM | POA: Diagnosis not present

## 2020-06-03 DIAGNOSIS — R1084 Generalized abdominal pain: Secondary | ICD-10-CM | POA: Diagnosis not present

## 2020-06-03 DIAGNOSIS — R152 Fecal urgency: Secondary | ICD-10-CM | POA: Diagnosis not present

## 2020-06-03 DIAGNOSIS — R151 Fecal smearing: Secondary | ICD-10-CM | POA: Diagnosis not present

## 2020-06-16 DIAGNOSIS — R152 Fecal urgency: Secondary | ICD-10-CM | POA: Diagnosis not present

## 2020-06-16 DIAGNOSIS — M6289 Other specified disorders of muscle: Secondary | ICD-10-CM | POA: Diagnosis not present

## 2020-06-16 DIAGNOSIS — R151 Fecal smearing: Secondary | ICD-10-CM | POA: Diagnosis not present

## 2020-06-16 DIAGNOSIS — R1084 Generalized abdominal pain: Secondary | ICD-10-CM | POA: Diagnosis not present

## 2020-07-02 DIAGNOSIS — M6281 Muscle weakness (generalized): Secondary | ICD-10-CM | POA: Diagnosis not present

## 2020-07-02 DIAGNOSIS — R151 Fecal smearing: Secondary | ICD-10-CM | POA: Diagnosis not present

## 2020-07-02 DIAGNOSIS — R152 Fecal urgency: Secondary | ICD-10-CM | POA: Diagnosis not present

## 2020-07-02 DIAGNOSIS — R1084 Generalized abdominal pain: Secondary | ICD-10-CM | POA: Diagnosis not present

## 2020-07-07 ENCOUNTER — Other Ambulatory Visit: Payer: Self-pay | Admitting: Gastroenterology

## 2020-07-07 DIAGNOSIS — Z1231 Encounter for screening mammogram for malignant neoplasm of breast: Secondary | ICD-10-CM | POA: Diagnosis not present

## 2020-07-08 DIAGNOSIS — R152 Fecal urgency: Secondary | ICD-10-CM | POA: Diagnosis not present

## 2020-07-08 DIAGNOSIS — R151 Fecal smearing: Secondary | ICD-10-CM | POA: Diagnosis not present

## 2020-07-08 DIAGNOSIS — M6281 Muscle weakness (generalized): Secondary | ICD-10-CM | POA: Diagnosis not present

## 2020-07-08 DIAGNOSIS — R1084 Generalized abdominal pain: Secondary | ICD-10-CM | POA: Diagnosis not present

## 2020-07-17 DIAGNOSIS — R0789 Other chest pain: Secondary | ICD-10-CM | POA: Diagnosis not present

## 2020-07-17 DIAGNOSIS — E119 Type 2 diabetes mellitus without complications: Secondary | ICD-10-CM | POA: Diagnosis not present

## 2020-07-17 DIAGNOSIS — E785 Hyperlipidemia, unspecified: Secondary | ICD-10-CM | POA: Diagnosis not present

## 2020-07-17 DIAGNOSIS — I10 Essential (primary) hypertension: Secondary | ICD-10-CM | POA: Diagnosis not present

## 2020-07-22 DIAGNOSIS — R1084 Generalized abdominal pain: Secondary | ICD-10-CM | POA: Diagnosis not present

## 2020-07-22 DIAGNOSIS — R152 Fecal urgency: Secondary | ICD-10-CM | POA: Diagnosis not present

## 2020-07-22 DIAGNOSIS — R151 Fecal smearing: Secondary | ICD-10-CM | POA: Diagnosis not present

## 2020-07-22 DIAGNOSIS — M6281 Muscle weakness (generalized): Secondary | ICD-10-CM | POA: Diagnosis not present

## 2020-07-31 DIAGNOSIS — N3021 Other chronic cystitis with hematuria: Secondary | ICD-10-CM | POA: Diagnosis not present

## 2020-07-31 DIAGNOSIS — N200 Calculus of kidney: Secondary | ICD-10-CM | POA: Diagnosis not present

## 2020-07-31 DIAGNOSIS — R102 Pelvic and perineal pain: Secondary | ICD-10-CM | POA: Diagnosis not present

## 2020-08-05 DIAGNOSIS — E119 Type 2 diabetes mellitus without complications: Secondary | ICD-10-CM | POA: Diagnosis not present

## 2020-08-05 DIAGNOSIS — H35363 Drusen (degenerative) of macula, bilateral: Secondary | ICD-10-CM | POA: Diagnosis not present

## 2020-08-05 DIAGNOSIS — H16223 Keratoconjunctivitis sicca, not specified as Sjogren's, bilateral: Secondary | ICD-10-CM | POA: Diagnosis not present

## 2020-08-05 DIAGNOSIS — H35033 Hypertensive retinopathy, bilateral: Secondary | ICD-10-CM | POA: Diagnosis not present

## 2020-08-11 DIAGNOSIS — R152 Fecal urgency: Secondary | ICD-10-CM | POA: Diagnosis not present

## 2020-08-11 DIAGNOSIS — R151 Fecal smearing: Secondary | ICD-10-CM | POA: Diagnosis not present

## 2020-08-11 DIAGNOSIS — R102 Pelvic and perineal pain: Secondary | ICD-10-CM | POA: Diagnosis not present

## 2020-08-11 DIAGNOSIS — R1084 Generalized abdominal pain: Secondary | ICD-10-CM | POA: Diagnosis not present

## 2020-08-21 DIAGNOSIS — R152 Fecal urgency: Secondary | ICD-10-CM | POA: Diagnosis not present

## 2020-08-21 DIAGNOSIS — R151 Fecal smearing: Secondary | ICD-10-CM | POA: Diagnosis not present

## 2020-08-21 DIAGNOSIS — R1084 Generalized abdominal pain: Secondary | ICD-10-CM | POA: Diagnosis not present

## 2020-08-21 DIAGNOSIS — N3021 Other chronic cystitis with hematuria: Secondary | ICD-10-CM | POA: Diagnosis not present

## 2020-08-26 DIAGNOSIS — E119 Type 2 diabetes mellitus without complications: Secondary | ICD-10-CM | POA: Diagnosis not present

## 2020-08-26 DIAGNOSIS — H35033 Hypertensive retinopathy, bilateral: Secondary | ICD-10-CM | POA: Diagnosis not present

## 2020-08-26 DIAGNOSIS — H16223 Keratoconjunctivitis sicca, not specified as Sjogren's, bilateral: Secondary | ICD-10-CM | POA: Diagnosis not present

## 2020-08-26 DIAGNOSIS — H35363 Drusen (degenerative) of macula, bilateral: Secondary | ICD-10-CM | POA: Diagnosis not present

## 2020-08-28 DIAGNOSIS — Z85828 Personal history of other malignant neoplasm of skin: Secondary | ICD-10-CM | POA: Diagnosis not present

## 2020-08-28 DIAGNOSIS — R208 Other disturbances of skin sensation: Secondary | ICD-10-CM | POA: Diagnosis not present

## 2020-08-28 DIAGNOSIS — B029 Zoster without complications: Secondary | ICD-10-CM | POA: Diagnosis not present

## 2020-09-03 DIAGNOSIS — M545 Low back pain: Secondary | ICD-10-CM | POA: Diagnosis not present

## 2020-09-03 DIAGNOSIS — R1084 Generalized abdominal pain: Secondary | ICD-10-CM | POA: Diagnosis not present

## 2020-09-03 DIAGNOSIS — N200 Calculus of kidney: Secondary | ICD-10-CM | POA: Diagnosis not present

## 2020-09-03 DIAGNOSIS — R102 Pelvic and perineal pain: Secondary | ICD-10-CM | POA: Diagnosis not present

## 2020-09-18 DIAGNOSIS — R1084 Generalized abdominal pain: Secondary | ICD-10-CM | POA: Diagnosis not present

## 2020-09-18 DIAGNOSIS — N3021 Other chronic cystitis with hematuria: Secondary | ICD-10-CM | POA: Diagnosis not present

## 2020-09-18 DIAGNOSIS — M545 Low back pain: Secondary | ICD-10-CM | POA: Diagnosis not present

## 2020-09-18 DIAGNOSIS — N760 Acute vaginitis: Secondary | ICD-10-CM | POA: Diagnosis not present

## 2020-09-24 DIAGNOSIS — M47816 Spondylosis without myelopathy or radiculopathy, lumbar region: Secondary | ICD-10-CM | POA: Diagnosis not present

## 2020-10-07 DIAGNOSIS — F419 Anxiety disorder, unspecified: Secondary | ICD-10-CM | POA: Diagnosis not present

## 2020-10-07 DIAGNOSIS — E785 Hyperlipidemia, unspecified: Secondary | ICD-10-CM | POA: Diagnosis not present

## 2020-10-07 DIAGNOSIS — N289 Disorder of kidney and ureter, unspecified: Secondary | ICD-10-CM | POA: Diagnosis not present

## 2020-10-07 DIAGNOSIS — E119 Type 2 diabetes mellitus without complications: Secondary | ICD-10-CM | POA: Diagnosis not present

## 2020-10-09 DIAGNOSIS — M6281 Muscle weakness (generalized): Secondary | ICD-10-CM | POA: Diagnosis not present

## 2020-10-09 DIAGNOSIS — M6289 Other specified disorders of muscle: Secondary | ICD-10-CM | POA: Diagnosis not present

## 2020-10-09 DIAGNOSIS — M545 Low back pain, unspecified: Secondary | ICD-10-CM | POA: Diagnosis not present

## 2020-10-09 DIAGNOSIS — R151 Fecal smearing: Secondary | ICD-10-CM | POA: Diagnosis not present

## 2020-10-15 DIAGNOSIS — M47816 Spondylosis without myelopathy or radiculopathy, lumbar region: Secondary | ICD-10-CM | POA: Diagnosis not present

## 2020-10-23 DIAGNOSIS — I10 Essential (primary) hypertension: Secondary | ICD-10-CM | POA: Diagnosis not present

## 2020-10-23 DIAGNOSIS — R0789 Other chest pain: Secondary | ICD-10-CM | POA: Diagnosis not present

## 2020-10-23 DIAGNOSIS — E785 Hyperlipidemia, unspecified: Secondary | ICD-10-CM | POA: Diagnosis not present

## 2020-10-23 DIAGNOSIS — E119 Type 2 diabetes mellitus without complications: Secondary | ICD-10-CM | POA: Diagnosis not present

## 2020-10-27 DIAGNOSIS — E119 Type 2 diabetes mellitus without complications: Secondary | ICD-10-CM | POA: Diagnosis not present

## 2020-10-27 DIAGNOSIS — E785 Hyperlipidemia, unspecified: Secondary | ICD-10-CM | POA: Diagnosis not present

## 2020-10-27 DIAGNOSIS — I10 Essential (primary) hypertension: Secondary | ICD-10-CM | POA: Diagnosis not present

## 2020-11-07 ENCOUNTER — Encounter: Payer: Self-pay | Admitting: Obstetrics and Gynecology

## 2020-11-07 ENCOUNTER — Other Ambulatory Visit: Payer: Self-pay

## 2020-11-07 ENCOUNTER — Ambulatory Visit (INDEPENDENT_AMBULATORY_CARE_PROVIDER_SITE_OTHER): Payer: Medicare Other | Admitting: Obstetrics and Gynecology

## 2020-11-07 VITALS — BP 118/74

## 2020-11-07 DIAGNOSIS — N898 Other specified noninflammatory disorders of vagina: Secondary | ICD-10-CM | POA: Diagnosis not present

## 2020-11-07 DIAGNOSIS — R350 Frequency of micturition: Secondary | ICD-10-CM | POA: Diagnosis not present

## 2020-11-07 DIAGNOSIS — B9689 Other specified bacterial agents as the cause of diseases classified elsewhere: Secondary | ICD-10-CM | POA: Diagnosis not present

## 2020-11-07 DIAGNOSIS — N760 Acute vaginitis: Secondary | ICD-10-CM | POA: Diagnosis not present

## 2020-11-07 LAB — WET PREP FOR TRICH, YEAST, CLUE

## 2020-11-07 MED ORDER — METRONIDAZOLE 500 MG PO TABS: 500.0000 mg | ORAL_TABLET | Freq: Two times a day (BID) | ORAL | 0 refills | Status: DC

## 2020-11-07 NOTE — Progress Notes (Signed)
Kristen Mahoney 1946-12-10 [id number]  SUBJECTIVE:  74 y.o. [id number] female presents for a several day history of mid back pain and also some lower pelvic pain. No vaginal discharge. Also has been noticing somewhat increased urinary frequency. She did have a cortisone injection in her back the other week to address her back pain.  Current Outpatient Medications  Medication Sig Dispense Refill  . acetaminophen (TYLENOL) 500 MG tablet Take 1,000 mg by mouth every 6 (six) hours as needed (for pain.).    [name] Kitchen aspirin EC 81 MG tablet Take 81 mg by mouth every evening.     [name] Kitchen atorvastatin (LIPITOR) 20 MG tablet Take 20 mg by mouth daily.     [name] Kitchen buPROPion (WELLBUTRIN XL) 150 MG 24 hr tablet Take 150 mg by mouth daily.    . fluticasone (FLONASE) 50 MCG/ACT nasal spray Place 1 spray into both nostrils daily as needed for allergies.     . hydrocortisone 1 % ointment Apply 1 application topically 3 (three) times daily. (Patient taking differently: Apply 1 application topically 3 (three) times daily as needed for itching. ) 30 g 0  . hyoscyamine (LEVSIN SL) 0.125 MG SL tablet DISSOLVE 1 TABLET IN MOUTH EVERY 6 HOURS AS NEEDED FOR CRAMPS 270 tablet 0  . loratadine (CLARITIN) 10 MG tablet Take 10 mg by mouth daily as needed for allergies.    . metFORMIN (GLUCOPHAGE-XR) 500 MG 24 hr tablet Take 500 mg by mouth daily.     . metoprolol succinate (TOPROL XL) 50 MG 24 hr tablet Take 1 tablet (50 mg total) by mouth daily. Take with or immediately following a meal. 30 tablet 0  . Multiple Vitamins-Minerals (ICAPS) CAPS Take 1 capsule by mouth 2 (two) times daily.     [name] Kitchen omeprazole (PRILOSEC) 20 MG capsule Take 1 capsule (20 mg total) by mouth 2 (two) times daily before a meal. 180 capsule 3  . RESTASIS 0.05 % ophthalmic emulsion Place 1 drop into both eyes every 12 (twelve) hours.    . valACYclovir (VALTREX) 500 MG tablet Take one tablet twice daily for 3-5 days as needed with an outbreak, then daily as needed (Patient  taking differently: Take 500 mg by mouth 2 (two) times daily as needed (outbreak). ) 30 tablet 12  . Vitamin D, Cholecalciferol, 400 units TABS Take 400 Units by mouth daily.     . vitamin E (VITAMIN E) 400 UNIT capsule Take 400 Units by mouth 2 (two) times daily.     . nitrofurantoin (MACRODANTIN) 25 MG capsule Take 25 mg by mouth at bedtime. (Patient not taking: Reported on 11/07/2020)     Current Facility-Administered Medications  Medication Dose Route Frequency Provider Last Rate Last Admin  . 0.9 %  sodium chloride infusion  500 mL Intravenous Continuous Nandigam, Kavitha V, MD      . betamethasone acetate-betamethasone sodium phosphate (CELESTONE) injection 3 mg  3 mg Intramuscular Once Edrick Kins, DPM       Allergies: Ciprofloxacin and Morphine and related  No LMP recorded. Patient has had a hysterectomy.  Past medical history,surgical history, problem list, medications, allergies, family history and social history were all reviewed and documented as reviewed in the EPIC chart.  ROS: Positives and negatives as reviewed in HPI  OBJECTIVE:  BP 118/74  The patient appears well, alert, oriented x 3, in no distress. Back: Mild paraspinous muscle tenderness on the left side at the mid spine PELVIC EXAM: VULVA: normal appearing vulva with no masses,  tenderness or lesions, VAGINA: normal appearing vagina with normal color and thick white-gray discharge, no lesions, CERVIX: surgically absent, UTERUS: surgically absent, vaginal cuff nontender, ADNEXA: normal adnexa in size, nontender and no masses, Urinalysis unremarkable WET MOUNT done - + clue cells, no yeast or trichomonas, no WBC  Chaperone: Caryn Bee present during the examination  ASSESSMENT:  74 y.o. [id number] here with bacterial vaginosis  PLAN:  Discussed the diagnosis of BV and this could account for some of her symptoms including the pelvic pain and urinary frequency. Will treat with metronidazole 5 mg twice daily x7 days  to help restore vaginal flora balance. This is not the cause of her mid back pain and if that is bothering her then I would encourage her to check in with her regular provider regarding that issue. We discussed potential side effects and avoidance of alcohol while using metronidazole, she sounded to be familiar with the medication. Also offered alternative leave the vaginal MetroGel but she preferred the pill. Condom use with intercourse may also reduce frequency of recurrence. Will follow-up if no improvement in her pelvic discomfort symptoms.  [name] 11/07/20

## 2020-11-09 LAB — URINALYSIS, COMPLETE W/RFL CULTURE
Bilirubin Urine: NEGATIVE
Glucose, UA: NEGATIVE
Hgb urine dipstick: NEGATIVE
Leukocyte Esterase: NEGATIVE
Nitrites, Initial: NEGATIVE
Protein, ur: NEGATIVE
RBC / HPF: NONE SEEN /HPF (ref 0–2)
Specific Gravity, Urine: 1.025 (ref 1.001–1.03)
pH: 5 (ref 5.0–8.0)

## 2020-11-09 LAB — CULTURE INDICATED

## 2020-11-09 LAB — URINE CULTURE
MICRO NUMBER:: 11196973
SPECIMEN QUALITY:: ADEQUATE

## 2020-12-17 ENCOUNTER — Telehealth: Payer: Self-pay | Admitting: Nurse Practitioner

## 2020-12-17 NOTE — Telephone Encounter (Signed)
Pt is requesting a call back from a nurse to be seen ASAP for the cyst outside of her rectum

## 2020-12-17 NOTE — Telephone Encounter (Signed)
Patient describes a small egg size knot under the skin opposite the rectum. It is not in or at the rectum. She states "I think it is a boil. I have had one before and it had to be lanced." She is putting "boil cream"on it. Advised warm compress as well. Advised to contact her PCP. GI is not equipped to treat this type of problem in this office. She is afebrile. Admits to discomfort. Some loose stools but this is a chronic issue. Patient agrees to this plan and will contact her PCP for help.

## 2020-12-30 ENCOUNTER — Encounter: Payer: Self-pay | Admitting: Nurse Practitioner

## 2020-12-30 ENCOUNTER — Other Ambulatory Visit: Payer: Self-pay

## 2020-12-30 ENCOUNTER — Ambulatory Visit (INDEPENDENT_AMBULATORY_CARE_PROVIDER_SITE_OTHER): Payer: Medicare Other | Admitting: Nurse Practitioner

## 2020-12-30 VITALS — BP 120/82

## 2020-12-30 DIAGNOSIS — L0291 Cutaneous abscess, unspecified: Secondary | ICD-10-CM

## 2020-12-30 DIAGNOSIS — Z113 Encounter for screening for infections with a predominantly sexual mode of transmission: Secondary | ICD-10-CM | POA: Diagnosis not present

## 2020-12-30 MED ORDER — CEPHALEXIN 500 MG PO CAPS: 500.0000 mg | ORAL_CAPSULE | Freq: Four times a day (QID) | ORAL | 0 refills | Status: AC

## 2020-12-30 NOTE — Patient Instructions (Signed)
Skin Abscess  A skin abscess is an infected area on or under your skin that contains a collection of pus and other material. An abscess may also be called a furuncle, carbuncle, or boil. An abscess can occur in or on almost any part of your body. Some abscesses break open (rupture) on their own. Most continue to get worse unless they are treated. The infection can spread deeper into the body and eventually into your blood, which can make you feel ill. Treatment usually involves draining the abscess. What are the causes? An abscess occurs when germs, like bacteria, pass through your skin and cause an infection. This may be caused by:  A scrape or cut on your skin.  A puncture wound through your skin, including a needle injection or insect bite.  Blocked oil or sweat glands.  Blocked and infected hair follicles.  A cyst that forms beneath your skin (sebaceous cyst) and becomes infected. What increases the risk? This condition is more likely to develop in people who:  Have a weak body defense system (immune system).  Have diabetes.  Have dry and irritated skin.  Get frequent injections or use illegal IV drugs.  Have a foreign body in a wound, such as a splinter.  Have problems with their lymph system or veins. What are the signs or symptoms? Symptoms of this condition include:  A painful, firm bump under the skin.  A bump with pus at the top. This may break through the skin and drain. Other symptoms include:  Redness surrounding the abscess site.  Warmth.  Swelling of the lymph nodes (glands) near the abscess.  Tenderness.  A sore on the skin. How is this diagnosed? This condition may be diagnosed based on:  A physical exam.  Your medical history.  A sample of pus. This may be used to find out what is causing the infection.  Blood tests.  Imaging tests, such as an ultrasound, CT scan, or MRI. How is this treated? A small abscess that drains on its own may  not need treatment. Treatment for larger abscesses may include:  Moist heat or heat pack applied to the area several times a day.  A procedure to drain the abscess (incision and drainage).  Antibiotic medicines. For a severe abscess, you may first get antibiotics through an IV and then change to antibiotics by mouth. Follow these instructions at home: Medicines   Take over-the-counter and prescription medicines only as told by your health care provider.  If you were prescribed an antibiotic medicine, take it as told by your health care provider. Do not stop taking the antibiotic even if you start to feel better. Abscess care   If you have an abscess that has not drained, apply heat to the affected area. Use the heat source that your health care provider recommends, such as a moist heat pack or a heating pad. ? Place a towel between your skin and the heat source. ? Leave the heat on for 20-30 minutes. ? Remove the heat if your skin turns bright red. This is especially important if you are unable to feel pain, heat, or cold. You may have a greater risk of getting burned.  Follow instructions from your health care provider about how to take care of your abscess. Make sure you: ? Cover the abscess with a bandage (dressing). ? Change your dressing or gauze as told by your health care provider. ? Wash your hands with soap and water before you change the   dressing or gauze. If soap and water are not available, use hand sanitizer.  Check your abscess every day for signs of a worsening infection. Check for: ? More redness, swelling, or pain. ? More fluid or blood. ? Warmth. ? More pus or a bad smell. General instructions  To avoid spreading the infection: ? Do not share personal care items, towels, or hot tubs with others. ? Avoid making skin contact with other people.  Keep all follow-up visits as told by your health care provider. This is important. Contact a health care provider if  you have:  More redness, swelling, or pain around your abscess.  More fluid or blood coming from your abscess.  Warm skin around your abscess.  More pus or a bad smell coming from your abscess.  A fever.  Muscle aches.  Chills or a general ill feeling. Get help right away if you:  Have severe pain.  See red streaks on your skin spreading away from the abscess. Summary  A skin abscess is an infected area on or under your skin that contains a collection of pus and other material.  A small abscess that drains on its own may not need treatment.  Treatment for larger abscesses may include having a procedure to drain the abscess and taking an antibiotic. This information is not intended to replace advice given to you by your health care provider. Make sure you discuss any questions you have with your health care provider. Document Revised: 04/05/2019 Document Reviewed: 01/26/2018 Elsevier Patient Education  2020 [employer]  

## 2020-12-30 NOTE — Progress Notes (Signed)
   Acute Office Visit  Subjective:    Patient ID: JEWELIA BOCCHINO, female    DOB: [birth year], 75 y.o.   MRN: [id number]   HPI 75 y.o. presents today for vaginal bump that she noticed about 2 weeks ago.  It is painful with sitting, without drainage. She does not feel it has increased in size. Sexually active with husband, history of STDs despite husband reporting no infidelity.    Review of Systems  Constitutional: Negative.   Genitourinary: Positive for vaginal pain. Negative for vaginal discharge.  Skin: Positive for wound (perineum).       Objective:    Physical Exam Constitutional:      Appearance: Normal appearance.  Genitourinary:   Skin:    Findings: Abscess present.     BP 120/82 (BP Location: Right Arm, Patient Position: Sitting, Cuff Size: Normal)  Wt Readings from Last 3 Encounters:  04/15/20 129 lb (58.5 kg)  03/10/20 131 lb (59.4 kg)  02/13/20 130 lb (59 kg)        Assessment & Plan:   Problem List Items Addressed This Visit   None   Visit Diagnoses    Abscess    -  Primary   Relevant Medications   cephALEXin (KEFLEX) 500 MG capsule   Other Relevant Orders   Wound culture   Screen for STD (sexually transmitted disease)       Relevant Orders   SURESWAB CT/NG/T. vaginalis     Plan: Able to drain most of abscess with slight pressure. Yellow/red drainage present. Keflex 500 mg 4 times a day for 7 days. Apply warm compresses, keep area clean and dry, and avoid rubbing/friction. Wound culture obtained. GC/chlamydia/trich pending. Return to office if symptoms worsen or do not improve.  She is agreeable to plan.     Olivia Mackie [facility], 3:06 PM 12/30/2020

## 2020-12-31 LAB — SURESWAB CT/NG/T. VAGINALIS
C. trachomatis RNA, TMA: NOT DETECTED
N. gonorrhoeae RNA, TMA: NOT DETECTED
Trichomonas vaginalis RNA: NOT DETECTED

## 2020-12-31 LAB — WOUND CULTURE

## 2020-12-31 LAB — TIQ-NTM

## 2021-01-19 ENCOUNTER — Ambulatory Visit (INDEPENDENT_AMBULATORY_CARE_PROVIDER_SITE_OTHER): Payer: Medicare Other | Admitting: Nurse Practitioner

## 2021-01-19 ENCOUNTER — Other Ambulatory Visit: Payer: Self-pay

## 2021-01-19 ENCOUNTER — Encounter: Payer: Self-pay | Admitting: Nurse Practitioner

## 2021-01-19 VITALS — BP 124/80

## 2021-01-19 DIAGNOSIS — N340 Urethral abscess: Secondary | ICD-10-CM

## 2021-01-19 MED ORDER — SULFAMETHOXAZOLE-TRIMETHOPRIM 800-160 MG PO TABS: 1.0000 | ORAL_TABLET | Freq: Two times a day (BID) | ORAL | 0 refills | Status: AC

## 2021-01-19 NOTE — Progress Notes (Signed)
   Acute Office Visit  Subjective:    Patient ID: Kristen Mahoney, female    DOB: 1946/04/16, 75 y.o.   MRN: [id number]   HPI 75 y.o. presents today for perineal abscess. Was seen 12/30/2020 for same complaint where it was manually drained and patient was prescribed Keflex. She completed full course and says it mostly went away but has returned. Today it is sore without redness or drainage.   Review of Systems  Constitutional: Negative.   Skin: Positive for wound (perineum).       Objective:    Physical Exam Constitutional:      Appearance: Normal appearance.  Genitourinary:      BP 124/80 (BP Location: Right Arm, Patient Position: Sitting, Cuff Size: Normal)  Wt Readings from Last 3 Encounters:  04/15/20 129 lb (58.5 kg)  03/10/20 131 lb (59.4 kg)  02/13/20 130 lb (59 kg)        Assessment & Plan:   Problem List Items Addressed This Visit   None   Visit Diagnoses    Abscess of deep perineal space    -  Primary   Relevant Medications   sulfamethoxazole-trimethoprim (BACTRIM DS) 800-160 MG tablet   Other Relevant Orders   Culture, routine-genital     I&D of abscess Area of concern assessed,low, lateral aspect of vulva/perineum,does not appear to be bartholin's in origin. Indurated ~5cm in diameter, 3 cm deep. Firm and tender to the touch. Discussed pros and cons of I&D  Area prepped with betadine. 3cc 1% lidocaine injected at site. Scalpel inserted to puncture wound, ~3-4 mm incision. Drained bloody substance including small clot, saturating about 80% of  2 4x4 gauze. Wound cultured. Wound much softer but deep firm pocket difficult to drain. Advised to keep area covered x 24 hours then start twice daily sitz baths. Keep area clean and dry. Karma Ganja, [facility]  Plan: Incision and drainage performed by Karma Ganja, NP (see above note). Drainage mostly bloody in nature, without odor. Patient tolerated well. Instructions provided on keeping area clean and dry, performing  sitz baths, and taking Tylenol as needed for pain. Clean dressing applied with extras provided for changing at home if needed. Wound culture pending. Bactrim 800-160 mg twice daily for 14 days. If symptoms worsen or do not improve we will schedule her with Dr. Delilah Shan for further evaluation. She is agreeable to plan.      Tamela Gammon [facility], 12:10 PM 01/19/2021

## 2021-01-19 NOTE — Patient Instructions (Signed)
Skin Abscess  A skin abscess is an infected area on or under your skin that contains a collection of pus and other material. An abscess may also be called a furuncle, carbuncle, or boil. An abscess can occur in or on almost any part of your body. Some abscesses break open (rupture) on their own. Most continue to get worse unless they are treated. The infection can spread deeper into the body and eventually into your blood, which can make you feel ill. Treatment usually involves draining the abscess. What are the causes? An abscess occurs when germs, like bacteria, pass through your skin and cause an infection. This may be caused by:  A scrape or cut on your skin.  A puncture wound through your skin, including a needle injection or insect bite.  Blocked oil or sweat glands.  Blocked and infected hair follicles.  A cyst that forms beneath your skin (sebaceous cyst) and becomes infected. What increases the risk? This condition is more likely to develop in people who:  Have a weak body defense system (immune system).  Have diabetes.  Have dry and irritated skin.  Get frequent injections or use illegal IV drugs.  Have a foreign body in a wound, such as a splinter.  Have problems with their lymph system or veins. What are the signs or symptoms? Symptoms of this condition include:  A painful, firm bump under the skin.  A bump with pus at the top. This may break through the skin and drain. Other symptoms include:  Redness surrounding the abscess site.  Warmth.  Swelling of the lymph nodes (glands) near the abscess.  Tenderness.  A sore on the skin. How is this diagnosed? This condition may be diagnosed based on:  A physical exam.  Your medical history.  A sample of pus. This may be used to find out what is causing the infection.  Blood tests.  Imaging tests, such as an ultrasound, CT scan, or MRI. How is this treated? A small abscess that drains on its own may not  need treatment. Treatment for larger abscesses may include:  Moist heat or heat pack applied to the area several times a day.  A procedure to drain the abscess (incision and drainage).  Antibiotic medicines. For a severe abscess, you may first get antibiotics through an IV and then change to antibiotics by mouth. Follow these instructions at home: Medicines  Take over-the-counter and prescription medicines only as told by your health care provider.  If you were prescribed an antibiotic medicine, take it as told by your health care provider. Do not stop taking the antibiotic even if you start to feel better.   Abscess care  If you have an abscess that has not drained, apply heat to the affected area. Use the heat source that your health care provider recommends, such as a moist heat pack or a heating pad. ? Place a towel between your skin and the heat source. ? Leave the heat on for 20-30 minutes. ? Remove the heat if your skin turns bright red. This is especially important if you are unable to feel pain, heat, or cold. You may have a greater risk of getting burned.  Follow instructions from your health care provider about how to take care of your abscess. Make sure you: ? Cover the abscess with a bandage (dressing). ? Change your dressing or gauze as told by your health care provider. ? Wash your hands with soap and water before you change the  dressing or gauze. If soap and water are not available, use hand sanitizer.  Check your abscess every day for signs of a worsening infection. Check for: ? More redness, swelling, or pain. ? More fluid or blood. ? Warmth. ? More pus or a bad smell.   General instructions  To avoid spreading the infection: ? Do not share personal care items, towels, or hot tubs with others. ? Avoid making skin contact with other people.  Keep all follow-up visits as told by your health care provider. This is important. Contact a health care provider if you  have:  More redness, swelling, or pain around your abscess.  More fluid or blood coming from your abscess.  Warm skin around your abscess.  More pus or a bad smell coming from your abscess.  A fever.  Muscle aches.  Chills or a general ill feeling. Get help right away if you:  Have severe pain.  See red streaks on your skin spreading away from the abscess. Summary  A skin abscess is an infected area on or under your skin that contains a collection of pus and other material.  A small abscess that drains on its own may not need treatment.  Treatment for larger abscesses may include having a procedure to drain the abscess and taking an antibiotic. This information is not intended to replace advice given to you by your health care provider. Make sure you discuss any questions you have with your health care provider. Document Revised: 04/05/2019 Document Reviewed: 01/26/2018 Elsevier Patient Education  2021 [employer].

## 2021-01-22 LAB — CULTURE, ROUTINE-GENITAL
MICRO NUMBER:: 11448631
RESULT:: NORMAL
SPECIMEN QUALITY:: ADEQUATE

## 2021-02-04 DIAGNOSIS — R0981 Nasal congestion: Secondary | ICD-10-CM | POA: Diagnosis not present

## 2021-02-04 DIAGNOSIS — R059 Cough, unspecified: Secondary | ICD-10-CM | POA: Diagnosis not present

## 2021-02-04 DIAGNOSIS — Z20822 Contact with and (suspected) exposure to covid-19: Secondary | ICD-10-CM | POA: Diagnosis not present

## 2021-02-05 DIAGNOSIS — R0789 Other chest pain: Secondary | ICD-10-CM | POA: Diagnosis not present

## 2021-02-05 DIAGNOSIS — J309 Allergic rhinitis, unspecified: Secondary | ICD-10-CM | POA: Diagnosis not present

## 2021-02-05 DIAGNOSIS — R002 Palpitations: Secondary | ICD-10-CM | POA: Diagnosis not present

## 2021-02-05 DIAGNOSIS — E119 Type 2 diabetes mellitus without complications: Secondary | ICD-10-CM | POA: Diagnosis not present

## 2021-02-12 DIAGNOSIS — N200 Calculus of kidney: Secondary | ICD-10-CM | POA: Diagnosis not present

## 2021-02-12 DIAGNOSIS — N952 Postmenopausal atrophic vaginitis: Secondary | ICD-10-CM | POA: Diagnosis not present

## 2021-02-12 DIAGNOSIS — R102 Pelvic and perineal pain: Secondary | ICD-10-CM | POA: Diagnosis not present

## 2021-02-12 DIAGNOSIS — N3021 Other chronic cystitis with hematuria: Secondary | ICD-10-CM | POA: Diagnosis not present

## 2021-02-16 ENCOUNTER — Ambulatory Visit: Payer: Medicare Other | Admitting: Adult Health

## 2021-02-24 ENCOUNTER — Ambulatory Visit (INDEPENDENT_AMBULATORY_CARE_PROVIDER_SITE_OTHER): Payer: Medicare Other | Admitting: Adult Health

## 2021-02-24 ENCOUNTER — Encounter: Payer: Self-pay | Admitting: Adult Health

## 2021-02-24 VITALS — BP 160/80 | HR 82 | Ht 62.0 in | Wt 140.0 lb

## 2021-02-24 DIAGNOSIS — Z9989 Dependence on other enabling machines and devices: Secondary | ICD-10-CM | POA: Diagnosis not present

## 2021-02-24 DIAGNOSIS — G44219 Episodic tension-type headache, not intractable: Secondary | ICD-10-CM

## 2021-02-24 DIAGNOSIS — G4733 Obstructive sleep apnea (adult) (pediatric): Secondary | ICD-10-CM | POA: Diagnosis not present

## 2021-02-24 NOTE — Progress Notes (Signed)
PATIENT: Kristen Mahoney DOB: 1946-10-12  REASON FOR VISIT: follow up HISTORY FROM: patient  HISTORY OF PRESENT ILLNESS: Today 02/24/21:  Kristen Mahoney is a 75 year old female with a history of OSA on CPAP. She returns today for follow-up. She has not been using CPAP since she lost weight. States that she has lost >30 lbs. Overall states that she has being doing well. She has noticed mild headache in the occipital region.  States that it usually occurs when she is stressed.  She has no accompanying symptoms.  States that she usually can take Tylenol and it resolves.  Reports that she did get hair tracks in that area unsure if that is what is causing the headaches?  She returns today for an evaluation.  HISTORY I have pleasure of seeing Kristen Mahoney today who has been a CPAP user since 2008 when she was originally diagnosed with obstructive sleep apnea at an AHI of 29.8. She achieved a lot of weight loss and was for this reason placed on an auto titrating. Last year the 95%t percentile pressure was 12.5 cm water and this year it is 10.8 cm water. This correlates with further weight loss, 23 pounds.  A residual AHI is 3.2 but the majority is central in nature, she continues on her AutoSet was 1 cm EPR she has used to machine 28 out of 30 days but only 18 days for longer than 4 hours. Her sleep has been more fragmented since she is depressed and she wakes up in the middle of the night having removed the mask. She has a dry mouth . This was not all intentional weight loss, some of this may be attributed to depression. She felt severely depressed she had finally weaned off alprazolam, and she was started on an SSRI, namely Zoloft. She still has a feeling of in a unrest and jitteriness, anxiety and she is excessively daytime sleepy. All this fits depression. Her symptoms have gotten better since she started Zoloft but they have not been yet controlled. I think it may help if she also exercises daily. She has  rectal-stool incontinence which makes it hard for her to swim. Epworth 6 points, FSS 20 points.   REVIEW OF SYSTEMS: Out of a complete 14 system review of symptoms, the patient complains only of the following symptoms, and all other reviewed systems are negative.  See HPI  ALLERGIES: Allergies  Allergen Reactions  . Ciprofloxacin Rash  . Morphine And Related Itching    HOME MEDICATIONS: Outpatient Medications Prior to Visit  Medication Sig Dispense Refill  . acetaminophen (TYLENOL) 500 MG tablet Take 1,000 mg by mouth every 6 (six) hours as needed (for pain.).    [name] Kitchen aspirin EC 81 MG tablet Take 81 mg by mouth every evening.     [name] Kitchen atorvastatin (LIPITOR) 20 MG tablet Take 20 mg by mouth daily.     [name] Kitchen buPROPion (WELLBUTRIN XL) 150 MG 24 hr tablet Take 150 mg by mouth daily.    . fluticasone (FLONASE) 50 MCG/ACT nasal spray Place 1 spray into both nostrils daily as needed for allergies.     . hydrocortisone 1 % ointment Apply 1 application topically 3 (three) times daily. (Patient taking differently: Apply 1 application topically 3 (three) times daily as needed for itching.) 30 g 0  . hyoscyamine (LEVSIN SL) 0.125 MG SL tablet DISSOLVE 1 TABLET IN MOUTH EVERY 6 HOURS AS NEEDED FOR CRAMPS 270 tablet 0  . loratadine (CLARITIN) 10 MG  tablet Take 10 mg by mouth daily as needed for allergies.    . metFORMIN (GLUCOPHAGE-XR) 500 MG 24 hr tablet Take 500 mg by mouth daily.     . metoprolol succinate (TOPROL XL) 50 MG 24 hr tablet Take 1 tablet (50 mg total) by mouth daily. Take with or immediately following a meal. 30 tablet 0  . Multiple Vitamins-Minerals (ICAPS) CAPS Take 1 capsule by mouth 2 (two) times daily.     . nitrofurantoin (MACRODANTIN) 25 MG capsule Take 25 mg by mouth at bedtime.    [name] Kitchen omeprazole (PRILOSEC) 20 MG capsule Take 1 capsule (20 mg total) by mouth 2 (two) times daily before a meal. 180 capsule 3  . RESTASIS 0.05 % ophthalmic emulsion Place 1 drop into both eyes every 12 (twelve)  hours.    . valACYclovir (VALTREX) 500 MG tablet Take one tablet twice daily for 3-5 days as needed with an outbreak, then daily as needed (Patient taking differently: Take 500 mg by mouth 2 (two) times daily as needed (outbreak).) 30 tablet 12  . Vitamin D, Cholecalciferol, 400 units TABS Take 400 Units by mouth daily.     . vitamin E 180 MG (400 UNITS) capsule Take 400 Units by mouth 2 (two) times daily.      Facility-Administered Medications Prior to Visit  Medication Dose Route Frequency Provider Last Rate Last Admin  . 0.9 %  sodium chloride infusion  500 mL Intravenous Continuous Nandigam, Kavitha V, MD      . betamethasone acetate-betamethasone sodium phosphate (CELESTONE) injection 3 mg  3 mg Intramuscular Once Edrick Kins, DPM        PAST MEDICAL HISTORY: Past Medical History:  Diagnosis Date  . Allergy    seasonal  . Anxiety   . Arthritis    knees,all over  . Basal cell carcinoma   . Benign breast cyst in female    PATIENT HAS HISTORY OF BREAST CYSTS  . Cataract    beginning bilateral  . Diabetes mellitus   . GERD (gastroesophageal reflux disease)   . Herpes progenitalis   . Hyperlipidemia   . Hypertension   . Kidney stone   . OSA on CPAP   . Sleep apnea    cpap  . Urinary, incontinence, stress female   . Vertigo     PAST SURGICAL HISTORY: Past Surgical History:  Procedure Laterality Date  . ABDOMINAL HYSTERECTOMY  1986   leiomyomata  . Basal cell excised    . breast cystectomy     right  . CHOLECYSTECTOMY  2003  . COLONOSCOPY    . ear cystectomy    . ESOPHAGOGASTRODUODENOSCOPY    . KNEE ARTHROSCOPY     right  . LITHOTRIPSY    . RECTOVAGINAL FISTULA CLOSURE      FAMILY HISTORY: Family History  Problem Relation Age of Onset  . Diabetes Mother   . Diabetes Father   . Colon cancer Father   . Heart disease Father   . Hypertension Father   . Diabetes Sister   . Hypertension Sister   . Diabetes Brother   . Colon polyps Brother   . Prostate  cancer Brother   . Breast cancer Paternal Aunt        Age 90's  . Colon polyps Brother   . Heart disease Brother   . Stroke Brother   . Hyperlipidemia Brother   . Colon polyps Sister   . Diabetes Sister   . Hypertension Sister   .  Heart disease Sister   . Esophageal cancer Neg Hx   . Rectal cancer Neg Hx   . Stomach cancer Neg Hx     SOCIAL HISTORY: Social History   Socioeconomic History  . Marital status: Married    Spouse name: Not on file  . Number of children: 2  . Years of education: Not on file  . Highest education level: Not on file  Occupational History  . Occupation: Retired    Fish farm manager: RETIRED  Tobacco Use  . Smoking status: Former Research scientist (life sciences)  . Smokeless tobacco: Never Used  Vaping Use  . Vaping Use: Never used  Substance and Sexual Activity  . Alcohol use: Not Currently  . Drug use: No  . Sexual activity: Not Currently    Birth control/protection: Surgical  Other Topics Concern  . Not on file  Social History Narrative   Denies caffeine use.   Social Determinants of Health   Financial Resource Strain: Not on file  Food Insecurity: Not on file  Transportation Needs: Not on file  Physical Activity: Not on file  Stress: Not on file  Social Connections: Not on file  Intimate Partner Violence: Not on file      PHYSICAL EXAM  Vitals:   02/24/21 1124  BP: (!) 160/80  Pulse: 82  Weight: 140 lb (63.5 kg)  Height: 5\' 2"  (1.575 m)   Body mass index is 25.61 kg/m. Generalized: Well developed, in no acute distress  Chest: Lungs clear to auscultation bilaterally  Neurological examination  Mentation: Alert oriented to time, place, history taking. Follows all commands speech and language fluent Cranial nerve II-XII: Extraocular movements were full, visual field were full on confrontational test Head turning and shoulder shrug  were normal and symmetric. Motor: The motor testing reveals 5 over 5 strength of all 4 extremities. Good symmetric motor tone  is noted throughout.  Sensory: Sensory testing is intact to soft touch on all 4 extremities. No evidence of extinction is noted.  Gait and station: Gait is normal.   DIAGNOSTIC DATA (LABS, IMAGING, TESTING) - I reviewed patient records, labs, notes, testing and imaging myself where available.  Lab Results  Component Value Date   WBC 6.1 04/15/2020   HGB 13.0 04/15/2020   HCT 39.4 04/15/2020   MCV 90.9 04/15/2020   PLT 192.0 04/15/2020      Component Value Date/Time   NA 145 04/15/2020 1059   K 4.4 04/15/2020 1059   CL 107 04/15/2020 1059   CO2 31 04/15/2020 1059   GLUCOSE 109 (H) 04/15/2020 1059   BUN 16 04/15/2020 1059   CREATININE 0.81 04/15/2020 1059   CALCIUM 9.3 04/15/2020 1059   PROT 6.8 04/15/2020 1059   ALBUMIN 4.2 04/15/2020 1059   AST 12 04/15/2020 1059   ALT 8 04/15/2020 1059   ALKPHOS 54 04/15/2020 1059   BILITOT 0.4 04/15/2020 1059   GFRNONAA 27 (L) 09/21/2019 1615   GFRAA 31 (L) 09/21/2019 1615   No results found for: CHOL, HDL, LDLCALC, LDLDIRECT, TRIG, CHOLHDL Lab Results  Component Value Date   HGBA1C 6.2 (H) 09/20/2019   No results found for: VITAMINB12 No results found for: TSH    ASSESSMENT AND PLAN 75 y.o. year old female  has a past medical history of Allergy, Anxiety, Arthritis, Basal cell carcinoma, Benign breast cyst in female, Cataract, Diabetes mellitus, GERD (gastroesophageal reflux disease), Herpes progenitalis, Hyperlipidemia, Hypertension, Kidney stone, OSA on CPAP, Sleep apnea, Urinary, incontinence, stress female, and Vertigo. here with :  Obstructive  sleep apnea  -Currently not using CPAP due to weight loss -We will complete home sleep test to confirm if she still has apnea -Advised that she will follow-up pending results from sleep test  Occiptal headaches/ muscle tension headaches  -Could be due to muscle tension?  Currently the headaches are not consistent and respond to Tylenol.  Advised if headaches worsen or she develops  new symptoms she should let us know.   I spent 25 minutes of face-to-face and non-face-to-face time with patient.  This included previsit chart review, lab review, study review, order entry, electronic health record documentation, patient education.  Ward Givens, MSN, NP-C 02/24/2021, 11:28 AM [facility] [address], [place], [place] [address] [phone]

## 2021-02-24 NOTE — Patient Instructions (Signed)
Your Plan:  Home sleep test ordered If your symptoms worsen or you develop new symptoms please let us know.    Thank you for coming to see [place] at [facility]. I hope we have been able to provide you high quality care today.  You may receive a patient satisfaction survey over the next few weeks. We would appreciate your feedback and comments so that we may continue to improve ourselves and the health of our patients.

## 2021-02-25 ENCOUNTER — Telehealth: Payer: Self-pay

## 2021-02-25 NOTE — Telephone Encounter (Signed)
Pt is not ready to schedule at this time. Pt is helping with her brother(who is to have major surgery soon). Pt asked to call back at end of the month when she is ready.

## 2021-03-11 ENCOUNTER — Other Ambulatory Visit: Payer: Self-pay

## 2021-03-11 ENCOUNTER — Ambulatory Visit (INDEPENDENT_AMBULATORY_CARE_PROVIDER_SITE_OTHER): Payer: Medicare Other | Admitting: Nurse Practitioner

## 2021-03-11 ENCOUNTER — Encounter: Payer: Self-pay | Admitting: Nurse Practitioner

## 2021-03-11 VITALS — BP 126/78 | HR 84 | Resp 14 | Ht 61.5 in | Wt 134.0 lb

## 2021-03-11 DIAGNOSIS — Z01419 Encounter for gynecological examination (general) (routine) without abnormal findings: Secondary | ICD-10-CM

## 2021-03-11 DIAGNOSIS — R350 Frequency of micturition: Secondary | ICD-10-CM

## 2021-03-11 DIAGNOSIS — Z90710 Acquired absence of both cervix and uterus: Secondary | ICD-10-CM

## 2021-03-11 NOTE — Progress Notes (Signed)
   Kristen Mahoney 1946-12-11 [id number]   History:  75 y.o. [id number] presents for breast and pelvic exam. No GYN complaints. 1986 TVH for fibroids, on no HRT. Normal pap and mammogram history. She is having urinary frequency with dysuria but this is not new for her.   Gynecologic History No LMP recorded. Patient has had a hysterectomy.   Contraception/Family planning: status post hysterectomy  Health Maintenance Last Pap: No longer screening per guidelines Last mammogram: 06/2020. Results were: normal Last colonoscopy: 2018. Results were: polyp, 5-year repeat Last Dexa: 03/2020. Results were: normal, 5-year follow up recommended  Past medical history, past surgical history, family history and social history were all reviewed and documented in the EPIC chart.  ROS:  A ROS was performed and pertinent positives and negatives are included.  Exam:  Vitals:   03/11/21 1440  BP: 126/78  Pulse: 84  Resp: 14  Weight: 134 lb (60.8 kg)  Height: 5' 1.5" (1.562 m)   Body mass index is 24.91 kg/m.  General appearance:  Normal Thyroid:  Symmetrical, normal in size, without palpable masses or nodularity. Respiratory  Auscultation:  Clear without wheezing or rhonchi Cardiovascular  Auscultation:  Regular rate, without rubs, murmurs or gallops  Edema/varicosities:  Not grossly evident Abdominal  Soft,nontender, without masses, guarding or rebound.  Liver/spleen:  No organomegaly noted  Hernia:  None appreciated  Skin  Inspection:  Grossly normal   Breasts: Examined lying and sitting.   Right: Without masses, retractions, discharge or axillary adenopathy.   Left: Without masses, retractions, discharge or axillary adenopathy. Gentitourinary   Inguinal/mons:  Normal without inguinal adenopathy  External genitalia:  Normal  BUS/Urethra/Skene's glands:  Normal  Vagina:  Atrophic changes  Cervix:  Absent  Uterus:  Absent  Adnexa/parametria:     Rt: Without masses or  tenderness.   Lt: Without masses or tenderness.  Anus and perineum: Normal  Digital rectal exam: Normal sphincter tone without palpated masses or tenderness  UA negative  Assessment/Plan:  75 y.o. [id number] for annual exam.   Well female exam with routine gynecological exam - Education provided on SBEs, importance of preventative screenings, current guidelines, high calcium diet, regular exercise, and multivitamin daily. Labs with PCP.   Frequent urination - Plan: Urinalysis,Complete w/RFL Culture. Not new for her. UA negative.   History of total vaginal hysterectomy (TVH) - For fibroids/menorrhagia, no HRT.  Screening for cervical cancer - Normal Pap history.  No longer screening per guidelines.  Screening for breast cancer - Normal mammogram history.  Continue annual screenings.  Normal breast exam today.  Screening for colon cancer - 2018 colonoscopy. Will repeat at GI's recommended interval.   Return in 2 years for breast and pelvic exam.     Tamela Gammon DNP, 2:50 PM 03/11/2021

## 2021-03-11 NOTE — Patient Instructions (Signed)
Health Maintenance After Age 76 After age 76, you are at a higher risk for certain long-term diseases and infections as well as injuries from falls. Falls are a major cause of broken bones and head injuries in people who are older than age 76. Getting regular preventive care can help to keep you healthy and well. Preventive care includes getting regular testing and making lifestyle changes as recommended by your health care provider. Talk with your health care provider about:  Which screenings and tests you should have. A screening is a test that checks for a disease when you have no symptoms.  A diet and exercise plan that is right for you. What should I know about screenings and tests to prevent falls? Screening and testing are the best ways to find a health problem early. Early diagnosis and treatment give you the best chance of managing medical conditions that are common after age 76. Certain conditions and lifestyle choices may make you more likely to have a fall. Your health care provider may recommend:  Regular vision checks. Poor vision and conditions such as cataracts can make you more likely to have a fall. If you wear glasses, make sure to get your prescription updated if your vision changes.  Medicine review. Work with your health care provider to regularly review all of the medicines you are taking, including over-the-counter medicines. Ask your health care provider about any side effects that may make you more likely to have a fall. Tell your health care provider if any medicines that you take make you feel dizzy or sleepy.  Osteoporosis screening. Osteoporosis is a condition that causes the bones to get weaker. This can make the bones weak and cause them to break more easily.  Blood pressure screening. Blood pressure changes and medicines to control blood pressure can make you feel dizzy.  Strength and balance checks. Your health care provider may recommend certain tests to check your  strength and balance while standing, walking, or changing positions.  Foot health exam. Foot pain and numbness, as well as not wearing proper footwear, can make you more likely to have a fall.  Depression screening. You may be more likely to have a fall if you have a fear of falling, feel emotionally low, or feel unable to do activities that you used to do.  Alcohol use screening. Using too much alcohol can affect your balance and may make you more likely to have a fall. What actions can I take to lower my risk of falls? General instructions  Talk with your health care provider about your risks for falling. Tell your health care provider if: ? You fall. Be sure to tell your health care provider about all falls, even ones that seem minor. ? You feel dizzy, sleepy, or off-balance.  Take over-the-counter and prescription medicines only as told by your health care provider. These include any supplements.  Eat a healthy diet and maintain a healthy weight. A healthy diet includes low-fat dairy products, low-fat (lean) meats, and fiber from whole grains, beans, and lots of fruits and vegetables. Home safety  Remove any tripping hazards, such as rugs, cords, and clutter.  Install safety equipment such as grab bars in bathrooms and safety rails on stairs.  Keep rooms and walkways well-lit. Activity  Follow a regular exercise program to stay fit. This will help you maintain your balance. Ask your health care provider what types of exercise are appropriate for you.  If you need a cane or walker,   use it as recommended by your health care provider.  Wear supportive shoes that have nonskid soles.   Lifestyle  Do not drink alcohol if your health care provider tells you not to drink.  If you drink alcohol, limit how much you have: ? 0-1 drink a day for women. ? 0-2 drinks a day for men.  Be aware of how much alcohol is in your drink. In the [place] one drink equals one typical bottle of beer (12  oz), one-half glass of wine (5 oz), or one shot of hard liquor (1 oz).  Do not use any products that contain nicotine or tobacco, such as cigarettes and e-cigarettes. If you need help quitting, ask your health care provider. Summary  Having a healthy lifestyle and getting preventive care can help to protect your health and wellness after age 76.  Screening and testing are the best way to find a health problem early and help you avoid having a fall. Early diagnosis and treatment give you the best chance for managing medical conditions that are more common for people who are older than age 76.  Falls are a major cause of broken bones and head injuries in people who are older than age 76. Take precautions to prevent a fall at home.  Work with your health care provider to learn what changes you can make to improve your health and wellness and to prevent falls. This information is not intended to replace advice given to you by your health care provider. Make sure you discuss any questions you have with your health care provider. Document Revised: 04/05/2019 Document Reviewed: 10/26/2017 Elsevier Patient Education  2021 Elsevier Inc.  

## 2021-03-12 LAB — URINALYSIS, COMPLETE W/RFL CULTURE
Bacteria, UA: NONE SEEN /HPF
Bilirubin Urine: NEGATIVE
Glucose, UA: NEGATIVE
Hgb urine dipstick: NEGATIVE
Ketones, ur: NEGATIVE
Leukocyte Esterase: NEGATIVE
Nitrites, Initial: NEGATIVE
RBC / HPF: NONE SEEN /HPF (ref 0–2)
Specific Gravity, Urine: 1.033 (ref 1.001–1.03)
pH: 5 (ref 5.0–8.0)

## 2021-03-18 ENCOUNTER — Encounter: Payer: Self-pay | Admitting: Nurse Practitioner

## 2021-03-23 ENCOUNTER — Ambulatory Visit (INDEPENDENT_AMBULATORY_CARE_PROVIDER_SITE_OTHER): Payer: Medicare Other | Admitting: Neurology

## 2021-03-23 DIAGNOSIS — K219 Gastro-esophageal reflux disease without esophagitis: Secondary | ICD-10-CM

## 2021-03-23 DIAGNOSIS — Z9989 Dependence on other enabling machines and devices: Secondary | ICD-10-CM

## 2021-03-23 DIAGNOSIS — G4733 Obstructive sleep apnea (adult) (pediatric): Secondary | ICD-10-CM

## 2021-03-23 DIAGNOSIS — Z91199 Patient's noncompliance with other medical treatment and regimen due to unspecified reason: Secondary | ICD-10-CM

## 2021-03-23 DIAGNOSIS — Z9114 Patient's other noncompliance with medication regimen: Secondary | ICD-10-CM

## 2021-03-23 DIAGNOSIS — G4750 Parasomnia, unspecified: Secondary | ICD-10-CM

## 2021-03-23 DIAGNOSIS — Z87898 Personal history of other specified conditions: Secondary | ICD-10-CM

## 2021-03-26 NOTE — Progress Notes (Signed)
   [facility] TEST (HST Watch PAT)  STUDY DATA LOAD: 03-26-21  DOB: July 05, 1946  MRN: [id number]  ORDERING CLINICIAN: Ernestine Mcmurray, NP   REFERRING CLINICIAN: Larey Seat, MD   CLINICAL INFORMATION/HISTORY: 02-24-21. Mrs. Donnalynn Wheeless is a 75 year old female with a history of OSA on CPAP since diagnosed in 2008, baseline AHI was 29.8/h she uses CPAP at 12.5 cm water , 1 cm EPR and had poorer compliance over the last 12 month. She had insomnia which improved on ZOLOFT. She has not been using CPAP since she lost weight >30 lbs.She has noticed mild headache in the occipital region. History of rectal incontinence, which is causing her to socially isolate. DM, GERD, and Vertigo listed.     Epworth sleepiness score: 5/24.  BMI: 24.3 kg/m  Neck Circumference: N/A  FINDINGS:   Total Record Time (hours, min): 9 h 47 min  Total Sleep Time (hours, min):  8 h 25 min   Percent REM (%):    25.64 %   Calculated pAHI (per hour):  37.7       REM pAHI: 40.1   NREM pAHI: 37.1 Supine AHI: 42.9   Oxygen Saturation (%) Mean: 94  Minimum oxygen saturation (%):        86   O2 Saturation Range (%): 86-99  O2Saturation (minutes) <=88%: 1.1 min  Pulse Mean (bpm):    72  Pulse Range (51-96)   IMPRESSION: This HST confirmed that OSA (obstructive sleep apnea) is still present and SEVERE.  Minimal accentuation  by REM sleep or sleep position. No prolonged hypoxemia.     RECOMMENDATION:  I strongly recommend to return to CPAP, or change to [facility] implant device. This apnea is severe in a slender patient without central apnea, REM dependent apnea or hypoxemia. I recommend to resume the CPAP autotitration device  5-16 cm water 2 cm EPR, heated humidification and mask of her choice.    INTERPRETING PHYSICIAN:  Larey Seat, MD   [facility] and [facility] Sleep Board certified by The [employer] of Sleep Medicine and Diplomate of the [employer]  of Sleep Medicine. Board certified In Neurology through the [place], Fellow of the [employer] of Neurology. Medical Director of [employer].

## 2021-04-03 ENCOUNTER — Telehealth: Payer: Self-pay | Admitting: Neurology

## 2021-04-03 DIAGNOSIS — G44219 Episodic tension-type headache, not intractable: Secondary | ICD-10-CM

## 2021-04-03 DIAGNOSIS — G4733 Obstructive sleep apnea (adult) (pediatric): Secondary | ICD-10-CM

## 2021-04-03 DIAGNOSIS — Z87898 Personal history of other specified conditions: Secondary | ICD-10-CM | POA: Insufficient documentation

## 2021-04-03 DIAGNOSIS — K219 Gastro-esophageal reflux disease without esophagitis: Secondary | ICD-10-CM | POA: Insufficient documentation

## 2021-04-03 DIAGNOSIS — Z9114 Patient's other noncompliance with medication regimen: Secondary | ICD-10-CM | POA: Insufficient documentation

## 2021-04-03 NOTE — Telephone Encounter (Signed)
[facility] TEST (HST Watch PAT)  STUDY DATA LOAD: 03-26-21  DOB: Jan 02, 1946  MRN: [id number]  ORDERING CLINICIAN: Ernestine Mcmurray, NP   REFERRING CLINICIAN: Larey Seat, MD   CLINICAL INFORMATION/HISTORY: 02-24-21. Mrs. Kristen Mahoney is a 75 year old female with a history of OSA on CPAP since diagnosed in 2008, baseline AHI was 29.8/h she uses CPAP at 12.5 cm water , 1 cm EPR and had poorer compliance over the last 12 month. She had insomnia which improved on ZOLOFT. She has not been using CPAP since she lost weight >30 lbs.She has noticed mild headache in the occipital region. History of rectal incontinence, which is causing her to socially isolate. DM, GERD, and Vertigo listed.     Epworth sleepiness score: 5/24.  BMI: 24.3 kg/m  Neck Circumference: N/A  FINDINGS:   Total Record Time (hours, min): 9 h 47 min  Total Sleep Time (hours, min):  8 h 25 min   Percent REM (%):    25.64 %   Calculated pAHI (per hour):  37.7       REM pAHI: 40.1   NREM pAHI: 37.1 Supine AHI: 42.9   Oxygen Saturation (%) Mean: 94  Minimum oxygen saturation (%):        86   O2 Saturation Range (%): 86-99  O2Saturation (minutes) <=88%: 1.1 min  Pulse Mean (bpm):    72  Pulse Range (51-96)   IMPRESSION: This HST confirmed that OSA (obstructive sleep apnea) is still present and SEVERE.  Minimal accentuation  by REM sleep or sleep position. No prolonged hypoxemia.     RECOMMENDATION:  I strongly recommend to return to CPAP, or change to [facility] implant device. This apnea is severe in a slender patient without central apnea, REM dependent apnea or hypoxemia. I recommend to resume the CPAP autotitration device  5-16 cm water 2 cm EPR, heated humidification and mask of her choice.    INTERPRETING PHYSICIAN:  Larey Seat, MD   [facility] and [facility] Sleep Board certified by The [employer] of Sleep Medicine and Diplomate of the [employer] of  Sleep Medicine. Board certified In Neurology through the [place], Fellow of the [employer] of Neurology. Medical Director of [employer].

## 2021-04-03 NOTE — Procedures (Signed)
[facility] [place] TEST (HST Watch PAT)  STUDY DATA LOAD: 03-26-21  DOB: [birth year]  MRN: [id number]  ORDERING CLINICIAN: Ernestine Mcmurray, NP   REFERRING CLINICIAN: Larey Seat, MD   CLINICAL INFORMATION/HISTORY: 02-24-21. Kristen Mahoney is a 75 year old female with a history of OSA on CPAP since diagnosed in 2008, baseline AHI was 29.8/h she uses CPAP at 12.5 cm water , 1 cm EPR and had poorer compliance over the last 12 month. She had insomnia which improved on ZOLOFT. She has not been using CPAP since she lost weight >30 lbs.She has noticed mild headache in the occipital region. History of rectal incontinence, which is causing her to socially isolate. DM, GERD, and Vertigo listed.     Epworth sleepiness score: 5/24.  BMI: 24.3 kg/m  Neck Circumference: N/A  FINDINGS:   Total Record Time (hours, min): 9 h 47 min  Total Sleep Time (hours, min):  8 h 25 min   Percent REM (%):    25.64 %   Calculated pAHI (per hour):  37.7       REM pAHI: 40.1   NREM pAHI: 37.1 Supine AHI: 42.9   Oxygen Saturation (%) Mean: 94  Minimum oxygen saturation (%):        86   O2 Saturation Range (%): 86-99  O2Saturation (minutes) <=88%: 1.1 min  Pulse Mean (bpm):    72  Pulse Range (51-96)   IMPRESSION: This HST confirmed that OSA (obstructive sleep apnea) is still present and SEVERE.  Minimal accentuation  by REM sleep or sleep position. No prolonged hypoxemia.     RECOMMENDATION:  I strongly recommend to return to CPAP, or change to [facility] implant device. This apnea is severe in a slender patient without central apnea, REM dependent apnea or hypoxemia. I recommend to resume the CPAP autotitration device  5-16 cm water 2 cm EPR, heated humidification and mask of her choice.    INTERPRETING PHYSICIAN:  Larey Seat, MD   [facility] and [facility] Sleep Board certified by The [employer] of Sleep Medicine and Diplomate of the [employer] of  Sleep Medicine. Board certified In Neurology through the [place], Fellow of the [employer] of Neurology. Medical Director of [employer].

## 2021-04-07 NOTE — Telephone Encounter (Signed)
Called to review her sleep study results.  Advised the home sleep study still indicated her apnea to be in the moderate to severe range.  Informed her  results.  Advised that Dr. Brett Fairy recommends the patient restart CPAP.  Patient still has her machine that was set up in 2020.  She was set up to [facility].  It is a auto CPAP capable machine.  Advised the setting would be changed to 5-16 cm water pressure.  Inform the patient I will forward the orders to [facility] and that once she gets home from her vacation she should start using the machine.  Advised she she is machine at least a little over 4 hours each night but of course our goal would be all night.  Advised her insurance should cover the cost of her supplies once she has become compliant using the machine.  Advised the patient we should schedule a follow-up visit within 3 months to make sure everything is working well.  Patient agreed.  Patient was set up for an appointment July 11 at 3:30 PM with Dr. Brett Fairy.  Informed the patient a letter would be mailed informing of this information and she asked for a copy of her sleep study to be mailed as well.

## 2021-04-20 DIAGNOSIS — G4733 Obstructive sleep apnea (adult) (pediatric): Secondary | ICD-10-CM | POA: Diagnosis not present

## 2021-04-23 ENCOUNTER — Telehealth: Payer: Self-pay | Admitting: Adult Health

## 2021-04-23 NOTE — Telephone Encounter (Signed)
Pt is calling to report that she is still waiting on the pressure change on her CPAP and the supplies needed, please call

## 2021-04-23 NOTE — Telephone Encounter (Signed)
Sent CM for pts cpap. To [facility].

## 2021-04-27 NOTE — Telephone Encounter (Signed)
Jacquelyne Balint, RN Will process,   Janett Billow      Previous Messages   ----- Message -----  From: Brandon Melnick, RN  Sent: 04/23/2021  4:56 PM EDT  To: Vanessa Ralphs, Charmian Muff  Subject: cpap                       Pt is calling to report that she is still waiting on the pressure change on her CPAP and the supplies needed, please call. Order in [place] for pt per Dr. Brett Fairy. Preston Fleeting   Female, 75 y.o., 1946-04-05   MRN:  [id number]  Thanks, Lovey Newcomer RN [facility]

## 2021-04-27 NOTE — Telephone Encounter (Signed)
LMVM for pt that did send message to DME to contact about pressure change.  They noted will process.

## 2021-05-01 NOTE — Telephone Encounter (Signed)
Pt has called stating it has been about 3 weeks since she has been waiting on her pressure change.  Pt is asking the order be sent again but this time please fax it to [phone]

## 2021-05-04 NOTE — Telephone Encounter (Signed)
I called Glenda at [facility] ([email or web address]) she could not see orders with pressures.  Last supplies order from pt 04-20-21.  I relayed that it is in the orders Epic via Comments per below.    IMPRESSION: This HST confirmed that OSA (obstructive sleep apnea) is still present and SEVERE.  Minimal accentuation by REM sleep or sleep position. No prolonged hypoxemia.  RECOMMENDATION:  I strongly recommend to return to CPAP, or change to [facility] implant device.  This apnea is severe in a slender patient without central apnea, REM dependent apnea or hypoxemia.  I recommend to resume the CPAP autotitration device 5-16 cm water 2 cm EPR, heated humidification and mask of her choice.  INTERPRETING PHYSICIAN:  Larey Seat MD  I emailed  to [email or web address].  Once received she stated would update and make pressure changes and then call pt.

## 2021-05-04 NOTE — Telephone Encounter (Signed)
Pt called, CPAP company waiting on pressure reading from my CPAP machine. Would like a call from the nurse.

## 2021-05-04 NOTE — Telephone Encounter (Signed)
LMVM for pt to let her know that poke to glenda at [facility].  Emailed order  to her at [facility]. To take care of for her.

## 2021-05-05 NOTE — Telephone Encounter (Signed)
I spoke to [facility] with [facility] compliance after call transferred.  She stated that she wanted to have order for pressure changes faxed to [phone]. I faxed to # given.  Fax confirmation received.

## 2021-05-07 ENCOUNTER — Other Ambulatory Visit: Payer: Self-pay

## 2021-05-07 MED ORDER — HYOSCYAMINE SULFATE 0.125 MG SL SUBL: SUBLINGUAL_TABLET | SUBLINGUAL | 0 refills | Status: DC

## 2021-05-11 ENCOUNTER — Telehealth: Payer: Self-pay | Admitting: Adult Health

## 2021-05-11 NOTE — Telephone Encounter (Signed)
Pt called, spoke with [facility], they said have not received anything to change pressure for CPAP machine. Would like a call from the nurse

## 2021-05-11 NOTE — Telephone Encounter (Signed)
I called and LMVM for to return call.  Her pressure is 5-16cm.  From 04-03-2021.  There has been no change from that last date.

## 2021-05-11 NOTE — Telephone Encounter (Signed)
Pt has called to inform that she is being told that [place] has not received anything from the office re: the need to adjust pressure.  Pt is now asking the prescription to adjust the pressure can be faxed to [phone].  If that does not work pt have been given the contact person of Yvetta Coder, she can be called at [phone] xt [phone]

## 2021-05-12 NOTE — Telephone Encounter (Signed)
Error

## 2021-05-13 NOTE — Telephone Encounter (Signed)
I called pt and LMVM for her to call me back again. Mobile and home.  I do not have any type of cpap pressure change to relay to [phone].  LMVM to be called back.

## 2021-05-13 NOTE — Telephone Encounter (Signed)
[facility] called stating that they have not received the prescription for the pressure change. They would like it refaxed to [phone] or emailed to [email or web address]

## 2021-05-29 DIAGNOSIS — I10 Essential (primary) hypertension: Secondary | ICD-10-CM | POA: Diagnosis not present

## 2021-05-29 DIAGNOSIS — E785 Hyperlipidemia, unspecified: Secondary | ICD-10-CM | POA: Diagnosis not present

## 2021-05-29 DIAGNOSIS — E119 Type 2 diabetes mellitus without complications: Secondary | ICD-10-CM | POA: Diagnosis not present

## 2021-05-29 DIAGNOSIS — J309 Allergic rhinitis, unspecified: Secondary | ICD-10-CM | POA: Diagnosis not present

## 2021-06-30 ENCOUNTER — Ambulatory Visit (INDEPENDENT_AMBULATORY_CARE_PROVIDER_SITE_OTHER): Payer: Medicare Other | Admitting: Nurse Practitioner

## 2021-06-30 ENCOUNTER — Other Ambulatory Visit: Payer: Self-pay

## 2021-06-30 ENCOUNTER — Encounter: Payer: Self-pay | Admitting: Nurse Practitioner

## 2021-06-30 VITALS — BP 122/78

## 2021-06-30 DIAGNOSIS — N3001 Acute cystitis with hematuria: Secondary | ICD-10-CM | POA: Diagnosis not present

## 2021-06-30 DIAGNOSIS — R300 Dysuria: Secondary | ICD-10-CM | POA: Diagnosis not present

## 2021-06-30 MED ORDER — SULFAMETHOXAZOLE-TRIMETHOPRIM 800-160 MG PO TABS: 1.0000 | ORAL_TABLET | Freq: Two times a day (BID) | ORAL | 0 refills | Status: DC

## 2021-06-30 NOTE — Progress Notes (Signed)
   Acute Office Visit  Subjective:    Patient ID: Kristen Mahoney, female    DOB: 26-Feb-1946, 75 y.o.   MRN: [id number]   HPI 75 y.o. presents today for frequent urination, pelvic pressure, and back pain that started yesterday.    Review of Systems  Constitutional: Negative.   Genitourinary:  Positive for frequency and pelvic pain (pressure). Negative for dysuria and urgency.  Musculoskeletal:  Positive for back pain.      Objective:    Physical Exam Constitutional:      Appearance: Normal appearance.  Abdominal:     Tenderness: There is no right CVA tenderness or left CVA tenderness.    BP 122/78  Wt Readings from Last 3 Encounters:  03/11/21 134 lb (60.8 kg)  02/24/21 140 lb (63.5 kg)  04/15/20 129 lb (58.5 kg)   UA: trace leukocytes, trace protein, 1+ blood, SG 1.030, clear yellow. Microscopic: wbc 6-10, rbc 3-10, moderate bacteria     Assessment & Plan:   Problem List Items Addressed This Visit   None Visit Diagnoses     Acute cystitis with hematuria    -  Primary   Relevant Medications   sulfamethoxazole-trimethoprim (BACTRIM DS) 800-160 MG tablet   Dysuria       Relevant Orders   Urinalysis,Complete w/RFL Culture      Plan: Urinalysis showed moderate bacteria and mild pyuria. With presence of symptoms it appears to be suggestive of UTI - Bactrim 800-160 mg twice daily x 3 days. Culture pending and will triage based on results.     Tamela Gammon DNP, 4:20 PM 06/30/2021

## 2021-07-02 LAB — URINALYSIS, COMPLETE W/RFL CULTURE
Bilirubin Urine: NEGATIVE
Glucose, UA: NEGATIVE
Hyaline Cast: NONE SEEN /LPF
Nitrites, Initial: NEGATIVE
Specific Gravity, Urine: 1.03 (ref 1.001–1.035)
pH: 5.5 (ref 5.0–8.0)

## 2021-07-02 LAB — CULTURE INDICATED

## 2021-07-02 LAB — URINE CULTURE
MICRO NUMBER:: 12081547
SPECIMEN QUALITY:: ADEQUATE

## 2021-07-06 ENCOUNTER — Ambulatory Visit: Payer: Self-pay | Admitting: Neurology

## 2021-07-13 ENCOUNTER — Encounter: Payer: Self-pay | Admitting: Nurse Practitioner

## 2021-07-13 ENCOUNTER — Other Ambulatory Visit: Payer: Self-pay

## 2021-07-13 ENCOUNTER — Ambulatory Visit (INDEPENDENT_AMBULATORY_CARE_PROVIDER_SITE_OTHER): Payer: Medicare Other | Admitting: Nurse Practitioner

## 2021-07-13 VITALS — BP 118/78

## 2021-07-13 DIAGNOSIS — A5901 Trichomonal vulvovaginitis: Secondary | ICD-10-CM

## 2021-07-13 DIAGNOSIS — N898 Other specified noninflammatory disorders of vagina: Secondary | ICD-10-CM | POA: Diagnosis not present

## 2021-07-13 DIAGNOSIS — R102 Pelvic and perineal pain: Secondary | ICD-10-CM

## 2021-07-13 LAB — WET PREP FOR TRICH, YEAST, CLUE

## 2021-07-13 MED ORDER — METRONIDAZOLE 500 MG PO TABS: 500.0000 mg | ORAL_TABLET | Freq: Two times a day (BID) | ORAL | 0 refills | Status: DC

## 2021-07-13 NOTE — Progress Notes (Signed)
   Acute Office Visit  Subjective:    Patient ID: Kristen Mahoney, female    DOB: 20-Nov-1946, 75 y.o.   MRN: [id number]   HPI 75 y.o. presents today for vaginal discharge and irritation, lower abdominal pressure, and urinary urgency that started 2 days ago. Sexually active with husband who has history of infidelity.    Review of Systems  Constitutional: Negative.   Genitourinary:  Positive for frequency, pelvic pain (pressure), urgency, vaginal discharge and vaginal pain (irritation). Negative for dysuria, flank pain and hematuria.              Objective:    Physical Exam Constitutional:      Appearance: Normal appearance.  Abdominal:     Tenderness: There is no right CVA tenderness or left CVA tenderness.  Genitourinary:    General: Normal vulva.     Vagina: Vaginal discharge and erythema present.    BP 118/78  Wt Readings from Last 3 Encounters:  03/11/21 134 lb (60.8 kg)  02/24/21 140 lb (63.5 kg)  04/15/20 129 lb (58.5 kg)   Wet prep + Trichomonas UTI 3+ leukocytosis, nitrate negative, cloudy/yellow, SG 1.020. Microscopic: wbc 40-60, rbc 0-2, moderate trichomonas, many bacteria     Assessment & Plan:   Problem List Items Addressed This Visit       Genitourinary   Trichomonas vaginitis - Primary   Relevant Medications   metroNIDAZOLE (FLAGYL) 500 MG tablet   Other Visit Diagnoses     Vaginal discharge       Relevant Orders   WET PREP FOR [place], YEAST, CLUE   Pelvic pressure in female       Relevant Orders   Urinalysis,Complete w/RFL Culture      Plan: Wet prep positive for trichomonas - Flagyl 500 mg twice a day for 7 days.  No intercourse for 7 days after treatment is completed, partner needs to be treated, and recommend returning in 3 months for test of cure.  Leukocytosis seen in urinalysis likely due to vaginal infection. Urine culture pending, will triage based on results.  She is agreeable to plan.     Tamela Gammon DNP, 10:19 AM  07/13/2021

## 2021-07-15 LAB — URINALYSIS, COMPLETE W/RFL CULTURE
Bilirubin Urine: NEGATIVE
Glucose, UA: NEGATIVE
Hyaline Cast: NONE SEEN /LPF
Ketones, ur: NEGATIVE
Nitrites, Initial: NEGATIVE
Specific Gravity, Urine: 1.02 (ref 1.001–1.035)
pH: 7 (ref 5.0–8.0)

## 2021-07-15 LAB — URINE CULTURE
MICRO NUMBER:: 12130566
SPECIMEN QUALITY:: ADEQUATE

## 2021-07-15 LAB — CULTURE INDICATED

## 2021-07-20 DIAGNOSIS — G4733 Obstructive sleep apnea (adult) (pediatric): Secondary | ICD-10-CM | POA: Diagnosis not present

## 2021-07-22 ENCOUNTER — Telehealth: Payer: Self-pay | Admitting: Gastroenterology

## 2021-07-22 NOTE — Telephone Encounter (Signed)
Called the patient back. No answer. Left her a message of my call on her voicemail.

## 2021-07-22 NOTE — Telephone Encounter (Signed)
Spoke with the patient. She has a day of having to strain to move her bowels. It has left her feeling sore. She is not constipated now and states her stool is soft. No abdominal pain or rectal bleeding. Discussed how to use Vaseline and gently replace prolapsed hemorrhoid. Also advise moistened wipes and avoid vigorous wiping to clean.  Instructed to call back if she fails to improve or she acutely worsens.

## 2021-07-22 NOTE — Telephone Encounter (Signed)
Patient returned call

## 2021-07-30 DIAGNOSIS — Z03818 Encounter for observation for suspected exposure to other biological agents ruled out: Secondary | ICD-10-CM | POA: Diagnosis not present

## 2021-07-30 DIAGNOSIS — Z20822 Contact with and (suspected) exposure to covid-19: Secondary | ICD-10-CM | POA: Diagnosis not present

## 2021-07-31 ENCOUNTER — Telehealth: Payer: Self-pay | Admitting: *Deleted

## 2021-07-31 DIAGNOSIS — K5900 Constipation, unspecified: Secondary | ICD-10-CM | POA: Diagnosis not present

## 2021-07-31 NOTE — Telephone Encounter (Addendum)
Patient calls me with follow up from our previous conversation.  She tells me she has not had a good bowel movement in 2 days. She is moving minimal stool. Hurts to bear down. Pain in the vaginal area to the rectum that burns. Treated 2 weeks ago with Flagyl for a bacterial vaginal infection. Instructed on Miralax purge. Advised to go to her PCP or the urgent care clinic today for evaluation. Patient states she is going to reach out to her GYN first.

## 2021-07-31 NOTE — Telephone Encounter (Signed)
Patient call back. States she is not getting any better. Pain and burning feeling is from her rectal to the vagina. Very constipated.

## 2021-07-31 NOTE — Telephone Encounter (Signed)
Patient called triage with a message to return her call because she was in pain. I called patient back she states she is at urgent care to discuss matters about her bowel movement. I told her to call us if she needs any thing further.

## 2021-08-03 NOTE — Telephone Encounter (Signed)
Kristen Mahoney, patient will need to contact Gyn to make sure she doesn't have persistent vaginal or pelvic infection.

## 2021-08-05 ENCOUNTER — Ambulatory Visit: Payer: Medicare Other | Admitting: Physician Assistant

## 2021-08-06 DIAGNOSIS — Z1231 Encounter for screening mammogram for malignant neoplasm of breast: Secondary | ICD-10-CM | POA: Diagnosis not present

## 2021-08-10 DIAGNOSIS — R0789 Other chest pain: Secondary | ICD-10-CM | POA: Diagnosis not present

## 2021-08-10 DIAGNOSIS — E785 Hyperlipidemia, unspecified: Secondary | ICD-10-CM | POA: Diagnosis not present

## 2021-08-10 DIAGNOSIS — E119 Type 2 diabetes mellitus without complications: Secondary | ICD-10-CM | POA: Diagnosis not present

## 2021-08-10 DIAGNOSIS — I10 Essential (primary) hypertension: Secondary | ICD-10-CM | POA: Diagnosis not present

## 2021-08-26 ENCOUNTER — Ambulatory Visit (INDEPENDENT_AMBULATORY_CARE_PROVIDER_SITE_OTHER): Payer: Medicare Other | Admitting: Nurse Practitioner

## 2021-08-26 ENCOUNTER — Encounter: Payer: Self-pay | Admitting: Nurse Practitioner

## 2021-08-26 ENCOUNTER — Other Ambulatory Visit: Payer: Self-pay

## 2021-08-26 VITALS — BP 122/78

## 2021-08-26 DIAGNOSIS — L02215 Cutaneous abscess of perineum: Secondary | ICD-10-CM

## 2021-08-26 NOTE — Progress Notes (Signed)
   Acute Office Visit  Subjective:    Patient ID: Kristen Mahoney, female    DOB: January 04, 1946, 75 y.o.   MRN: [id number]   HPI 74 y.o. presents today for possible abscess. 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 Last occurrence was January 2022 where area was drained twice and treated with antibiotics. Negative culture at that time. She has not had any problems since. This one appeared about a week ago in the same area. She feels it fluctuates slightly in size. Only painful with manipulation.    Review of Systems  Constitutional: Negative.   Skin:  Positive for wound (right perineum).      Objective:    Physical Exam Constitutional:      Appearance: Normal appearance.  Genitourinary:     BP 122/78  Wt Readings from Last 3 Encounters:  03/11/21 134 lb (60.8 kg)  02/24/21 140 lb (63.5 kg)  04/15/20 129 lb (58.5 kg)        Assessment & Plan:   Problem List Items Addressed This Visit   None Visit Diagnoses     Abscess of superficial perineal space    -  Primary   Relevant Orders   WOUND CULTURE      Plan: Area drained with digital pressure. No evidence of infection. Culture sent. Apply warm compresses, keep area clean and dry, and may apply antibiotic ointment to area. She is agreeable to plan.      Tamela Gammon DNP, 11:02 AM 08/26/2021

## 2021-08-29 LAB — WOUND CULTURE
MICRO NUMBER:: 12315940
SPECIMEN QUALITY:: ADEQUATE

## 2021-09-07 ENCOUNTER — Ambulatory Visit (INDEPENDENT_AMBULATORY_CARE_PROVIDER_SITE_OTHER): Payer: Medicare Other | Admitting: Neurology

## 2021-09-07 VITALS — BP 145/75 | HR 77 | Ht 62.0 in | Wt 132.0 lb

## 2021-09-07 DIAGNOSIS — Z9989 Dependence on other enabling machines and devices: Secondary | ICD-10-CM

## 2021-09-07 DIAGNOSIS — G4733 Obstructive sleep apnea (adult) (pediatric): Secondary | ICD-10-CM | POA: Diagnosis not present

## 2021-09-07 DIAGNOSIS — G44219 Episodic tension-type headache, not intractable: Secondary | ICD-10-CM | POA: Insufficient documentation

## 2021-09-07 DIAGNOSIS — R634 Abnormal weight loss: Secondary | ICD-10-CM | POA: Diagnosis not present

## 2021-09-07 DIAGNOSIS — G4731 Primary central sleep apnea: Secondary | ICD-10-CM

## 2021-09-07 DIAGNOSIS — Z9114 Patient's other noncompliance with medication regimen: Secondary | ICD-10-CM

## 2021-09-07 MED ORDER — CAYA VA DPRH: 1.0000 | VAGINAL_INSERT | VAGINAL | 1 refills | Status: DC | PRN

## 2021-09-07 NOTE — Progress Notes (Signed)
SLEEP MED[facility]INE CLINIC   Provider:  Larey Seat, M D  Referring Provider:  Dr Terrence Dupont.  Primary Care Physician:  Marda Stalker, PA-C  Chief Complaint  Patient presents with   Follow-up    Room 11. Pt alone.     HPI: RV on  09-07-2021 , ; last previous visit 05-11-2021  with Ward Givens, NP. Kristen Mahoney has lost a lot of weight, she looks well, is now 75 years old,. She reports memory trouble with the content of movies she watches in PM ( but she may doze off )  has some vertigo, has some unintended weight loss, left side of her head hurts  often. She feels as if there a bubbles under the scalp, not too painful but irritation.  HST to see if she still needs sleep CPAP - I just noted her last HST was 02-2021, and indicative of severe apnea. This is surprising.    RCV on 11-05-2020:   " I am still not sleeping- waking too early, independent of CPAP use.  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 she still has episodes of dizziness and complains this is mostly on the left side of head. set up date for machine was 2014 pt is eligible for a new machine. Download was not possible -   PS : she feels better since watching her hydration. Addendum: 11-07-2019, I am dictating the CPAP auto titrator compliance report for Kristen Mahoney, encompassing the dates from 12 October through 06 November 2019.   The patient has used her CPAP on 27 out of 30 days, but on 9 days was not able to fulfill the 4-hour minimum requirement.   Average user time is just 4-1/2 hours.   She still using an S9 AutoSet which will be replaced soon currently with a minimum pressure of 5 and a maximum pressure of 15 and an EPR level of 1 AHI is 3.8 this to be related to central apneas there is not much air leak actually noted this is a moderate degree of air leak with a 95th percentile  of 21.2 L/min.  The 95th percentile pressure is 11.4 cmH2O and is well covered with in the current pressure window.  Her compliance is only 60% and has to improve.    I know that her machine is due for replacement but it seems at this time still to work well and she could keep it as a second machine when she travels for example.   I have pleasure of seeing Kristen Mahoney today who has been a CPAP user since 2008 when she was originally diagnosed with obstructive sleep apnea at an AHI of 29.8. She achieved a lot of weight loss and was for this reason placed on an auto titrating. Last year the 95%t percentile pressure was 12.5 cm water and this year it is 10.8 cm water. This correlates with further weight loss, 23 pounds.  A residual AHI is 3.2 but the majority is central in nature, she continues on her AutoSet was 1 cm EPR she has used to machine 28 out of 30 days but only 18 days for longer than 4 hours. Her sleep has been more fragmented since she is depressed and she wakes up in the middle of the night having removed the mask. She has a dry mouth . This was not all intentional  weight loss, some of this may be attributed to depression. She felt severely depressed she had finally weaned off alprazolam, and she was started on an SSRI, namely Zoloft. She still has a feeling of in a unrest and jitteriness, anxiety and she is excessively daytime sleepy. All this fits depression. Her symptoms have gotten better since she started Zoloft but they have not been yet controlled. I think it may help if she also exercises daily. She has rectal-stool incontinence which makes it hard for her to swim. Epworth 6 points, FSS 20 points.   I have not encountered Kristen Mahoney and a little over 3 years. The patient is here as a new patient as-established patient revisit. She has seen me in the past for her sleep disorder which is obstructive sleep apnea. Her primary care physician is now [facility]. She needs new CPAP supplies  and ongoing follow-up and for this reason brought his CPAP machine here today. As I have seen Kristen Mahoney last she has become a grandmother her grandson just celebrated his second birthday. She is a mother of 2 adult children. She is retired. He is also followed by orthopedist Dr. Elesa Massed and by cardiologist Dr. Terrence Dupont . She has diabetes which has been well controlled as long as she was able to exercise and walk. Due to knee pain she has been less able to exercise. She has no medication side effects that she reports at this time she feels extraordinarily well but she needs new supplies.  Kristen Mahoney's sleep study from 09-05-2007 revealed an AHI of 29.8, supine AHI of 47.4 and nonsupine of 11.1 REM AHI of 32. Periodic leg movements for a few was an arousal index of 3.6 she was snoring. She was titrated to CPAP but then placed on an auto titrator as she achieved weight loss.  She had a CPAP first in 2014. Interval history from 07/13/2016. Kristen Mahoney's apnea has now been well controlled by using an AutoSet between 5 and 15 cm water was 1 cm EPR, the 95th percentile pressure is 12.5 cm her apnea index is 5.0 there still 3.5 obstructive apneas per hour but these are negligible. She is using her machine 7 hours 1 minute each day on average and has a 97% compliance for over 4 hours of use. Based on these data I will see the patient again in one year. Her Epworth sleepiness score today was endorsed at 7 and her fatigue severity at 15 points. There is no evidence of clinical depression.  07-17-2018 , Kristen Mahoney is a 75 y.o. female  is seen here as a  revisit  from Dr. Radene Mahoney for sleep medicine evaluation, CPAP compliance. I have followed and TOvid Mahoney since 2008 when I first diagnosed her with sleep apnea.  She has become 1 of my most compliant long-term CPAP users, continuing to use her machine at 97% compliance 29 out of 30 days with an average use at time of 5 hours 48 minutes.  The machine is an AutoSet relatively new  with a pressure window between 5 and 15 cmH2O 1 cm EPR and a residual AHI of only 2.7.  She does have some central apneas emerging and for this reason we have not raise the pressure any further, the 95th percentile pressure is 10.8 cmH2O.  I see no reason to adjust her pressures she is doing well with the current settings and I will encourage her to use her CPAP as much as she possibly can especially when  she travels and also when she takes naps.  She has benefited from CPAP therapy her Epworth sleepiness score has been continuously under 10 points and is today again endorsed at 7 points, fatigue severity was 15 points.  There is also no evidence of clinical depression also she conveyed that she was a little overwhelmed and emotional as she had to arrange the memorial services for 2 sorority sisters without family of their own. She is awaiting cataract surgery.    RCV on 11-06-2019:   " I am still not sleeping- waking too early, independent of CPAP use.  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 she still has episodes of dizziness and complains this is mostly on the left side of head. set up date for machine was 2014 pt is eligible for a new machine. Download was not possible -   PS : she feels better since watching her hydration. Addendum: 11-07-2019, I am dictating the CPAP auto titrator compliance report for Kristen Mahoney, encompassing the dates from 12 October through 06 November 2019.   The patient has used her CPAP on 27 out of 30 days, but on 9 days was not able to fulfill the 4-hour minimum requirement.   Average user time is just 4-1/2 hours.   She still using an S9 AutoSet which will be replaced soon currently with a minimum pressure of 5 and a maximum pressure of 15 and an EPR level of 1 AHI is 3.8 this to be related to central apneas there is not much air  leak actually noted this is a moderate degree of air leak with a 95th percentile of 21.2 L/min.  The 95th percentile pressure is 11.4 cmH2O and is well covered with in the current pressure window.  Her compliance is only 60% and has to improve.     Review of Systems: Out of a complete 14 system review, the patient complains of only the following symptoms, and all other reviewed systems are negative. Big toe pain,   The patient has transiently suffered from higher blood pressures on higher blood glucose levels due to the steroid treatment of her knee pain. She further has reported snoring at times. Vision joint pain aching muscles and diarrhea and dizziness. She reports rectal bleeding.  Was diagnosed as fistula.  Urinary bleeding- diagnosed with dehydration , urine is cloudy-  On ATB a night - nitrofurantoin.   The patient still socially drinks some alcohol she drinks her coffee or caffeinated sodas occasionally but certainly not daily she is a retired Sales executive. She lives with her husband.  Nausea, jitteriness, bad taste in the mouth, drop in CPAP -. intentional weight loss.  Thyroid normal.  grief reaction.  DRY mouth-   How likely are you to doze in the following situations: 0 = not likely, 1 = slight chance, 2 = moderate chance, 3 = high chance  Sitting and Reading? Watching Television? Sitting inactive in a public place (theater or meeting)? Lying down in the afternoon when circumstances permit? Sitting and talking to someone? Sitting quietly after lunch without alcohol? In a car, while stopped for a few minutes in traffic? As a passenger in a car for an hour without a break? / 24  Total = 6 points/ 24  [employer] is her DME .     Social History   Socioeconomic History  Marital status: Married    Spouse name: Not on file   Number of children: 2   Years of education: Not on file   Highest education level: Not on file  Occupational History   Occupation: Retired     Fish farm manager: RETIRED  Tobacco Use   Smoking status: Former   Smokeless tobacco: Never  Scientific laboratory technician Use: Never used  Substance and Sexual Activity   Alcohol use: Not Currently   Drug use: No   Sexual activity: Not Currently    Birth control/protection: Surgical  Other Topics Concern   Not on file  Social History Narrative   Denies caffeine use.   Social Determinants of Health   Financial Resource Strain: Not on file  Food Insecurity: Not on file  Transportation Needs: Not on file  Physical Activity: Not on file  Stress: Not on file  Social Connections: Not on file  Intimate Partner Violence: Not on file    Family History  Problem Relation Age of Onset   Diabetes Mother    Diabetes Father    Colon cancer Father    Heart disease Father    Hypertension Father    Diabetes Sister    Hypertension Sister    Diabetes Brother    Colon polyps Brother    Prostate cancer Brother    Breast cancer Paternal Aunt        Age 37's   Colon polyps Brother    Heart disease Brother    Stroke Brother    Hyperlipidemia Brother    Colon polyps Sister    Diabetes Sister    Hypertension Sister    Heart disease Sister    Esophageal cancer Neg Hx    Rectal cancer Neg Hx    Stomach cancer Neg Hx     Past Medical History:  Diagnosis Date   Allergy    seasonal   Anxiety    Arthritis    knees,all over   Basal cell carcinoma    Benign breast cyst in female    PATIENT HAS HISTORY OF BREAST CYSTS   Cataract    beginning bilateral   Diabetes mellitus    GERD (gastroesophageal reflux disease)    Herpes progenitalis    Hyperlipidemia    Hypertension    Kidney stone    OSA on CPAP    Sleep apnea    cpap   Urinary, incontinence, stress female    Vertigo     Past Surgical History:  Procedure Laterality Date   ABDOMINAL HYSTERECTOMY  1986   leiomyomata   Basal cell excised     breast cystectomy     right   CHOLECYSTECTOMY  2003   COLONOSCOPY     ear cystectomy      ESOPHAGOGASTRODUODENOSCOPY     KNEE ARTHROSCOPY     right   LITHOTRIPSY     RECTOVAGINAL FISTULA CLOSURE      Current Outpatient Medications  Medication Sig Dispense Refill   acetaminophen (TYLENOL) 500 MG tablet Take 1,000 mg by mouth every 6 (six) hours as needed (for pain.).     aspirin EC 81 MG tablet Take 81 mg by mouth every evening.      atorvastatin (LIPITOR) 20 MG tablet Take 20 mg by mouth daily.      buPROPion (WELLBUTRIN XL) 150 MG 24 hr tablet Take 150 mg by mouth daily.     fluticasone (FLONASE) 50 MCG/ACT nasal spray Place 1 spray into both nostrils daily as  needed for allergies.      hydrocortisone 1 % ointment Apply 1 application topically 3 (three) times daily. (Patient taking differently: Apply 1 application topically 3 (three) times daily as needed for itching.) 30 g 0   hyoscyamine (LEVSIN SL) 0.125 MG SL tablet DISSOLVE 1 TABLET IN MOUTH EVERY 6 HOURS AS NEEDED FOR CRAMPS 120 tablet 0   loratadine (CLARITIN) 10 MG tablet Take 10 mg by mouth daily as needed for allergies.     metFORMIN (GLUCOPHAGE-XR) 500 MG 24 hr tablet Take 500 mg by mouth daily.      metoprolol succinate (TOPROL XL) 50 MG 24 hr tablet Take 1 tablet (50 mg total) by mouth daily. Take with or immediately following a meal. 30 tablet 0   Multiple Vitamins-Minerals (ICAPS) CAPS Take 1 capsule by mouth 2 (two) times daily.      omeprazole (PRILOSEC) 20 MG capsule Take 1 capsule (20 mg total) by mouth 2 (two) times daily before a meal. 180 capsule 3   RESTASIS 0.05 % ophthalmic emulsion Place 1 drop into both eyes every 12 (twelve) hours.     valACYclovir (VALTREX) 500 MG tablet Take one tablet twice daily for 3-5 days as needed with an outbreak, then daily as needed (Patient taking differently: Take 500 mg by mouth 2 (two) times daily as needed (outbreak).) 30 tablet 12   Vitamin D, Cholecalciferol, 400 units TABS Take 400 Units by mouth daily.      vitamin E 180 MG (400 UNITS) capsule Take 400 Units by  mouth 2 (two) times daily.      Current Facility-Administered Medications  Medication Dose Route Frequency Provider Last Rate Last Admin   betamethasone acetate-betamethasone sodium phosphate (CELESTONE) injection 3 mg  3 mg Intramuscular Once Edrick Kins, DPM        Allergies as of 09/07/2021 - Review Complete 09/07/2021  Allergen Reaction Noted   Ciprofloxacin Rash 09/04/2009   Morphine and related Itching 05/07/2011   Fluoxetine  02/04/2021   Sertraline hcl  02/04/2021    Vitals: BP (!) 145/75   Pulse 77   Ht '5\' 2"'$  (1.575 m)   Wt 132 lb (59.9 kg)   BMI 24.14 kg/m  Last Weight:  Wt Readings from Last 1 Encounters:  09/07/21 132 lb (59.9 kg)       Last Height:   Ht Readings from Last 1 Encounters:  09/07/21 '5\' 2"'$  (1.575 m)    Physical exam:  General: The patient is awake, alert , well groomed. Head: Normocephalic, atraumatic. Neck is supple. Mallampati 3   neck circumference: 14 inches. Nasal airflow ; allergies affected. TMJ is not evident . Retrognathia is seen.  Cardiovascular:  Regular rate  Respiratory: Skin:  Without evidence of edema, or rash.  Trunk: BMI is elevated and patient  has normal posture. Speech is fluent without dysarthria, dysphonia or aphasia. Mood and affect are a little depressed.  Cranial nerves:   Pupils are equal in size. Funduscopic exam without evidence of pallor or edema. Extraocular movements  in vertical and horizontal planes intact and without nystagmus. Visual fields  intact.Hearing to finger rub intact.   Facial sensation intact to fine touch. Facial motor strength is symmetric and tongue and uvula move midline. Motor exam:  Normal tone ,muscle bulk and symmetric strength in all extremities.Possible spinal stenosis , hyperreflexia in both knees , in spite of diabetes. Weakness when rising , needs to brace herself.   The patient was advised of the nature of the  diagnosed sleep disorder , the treatment options and risks for general a  health and wellness arising from not treating the condition. Visit duration was 30 minutes.    Assessment:  After physical and neurologic examination, review of laboratory studies, imaging, neurophysiology testing and pre-existing records, assessment is :   The patient feels that CPAP is no longer serving her as well, in spite of weight loss her HST showed a severe sleep apnea- I have my doubts , too. I want to retest her for further weight loss.     Mrs. Mcclammy had remained a highly compliant CPAP user and her new auto titration is working well for her in early 2022 I even feel that they could reduce her maximum pressure given that she has continued to lose some weight. She has used a chin strap.  And T. Schwabe has quit using the CPAP more or less since mid August of this year.  Previously she was a very compliant CPAP user with the use of chinstrap and headgear for CPAP is too much for her and it irritates her scalp.  She also still has a dry mouth.  She endorsed an Epworth sleepiness score of 5 points today in spite of not having used the machine for about 3 weeks fatigue severity score was only 13 points.  Geriatric depression score is negative.   Her AutoSet was set between 5 and 16 cmH2O with 2 cm EPR.  Residual AHI was 3.4 of which the majority of her central apneas 2.3/h.  Still having high air leak 17.2 L/min and pressure is 8.4 cmH2O at the 95th percentile.  So my goal is to invite her for a nighttime study I want to see if this patient truly still needs CPAP or not her home sleep test may have given her some information.  The patient is willing a HST but she has a hair piece woven into the scalp, that cannot be removed.     Plan:  Treatment plan and additional workup :  She can sleep better without CPAP she reports, This is worth looking into. Is she still having true apnea?  In lab study , SPLIT ordered, then cancelled because of the hair piece.   HST once more time.    Rv in 4   month with with NP, we will alternate visits yearly.      Asencion Partridge Bralyn Espino MD  09/07/2021

## 2021-09-07 NOTE — Patient Instructions (Signed)
Insomnia Insomnia is a sleep disorder that makes it difficult to fall asleep or stay asleep. Insomnia can cause fatigue, low energy, difficulty concentrating, moodswings, and poor performance at work or school. There are three different ways to classify insomnia: Difficulty falling asleep. Difficulty staying asleep. Waking up too early in the morning. Any type of insomnia can be long-term (chronic) or short-term (acute). Both are common. Short-term insomnia usually lasts for three months or less. Chronic insomnia occurs at least three times a week for longer than threemonths. What are the causes? Insomnia may be caused by another condition, situation, or substance, such as: Anxiety. Certain medicines. Gastroesophageal reflux disease (GERD) or other gastrointestinal conditions. Asthma or other breathing conditions. Restless legs syndrome, sleep apnea, or other sleep disorders. Chronic pain. Menopause. Stroke. Abuse of alcohol, tobacco, or illegal drugs. Mental health conditions, such as depression. Caffeine. Neurological disorders, such as Alzheimer's disease. An overactive thyroid (hyperthyroidism). Sometimes, the cause of insomnia may not be known. What increases the risk? Risk factors for insomnia include: Gender. Women are affected more often than men. Age. Insomnia is more common as you get older. Stress. Lack of exercise. Irregular work schedule or working night shifts. Traveling between different time zones. Certain medical and mental health conditions. What are the signs or symptoms? If you have insomnia, the main symptom is having trouble falling asleep or having trouble staying asleep. This may lead to other symptoms, such as: Feeling fatigued or having low energy. Feeling nervous about going to sleep. Not feeling rested in the morning. Having trouble concentrating. Feeling irritable, anxious, or depressed. How is this diagnosed? This condition may be diagnosed based  on: Your symptoms and medical history. Your health care provider may ask about: Your sleep habits. Any medical conditions you have. Your mental health. A physical exam. How is this treated? Treatment for insomnia depends on the cause. Treatment may focus on treating an underlying condition that is causing insomnia. Treatment may also include: Medicines to help you sleep. Counseling or therapy. Lifestyle adjustments to help you sleep better. Follow these instructions at home: Eating and drinking  Limit or avoid alcohol, caffeinated beverages, and cigarettes, especially close to bedtime. These can disrupt your sleep. Do not eat a large meal or eat spicy foods right before bedtime. This can lead to digestive discomfort that can make it hard for you to sleep.  Sleep habits  Keep a sleep diary to help you and your health care provider figure out what could be causing your insomnia. Write down: When you sleep. When you wake up during the night. How well you sleep. How rested you feel the next day. Any side effects of medicines you are taking. What you eat and drink. Make your bedroom a dark, comfortable place where it is easy to fall asleep. Put up shades or blackout curtains to block light from outside. Use a white noise machine to block noise. Keep the temperature cool. Limit screen use before bedtime. This includes: Watching TV. Using your smartphone, tablet, or computer. Stick to a routine that includes going to bed and waking up at the same times every day and night. This can help you fall asleep faster. Consider making a quiet activity, such as reading, part of your nighttime routine. Try to avoid taking naps during the day so that you sleep better at night. Get out of bed if you are still awake after 15 minutes of trying to sleep. Keep the lights down, but try reading or doing a quiet   activity. When you feel sleepy, go back to bed.  General instructions Take over-the-counter  and prescription medicines only as told by your health care provider. Exercise regularly, as told by your health care provider. Avoid exercise starting several hours before bedtime. Use relaxation techniques to manage stress. Ask your health care provider to suggest some techniques that may work well for you. These may include: Breathing exercises. Routines to release muscle tension. Visualizing peaceful scenes. Make sure that you drive carefully. Avoid driving if you feel very sleepy. Keep all follow-up visits as told by your health care provider. This is important. Contact a health care provider if: You are tired throughout the day. You have trouble in your daily routine due to sleepiness. You continue to have sleep problems, or your sleep problems get worse. Get help right away if: You have serious thoughts about hurting yourself or someone else. If you ever feel like you may hurt yourself or others, or have thoughts about taking your own life, get help right away. You can go to your nearest emergency department or call: Your local emergency services (911 in the [place] A suicide crisis helpline, such as the National Suicide Prevention Lifeline at 1-800-273-8255. This is open 24 hours a day. Summary Insomnia is a sleep disorder that makes it difficult to fall asleep or stay asleep. Insomnia can be long-term (chronic) or short-term (acute). Treatment for insomnia depends on the cause. Treatment may focus on treating an underlying condition that is causing insomnia. Keep a sleep diary to help you and your health care provider figure out what could be causing your insomnia. This information is not intended to replace advice given to you by your health care provider. Make sure you discuss any questions you have with your healthcare provider. Document Revised: 10/23/2020 Document Reviewed: 10/23/2020 Elsevier Patient Education  2022 [employer]  

## 2021-09-11 DIAGNOSIS — L723 Sebaceous cyst: Secondary | ICD-10-CM | POA: Diagnosis not present

## 2021-09-11 DIAGNOSIS — L819 Disorder of pigmentation, unspecified: Secondary | ICD-10-CM | POA: Diagnosis not present

## 2021-09-11 DIAGNOSIS — Z85828 Personal history of other malignant neoplasm of skin: Secondary | ICD-10-CM | POA: Diagnosis not present

## 2021-09-11 DIAGNOSIS — L811 Chloasma: Secondary | ICD-10-CM | POA: Diagnosis not present

## 2021-09-30 ENCOUNTER — Ambulatory Visit: Payer: Medicare Other | Admitting: Neurology

## 2021-09-30 DIAGNOSIS — G4733 Obstructive sleep apnea (adult) (pediatric): Secondary | ICD-10-CM

## 2021-09-30 DIAGNOSIS — G4731 Primary central sleep apnea: Secondary | ICD-10-CM

## 2021-10-12 ENCOUNTER — Telehealth: Payer: Self-pay

## 2021-10-12 NOTE — Telephone Encounter (Signed)
LVM for pt to call me back to schedule sleep study. Need to redo sleep study as first study did not register.

## 2021-10-15 ENCOUNTER — Encounter: Payer: Self-pay | Admitting: Neurology

## 2021-10-19 ENCOUNTER — Telehealth: Payer: Self-pay | Admitting: *Deleted

## 2021-10-19 NOTE — Telephone Encounter (Addendum)
Kristen Mahoney patient  Patient called c/o stomach cramping and vaginal discharge, slight odor. Patient reports it is the same infection on 07/13/21. Reports the Flagyl 500 mg tablet x 7 days did help treat infection. She is requesting a office visit today, if not today maybe tomorrow. Patient reports she doesn't want to wait until the end of the week for Tiffany to return for treatment. However no today appointments. She asked if Rx refill could be sent to pharmacy? 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 Please advise

## 2021-10-20 ENCOUNTER — Other Ambulatory Visit: Payer: Self-pay

## 2021-10-20 ENCOUNTER — Encounter: Payer: Self-pay | Admitting: Obstetrics and Gynecology

## 2021-10-20 ENCOUNTER — Ambulatory Visit (INDEPENDENT_AMBULATORY_CARE_PROVIDER_SITE_OTHER): Payer: Medicare Other | Admitting: Obstetrics and Gynecology

## 2021-10-20 VITALS — BP 112/80 | HR 88 | Wt 131.0 lb

## 2021-10-20 DIAGNOSIS — B3731 Acute candidiasis of vulva and vagina: Secondary | ICD-10-CM | POA: Diagnosis not present

## 2021-10-20 DIAGNOSIS — Z113 Encounter for screening for infections with a predominantly sexual mode of transmission: Secondary | ICD-10-CM | POA: Diagnosis not present

## 2021-10-20 DIAGNOSIS — R350 Frequency of micturition: Secondary | ICD-10-CM | POA: Diagnosis not present

## 2021-10-20 DIAGNOSIS — R3989 Other symptoms and signs involving the genitourinary system: Secondary | ICD-10-CM

## 2021-10-20 DIAGNOSIS — N9089 Other specified noninflammatory disorders of vulva and perineum: Secondary | ICD-10-CM | POA: Diagnosis not present

## 2021-10-20 DIAGNOSIS — R1030 Lower abdominal pain, unspecified: Secondary | ICD-10-CM

## 2021-10-20 LAB — WET PREP FOR TRICH, YEAST, CLUE

## 2021-10-20 MED ORDER — FLUCONAZOLE 150 MG PO TABS: 150.0000 mg | ORAL_TABLET | Freq: Once | ORAL | 0 refills | Status: AC

## 2021-10-20 MED ORDER — BETAMETHASONE VALERATE 0.1 % EX OINT: TOPICAL_OINTMENT | CUTANEOUS | 0 refills | Status: DC

## 2021-10-20 NOTE — Patient Instructions (Signed)

## 2021-10-20 NOTE — Progress Notes (Signed)
GYNECOLOGY  VISIT   HPI: 75 y.o.   Married Black or [place] American Not Hispanic or Latino  female   [id number] with No LMP recorded. Patient has had a hysterectomy.  H/O TAH.  here for patient states that her left vulva is swollen and tender. She states that she had diarrhea in the past two day. She is having lower abdominal pressure. UA pending.   She c/o a 3 day h/o burning on her left vulva with swelling and tenderness. 2 days ago she noticed a slight, sticky vaginal d/c, no odor. Slightly itchy on the right vulva.  H/O herpes, she hasn't had an outbreak in a long time. She is worried about STD's, her husband has cheated in the past.   She also has some mild pain in her lower abdomen, L>R. No fevers. H/O diverticulitis. She has had issues with constipation and diarrhea, recently issues with diarrhea.  Some pressure to void, no change. No change in urinary frequency or urgency, no dysuria. She does report an increase in bladder pressure.     GYNECOLOGIC HISTORY: No LMP recorded. Patient has had a hysterectomy. Contraception:pmp  Menopausal hormone therapy: none         OB History     Gravida  3   Para  2   Term  2   Preterm      AB  1   Living  2      SAB  1   IAB      Ectopic      Multiple      Live Births                 Patient Active Problem List   Diagnosis Date Noted   Central sleep apnea syndrome 09/07/2021   Poor compliance with continuous positive airway pressure treatment 09/07/2021   Episodic tension-type headache, not intractable 09/07/2021   Gastroesophageal reflux disease without esophagitis 04/03/2021   History of weight loss 04/03/2021   Poor compliance with CPAP treatment 04/03/2021   History of total vaginal hysterectomy (TVH) 03/11/2021   Acute renal failure superimposed on stage 3b chronic kidney disease ([place]) 11/06/2019   Osteoarthritis of right knee 09/21/2019   Rectal pain 08/23/2017   Rectal bleeding 08/23/2017   Incontinence  of feces 07/14/2017   HTN (hypertension), malignant 07/14/2017   Anxiety and depression 07/14/2017   Weight loss, unintentional 07/14/2017   Gas pain 06/17/2017   Bloating 06/17/2017   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   OSA on CPAP 07/13/2016   Trichomonas vaginitis 08/12/2015   Type 2 diabetes mellitus without complications ([place]) 09/98/3382   Kidney stone    DIARRHEA, INFECTIOUS 09/04/2009   BLOOD IN STOOL-MELENA 09/04/2009   Abdominal pain, left lower quadrant 09/04/2009   BLOOD IN STOOL, OCCULT 09/04/2009   HYPERLIPIDEMIA 12/25/2008   Reactive depression 12/25/2008   GERD 12/25/2008   HIATAL HERNIA 12/25/2008   Diverticulosis of colon (without mention of hemorrhage) 12/25/2008   IRRITABLE BOWEL SYNDROME 12/25/2008   RECTOVAGINAL FISTULA 12/25/2008   MITRAL VALVE PROLAPSE, HX OF 12/25/2008   Hiatal hernia 12/25/2008    Past Medical History:  Diagnosis Date   Allergy    seasonal   Anxiety    Arthritis    knees,all over   Basal cell carcinoma    Benign breast cyst in female    PATIENT HAS HISTORY OF BREAST CYSTS   Cataract    beginning bilateral   Diabetes mellitus    GERD (gastroesophageal reflux disease)  Herpes progenitalis    Hyperlipidemia    Hypertension    Kidney stone    OSA on CPAP    Sleep apnea    cpap   Urinary, incontinence, stress female    Vertigo     Past Surgical History:  Procedure Laterality Date   ABDOMINAL HYSTERECTOMY  1986   leiomyomata   Basal cell excised     breast cystectomy     right   CHOLECYSTECTOMY  2003   COLONOSCOPY     ear cystectomy     ESOPHAGOGASTRODUODENOSCOPY     KNEE ARTHROSCOPY     right   LITHOTRIPSY     RECTOVAGINAL FISTULA CLOSURE      Current Outpatient Medications  Medication Sig Dispense Refill   acetaminophen (TYLENOL) 500 MG tablet Take 1,000 mg by mouth every 6 (six) hours as needed (for pain.).     aspirin EC 81 MG tablet Take 81 mg by mouth every evening.      atorvastatin (LIPITOR) 20 MG tablet  Take 20 mg by mouth daily.      buPROPion (WELLBUTRIN XL) 150 MG 24 hr tablet Take 150 mg by mouth daily.     Diaphragm Arc-Spring (CAYA) [place] Place 1 Device vaginally as needed. 10 each 1   fluticasone (FLONASE) 50 MCG/ACT nasal spray Place 1 spray into both nostrils daily as needed for allergies.      hydrocortisone 1 % ointment Apply 1 application topically 3 (three) times daily. (Patient taking differently: Apply 1 application topically 3 (three) times daily as needed for itching.) 30 g 0   hyoscyamine (LEVSIN SL) 0.125 MG SL tablet DISSOLVE 1 TABLET IN MOUTH EVERY 6 HOURS AS NEEDED FOR CRAMPS 120 tablet 0   loratadine (CLARITIN) 10 MG tablet Take 10 mg by mouth daily as needed for allergies.     metFORMIN (GLUCOPHAGE-XR) 500 MG 24 hr tablet Take 500 mg by mouth daily.      metoprolol succinate (TOPROL XL) 50 MG 24 hr tablet Take 1 tablet (50 mg total) by mouth daily. Take with or immediately following a meal. 30 tablet 0   Multiple Vitamins-Minerals (EYE VITAMINS) CAPS Take by mouth.     Multiple Vitamins-Minerals (ICAPS) CAPS Take 1 capsule by mouth 2 (two) times daily.      omeprazole (PRILOSEC) 20 MG capsule Take 1 capsule (20 mg total) by mouth 2 (two) times daily before a meal. 180 capsule 3   RESTASIS 0.05 % ophthalmic emulsion Place 1 drop into both eyes every 12 (twelve) hours.     valACYclovir (VALTREX) 500 MG tablet Take one tablet twice daily for 3-5 days as needed with an outbreak, then daily as needed (Patient taking differently: Take 500 mg by mouth 2 (two) times daily as needed (outbreak).) 30 tablet 12   Vitamin D, Cholecalciferol, 400 units TABS Take 400 Units by mouth daily.      vitamin E 180 MG (400 UNITS) capsule Take 400 Units by mouth 2 (two) times daily.      Current Facility-Administered Medications  Medication Dose Route Frequency Provider Last Rate Last Admin   betamethasone acetate-betamethasone sodium phosphate (CELESTONE) injection 3 mg  3 mg Intramuscular Once  Edrick Kins, DPM         ALLERGIES: Ciprofloxacin, Morphine and related, Fluoxetine, and Sertraline hcl  Family History  Problem Relation Age of Onset   Diabetes Mother    Diabetes Father    Colon cancer Father    Heart disease Father  Hypertension Father    Diabetes Sister    Hypertension Sister    Diabetes Brother    Colon polyps Brother    Prostate cancer Brother    Breast cancer Paternal Aunt        Age 14's   Colon polyps Brother    Heart disease Brother    Stroke Brother    Hyperlipidemia Brother    Colon polyps Sister    Diabetes Sister    Hypertension Sister    Heart disease Sister    Esophageal cancer Neg Hx    Rectal cancer Neg Hx    Stomach cancer Neg Hx     Social History   Socioeconomic History   Marital status: Married    Spouse name: Not on file   Number of children: 2   Years of education: Not on file   Highest education level: Not on file  Occupational History   Occupation: Retired    Fish farm manager: RETIRED  Tobacco Use   Smoking status: Former   Smokeless tobacco: Never  Scientific laboratory technician Use: Never used  Substance and Sexual Activity   Alcohol use: Not Currently   Drug use: No   Sexual activity: Not Currently    Birth control/protection: Surgical  Other Topics Concern   Not on file  Social History Narrative   Denies caffeine use.   Social Determinants of Health   Financial Resource Strain: Not on file  Food Insecurity: Not on file  Transportation Needs: Not on file  Physical Activity: Not on file  Stress: Not on file  Social Connections: Not on file  Intimate Partner Violence: Not on file    Review of Systems  Gastrointestinal:  Positive for abdominal pain and diarrhea.  Genitourinary:  Positive for frequency.  All other systems reviewed and are negative.  PHYSICAL EXAMINATION:    BP 112/80   Pulse 88   Wt 131 lb (59.4 kg)   SpO2 100%   BMI 23.96 kg/m     General appearance: alert, cooperative and appears stated  age Abdomen: soft, mildly tender in the LLQ, no rebound, no guarding; non distended, no masses,  no organomegaly  Pelvic: External genitalia:  no lesions, mildly swollen lower left labia majora, no ulcers, no lumps.               Urethra:  normal appearing urethra with no masses, tenderness or lesions              Bartholins and Skenes: normal                 Vagina: atrophic, mildly erythematous appearing vagina. No abnormal discharge              Cervix: absent              Bimanual Exam:  Uterus:  uterus absent              Adnexa:  no masses, mildly tender on the left               Chaperone was present for exam.  1. Vulvar irritation No focal findings on exam - WET PREP FOR TRICH, YEAST, CLUE - betamethasone valerate ointment (VALISONE) 0.1 %; Use a pea sized amount topically BID for up to one week as needed.  Dispense: 30 g; Refill: 0 -Information on vulvar skin care given and reviewed  2. Yeast vaginitis - fluconazole (DIFLUCAN) 150 MG tablet; Take 1 tablet (150 mg total) by  mouth once for 1 dose. Take one tablet.  Repeat in 72 hours if symptoms are not completely resolved.  Dispense: 2 tablet; Refill: 0  3. Sensation of pressure in bladder area - Urinalysis,Complete w/RFL Culture  4. Screening examination for STD (sexually transmitted disease) - SURESWAB CT/NG/T. Vaginalis -Declines blood work  5. Lower abdominal pain She has associated bowel issues, h/o diverticulitis. She will f/u with her primary.

## 2021-10-20 NOTE — Telephone Encounter (Signed)
Patient informed with below note. Appointments informed to add patient.

## 2021-10-21 LAB — SURESWAB CT/NG/T. VAGINALIS
C. trachomatis RNA, TMA: NOT DETECTED
N. gonorrhoeae RNA, TMA: NOT DETECTED
Trichomonas vaginalis RNA: NOT DETECTED

## 2021-10-22 ENCOUNTER — Other Ambulatory Visit: Payer: Self-pay | Admitting: Obstetrics and Gynecology

## 2021-10-22 DIAGNOSIS — R3129 Other microscopic hematuria: Secondary | ICD-10-CM

## 2021-10-22 LAB — URINE CULTURE
MICRO NUMBER:: 12548064
SPECIMEN QUALITY:: ADEQUATE

## 2021-10-22 LAB — URINALYSIS, COMPLETE W/RFL CULTURE
Bilirubin Urine: NEGATIVE
Glucose, UA: NEGATIVE
Hyaline Cast: NONE SEEN /LPF
Nitrites, Initial: NEGATIVE
Specific Gravity, Urine: 1.025 (ref 1.001–1.035)
pH: 6 (ref 5.0–8.0)

## 2021-10-22 LAB — CULTURE INDICATED

## 2021-11-02 DIAGNOSIS — E785 Hyperlipidemia, unspecified: Secondary | ICD-10-CM | POA: Diagnosis not present

## 2021-11-02 DIAGNOSIS — F419 Anxiety disorder, unspecified: Secondary | ICD-10-CM | POA: Diagnosis not present

## 2021-11-02 DIAGNOSIS — E1169 Type 2 diabetes mellitus with other specified complication: Secondary | ICD-10-CM | POA: Diagnosis not present

## 2021-11-02 DIAGNOSIS — K219 Gastro-esophageal reflux disease without esophagitis: Secondary | ICD-10-CM | POA: Diagnosis not present

## 2021-11-05 ENCOUNTER — Other Ambulatory Visit: Payer: Medicare Other

## 2021-11-05 ENCOUNTER — Other Ambulatory Visit: Payer: Self-pay

## 2021-11-05 ENCOUNTER — Telehealth: Payer: Self-pay

## 2021-11-05 NOTE — Telephone Encounter (Signed)
I received a message from front desk that when patient was in for her lab today for u/a she had medication question.  I called her. SHe has just not been able to pick up Rx's prescribed at her 10/20/21 visit. She simply wanted to confirm directions on the Fluconazole tabs. Directions reviewed.

## 2021-11-09 DIAGNOSIS — R0789 Other chest pain: Secondary | ICD-10-CM | POA: Diagnosis not present

## 2021-11-09 DIAGNOSIS — E785 Hyperlipidemia, unspecified: Secondary | ICD-10-CM | POA: Diagnosis not present

## 2021-11-09 DIAGNOSIS — I10 Essential (primary) hypertension: Secondary | ICD-10-CM | POA: Diagnosis not present

## 2021-11-25 ENCOUNTER — Encounter: Payer: Self-pay | Admitting: Nurse Practitioner

## 2021-11-25 ENCOUNTER — Other Ambulatory Visit: Payer: Self-pay

## 2021-11-25 ENCOUNTER — Ambulatory Visit (INDEPENDENT_AMBULATORY_CARE_PROVIDER_SITE_OTHER): Payer: Medicare Other | Admitting: Nurse Practitioner

## 2021-11-25 ENCOUNTER — Telehealth: Payer: Self-pay | Admitting: *Deleted

## 2021-11-25 VITALS — BP 124/82

## 2021-11-25 DIAGNOSIS — R1032 Left lower quadrant pain: Secondary | ICD-10-CM | POA: Diagnosis not present

## 2021-11-25 DIAGNOSIS — R3129 Other microscopic hematuria: Secondary | ICD-10-CM | POA: Diagnosis not present

## 2021-11-25 DIAGNOSIS — M545 Low back pain, unspecified: Secondary | ICD-10-CM | POA: Diagnosis not present

## 2021-11-25 DIAGNOSIS — R102 Pelvic and perineal pain: Secondary | ICD-10-CM | POA: Diagnosis not present

## 2021-11-25 NOTE — Progress Notes (Signed)
   Acute Office Visit  Subjective:    Patient ID: Kristen Mahoney, female    DOB: 07/01/1946, 75 y.o.   MRN: [id number]   HPI 75 y.o. presents today for lower abdominal pain that started a few days ago. She does not feel it is getting worse. No dysuria, frequency, or urgency. No vaginal symptoms. She feels an ache in her back but it is not one-sided and she is unsure if it is just musculoskeletal. Was seen 10/20/2021 and found to have microscopic hematuria, patient returned for repeat urinalysis 2 weeks later but there is no result in system. RBC 10-20 at that time. S/P hysterectomy. History of diverticulitis with constipation and diarrhea. Just returned from [facility] where she spent Thanksgiving with her family.    Review of Systems  Constitutional: Negative.   Gastrointestinal:  Positive for abdominal pain, constipation and diarrhea. Negative for abdominal distention, blood in stool, nausea and vomiting.  Genitourinary:  Negative for dysuria, flank pain, frequency and urgency.      Objective:    Physical Exam Constitutional:      Appearance: Normal appearance.  Abdominal:     Tenderness: There is abdominal tenderness in the left lower quadrant. There is guarding. There is no right CVA tenderness, left CVA tenderness or rebound.  GU: Not indicated  BP 124/82  Wt Readings from Last 3 Encounters:  10/20/21 131 lb (59.4 kg)  09/07/21 132 lb (59.9 kg)  03/11/21 134 lb (60.8 kg)   UA negative leukocytes, negative nitrates, blood 1+, trace ketones, SG 1.025, yellow/slightly cloudy. Microscopic: wbc 0-5, rbc 10-20, sp epith 6-10, moderate bacteria, hyaline cases 0-1     Assessment & Plan:   Problem List Items Addressed This Visit   None Visit Diagnoses     Left lower quadrant abdominal pain    -  Primary   Microscopic hematuria       Relevant Orders   Urinalysis,Complete w/RFL Culture   Acute bilateral low back pain without sciatica       Relevant Orders   Urinalysis,Complete w/RFL  Culture      Plan: UA not suggestive of infection but does show persistent hematuria. She has seen urology in the past so we will resend referral for further evaluation. LLQ pain appears to be GI in nature and possibly diverticulitis flare. Recommend bland or liquid diet for 1-2 days to see if this improves. If not, it is recommended she follow up with GI or PCP. She is agreeable to plan.      Tamela Gammon DNP, 2:09 PM 11/25/2021

## 2021-11-25 NOTE — Telephone Encounter (Signed)
Patient informed. 

## 2021-11-25 NOTE — Telephone Encounter (Signed)
I called urology and was told patient was last seen in Feb 2022, no new referral needed. Patient scheduled on 11/26/21 @ 1:00pm with Dr. Claudia Desanctis.   Left message for patient to call.

## 2021-11-25 NOTE — Telephone Encounter (Signed)
-----   Message from Tamela Gammon, NP sent at 11/25/2021  2:09 PM EST ----- Regarding: Urology follow up Please resend referral to [facility] (she is unsure of the provider she saw most recently there) for persistent microscopic hematuria. Please send records of today's visit and UA results as well. Thank you.

## 2021-11-26 DIAGNOSIS — N3021 Other chronic cystitis with hematuria: Secondary | ICD-10-CM | POA: Diagnosis not present

## 2021-11-26 DIAGNOSIS — R102 Pelvic and perineal pain: Secondary | ICD-10-CM | POA: Diagnosis not present

## 2021-11-26 DIAGNOSIS — R8279 Other abnormal findings on microbiological examination of urine: Secondary | ICD-10-CM | POA: Diagnosis not present

## 2021-11-27 ENCOUNTER — Other Ambulatory Visit: Payer: Self-pay

## 2021-11-27 ENCOUNTER — Telehealth: Payer: Self-pay | Admitting: Nurse Practitioner

## 2021-11-27 DIAGNOSIS — R1032 Left lower quadrant pain: Secondary | ICD-10-CM

## 2021-11-27 LAB — URINE CULTURE
MICRO NUMBER:: 12696009
SPECIMEN QUALITY:: ADEQUATE

## 2021-11-27 LAB — URINALYSIS, COMPLETE W/RFL CULTURE
Bilirubin Urine: NEGATIVE
Casts: NONE SEEN /LPF
Crystals: NONE SEEN /HPF
Glucose, UA: NEGATIVE
Nitrites, Initial: NEGATIVE
Protein, ur: NEGATIVE
Specific Gravity, Urine: 1.025 (ref 1.001–1.035)
Yeast: NONE SEEN /HPF
pH: 5.5 (ref 5.0–8.0)

## 2021-11-27 LAB — CULTURE INDICATED

## 2021-11-27 MED ORDER — AMOXICILLIN-POT CLAVULANATE 875-125 MG PO TABS: 1.0000 | ORAL_TABLET | Freq: Two times a day (BID) | ORAL | 0 refills | Status: DC

## 2021-11-27 NOTE — Telephone Encounter (Signed)
Pt states she has been having pain in her LLQ for the past 2 days and now the pain is all across her lower abd. She was seen by GYN and urology and gyn thought the pain was gi related. Pt states she thinks she is having a diverticulitis flare. Abdomen sore and having cramping. Please advise.

## 2021-11-27 NOTE — Telephone Encounter (Signed)
Inbound call from patient requesting to speak with a nurse please.  States she has been experiencing lower abdominal pain for the past 2 days.  Please advise.

## 2021-11-27 NOTE — Telephone Encounter (Signed)
Script sent to pharmacy. Pt scheduled to see Dr. Silverio Decamp 12/10/21@9 :30am. CT of A/P ordered and pt knows rad scheduling will contact pt regarding setting up the appt.

## 2021-11-27 NOTE — Telephone Encounter (Signed)
Please send Rx for Augmentin 875mg  BID X 7 days.  Schedule CT abd & pelvis with contrast next available appointment to exclude any other acute pathology Follow up office visit with APP or me next available appt. Thanks

## 2021-12-09 ENCOUNTER — Encounter (HOSPITAL_BASED_OUTPATIENT_CLINIC_OR_DEPARTMENT_OTHER): Payer: Self-pay

## 2021-12-09 ENCOUNTER — Other Ambulatory Visit: Payer: Self-pay

## 2021-12-09 ENCOUNTER — Ambulatory Visit (HOSPITAL_BASED_OUTPATIENT_CLINIC_OR_DEPARTMENT_OTHER)
Admission: RE | Admit: 2021-12-09 | Discharge: 2021-12-09 | Disposition: A | Discharge status: Home / Self Care | Payer: Medicare Other | Source: Ambulatory Visit | Attending: Gastroenterology | Admitting: Gastroenterology

## 2021-12-09 DIAGNOSIS — R1032 Left lower quadrant pain: Secondary | ICD-10-CM | POA: Diagnosis not present

## 2021-12-09 DIAGNOSIS — N200 Calculus of kidney: Secondary | ICD-10-CM | POA: Diagnosis not present

## 2021-12-09 LAB — POCT I-STAT CREATININE: Creatinine, Ser: 0.9 mg/dL (ref 0.44–1.00)

## 2021-12-09 MED ORDER — IOHEXOL 300 MG/ML  SOLN
85.0000 mL | Freq: Once | INTRAMUSCULAR | Status: AC | PRN
  Administered 2021-12-09: 10:00:00 85 mL via INTRAVENOUS

## 2021-12-10 ENCOUNTER — Encounter: Payer: Self-pay | Admitting: Gastroenterology

## 2021-12-10 ENCOUNTER — Ambulatory Visit (INDEPENDENT_AMBULATORY_CARE_PROVIDER_SITE_OTHER): Payer: Medicare Other | Admitting: Gastroenterology

## 2021-12-10 VITALS — BP 128/70 | HR 76 | Ht 62.0 in | Wt 133.0 lb

## 2021-12-10 DIAGNOSIS — K5732 Diverticulitis of large intestine without perforation or abscess without bleeding: Secondary | ICD-10-CM

## 2021-12-10 DIAGNOSIS — R1032 Left lower quadrant pain: Secondary | ICD-10-CM | POA: Diagnosis not present

## 2021-12-10 NOTE — Progress Notes (Signed)
Kristen Mahoney    [id number]    1946-07-19  Primary Care Physician:Wharton, Myrtha Mantis  Referring Physician: Marda Stalker, [place],  [place] [address]   Chief complaint:  abdominal pain  HPI:  75 year old very pleasant female here for follow-up visit for abdominal pain. She was recently treated with course of antibiotics for presumed diverticulitis, last dose was a week ago.  Overall she feels her symptoms have improved. CT abdomen and pelvis with contrast yesterday negative for any acute diverticulitis or complications.  She was having diarrhea when she had episode of left lower quadrant abdominal pain but has since improved.  Denies any rectal bleeding  She is s/p hysterectomy complicated with vaginal fistula with repair in 2010, incompetent anal sphincter with partial rectal prolapse   She is s/p hemorrhoidal band ligation by Dr. Marcello Moores   She is current with colorectal cancer screening.  Denies any decreased appetite or weight loss recently.   GI Hx: Abdominal/pelvic CT 09/07/2019  Bilateral nephrolithiasis is noted. No hydronephrosis or renal obstruction is noted.  Linear hyperdensity is noted posteriorly in the urinary bladder which most likely represents blood, but possible mass cannot be excluded. Cystoscopy is recommended for further evaluation. Aortic Atherosclerosis    Colonoscopy 10/12/2017 by Dr. Silverio Decamp: Decreased sphincter tone found on digital rectal exam. - Diverticulosis in the sigmoid colon and in the descending colon. - The examination was otherwise normal. - No specimens collected. -Repeat colonoscopy in 5 years for screening purposes.   Colonoscopy 04/25/2013 by Dr. Maurene Capes: Incompetent rectal sphincter, evidence of prior rectal prolapse   Outpatient Encounter Medications as of 12/10/2021  Medication Sig   acetaminophen (TYLENOL) 500 MG tablet Take 1,000 mg by mouth every 6 (six) hours as needed (for pain.).    aspirin EC 81 MG tablet Take 81 mg by mouth every evening.    atorvastatin (LIPITOR) 20 MG tablet Take 20 mg by mouth daily.    betamethasone valerate ointment (VALISONE) 0.1 % Use a pea sized amount topically BID for up to one week as needed.   buPROPion (WELLBUTRIN XL) 150 MG 24 hr tablet Take 150 mg by mouth daily.   Diaphragm Arc-Spring (CAYA) [place] Place 1 Device vaginally as needed.   fluticasone (FLONASE) 50 MCG/ACT nasal spray Place 1 spray into both nostrils daily as needed for allergies.    hydrocortisone 1 % ointment Apply 1 application topically 3 (three) times daily. (Patient taking differently: Apply 1 application topically 3 (three) times daily as needed for itching.)   hyoscyamine (LEVSIN SL) 0.125 MG SL tablet DISSOLVE 1 TABLET IN MOUTH EVERY 6 HOURS AS NEEDED FOR CRAMPS   loratadine (CLARITIN) 10 MG tablet Take 10 mg by mouth daily as needed for allergies.   metFORMIN (GLUCOPHAGE-XR) 500 MG 24 hr tablet Take 500 mg by mouth daily.    metoprolol succinate (TOPROL XL) 50 MG 24 hr tablet Take 1 tablet (50 mg total) by mouth daily. Take with or immediately following a meal.   Multiple Vitamins-Minerals (EYE VITAMINS) CAPS Take by mouth.   Multiple Vitamins-Minerals (ICAPS) CAPS Take 1 capsule by mouth 2 (two) times daily.    omeprazole (PRILOSEC) 20 MG capsule Take 1 capsule (20 mg total) by mouth 2 (two) times daily before a meal.   RESTASIS 0.05 % ophthalmic emulsion Place 1 drop into both eyes every 12 (twelve) hours.   valACYclovir (VALTREX) 500 MG tablet Take one tablet twice daily for  3-5 days as needed with an outbreak, then daily as needed (Patient taking differently: Take 500 mg by mouth 2 (two) times daily as needed (outbreak).)   Vitamin D, Cholecalciferol, 400 units TABS Take 400 Units by mouth daily.    vitamin E 180 MG (400 UNITS) capsule Take 400 Units by mouth 2 (two) times daily.    [DISCONTINUED] amoxicillin-clavulanate (AUGMENTIN) 875-125 MG tablet Take 1 tablet  by mouth 2 (two) times daily.   [DISCONTINUED] betamethasone acetate-betamethasone sodium phosphate (CELESTONE) injection 3 mg    No facility-administered encounter medications on file as of 12/10/2021.    Allergies as of 12/10/2021 - Review Complete 12/10/2021  Allergen Reaction Noted   Ciprofloxacin Rash 09/04/2009   Morphine and related Itching 05/07/2011   Fluoxetine  02/04/2021   Sertraline hcl  02/04/2021    Past Medical History:  Diagnosis Date   Allergy    seasonal   Anxiety    Arthritis    knees,all over   Basal cell carcinoma    Benign breast cyst in female    PATIENT HAS HISTORY OF BREAST CYSTS   Cataract    beginning bilateral   Diabetes mellitus    GERD (gastroesophageal reflux disease)    Herpes progenitalis    Hyperlipidemia    Hypertension    Kidney stone    OSA on CPAP    Sleep apnea    cpap   Urinary, incontinence, stress female    Vertigo     Past Surgical History:  Procedure Laterality Date   ABDOMINAL HYSTERECTOMY  1986   leiomyomata   Basal cell excised     breast cystectomy     right   CHOLECYSTECTOMY  2003   COLONOSCOPY     ear cystectomy     ESOPHAGOGASTRODUODENOSCOPY     KNEE ARTHROSCOPY     right   LITHOTRIPSY     RECTOVAGINAL FISTULA CLOSURE      Family History  Problem Relation Age of Onset   Diabetes Mother    Diabetes Father    Colon cancer Father    Heart disease Father    Hypertension Father    Diabetes Sister    Hypertension Sister    Diabetes Brother    Colon polyps Brother    Prostate cancer Brother    Breast cancer Paternal Aunt        Age 72's   Colon polyps Brother    Heart disease Brother    Stroke Brother    Hyperlipidemia Brother    Colon polyps Sister    Diabetes Sister    Hypertension Sister    Heart disease Sister    Esophageal cancer Neg Hx    Rectal cancer Neg Hx    Stomach cancer Neg Hx     Social History   Socioeconomic History   Marital status: Married    Spouse name: Not on file    Number of children: 2   Years of education: Not on file   Highest education level: Not on file  Occupational History   Occupation: Retired    Fish farm manager: RETIRED  Tobacco Use   Smoking status: Former   Smokeless tobacco: Never  Scientific laboratory technician Use: Never used  Substance and Sexual Activity   Alcohol use: Not Currently   Drug use: No   Sexual activity: Not Currently    Birth control/protection: Surgical  Other Topics Concern   Not on file  Social History Narrative   Denies caffeine use.  Social Determinants of Health   Financial Resource Strain: Not on file  Food Insecurity: Not on file  Transportation Needs: Not on file  Physical Activity: Not on file  Stress: Not on file  Social Connections: Not on file  Intimate Partner Violence: Not on file      Review of systems: All other review of systems negative except as mentioned in the HPI.   Physical Exam: Vitals:   12/10/21 0924  BP: 128/70  Pulse: 76   Body mass index is 24.33 kg/m. Gen:      No acute distress HEENT:  sclera anicteric Abd:      soft, non-tender; no palpable masses, no distension Ext:    No edema Neuro: alert and oriented x 3 Psych: normal mood and affect  Data Reviewed:  Reviewed labs, radiology imaging, old records and pertinent past GI work up   Assessment and Plan/Recommendations:  75 year old very pleasant female with history of left side diverticular disease with left lower quadrant abdominal pain likely secondary to acute diverticulitis, has improved with oral antibiotics. CT abdomen and pelvis with contrast negative for any acute pathology She has incompetent anal sphincter with intermittent fecal incontinence and bright red blood per rectum Continue Benefiber 1 tablespoon 3 times daily with meals Use MiraLAX half capful daily as needed to prevent constipation  Use Levsin up to 2 times daily as needed for abdominal cramping and discomfort  Due for recall colonoscopy  October 2023 for colorectal cancer screening  Return in 3 months or sooner if needed  This visit required 40 minutes of patient care (this includes precharting, chart review, review of results, face-to-face time used for counseling as well as treatment plan and follow-up. The patient was provided an opportunity to ask questions and all were answered. The patient agreed with the plan and demonstrated an understanding of the instructions.  Damaris Hippo , MD    CC: Marda Stalker, PA-C

## 2021-12-10 NOTE — Patient Instructions (Signed)
Use Benefiber 1 tablespoon three times a day with meals  Take Miralax 1/2 capful daily  Use Levsin twice a day as needed  Follow up in 3 months  If you are age 76 or older, your body mass index should be between 23-30. Your Body mass index is 24.33 kg/m. If this is out of the aforementioned range listed, please consider follow up with your Primary Care Provider.  If you are age 70 or younger, your body mass index should be between 19-25. Your Body mass index is 24.33 kg/m. If this is out of the aformentioned range listed, please consider follow up with your Primary Care Provider.   ________________________________________________________  The [facility] providers would like to encourage you to use [facility] to communicate with providers for non-urgent requests or questions.  Due to long hold times on the telephone, sending your provider a message by [facility] may be a faster and more efficient way to get a response.  Please allow 48 business hours for a response.  Please remember that this is for non-urgent requests.  _______________________________________________________   I appreciate the  opportunity to care for you  Thank You   Harl Bowie , MD

## 2021-12-14 DIAGNOSIS — L811 Chloasma: Secondary | ICD-10-CM | POA: Diagnosis not present

## 2021-12-14 DIAGNOSIS — L918 Other hypertrophic disorders of the skin: Secondary | ICD-10-CM | POA: Diagnosis not present

## 2021-12-14 DIAGNOSIS — Z85828 Personal history of other malignant neoplasm of skin: Secondary | ICD-10-CM | POA: Diagnosis not present

## 2021-12-22 ENCOUNTER — Other Ambulatory Visit: Payer: Self-pay

## 2021-12-22 ENCOUNTER — Ambulatory Visit (INDEPENDENT_AMBULATORY_CARE_PROVIDER_SITE_OTHER): Payer: Medicare Other | Admitting: Nurse Practitioner

## 2021-12-22 ENCOUNTER — Encounter: Payer: Self-pay | Admitting: Gastroenterology

## 2021-12-22 ENCOUNTER — Emergency Department (HOSPITAL_COMMUNITY): Payer: Medicare Other

## 2021-12-22 ENCOUNTER — Encounter: Payer: Self-pay | Admitting: Nurse Practitioner

## 2021-12-22 ENCOUNTER — Emergency Department (HOSPITAL_COMMUNITY)
Admission: EM | Admit: 2021-12-22 | Discharge: 2021-12-22 | Disposition: A | Discharge status: Home / Self Care | Payer: Medicare Other | Attending: Emergency Medicine | Admitting: Emergency Medicine

## 2021-12-22 ENCOUNTER — Encounter (HOSPITAL_COMMUNITY): Payer: Self-pay | Admitting: Emergency Medicine

## 2021-12-22 VITALS — BP 128/88 | HR 95

## 2021-12-22 DIAGNOSIS — Z87442 Personal history of urinary calculi: Secondary | ICD-10-CM

## 2021-12-22 DIAGNOSIS — R109 Unspecified abdominal pain: Secondary | ICD-10-CM | POA: Diagnosis not present

## 2021-12-22 DIAGNOSIS — Z85828 Personal history of other malignant neoplasm of skin: Secondary | ICD-10-CM | POA: Insufficient documentation

## 2021-12-22 DIAGNOSIS — N1832 Chronic kidney disease, stage 3b: Secondary | ICD-10-CM | POA: Insufficient documentation

## 2021-12-22 DIAGNOSIS — K219 Gastro-esophageal reflux disease without esophagitis: Secondary | ICD-10-CM | POA: Diagnosis not present

## 2021-12-22 DIAGNOSIS — N23 Unspecified renal colic: Secondary | ICD-10-CM | POA: Diagnosis not present

## 2021-12-22 DIAGNOSIS — Z79899 Other long term (current) drug therapy: Secondary | ICD-10-CM | POA: Insufficient documentation

## 2021-12-22 DIAGNOSIS — E119 Type 2 diabetes mellitus without complications: Secondary | ICD-10-CM | POA: Diagnosis not present

## 2021-12-22 DIAGNOSIS — Z7984 Long term (current) use of oral hypoglycemic drugs: Secondary | ICD-10-CM | POA: Diagnosis not present

## 2021-12-22 DIAGNOSIS — R110 Nausea: Secondary | ICD-10-CM | POA: Diagnosis not present

## 2021-12-22 DIAGNOSIS — Z87891 Personal history of nicotine dependence: Secondary | ICD-10-CM | POA: Diagnosis not present

## 2021-12-22 DIAGNOSIS — R310 Gross hematuria: Secondary | ICD-10-CM

## 2021-12-22 DIAGNOSIS — Z7982 Long term (current) use of aspirin: Secondary | ICD-10-CM | POA: Diagnosis not present

## 2021-12-22 DIAGNOSIS — R1031 Right lower quadrant pain: Secondary | ICD-10-CM

## 2021-12-22 DIAGNOSIS — I129 Hypertensive chronic kidney disease with stage 1 through stage 4 chronic kidney disease, or unspecified chronic kidney disease: Secondary | ICD-10-CM | POA: Diagnosis not present

## 2021-12-22 DIAGNOSIS — N83201 Unspecified ovarian cyst, right side: Secondary | ICD-10-CM | POA: Diagnosis not present

## 2021-12-22 DIAGNOSIS — R300 Dysuria: Secondary | ICD-10-CM | POA: Diagnosis not present

## 2021-12-22 DIAGNOSIS — I700 Atherosclerosis of aorta: Secondary | ICD-10-CM | POA: Diagnosis not present

## 2021-12-22 DIAGNOSIS — N132 Hydronephrosis with renal and ureteral calculous obstruction: Secondary | ICD-10-CM | POA: Diagnosis not present

## 2021-12-22 DIAGNOSIS — N3289 Other specified disorders of bladder: Secondary | ICD-10-CM | POA: Diagnosis not present

## 2021-12-22 LAB — BASIC METABOLIC PANEL
Anion gap: 10 (ref 5–15)
BUN: 20 mg/dL (ref 8–23)
CO2: 22 mmol/L (ref 22–32)
Calcium: 9.1 mg/dL (ref 8.9–10.3)
Chloride: 112 mmol/L — ABNORMAL HIGH (ref 98–111)
Creatinine, Ser: 1.12 mg/dL — ABNORMAL HIGH (ref 0.44–1.00)
GFR, Estimated: 51 mL/min — ABNORMAL LOW (ref >60)
Glucose, Bld: 127 mg/dL — ABNORMAL HIGH (ref 70–99)
Potassium: 3.4 mmol/L — ABNORMAL LOW (ref 3.5–5.1)
Sodium: 144 mmol/L (ref 135–145)

## 2021-12-22 LAB — CBC WITH DIFFERENTIAL/PLATELET
Abs Immature Granulocytes: 0.06 10*3/uL (ref 0.00–0.07)
Basophils Absolute: 0.1 10*3/uL (ref 0.0–0.1)
Basophils Relative: 0 %
Eosinophils Absolute: 0 10*3/uL (ref 0.0–0.5)
Eosinophils Relative: 0 %
HCT: 42 % (ref 36.0–46.0)
Hemoglobin: 13.9 g/dL (ref 12.0–15.0)
Immature Granulocytes: 0 %
Lymphocytes Relative: 6 %
Lymphs Abs: 0.8 10*3/uL (ref 0.7–4.0)
MCH: 31 pg (ref 26.0–34.0)
MCHC: 33.1 g/dL (ref 30.0–36.0)
MCV: 93.8 fL (ref 80.0–100.0)
Monocytes Absolute: 0.9 10*3/uL (ref 0.1–1.0)
Monocytes Relative: 7 %
Neutro Abs: 12.1 10*3/uL — ABNORMAL HIGH (ref 1.7–7.7)
Neutrophils Relative %: 87 %
Platelets: 184 10*3/uL (ref 150–400)
RBC: 4.48 MIL/uL (ref 3.87–5.11)
RDW: 13.7 % (ref 11.5–15.5)
WBC: 13.8 10*3/uL — ABNORMAL HIGH (ref 4.0–10.5)
nRBC: 0 % (ref 0.0–0.2)

## 2021-12-22 LAB — URINALYSIS, ROUTINE W REFLEX MICROSCOPIC
Bilirubin Urine: NEGATIVE
Glucose, UA: NEGATIVE mg/dL
Ketones, ur: NEGATIVE mg/dL
Nitrite: NEGATIVE
Protein, ur: 100 mg/dL — AB
RBC / HPF: >50 RBC/hpf — ABNORMAL HIGH (ref 0–5)
Specific Gravity, Urine: 1.02 (ref 1.005–1.030)
pH: 5 (ref 5.0–8.0)

## 2021-12-22 MED ORDER — OXYCODONE HCL 5 MG PO TABS
5.0000 mg | ORAL_TABLET | Freq: Once | ORAL | Status: AC
  Administered 2021-12-22: 21:00:00 5 mg via ORAL
  Filled 2021-12-22: qty 1

## 2021-12-22 MED ORDER — KETOROLAC TROMETHAMINE 15 MG/ML IJ SOLN
15.0000 mg | Freq: Once | INTRAMUSCULAR | Status: AC
  Administered 2021-12-22: 19:00:00 15 mg via INTRAVENOUS
  Filled 2021-12-22: qty 1

## 2021-12-22 MED ORDER — OXYCODONE HCL 5 MG PO TABS: 2.5000 mg | ORAL_TABLET | Freq: Four times a day (QID) | ORAL | 0 refills | Status: DC | PRN

## 2021-12-22 MED ORDER — ONDANSETRON HCL 4 MG/2ML IJ SOLN
4.0000 mg | Freq: Once | INTRAMUSCULAR | Status: AC
  Administered 2021-12-22: 19:00:00 4 mg via INTRAVENOUS
  Filled 2021-12-22: qty 2

## 2021-12-22 MED ORDER — ONDANSETRON 4 MG PO TBDP: 4.0000 mg | ORAL_TABLET | Freq: Three times a day (TID) | ORAL | 0 refills | Status: DC | PRN

## 2021-12-22 MED ORDER — TAMSULOSIN HCL 0.4 MG PO CAPS: 0.4000 mg | ORAL_CAPSULE | Freq: Every day | ORAL | 0 refills | Status: DC

## 2021-12-22 MED ORDER — IBUPROFEN 400 MG PO TABS: 400.0000 mg | ORAL_TABLET | Freq: Four times a day (QID) | ORAL | 0 refills | Status: AC | PRN

## 2021-12-22 NOTE — Discharge Instructions (Addendum)
Please read and follow all provided instructions.  Your diagnoses today include:  1. Ureteral colic     Tests performed today include: Urine test that showed blood in your urine and some infection fighting cells, but doubt urine infection at this time. A urine culture was requested and this result should be available to your urologist.  CT scan which showed 50mm, 37mm, and 62mm millimeter kidney stones on the right side Blood test that showed normal kidney function Vital signs. See below for your results today.   Medications prescribed:  Percocet (oxycodone/acetaminophen) - narcotic pain medication  DO NOT drive or perform any activities that require you to be awake and alert because this medicine can make you drowsy. BE VERY CAREFUL not to take multiple medicines containing Tylenol (also called acetaminophen). Doing so can lead to an overdose which can damage your liver and cause liver failure and possibly death.  Ibuprofen (Motrin, Advil) - anti-inflammatory pain medication Do not exceed 600mg  ibuprofen every 6 hours, take with food  You have been prescribed an anti-inflammatory medication or NSAID. Take with food. Take smallest effective dose for the shortest duration needed for your pain. Stop taking if you experience stomach pain or vomiting.   Flomax (tamsulosin) - relaxes smooth muscle to help kidney stones pass  Zofran (ondansetron) - for nausea and vomiting  Take any prescribed medications only as directed.  Home care instructions:  Follow any educational materials contained in this packet.  Please double your fluid intake for the next several days. Strain your urine and save any stones that may pass.   BE VERY CAREFUL not to take multiple medicines containing Tylenol (also called acetaminophen). Doing so can lead to an overdose which can damage your liver and cause liver failure and possibly death.   Follow-up instructions: Please follow-up with your urologist or the  urologist referral (provided on front page) in the next 1 week for further evaluation of your symptoms.  Return instructions:  If you need to return to the Emergency Department, go to [facility] and not [facility]. The urologists are located at [facility] and can better care for you at this location.  Please return to the Emergency Department if you experience worsening symptoms.  Please return if you develop fever or uncontrolled pain or vomiting. Please return if you have any other emergent concerns.  Additional Information:  Your vital signs today were: BP 140/60    Pulse 73    Temp 98 F (36.7 C) (Oral)    Resp 14    SpO2 97%  If your blood pressure (BP) was elevated above 135/85 this visit, please have this repeated by your doctor within one month. --------------

## 2021-12-22 NOTE — ED Provider Notes (Signed)
[facility] DEPT Provider Note   CSN: [id number] Arrival date & time: 12/22/21  1701     History Chief Complaint  Patient presents with   Flank Pain    NALEYAH Mahoney is a 75 y.o. female.  Patient with history of kidney stones, diabetes --presents to the emergency department for evaluation of right-sided flank pain starting yesterday.  Pain radiates into the right side of the abdomen.  No associated vomiting or diarrhea.  She went and saw her OB/GYN today.  She was found to have hematuria and it was recommended that she go to the emergency department for further evaluation.  Patient states that her current pain feels similar to previous kidney stones.  She has had lithotripsy in the past.  No fevers.  Pain is currently 8 out of 10. The onset of this condition was acute. The course is constant. Aggravating factors: none. Alleviating factors: none.        Past Medical History:  Diagnosis Date   Allergy    seasonal   Anxiety    Arthritis    knees,all over   Basal cell carcinoma    Benign breast cyst in female    PATIENT HAS HISTORY OF BREAST CYSTS   Cataract    beginning bilateral   Diabetes mellitus    GERD (gastroesophageal reflux disease)    Herpes progenitalis    Hyperlipidemia    Hypertension    Kidney stone    OSA on CPAP    Sleep apnea    cpap   Urinary, incontinence, stress female    Vertigo     Patient Active Problem List   Diagnosis Date Noted   Central sleep apnea syndrome 09/07/2021   Poor compliance with continuous positive airway pressure treatment 09/07/2021   Episodic tension-type headache, not intractable 09/07/2021   Gastroesophageal reflux disease without esophagitis 04/03/2021   History of weight loss 04/03/2021   Poor compliance with CPAP treatment 04/03/2021   History of total vaginal hysterectomy (TVH) 03/11/2021   Acute renal failure superimposed on stage 3b chronic kidney disease ([place]) 11/06/2019    Osteoarthritis of right knee 09/21/2019   Rectal pain 08/23/2017   Rectal bleeding 08/23/2017   Incontinence of feces 07/14/2017   HTN (hypertension), malignant 07/14/2017   Anxiety and depression 07/14/2017   Weight loss, unintentional 07/14/2017   Gas pain 06/17/2017   Bloating 06/17/2017   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   OSA on CPAP 07/13/2016   Trichomonas vaginitis 08/12/2015   Type 2 diabetes mellitus without complications ([place]) 88/89/1694   Kidney stone    DIARRHEA, INFECTIOUS 09/04/2009   BLOOD IN STOOL-MELENA 09/04/2009   Abdominal pain, left lower quadrant 09/04/2009   BLOOD IN STOOL, OCCULT 09/04/2009   HYPERLIPIDEMIA 12/25/2008   Reactive depression 12/25/2008   GERD 12/25/2008   HIATAL HERNIA 12/25/2008   Diverticulosis of colon (without mention of hemorrhage) 12/25/2008   IRRITABLE BOWEL SYNDROME 12/25/2008   RECTOVAGINAL FISTULA 12/25/2008   MITRAL VALVE PROLAPSE, HX OF 12/25/2008   Hiatal hernia 12/25/2008    Past Surgical History:  Procedure Laterality Date   ABDOMINAL HYSTERECTOMY  1986   leiomyomata   Basal cell excised     breast cystectomy     right   CHOLECYSTECTOMY  2003   COLONOSCOPY     ear cystectomy     ESOPHAGOGASTRODUODENOSCOPY     KNEE ARTHROSCOPY     right   LITHOTRIPSY     RECTOVAGINAL FISTULA CLOSURE  OB History     Gravida  3   Para  2   Term  2   Preterm      AB  1   Living  2      SAB  1   IAB      Ectopic      Multiple      Live Births              Family History  Problem Relation Age of Onset   Diabetes Mother    Diabetes Father    Colon cancer Father    Heart disease Father    Hypertension Father    Diabetes Sister    Hypertension Sister    Diabetes Brother    Colon polyps Brother    Prostate cancer Brother    Breast cancer Paternal Aunt        Age 29's   Colon polyps Brother    Heart disease Brother    Stroke Brother    Hyperlipidemia Brother    Colon polyps Sister    Diabetes  Sister    Hypertension Sister    Heart disease Sister    Esophageal cancer Neg Hx    Rectal cancer Neg Hx    Stomach cancer Neg Hx     Social History   Tobacco Use   Smoking status: Former   Smokeless tobacco: Never  Scientific laboratory technician Use: Never used  Substance Use Topics   Alcohol use: Not Currently   Drug use: No    Home Medications Prior to Admission medications   Medication Sig Start Date End Date Taking? Authorizing Provider  acetaminophen (TYLENOL) 500 MG tablet Take 1,000 mg by mouth every 6 (six) hours as needed (for pain.).    [provider]  aspirin EC 81 MG tablet Take 81 mg by mouth every evening.     [provider]  atorvastatin (LIPITOR) 20 MG tablet Take 20 mg by mouth daily.  02/14/17   [provider]  betamethasone valerate ointment (VALISONE) 0.1 % Use a pea sized amount topically BID for up to one week as needed. 10/20/21   Kristen Dom, MD  buPROPion (WELLBUTRIN XL) 150 MG 24 hr tablet Take 150 mg by mouth daily. 08/08/19   [provider]  Diaphragm Arc-[place]) [place] 1 Device vaginally as needed. 09/07/21   Dohmeier, Asencion Part[place], MD  fluticasone (FLONASE) 50 MCG/ACT nasal spray Place 1 spray into both nostrils daily as needed for allergies.  04/02/19   [provider]  hydrocortisone 1 % ointment Apply 1 application topically 3 (three) times daily. Patient taking differently: Apply 1 application topically 3 (three) times daily as needed for itching. 09/26/18   Esterwood, Amy S, PA-C  hyoscyamine (LEVSIN SL) 0.125 MG SL tablet DISSOLVE 1 TABLET IN MOUTH EVERY 6 HOURS AS NEEDED FOR CRAMPS 05/07/21   Mauri Pole, MD  loratadine (CLARITIN) 10 MG tablet Take 10 mg by mouth daily as needed for allergies.    [provider]  metFORMIN (GLUCOPHAGE-XR) 500 MG 24 hr tablet Take 500 mg by mouth daily.  07/23/19   [provider]  metoprolol succinate (TOPROL XL) 50 MG 24 hr tablet Take 1  tablet (50 mg total) by mouth daily. Take with or immediately following a meal. 12/29/12   Mabe, Forbes Cellar, MD  Multiple Vitamins-Minerals (EYE VITAMINS) CAPS Take by mouth.    [provider]  Multiple Vitamins-Minerals (ICAPS) CAPS Take 1  capsule by mouth 2 (two) times daily.     [provider]  omeprazole (PRILOSEC) 20 MG capsule Take 1 capsule (20 mg total) by mouth 2 (two) times daily before a meal. 09/08/18   Nandigam, Venia Minks, MD  RESTASIS 0.05 % ophthalmic emulsion Place 1 drop into both eyes every 12 (twelve) hours. 06/28/19   [provider]  valACYclovir (VALTREX) 500 MG tablet Take one tablet twice daily for 3-5 days as needed with an outbreak, then daily as needed Patient taking differently: Take 500 mg by mouth 2 (two) times daily as needed (outbreak). 03/07/19   [name], NP  Vitamin D, Cholecalciferol, 400 units TABS Take 400 Units by mouth daily.     [provider]  vitamin E 180 MG (400 UNITS) capsule Take 400 Units by mouth 2 (two) times daily.     [provider]    Allergies    Ciprofloxacin, Morphine and related, Fluoxetine, and Sertraline hcl  Review of Systems   Review of Systems  Constitutional:  Negative for fever.  HENT:  Negative for rhinorrhea and sore throat.   Eyes:  Negative for redness.  Respiratory:  Negative for cough.   Cardiovascular:  Negative for chest pain.  Gastrointestinal:  Positive for abdominal pain and nausea. Negative for diarrhea and vomiting.  Genitourinary:  Positive for flank pain and hematuria. Negative for dysuria, frequency and urgency.  Musculoskeletal:  Negative for myalgias.  Skin:  Negative for rash.  Neurological:  Negative for headaches.   Physical Exam Updated Vital Signs BP (!) 171/75 (BP Location: Left Arm)    Pulse 82    Temp 98 F (36.7 C) (Oral)    Resp 19    SpO2 99%   Physical Exam Vitals and nursing note reviewed.  Constitutional:      General: She is not in acute  distress.    Appearance: She is well-developed.  HENT:     Head: Normocephalic and atraumatic.     Right Ear: External ear normal.     Left Ear: External ear normal.     Nose: Nose normal.  Eyes:     Conjunctiva/sclera: Conjunctivae normal.  Cardiovascular:     Rate and Rhythm: Normal rate and regular rhythm.     Heart sounds: No murmur heard. Pulmonary:     Effort: No respiratory distress.     Breath sounds: No wheezing, rhonchi or rales.  Abdominal:     Palpations: Abdomen is soft.     Tenderness: There is abdominal tenderness. There is no guarding or rebound.     Comments: Mild right-sided tenderness without rebound or guarding  Musculoskeletal:     Cervical back: Normal range of motion and neck supple.     Right lower leg: No edema.     Left lower leg: No edema.  Skin:    General: Skin is warm and dry.     Findings: No rash.  Neurological:     General: No focal deficit present.     Mental Status: She is alert. Mental status is at baseline.     Motor: No weakness.  Psychiatric:        Mood and Affect: Mood normal.    ED Results / Procedures / Treatments   Labs (all labs ordered are listed, but only abnormal results are displayed) Labs Reviewed  URINALYSIS, ROUTINE W REFLEX MICROSCOPIC - Abnormal; Notable for the following components:      Result Value   APPearance CLOUDY (*)  Hgb urine dipstick LARGE (*)    Protein, ur 100 (*)    Leukocytes,Ua LARGE (*)    RBC / HPF >50 (*)    Bacteria, UA RARE (*)    All other components within normal limits  BASIC METABOLIC PANEL - Abnormal; Notable for the following components:   Potassium 3.4 (*)    Chloride 112 (*)    Glucose, Bld 127 (*)    Creatinine, Ser 1.12 (*)    GFR, Estimated 51 (*)    All other components within normal limits  CBC WITH DIFFERENTIAL/PLATELET - Abnormal; Notable for the following components:   WBC 13.8 (*)    Neutro Abs 12.1 (*)    All other components within normal limits  URINE CULTURE     EKG None  Radiology CT Renal Stone Study  Result Date: 12/22/2021 CLINICAL DATA:  Flank pain.  Concern for kidney stone. EXAM: CT ABDOMEN AND PELVIS WITHOUT CONTRAST TECHNIQUE: Multidetector CT imaging of the abdomen and pelvis was performed following the standard protocol without IV contrast. COMPARISON:  CT dated 12/09/2021. FINDINGS: Evaluation of this exam is limited in the absence of intravenous contrast. Lower chest: Chronic area of scarring at the right lung base. No acute findings. No intra-abdominal free air or free fluid. Hepatobiliary: The liver is unremarkable. No intrahepatic biliary dilatation. Cholecystectomy. No retained calcified stone noted in the central CBD. Pancreas: Unremarkable. No pancreatic ductal dilatation or surrounding inflammatory changes. Spleen: Normal in size without focal abnormality. Adrenals/Urinary Tract: The adrenal glands are unremarkable. There is an 8 mm stone at the right ureteropelvic junction. There is associated mild right hydronephrosis. Two additional stones noted in the region of the right ureterovesical junction measuring 5 mm and 6 mm. Several additional nonobstructing bilateral renal calculi noted. There is no hydronephrosis on the left. The left ureter is unremarkable. The urinary bladder is minimally distended. Stomach/Bowel: There is no bowel obstruction or active inflammation. The appendix is normal. Vascular/Lymphatic: Mild aortoiliac atherosclerotic disease. The IVC is unremarkable. No portal venous gas. There is no adenopathy. Reproductive: Hysterectomy. There is a 3 cm right ovarian cyst. No follow-up imaging recommended. Note: This recommendation does not apply to premenarchal patients and to those with increased risk (genetic, family history, elevated tumor markers or other high-risk factors) of ovarian cancer. Reference: JACR 2020 Feb; 17(2):248-254 Other: None Musculoskeletal: Degenerative changes of the spine. No acute osseous pathology.  IMPRESSION: 1. A 8 mm right UPJ stone with mild right hydronephrosis. Two additional stones noted in the region of the right ureterovesical junction measuring 5 mm and 6 mm. 2. Additional nonobstructing bilateral renal calculi. No hydronephrosis on the left. 3. No bowel obstruction. Normal appendix. 4. Aortic Atherosclerosis (ICD10-I70.0). Electronically Signed   By: Anner Crete M.D.   On: 12/22/2021 19:31    Procedures Procedures   Medications Ordered in ED Medications  ketorolac (TORADOL) 15 MG/ML injection 15 mg (has no administration in time range)  ondansetron (ZOFRAN) injection 4 mg (has no administration in time range)    ED Course  I have reviewed the triage vital signs and the nursing notes.  Pertinent labs & imaging results that were available during my care of the patient were reviewed by me and considered in my medical decision making (see chart for details).  Patient seen and examined. Plan discussed with patient.   Labs: CBC, BMP, UA  Imaging: CT renal protocol  Medications/Fluids: IV Toradol, Zofran  Vital signs reviewed and are as follows: BP (!) 171/75 (BP Location:  Left Arm)    Pulse 82    Temp 98 F (36.7 C) (Oral)    Resp 19    SpO2 99%   Initial impression: Right-sided ureteral colic  Patient received good relief of pain after IV Toradol.  She was transitioned over to a dose of p.o. oxycodone without significant side effects.  She is tolerating oral fluids.  States that her pain is 90% gone.  CT imaging shows 3 ureteral stones on the right side.  The largest of which is 8 mm proximally.  Discussed this with patient and need for close urology follow-up and recheck.  She has an appointment with her urologist, Dr. Claudia Desanctis, in 2 days.  Patient discussed with and seen by Dr. Ashok Cordia.   Patient tolerating crackers and water at bedside.  Delay in obtaining urinalysis.  This returned with blood and some white blood cells.  Overall I have low concern for UTI/pyelo.   We will send urine culture.  Patient otherwise ready for discharge.  11:44 PM Patient counseled on kidney stone treatment. Urged patient to strain urine and save any stones. Urged urology follow-up and return to [facility] with any complications. Counseled patient to maintain good fluid intake.   Counseled patient on use of Flomax.   Patient counseled on use of narcotic pain medications. Counseled not to combine these medications with others containing tylenol. Urged not to drink alcohol, drive, or perform any other activities that requires focus while taking these medications. The patient verbalizes understanding and agrees with the plan.    MDM Rules/Calculators/A&P                          Patient with ureteral colic, symptoms controlled.  Elevated white blood cell count, likely due to discomfort.  No fevers, tachycardia or other signs of sepsis.  Low concern for pyelonephritis.  Urine with blood, some white blood cells, culture pending.  Looks well at time of discharge.  She has an appointment with her urologist in 2 days.       Final Clinical Impression(s) / ED Diagnoses Final diagnoses:  Ureteral colic    Rx / DC Orders ED Discharge Orders          Ordered    oxyCODONE (OXY IR/ROXICODONE) 5 MG immediate release tablet  Every 6 hours PRN        12/22/21 2324    ondansetron (ZOFRAN-ODT) 4 MG disintegrating tablet  Every 8 hours PRN        12/22/21 2324    tamsulosin (FLOMAX) 0.4 MG CAPS capsule  Daily        12/22/21 2324    ibuprofen (ADVIL) 400 MG tablet  Every 6 hours PRN        12/22/21 2324             Carlisle Cater, PA-C 12/22/21 2346    Lajean Saver, MD 12/25/21 [phone]

## 2021-12-22 NOTE — Progress Notes (Addendum)
° °  Acute Office Visit  Subjective:    Patient ID: Kristen Mahoney, female    DOB: 1946-06-06, 75 y.o.   MRN: [id number]   HPI 75 y.o. presents today for hematuria and RLQ pains. Symptoms began yesterday and have worsened significantly throughout today. Denies any other urinary symptoms. CT 11/29/2021 showed multiple small calculi seen in right kidney with largest measuring 6 mm, otherwise unremarkable. Personal history of kidney stones.    Review of Systems  Constitutional: Negative.   Gastrointestinal:  Positive for abdominal pain.  Genitourinary:  Positive for flank pain and hematuria. Negative for dysuria, frequency and urgency.      Objective:    Physical Exam Constitutional:      Appearance: Normal appearance.  Abdominal:     Tenderness: There is abdominal tenderness in the right lower quadrant. There is right CVA tenderness. There is no left CVA tenderness.  GU: Not indicated  BP 128/88    Pulse 95    SpO2 98%  Wt Readings from Last 3 Encounters:  12/10/21 133 lb (60.3 kg)  10/20/21 131 lb (59.4 kg)  09/07/21 132 lb (59.9 kg)   UA: negative leukocytes, negative nitrites, 3+ blood, 3+ protein, 1+ ketones, 2+ bilirubin, SG 1.032, Brown/cloudy. Microscopic: wbc 6-10, rbc packed, sq epith 6-10, many bacteria, moderate calcium ox     Assessment & Plan:   Problem List Items Addressed This Visit   None Visit Diagnoses     Gross hematuria    -  Primary   Relevant Orders   Urinalysis,Complete w/RFL Culture (Completed)   Urine Culture   REFLEXIVE URINE CULTURE (Completed)   History of renal calculi       Right lower quadrant abdominal pain       Right flank pain          Plan: Due to increasing severity of symptoms, gross hematuria, and recent CT scan, renal calculi are likely cause. Recommend patient report to ED for evaluation. She declines emergency transportation and prefers to drive self. She will notify husband. She is agreeable to plan.      [place], 4:44 PM 12/22/2021

## 2021-12-22 NOTE — ED Triage Notes (Signed)
Patient here from home reporting right sided abd pain radiating around to back. Possible kidney stone hx of same. Blood in urine today at [facility].

## 2021-12-23 LAB — URINE CULTURE

## 2021-12-24 DIAGNOSIS — N201 Calculus of ureter: Secondary | ICD-10-CM | POA: Diagnosis not present

## 2021-12-24 DIAGNOSIS — R1084 Generalized abdominal pain: Secondary | ICD-10-CM | POA: Diagnosis not present

## 2021-12-24 LAB — URINE CULTURE
MICRO NUMBER:: 12799494
SPECIMEN QUALITY:: ADEQUATE

## 2021-12-24 LAB — URINALYSIS, COMPLETE W/RFL CULTURE
Casts: NONE SEEN /LPF
Glucose, UA: NEGATIVE
Hyaline Cast: NONE SEEN /LPF
Leukocyte Esterase: NEGATIVE
Nitrites, Initial: NEGATIVE
Specific Gravity, Urine: 1.032 (ref 1.001–1.035)
pH: 5.5 (ref 5.0–8.0)

## 2021-12-24 LAB — CULTURE INDICATED

## 2021-12-31 DIAGNOSIS — N202 Calculus of kidney with calculus of ureter: Secondary | ICD-10-CM | POA: Diagnosis not present

## 2022-01-06 ENCOUNTER — Other Ambulatory Visit: Payer: Self-pay | Admitting: Urology

## 2022-01-08 ENCOUNTER — Other Ambulatory Visit: Payer: Self-pay

## 2022-01-08 ENCOUNTER — Encounter (HOSPITAL_COMMUNITY): Payer: Self-pay

## 2022-01-08 ENCOUNTER — Encounter (HOSPITAL_COMMUNITY)
Admission: RE | Admit: 2022-01-08 | Discharge: 2022-01-08 | Disposition: A | Discharge status: Home / Self Care | Payer: Medicare Other | Source: Ambulatory Visit | Attending: Urology | Admitting: Urology

## 2022-01-08 VITALS — BP 148/78 | HR 91 | Temp 98.5°F | Resp 18 | Ht 61.5 in | Wt 130.0 lb

## 2022-01-08 DIAGNOSIS — E119 Type 2 diabetes mellitus without complications: Secondary | ICD-10-CM | POA: Diagnosis not present

## 2022-01-08 DIAGNOSIS — I10 Essential (primary) hypertension: Secondary | ICD-10-CM | POA: Diagnosis not present

## 2022-01-08 DIAGNOSIS — Z01818 Encounter for other preprocedural examination: Secondary | ICD-10-CM | POA: Insufficient documentation

## 2022-01-08 HISTORY — DX: Headache, unspecified: R51.9

## 2022-01-08 LAB — HEMOGLOBIN A1C
Hgb A1c MFr Bld: 6.4 % — ABNORMAL HIGH (ref 4.8–5.6)
Mean Plasma Glucose: 136.98 mg/dL

## 2022-01-08 LAB — GLUCOSE, CAPILLARY: Glucose-Capillary: 112 mg/dL — ABNORMAL HIGH (ref 70–99)

## 2022-01-08 NOTE — Patient Instructions (Addendum)
DUE TO COVID-19 ONLY ONE VISITOR IS ALLOWED TO COME WITH YOU AND STAY IN THE WAITING ROOM ONLY DURING PRE OP AND PROCEDURE.   **NO VISITORS ARE ALLOWED IN THE SHORT STAY AREA OR RECOVERY ROOM!!**       Your procedure is scheduled on: 01/12/22   Report to [facility] Main Entrance    Report to admitting at 1:30 PM   Call this number if you have problems the morning of surgery [phone]   Do not eat food :After Midnight.   May have liquids until 12:45 PM day of surgery  CLEAR LIQUID DIET  Foods Allowed                                                                     Foods Excluded  Water, Black Coffee and tea (no milk or creamer)           liquids that you cannot  Plain Jell-O in any flavor  (No red)                                    see through such as: Fruit ices (not with fruit pulp)                                            milk, soups, orange juice              Iced Popsicles (No red)                                                All solid food                                   Apple juices Sports drinks like Gatorade (No red) Lightly seasoned clear broth or consume(fat free) Sugar   Oral Hygiene is also important to reduce your risk of infection.                                    Remember - BRUSH YOUR TEETH THE MORNING OF SURGERY WITH YOUR REGULAR TOOTHPASTE   Take these medicines the morning of surgery with A SIP OF WATER: Tylenol, Lipitor, Wellbutrin, Metoprolol, Omeprazole, Zofran, Oxycodone   DO NOT TAKE ANY ORAL DIABETIC MEDICATIONS DAY OF YOUR SURGERY  How to Manage Your Diabetes Before and After Surgery  Why is it important to control my blood sugar before and after surgery? Improving blood sugar levels before and after surgery helps healing and can limit problems. A way of improving blood sugar control is eating a healthy diet by:  Eating less sugar and carbohydrates  Increasing activity/exercise  Talking with your doctor about reaching your  blood sugar goals High blood sugars (greater than 180 mg/dL) can raise your risk of infections  and slow your recovery, so you will need to focus on controlling your diabetes during the weeks before surgery. Make sure that the doctor who takes care of your diabetes knows about your planned surgery including the date and location.  How do I manage my blood sugar before surgery? Check your blood sugar at least 4 times a day, starting 2 days before surgery, to make sure that the level is not too high or low. Check your blood sugar the morning of your surgery when you wake up and every 2 hours until you get to the Short Stay unit. If your blood sugar is less than 70 mg/dL, you will need to treat for low blood sugar: Do not take insulin. Treat a low blood sugar (less than 70 mg/dL) with  cup of clear juice (cranberry or apple), 4 glucose tablets, OR glucose gel. Recheck blood sugar in 15 minutes after treatment (to make sure it is greater than 70 mg/dL). If your blood sugar is not greater than 70 mg/dL on recheck, call [phone] for further instructions. Report your blood sugar to the short stay nurse when you get to Short Stay.  If you are admitted to the hospital after surgery: Your blood sugar will be checked by the staff and you will probably be given insulin after surgery (instead of oral diabetes medicines) to make sure you have good blood sugar levels. The goal for blood sugar control after surgery is 80-180 mg/dL.   WHAT DO I DO ABOUT MY DIABETES MEDICATION?  Do not take oral diabetes medicines (pills) the morning of surgery.  THE DAY BEFORE SURGERY, take Metformin as prescribed       THE MORNING OF SURGERY, do not take Metformin   Reviewed and Endorsed by [facility] Patient Education Committee, August 2015                               You may not have any metal on your body including hair pins, jewelry, and body piercing             Do not wear make-up, lotions, powders,  perfumes, or deodorant  Do not wear nail polish including gel and S&S, artificial/acrylic nails, or any other type of covering on natural nails including finger and toenails. If you have artificial nails, gel coating, etc. that needs to be removed by a nail salon please have this removed prior to surgery or surgery may need to be canceled/ delayed if the surgeon/ anesthesia feels like they are unable to be safely monitored.   Do not shave  48 hours prior to surgery.    Do not bring valuables to the hospital. [place].   Contacts, dentures or bridgework may not be worn into surgery.    Patients discharged on the day of surgery will not be allowed to drive home.  Someone needs to stay with you for the first 24 hours after anesthesia.              Please read over the following fact sheets you were given: IF YOU HAVE QUESTIONS ABOUT YOUR PRE-OP INSTRUCTIONS PLEASE CALL [facility] - Preparing for Surgery Before surgery, you can play an important role.  Because skin is not sterile, your skin needs to be as free of germs as  possible.  You can reduce the number of germs on your skin by washing with CHG (chlorahexidine gluconate) soap before surgery.  CHG is an antiseptic cleaner which kills germs and bonds with the skin to continue killing germs even after washing. Please DO NOT use if you have an allergy to CHG or antibacterial soaps.  If your skin becomes reddened/irritated stop using the CHG and inform your nurse when you arrive at Short Stay. Do not shave (including legs and underarms) for at least 48 hours prior to the first CHG shower.  You may shave your face/neck.  Please follow these instructions carefully:  1.  Shower with CHG Soap the night before surgery and the  morning of surgery.  2.  If you choose to wash your hair, wash your hair first as usual with your normal  shampoo.  3.  After you shampoo, rinse your  hair and body thoroughly to remove the shampoo.                             4.  Use CHG as you would any other liquid soap.  You can apply chg directly to the skin and wash.  Gently with a scrungie or clean washcloth.  5.  Apply the CHG Soap to your body ONLY FROM THE NECK DOWN.   Do   not use on face/ open                           Wound or open sores. Avoid contact with eyes, ears mouth and   genitals (private parts).                       Wash face,  Genitals (private parts) with your normal soap.             6.  Wash thoroughly, paying special attention to the area where your    surgery  will be performed.  7.  Thoroughly rinse your body with warm water from the neck down.  8.  DO NOT shower/wash with your normal soap after using and rinsing off the CHG Soap.                9.  Pat yourself dry with a clean towel.            10.  Wear clean pajamas.            11.  Place clean sheets on your bed the night of your first shower and do not  sleep with pets. Day of Surgery : Do not apply any lotions/deodorants the morning of surgery.  Please wear clean clothes to the hospital/surgery center.  FAILURE TO FOLLOW THESE INSTRUCTIONS MAY RESULT IN THE CANCELLATION OF YOUR SURGERY  PATIENT SIGNATURE_________________________________  NURSE SIGNATURE__________________________________  ________________________________________________________________________

## 2022-01-08 NOTE — H&P (Signed)
CC/HPI: cc: kidney stones, chronic cystitis, pelvic pain   07/31/20: 76 year old woman with a history of kidney stones, chronic cystitis on 25 mg nitrofurantoin for prophylaxis, and pelvic pain currently undergoing pelvic floor physical therapy who is here to establish care. She was a previous patient of Dr. Alyson Ingles. She is doing okay overall. She denies any recent UTIs, gross hematuria or passing kidney stones. Her rectal prolapse is bothering her and she is followed by [facility]. She is currently undergoing pelvic floor physical therapy for pelvic pain which she feels like is really helping her. She had a CT scan either last visit which showed bilateral small nonobstructing renal calculi. Patient reports that she has felt bloated the last week or so and also had thoracic back pain located over her spine. She denies any flank pain, renal colic, gross hematuria.   02/12/21: 76 year old woman with hip of history comes in for 6 month follow-up. She has done well with nitrofurantoin prophylaxis and has not had any breakthrough UTIs. Her pelvic pain is well controlled and she is not currently doing pelvic floor physical therapy. In addition she has not passed any kidney stones in the last 6 months. She has been having recurrent cyst on her labia that have been opened by her gynecologist.   11/26/21: 76 year old woman with a history of chronic cystitis, pelvic pain and kidney stones here for follow-up. She was told that she had blood in her urine at her GYN visit yesterday. Urinalysis today shows positive dipstick however microscopic analysis is negative for hematuria. Patient has diverticulitis right now which is causing left lower quadrant pain. She also reports abdominal bloating and generalized abdominal discomfort. She is no longer taking nitrofurantoin for UTIs. She has done pelvic floor physical therapy in the past for pelvic pain.   12/24/21: 76 year old woman with a history of chronic cystitis, pelvic pain,  and kidney stones here for cystoscopy. Patient initially referred for microscopic hematuria. In the interim she went to the ED with abdominal pain and was found to have 3 ureteral calculi on the right-hand side. Two at the UVJ and 1 was at the proximal ureter. We decided to postpone cystoscopy today as hematuria likely due to obstructing ureteral calculi. Pain is controlled with half of oxycodone tab and Motrin. No fevers, chills, emesis. Patient would like to try and pass the stones on her own.   12/31/20: Ms. Surprenant is a 76 year old female who was found to have 3 ureteral stones in her right ureter. Unfortunately, one is at the proximal ureter and the other 2 are within the distal. She has not passed any fragments. She continues to have some right-sided pain and discomfort. She states this is managed with Motrin. She continues tamsulosin. She denies fevers and chills.     ALLERGIES: Cipro TABS Morphine Derivatives    MEDICATIONS: Aspirin 81 mg tablet,chewable  Metformin Hcl 500 mg tablet  Metoprolol Succinate 50 mg tablet, extended release 24 hr  Omeprazole 20 mg capsule,delayed release  Tamsulosin Hcl 0.4 mg capsule 1 capsule PO Q HS  Acyclovir 200 mg capsule  Atorvastatin Calcium 10 mg tablet  Bupropion Hcl Sr 150 mg tablet, extended release 12 hr  Caltrate 600 + D  Claritin  Estradiol 0.02% (Estrace Equivalent) 1 gram Transdermal Q HS Place 1 g of cream in the vagina nightly for 14 days then twice a week thereafter.  Flonase Allergy Relief  Hydrocortisone 0.5 % cream  Ibuprofen  I-Caps  Nitrofurantoin 25 mg capsule  Ondansetron Hcl  Oxycodone Hcl  Restasis  Stool Softner  Vitamin D3 25 mcg (1,000 unit) capsule  Vitamin E  Zofran     GU PSH: Cystoscopy - 09/20/2019 ESWL - 2017, 2008       [facility] Notes: Renal Lithotripsy, Knee Surgery, Breast Surgery Puncture Aspiration Of Cyst, Lithotripsy - Whole Body (Extracorporeal Shock Wave), rectal surgery-12\2020   NON-GU PSH: Drain  Breast Lesion - 2017     GU PMH: Flank Pain - 12/24/2021, - 09/18/2020, - 09/03/2020, - 01/30/2020 Ureteral calculus (Stable) - 12/24/2021, Calculus of left ureter, - 2017 Chronic cystitis (with hematuria) - 11/26/2021, Patient has remained on 25 mg nitrofurantoin q.h.s. and done well with this. She would like to continue., - 02/12/2021, - 09/18/2020, - 02/08/2020, - 01/30/2020, - 01/04/2020, - 10/04/2019 Pelvic/perineal pain - 11/26/2021, Patient's pelvic pain is controlled and she is not currently undergoing pelvic floor physical therapy., - 02/12/2021, - 09/18/2020, - 09/03/2020, - 08/11/2020, Patient is having benefit of pelvic floor physical therapy. Recommend continuation of this and will renew referral., - 07/31/2020 Postmenopausal atrophic vaginitis, Patient with evidence of atrophic vaginitis on physical exam today. I recommended vaginal estrogen cream as she has been irritated from the recurrent cyst. Patient denies a history of breast cancer or uterine cancer and was counseled on possible risks of vaginal estrogen cream as well as his safety. She will start this twice a week in the vagina. - 02/12/2021 Renal calculus, Patient continues to drink water and has not passed any stones in the last 6 months. She understands symptoms to look out for possible obstructing stones - 02/12/2021, - 09/03/2020, - 2017, Kidney stone on right side, - 2014 Acute vaginitis - 09/18/2020, Vaginitis, - 2017 Low back pain - 09/18/2020, - 09/03/2020, - 08/21/2020 Stress Incontinence - 09/18/2020, - 09/03/2020, Female stress incontinence, - 2014 Gross hematuria - 09/20/2019, - 09/10/2019 Hemorrhagic cystitis - 09/10/2019 Cystocele, Unspec, Vaginal wall prolapse - 2014      PMH Notes: Nephrolithasis: She has a left lower pole renal calculus. She underwent lithotripsy of a left ureteral calculus in 6/08.   SUI: This is mild and is associated with a cystocele. We elected to proceed with conservative therapy using physical therapy and if this was not  effective then consideration would be given to anterior repair and a mid urethral sling.     NON-GU PMH: Pyuria/other UA findings - 11/26/2021 Fecal smearing - 10/09/2020, - 09/03/2020, - 08/21/2020, - 08/11/2020, - 07/22/2020, - 07/08/2020, - 07/02/2020, - 06/16/2020, - 06/03/2020, - 05/28/2020, - 05/16/2020, - 04/30/2020, - 04/21/2020, - 04/14/2020, - 03/25/2020, - 03/11/2020, - 03/05/2020, - 02/20/2020 Low back pain, unspecified - 10/09/2020 Muscle weakness (generalized) - 10/09/2020, - 09/18/2020, - 09/03/2020, - 02/05/2020, Muscle weakness, - 2014 Other specified disorders of muscle - 10/09/2020, - 09/03/2020, - 02/05/2020 Fecal urgency - 09/18/2020, - 09/03/2020, - 08/21/2020, - 08/11/2020, - 07/22/2020, - 07/08/2020, - 07/02/2020, - 06/16/2020, - 06/03/2020, - 05/28/2020, - 05/16/2020, - 04/30/2020, - 04/21/2020, - 04/14/2020, - 03/25/2020, - 03/11/2020, - 03/05/2020, - 02/20/2020 Other lack of coordination - 09/18/2020, - 09/03/2020, Other lack of coordination, - 2014 Encounter for general adult medical examination without abnormal findings - 09/03/2020, Encounter for preventive health examination, - 2017 Other muscle spasm - 09/03/2020, - 02/05/2020 Personal history of malignant melanoma of skin, History of malignant melanoma of skin - 2017 Personal history of other diseases of the nervous system and sense organs, History of sleep apnea - 2017 Cardiac murmur, unspecified, Murmurs - 2014 Personal history of other diseases of the  circulatory system, History of hypertension - 2014 Personal history of other diseases of the digestive system, History of esophageal reflux - 2014 Personal history of other endocrine, nutritional and metabolic disease, History of diabetes mellitus - 2014, History of hypercholesterolemia, - 2014    FAMILY HISTORY: Family Health Status Number - Runs In Family No pertinent family history - Runs In Family Prostate Cancer - Runs In Family   SOCIAL HISTORY: Marital Status: Married Preferred Language: English; Ethnicity: Not  Hispanic Or Latino; Race: Black or African American Current Smoking Status: Patient has never smoked.  Social Drinker.  Drinks 1 caffeinated drink per day. Patient's occupation is/was retired.     Notes: Former smoker, Exercise Habits, Alcohol Use, Caffeine Use, Marital History - Currently Married, Occupation:, Tobacco Use   REVIEW OF SYSTEMS:    GU Review Female:   Patient reports frequent urination. Patient denies hard to postpone urination, burning /pain with urination, get up at night to urinate, leakage of urine, stream starts and stops, trouble starting your stream, have to strain to urinate, and being pregnant.  Gastrointestinal (Upper):   Patient denies nausea, vomiting, and indigestion/ heartburn.  Gastrointestinal (Lower):   Patient denies diarrhea and constipation.  Constitutional:   Patient reports fatigue. Patient denies fever, night sweats, and weight loss.  Skin:   Patient denies skin rash/ lesion and itching.  Eyes:   Patient denies blurred vision and double vision.  Musculoskeletal:   Patient denies back pain and joint pain.  Neurological:   Patient denies headaches and dizziness.  Psychologic:   Patient denies depression and anxiety.   VITAL SIGNS:      12/31/2021 10:51 AM  Weight 131 lb / 59.42 kg  Height 61.5 in / 156.21 cm  BP 150/77 mmHg  Pulse 86 /min  Temperature 100.2 F / 37.8 C  BMI 24.3 kg/m   MULTI-SYSTEM PHYSICAL EXAMINATION:    Constitutional: Well-nourished. No physical deformities. Normally developed. Good grooming.  Respiratory: No labored breathing, no use of accessory muscles.   Cardiovascular: Normal temperature, normal extremity pulses, no swelling, no varicosities.  Skin: No paleness, no jaundice, no cyanosis. No lesion, no ulcer, no rash.  Neurologic / Psychiatric: Oriented to time, oriented to place, oriented to person. No depression, no anxiety, no agitation.  Gastrointestinal: No mass, no tenderness, no rigidity, non obese abdomen.      Complexity of Data:  Source Of History:  Patient  Records Review:   Previous Doctor Records, Previous Patient Records  Urine Test Review:   Urinalysis  X-Ray Review: KUB: Reviewed Films. Reviewed Report. Discussed With Patient.  Renal Ultrasound (Limited): Reviewed Films.  C.T. Abdomen/Pelvis: Reviewed Films. Reviewed Report. Discussed With Patient.     12/31/21  Urinalysis  Urine Appearance Clear   Urine Color Yellow   Urine Glucose Neg mg/dL  Urine Bilirubin Neg mg/dL  Urine Ketones Neg mg/dL  Urine Specific Gravity 1.015   Urine Blood Trace ery/uL  Urine pH 5.5   Urine Protein Neg mg/dL  Urine Urobilinogen 0.2 mg/dL  Urine Nitrites Neg   Urine Leukocyte Esterase Neg leu/uL  Urine WBC/hpf 0 - 5/hpf   Urine RBC/hpf 0 - 2/hpf   Urine Epithelial Cells NS (Not Seen)   Urine Bacteria Rare (0-9/hpf)   Urine Mucous Present   Urine Yeast NS (Not Seen)   Urine Trichomonas Not Present   Urine Cystals NS (Not Seen)   Urine Casts NS (Not Seen)   Urine Sperm Not Present    PROCEDURES:  KUB - [id number]  A single view of the abdomen is obtained. Prominent overlying bowel gas pattern. There does appear to be a opacity within the right renal shadow in the lower pole. There is also an opacity on the right ureter next to the level of L4 that can be a potential calculi. Tracing down the ureter into the right renal pelvis there is an opacity noted near the expected anatomical course of the right UVJ.      Patient confirmed No Neulasta OnPro Device.            Renal Ultrasound - [id number]  Right Kidney: Length:9.5 cm Depth:3.9 cm Cortical Width:1.6 cm Width:5.3 cm  Left Kidney: Length: 10.3 cm Depth:4.3 cm Cortical Width: 1.7 cm Width: 4.7 cm  Left Kidney/Ureter:  Multiple stones, largest= 0.44cm LP  Right Kidney/Ureter:  Multiple stones,largest= 0.53cm LP  Bladder:  PVR= 32.03ML      Patient confirmed No Neulasta OnPro Device.           Urinalysis w/Scope - 81001 Dipstick  Dipstick Cont'd Micro  Color: Yellow Bilirubin: Neg WBC/hpf: 0 - 5/hpf  Appearance: Clear Ketones: Neg RBC/hpf: 0 - 2/hpf  Specific Gravity: 1.015 Blood: Trace Bacteria: Rare (0-9/hpf)  pH: 5.5 Protein: Neg Cystals: NS (Not Seen)  Glucose: Neg Urobilinogen: 0.2 Casts: NS (Not Seen)    Nitrites: Neg Trichomonas: Not Present    Leukocyte Esterase: Neg Mucous: Present      Epithelial Cells: NS (Not Seen)      Yeast: NS (Not Seen)      Sperm: Not Present    Notes:      ASSESSMENT:      ICD-10 Details  1 GU:   Renal calculus - N20.0 Chronic, Stable  2   Ureteral calculus - N20.1 Acute, Systemic Symptoms   PLAN:           Schedule Return Visit/Planned Activity: Next Available Appointment - Schedule Surgery          Document Letter(s):  Created for Patient: Clinical Summary         Notes:   Urinalysis without infectious parameters. Renal ultrasound is within normal limits. Her KUB does show concern for opacities within the right renal shadow as well as along the expected anatomical course of the right ureter. She is still symptomatic. She has not seen a stone pass. I advised that due to multiple stones a ureteroscopy would likely be the best choice of treatment. Urinalysis will be sent for precautionary culture today. Stone intervention was discussed in detail today. For ureteroscopy, the patient understands that there is a chance for a staged procedure. Patient also understands that there is risk for bleeding, infection, injury to surrounding organs, and general risks of anesthesia. The patient also understands the placement of a stent and the risks of stent placement including, risk for infection, the risk for pain, and the risk for injury. For ESWL, the patient understands that there is a chance of failure of procedure, there is also a risk for bruising, infection, bleeding, and injury to surrounding structures. The patient verbalized understanding to these risks.

## 2022-01-08 NOTE — Progress Notes (Signed)
Requested orders from Dr. Claudia Desanctis via Epic and left voicemail with [facility].

## 2022-01-08 NOTE — Progress Notes (Addendum)
COVID swab appointment: n/a  COVID Vaccine Completed: yes x2 Date COVID Vaccine completed: 01/16/20, 02/04/20 Has received booster: COVID vaccine manufacturer: Pfizer      Date of COVID positive in last 90 days: no  PCP - Marda Stalker, PA Cardiologist - Harwani  Chest x-ray - n/a EKG - 01/08/22 Epic/chart Stress Test - many years ago per pt ECHO - many years ago per pt Cardiac Cath - n/a Pacemaker/ICD device last checked: n/a Spinal Cord Stimulator: n/a  Sleep Study - yes, positive  CPAP - hasn't used in 1 year  Fasting Blood Sugar - 92-154 Checks Blood Sugar _1_ times a day  Blood Thinner Instructions: Aspirin Instructions: ASA 81, stop 7 days Last Dose:  Activity level: Can go up a flight of stairs and perform activities of daily living without stopping and without symptoms of chest pain or shortness of breath.     Anesthesia review: HTN, DM2, OSA  Patient denies shortness of breath, fever, cough and chest pain at PAT appointment   Patient verbalized understanding of instructions that were given to them at the PAT appointment. Patient was also instructed that they will need to review over the PAT instructions again at home before surgery.

## 2022-01-08 NOTE — Progress Notes (Addendum)
Please place orders for PAT appointment.  

## 2022-01-12 ENCOUNTER — Ambulatory Visit (HOSPITAL_COMMUNITY): Payer: Medicare Other

## 2022-01-12 ENCOUNTER — Encounter (HOSPITAL_COMMUNITY): Payer: Self-pay | Admitting: Urology

## 2022-01-12 ENCOUNTER — Ambulatory Visit (HOSPITAL_COMMUNITY): Payer: Medicare Other | Admitting: Anesthesiology

## 2022-01-12 ENCOUNTER — Ambulatory Visit (HOSPITAL_COMMUNITY): Payer: Medicare Other | Admitting: Physician Assistant

## 2022-01-12 ENCOUNTER — Encounter (HOSPITAL_COMMUNITY)
Admission: RE | Disposition: A | Discharge status: Home / Self Care | Payer: Self-pay | Source: Home / Self Care | Attending: Urology

## 2022-01-12 ENCOUNTER — Ambulatory Visit (HOSPITAL_COMMUNITY)
Admission: RE | Admit: 2022-01-12 | Discharge: 2022-01-12 | Disposition: A | Discharge status: Home / Self Care | Payer: Medicare Other | Attending: Urology | Admitting: Urology

## 2022-01-12 DIAGNOSIS — Z7984 Long term (current) use of oral hypoglycemic drugs: Secondary | ICD-10-CM | POA: Insufficient documentation

## 2022-01-12 DIAGNOSIS — E119 Type 2 diabetes mellitus without complications: Secondary | ICD-10-CM | POA: Insufficient documentation

## 2022-01-12 DIAGNOSIS — N201 Calculus of ureter: Secondary | ICD-10-CM | POA: Insufficient documentation

## 2022-01-12 DIAGNOSIS — E1122 Type 2 diabetes mellitus with diabetic chronic kidney disease: Secondary | ICD-10-CM | POA: Diagnosis not present

## 2022-01-12 DIAGNOSIS — N200 Calculus of kidney: Secondary | ICD-10-CM | POA: Diagnosis not present

## 2022-01-12 DIAGNOSIS — Z87442 Personal history of urinary calculi: Secondary | ICD-10-CM | POA: Insufficient documentation

## 2022-01-12 DIAGNOSIS — I129 Hypertensive chronic kidney disease with stage 1 through stage 4 chronic kidney disease, or unspecified chronic kidney disease: Secondary | ICD-10-CM | POA: Diagnosis not present

## 2022-01-12 DIAGNOSIS — N189 Chronic kidney disease, unspecified: Secondary | ICD-10-CM | POA: Diagnosis not present

## 2022-01-12 HISTORY — PX: CYSTOSCOPY/URETEROSCOPY/HOLMIUM LASER/STENT PLACEMENT: SHX6546

## 2022-01-12 LAB — GLUCOSE, CAPILLARY
Glucose-Capillary: 115 mg/dL — ABNORMAL HIGH (ref 70–99)
Glucose-Capillary: 107 mg/dL — ABNORMAL HIGH (ref 70–99)

## 2022-01-12 SURGERY — CYSTOSCOPY/URETEROSCOPY/HOLMIUM LASER/STENT PLACEMENT
Anesthesia: General | Laterality: Right

## 2022-01-12 MED ORDER — CEFAZOLIN SODIUM-DEXTROSE 2-4 GM/100ML-% IV SOLN
2.0000 g | Freq: Once | INTRAVENOUS | Status: AC
  Administered 2022-01-12: 2 g via INTRAVENOUS
  Filled 2022-01-12: qty 100

## 2022-01-12 MED ORDER — OXYCODONE HCL 5 MG PO TABS: 5.0000 mg | ORAL_TABLET | Freq: Once | ORAL | Status: DC | PRN

## 2022-01-12 MED ORDER — FENTANYL CITRATE PF 50 MCG/ML IJ SOSY: 25.0000 ug | PREFILLED_SYRINGE | INTRAMUSCULAR | Status: DC | PRN

## 2022-01-12 MED ORDER — CHLORHEXIDINE GLUCONATE 0.12 % MT SOLN
15.0000 mL | Freq: Once | OROMUCOSAL | Status: AC
  Administered 2022-01-12: 13:00:00 15 mL via OROMUCOSAL

## 2022-01-12 MED ORDER — ONDANSETRON HCL 4 MG/2ML IJ SOLN
INTRAMUSCULAR | Status: DC | PRN
Start: 2022-01-12 — End: 2022-01-12
  Administered 2022-01-12: 4 mg via INTRAVENOUS

## 2022-01-12 MED ORDER — ACETAMINOPHEN 500 MG PO TABS: 1000.0000 mg | ORAL_TABLET | Freq: Once | ORAL | Status: DC

## 2022-01-12 MED ORDER — DEXAMETHASONE SODIUM PHOSPHATE 10 MG/ML IJ SOLN
INTRAMUSCULAR | Status: DC | PRN
  Administered 2022-01-12: 4 mg via INTRAVENOUS

## 2022-01-12 MED ORDER — EPHEDRINE SULFATE-NACL 50-0.9 MG/10ML-% IV SOSY
PREFILLED_SYRINGE | INTRAVENOUS | Status: DC | PRN
  Administered 2022-01-12: 5 mg via INTRAVENOUS

## 2022-01-12 MED ORDER — IOHEXOL 300 MG/ML  SOLN
INTRAMUSCULAR | Status: DC | PRN
  Administered 2022-01-12: 10 mL

## 2022-01-12 MED ORDER — AMISULPRIDE (ANTIEMETIC) 5 MG/2ML IV SOLN: 10.0000 mg | Freq: Once | INTRAVENOUS | Status: DC | PRN

## 2022-01-12 MED ORDER — PROPOFOL 10 MG/ML IV BOLUS
INTRAVENOUS | Status: DC | PRN
Start: 2022-01-12 — End: 2022-01-12
  Administered 2022-01-12: 80 mg via INTRAVENOUS
  Administered 2022-01-12: 120 mg via INTRAVENOUS

## 2022-01-12 MED ORDER — OXYCODONE HCL 5 MG/5ML PO SOLN: 5.0000 mg | Freq: Once | ORAL | Status: DC | PRN

## 2022-01-12 MED ORDER — SODIUM CHLORIDE 0.9 % IR SOLN
Status: DC | PRN
  Administered 2022-01-12: 3000 mL

## 2022-01-12 MED ORDER — PHENYLEPHRINE 40 MCG/ML (10ML) SYRINGE FOR IV PUSH (FOR BLOOD PRESSURE SUPPORT)
PREFILLED_SYRINGE | INTRAVENOUS | Status: DC | PRN
  Administered 2022-01-12: 120 ug via INTRAVENOUS
  Administered 2022-01-12: 80 ug via INTRAVENOUS
  Administered 2022-01-12: 120 ug via INTRAVENOUS
  Administered 2022-01-12: 80 ug via INTRAVENOUS

## 2022-01-12 MED ORDER — ONDANSETRON HCL 4 MG/2ML IJ SOLN: 4.0000 mg | Freq: Once | INTRAMUSCULAR | Status: DC | PRN

## 2022-01-12 MED ORDER — ORAL CARE MOUTH RINSE: 15.0000 mL | Freq: Once | OROMUCOSAL | Status: AC

## 2022-01-12 MED ORDER — LIDOCAINE 2% (20 MG/ML) 5 ML SYRINGE
INTRAMUSCULAR | Status: DC | PRN
  Administered 2022-01-12: 80 mg via INTRAVENOUS

## 2022-01-12 MED ORDER — LACTATED RINGERS IV SOLN: INTRAVENOUS | Status: DC

## 2022-01-12 SURGICAL SUPPLY — 19 items
BAG URO CATCHER STRL LF (MISCELLANEOUS) ×3 IMPLANT
BASKET ZERO TIP NITINOL 2.4FR (BASKET) ×1 IMPLANT
BSKT STON RTRVL ZERO TP 2.4FR (BASKET) ×1
CATH URETL OPEN 5X70 (CATHETERS) ×3 IMPLANT
CLOTH BEACON ORANGE TIMEOUT ST (SAFETY) ×3 IMPLANT
DRSG TEGADERM 2-3/8X2-3/4 SM (GAUZE/BANDAGES/DRESSINGS) ×1 IMPLANT
EXTRACTOR STONE 1.7FRX115CM (UROLOGICAL SUPPLIES) IMPLANT
FIBER LASER TRACTIP 200 (UROLOGICAL SUPPLIES) ×1 IMPLANT
GLOVE SURG ENC MOIS LTX SZ6.5 (GLOVE) ×3 IMPLANT
GOWN STRL REUS W/TWL LRG LVL3 (GOWN DISPOSABLE) ×3 IMPLANT
GUIDEWIRE STR DUAL SENSOR (WIRE) ×4 IMPLANT
KIT TURNOVER KIT A (KITS) IMPLANT
MANIFOLD NEPTUNE II (INSTRUMENTS) ×3 IMPLANT
PACK CYSTO (CUSTOM PROCEDURE TRAY) ×3 IMPLANT
SHEATH NAVIGATOR HD 11/13X28 (SHEATH) ×1 IMPLANT
SHEATH NAVIGATOR HD 11/13X36 (SHEATH) IMPLANT
STENT URET 6FRX22 CONTOUR (STENTS) ×1 IMPLANT
TUBING CONNECTING 10 (TUBING) ×3 IMPLANT
TUBING UROLOGY SET (TUBING) ×3 IMPLANT

## 2022-01-12 NOTE — Interval H&P Note (Signed)
History and Physical Interval Note:  01/12/2022 3:34 PM  Kristen Mahoney  has presented today for surgery, with the diagnosis of RIGHT RENAL STONES, RIGHT URETERAL STONES.  The various methods of treatment have been discussed with the patient and family. After consideration of risks, benefits and other options for treatment, the patient has consented to  Procedure(s): CYSTOSCOPY/RETROGRADE/ URETEROSCOPY/HOLMIUM LASER/STENT PLACEMENT (Right) as a surgical intervention.  The patient's history has been reviewed, patient examined, no change in status, stable for surgery.  I have reviewed the patient's chart and labs.  Questions were answered to the patient's satisfaction.     Riyansh Gerstner D Conal [place]

## 2022-01-12 NOTE — Op Note (Signed)
Preoperative diagnosis: right ureteral calculi  Postoperative diagnosis: right renal calculi  Procedure:  Cystoscopy right ureteroscopy, laser lithotripsy, basket stone extraction right 62F x 22 ureteral stent placement - with tether  right retrograde pyelography with interpretation  Surgeon: Jacalyn Lefevre, MD  Anesthesia: General  Complications: None  Intraoperative findings:  Normal urethra Bilateral orthotropic ureteral orifices right retrograde pyelography did not clearly demonstrate filling defect or hydronephrosis Bladder mucosa normal without masses   EBL: Minimal  Specimens: right renal calculus  Disposition of specimens: [facility] for stone analysis  Indication: Kristen Mahoney is a 76 y.o.   patient with thee  right ureteral stones and associated right symptoms. After reviewing the management options for treatment, the patient elected to proceed with the above surgical procedure(s). We have discussed the potential benefits and risks of the procedure, side effects of the proposed treatment, the likelihood of the patient achieving the goals of the procedure, and any potential problems that might occur during the procedure or recuperation. Informed consent has been obtained.   Description of procedure:  The patient was taken to the operating room and general anesthesia was induced.  The patient was placed in the dorsal lithotomy position, prepped and draped in the usual sterile fashion, and preoperative antibiotics were administered. A preoperative time-out was performed.   Cystourethroscopy was performed.  The patients urethra was examined and was normal. The bladder was then systematically examined in its entirety. There was no evidence for any bladder tumors, stones, or other mucosal pathology.    Attention then turned to the right ureteral orifice and a ureteral catheter was used to intubate the ureteral orifice.  Omnipaque contrast was injected  through the ureteral catheter and a retrograde pyelogram was performed with findings as dictated above.  A 0.38 sensor guidewire was then advanced up the right ureter into the renal pelvis under fluoroscopic guidance. The 4.5 Fr semirigid ureteroscope was then advanced into the ureter next to the guidewire to the level of the UPJ.  No stones were visualized.   Next a second sensor wire was placed through the ureteroscope and the ureteroscope was removed.  Ureteral access sheath was then placed over the second wire with fluoroscopic guidance.  The inner sheath and wire were removed.  Flexible ureteroscopy then took place which encountered 2 stones in the kidney.  There was 1 stone seen in the mid pole and 1 stone seen in the lower pole.   The stones were then dusted with the 242 micron holmium laser fiber.  The largest stone fragments were then removed with a 0 tip basket.  Reinspection of the kidney revealed no fragments larger than 1 mm in size.  Reinspection of the ureter revealed no remaining visible stones or fragments.  The ureteroscope was removed and unison with the access sheath taking care to examine the ureter on the way out.  No trauma was noted.  The ueteral stent was advance dover the wire using Seldinger technique.  The stent was positioned appropriately under fluoroscopic and cystoscopic guidance.  The wire was then removed with an adequate stent curl noted in the renal pelvis as well as in the bladder.  The bladder was then emptied and the procedure ended.  The patient appeared to tolerate the procedure well and without complications.  The patient was able to be awakened and transferred to the recovery unit in satisfactory condition.   Disposition: The tether of the stent was left on and  tucked inside the patient's vagina.  Instructions for removing the stent have been provided to the patient.

## 2022-01-12 NOTE — Anesthesia Preprocedure Evaluation (Signed)
Anesthesia Evaluation  Patient identified by MRN, date of birth, ID band Patient awake    Reviewed: Allergy & Precautions, NPO status , Patient's Chart, lab work & pertinent test results, reviewed documented beta blocker date and time   History of Anesthesia Complications Negative for: history of anesthetic complications  Airway Mallampati: II  TM Distance: >3 FB Neck ROM: Full    Dental  (+) Missing, Dental Advisory Given   Pulmonary sleep apnea and Continuous Positive Airway Pressure Ventilation , former smoker,    Pulmonary exam normal        Cardiovascular hypertension, Pt. on medications and Pt. on home beta blockers Normal cardiovascular exam     Neuro/Psych Anxiety Depression negative neurological ROS     GI/Hepatic Neg liver ROS, hiatal hernia, GERD  ,  Endo/Other  diabetes, Oral Hypoglycemic Agents  Renal/GU CRFRenal disease (RIGHT RENAL STONES, RIGHT URETERAL STONES)  negative genitourinary   Musculoskeletal negative musculoskeletal ROS (+)   Abdominal   Peds  Hematology negative hematology ROS (+)   Anesthesia Other Findings   Reproductive/Obstetrics                            Anesthesia Physical Anesthesia Plan  ASA: 3  Anesthesia Plan: General   Post-op Pain Management: Minimal or no pain anticipated   Induction: Intravenous  PONV Risk Score and Plan: 3 and Ondansetron, Dexamethasone and Treatment may vary due to age or medical condition  Airway Management Planned: LMA  Additional Equipment: None  Intra-op Plan:   Post-operative Plan: Extubation in OR  Informed Consent: I have reviewed the patients History and Physical, chart, labs and discussed the procedure including the risks, benefits and alternatives for the proposed anesthesia with the patient or authorized representative who has indicated his/her understanding and acceptance.     Dental advisory  given  Plan Discussed with:   Anesthesia Plan Comments:         Anesthesia Quick Evaluation

## 2022-01-12 NOTE — Anesthesia Postprocedure Evaluation (Signed)
Anesthesia Post Note  Patient: Kristen Mahoney  Procedure(s) Performed: CYSTOSCOPY/RETROGRADE/ URETEROSCOPY/HOLMIUM LASER/STENT PLACEMENT (Right)     Patient location during evaluation: PACU Anesthesia Type: General Level of consciousness: awake and alert Pain management: pain level controlled Vital Signs Assessment: post-procedure vital signs reviewed and stable Respiratory status: spontaneous breathing, nonlabored ventilation and respiratory function stable Cardiovascular status: blood pressure returned to baseline and stable Postop Assessment: no apparent nausea or vomiting Anesthetic complications: no   No notable events documented.  Last Vitals:  Vitals:   01/12/22 1715 01/12/22 1730  BP: (!) 165/72 (!) 167/63  Pulse: 85 86  Resp: 10 (!) 23  Temp:    SpO2: 97% 98%    Last Pain:  Vitals:   01/12/22 1730  TempSrc:   PainSc: 0-No pain                 Lidia Collum

## 2022-01-12 NOTE — Transfer of Care (Signed)
Immediate Anesthesia Transfer of Care Note  Patient: Kristen Mahoney  Procedure(s) Performed: CYSTOSCOPY/RETROGRADE/ URETEROSCOPY/HOLMIUM LASER/STENT PLACEMENT (Right)  Patient Location: PACU  Anesthesia Type:General  Level of Consciousness: awake  Airway & Oxygen Therapy: Patient Spontanous Breathing  Post-op Assessment: Report given to RN and Post -op Vital signs reviewed and stable  Post vital signs: Reviewed and stable  Last Vitals:  Vitals Value Taken Time  BP 153/72 01/12/22 1702  Temp 36.8 C 01/12/22 1702  Pulse 90 01/12/22 1713  Resp 24 01/12/22 1713  SpO2 97 % 01/12/22 1713  Vitals shown include unvalidated device data.  Last Pain:  Vitals:   01/12/22 1702  TempSrc:   PainSc: 0-No pain      Patients Stated Pain Goal: 3 (00/05/05 6788)  Complications: No notable events documented.

## 2022-01-12 NOTE — Anesthesia Procedure Notes (Signed)
Procedure Name: LMA Insertion Date/Time: 01/12/2022 4:09 PM Performed by: Milford Cage, CRNA Pre-anesthesia Checklist: Patient identified, Emergency Drugs available, Suction available and Patient being monitored Patient Re-evaluated:Patient Re-evaluated prior to induction Oxygen Delivery Method: Circle system utilized Preoxygenation: Pre-oxygenation with 100% oxygen Induction Type: IV induction Ventilation: Mask ventilation without difficulty LMA: LMA inserted LMA Size: 3.0 Number of attempts: 1 Tube secured with: Tape Dental Injury: Teeth and Oropharynx as per pre-operative assessment

## 2022-01-12 NOTE — Discharge Instructions (Signed)
DISCHARGE INSTRUCTIONS FOR KIDNEY STONE/URETERAL STENT   MEDICATIONS:  1. Resume all your other meds from home  2. AZO over the counter can help with the burning/stinging when you urinate. 3. Tramadol is for moderate/severe pain, otherwise taking up to 1000 mg every 6 hours of plainTylenol will help treat your pain.   4. Take _____ one hour prior to removal of your stent.    ACTIVITY:  1. No strenuous activity x 1week  2. No driving while on narcotic pain medications  3. Drink plenty of water  4. Continue to walk at home - you can still get blood clots when you are at home, so keep active, but don't over do it.  5. May return to work/school tomorrow or when you feel ready   BATHING:  1. You can shower and we recommend daily showers  2. You have a string coming from your urethra: The stent string is attached to your ureteral stent. Do not pull on this.   SIGNS/SYMPTOMS TO CALL:  Please call us if you have a fever greater than 101.5, uncontrolled nausea/vomiting, uncontrolled pain, dizziness, unable to urinate, bloody urine, chest pain, shortness of breath, leg swelling, leg pain, redness around wound, drainage from wound, or any other concerns or questions.   You can reach [place] at [phone].   FOLLOW-UP:  1. You have a string attached to your stent, you may remove it on _________. To do this, pull the stringsuntil the stent is completely removed. You may feel an odd sensation in your back.

## 2022-01-13 ENCOUNTER — Encounter (HOSPITAL_COMMUNITY): Payer: Self-pay | Admitting: Urology

## 2022-01-19 DIAGNOSIS — N200 Calculus of kidney: Secondary | ICD-10-CM | POA: Diagnosis not present

## 2022-01-27 DIAGNOSIS — N200 Calculus of kidney: Secondary | ICD-10-CM | POA: Diagnosis not present

## 2022-01-27 DIAGNOSIS — N838 Other noninflammatory disorders of ovary, fallopian tube and broad ligament: Secondary | ICD-10-CM | POA: Diagnosis not present

## 2022-01-27 DIAGNOSIS — R102 Pelvic and perineal pain: Secondary | ICD-10-CM | POA: Diagnosis not present

## 2022-01-27 DIAGNOSIS — N201 Calculus of ureter: Secondary | ICD-10-CM | POA: Diagnosis not present

## 2022-01-27 DIAGNOSIS — M545 Low back pain, unspecified: Secondary | ICD-10-CM | POA: Diagnosis not present

## 2022-01-27 DIAGNOSIS — N281 Cyst of kidney, acquired: Secondary | ICD-10-CM | POA: Diagnosis not present

## 2022-01-27 DIAGNOSIS — R8271 Bacteriuria: Secondary | ICD-10-CM | POA: Diagnosis not present

## 2022-02-05 ENCOUNTER — Telehealth: Payer: Self-pay | Admitting: Neurology

## 2022-02-05 NOTE — Telephone Encounter (Signed)
Pt has called to report that on various places on her body there are markings that resemble the cord to her CPAP.  Pt has noticed over night and different markings for about 2 weeks.  Pt put alcohol and vaseline on spots and it burns.  Pt asking for a call to discuss,

## 2022-02-08 DIAGNOSIS — E1169 Type 2 diabetes mellitus with other specified complication: Secondary | ICD-10-CM | POA: Diagnosis not present

## 2022-02-08 DIAGNOSIS — I10 Essential (primary) hypertension: Secondary | ICD-10-CM | POA: Diagnosis not present

## 2022-02-08 DIAGNOSIS — N200 Calculus of kidney: Secondary | ICD-10-CM | POA: Diagnosis not present

## 2022-02-08 DIAGNOSIS — G4733 Obstructive sleep apnea (adult) (pediatric): Secondary | ICD-10-CM | POA: Diagnosis not present

## 2022-02-08 NOTE — Telephone Encounter (Signed)
Called pt to further discuss. She recently has had kidney stone procedure. She has little scratches all over her body. Advised it is unlikely CPAP cords. Recommended she f/u with PCP or dermatologist for further evaluation/treatment.  I also reminded her about HST that was ordered at that visit. Aware I will send sleep lab message to get her scheduled for this. We will call w/ results once back and set up f/u at that time. She verbalized understanding.

## 2022-02-10 DIAGNOSIS — L853 Xerosis cutis: Secondary | ICD-10-CM | POA: Diagnosis not present

## 2022-02-10 DIAGNOSIS — Z85828 Personal history of other malignant neoplasm of skin: Secondary | ICD-10-CM | POA: Diagnosis not present

## 2022-02-10 DIAGNOSIS — L811 Chloasma: Secondary | ICD-10-CM | POA: Diagnosis not present

## 2022-02-11 ENCOUNTER — Ambulatory Visit (INDEPENDENT_AMBULATORY_CARE_PROVIDER_SITE_OTHER): Payer: Medicare Other | Admitting: Nurse Practitioner

## 2022-02-11 ENCOUNTER — Telehealth: Payer: Self-pay | Admitting: Gastroenterology

## 2022-02-11 ENCOUNTER — Encounter: Payer: Self-pay | Admitting: Nurse Practitioner

## 2022-02-11 ENCOUNTER — Other Ambulatory Visit: Payer: Self-pay

## 2022-02-11 VITALS — BP 124/74

## 2022-02-11 DIAGNOSIS — R350 Frequency of micturition: Secondary | ICD-10-CM

## 2022-02-11 DIAGNOSIS — R1032 Left lower quadrant pain: Secondary | ICD-10-CM

## 2022-02-11 NOTE — Telephone Encounter (Signed)
No answer. Left message of my call on her voicemail.

## 2022-02-11 NOTE — Telephone Encounter (Signed)
Called the patient again. No answer. Left message of my call on her voicemail.

## 2022-02-11 NOTE — Telephone Encounter (Signed)
Patient called requesting to speak with a nurse regarding abdominal pain due to Diverticulitis.

## 2022-02-11 NOTE — Progress Notes (Signed)
° °  Acute Office Visit  Subjective:    Patient ID: Kristen Mahoney, female    DOB: 1946/07/18, 76 y.o.   MRN: [id number]   HPI 76 y.o. presents today for LLQ pain and urinary frequency. 01/12/2022 had right renal stent place d/t stones. She reports abdominal discomfort since then. She saw urology since with no abnormal findings. She does have history of diverticulitis and feels the pain is similar. She had very small amount of blood in stool a couple of days ago. Bowels have been somewhat off - hard stool this morning then diarrhea later. Denies vaginal symptoms.    Review of Systems  Constitutional: Negative.   Gastrointestinal:  Positive for abdominal pain, blood in stool, constipation and diarrhea. Negative for nausea, rectal pain and vomiting.  Genitourinary:  Positive for frequency. Negative for difficulty urinating, dysuria, flank pain, hematuria, urgency, vaginal discharge and vaginal pain.      Objective:    Physical Exam Constitutional:      Appearance: Normal appearance.  Abdominal:     Tenderness: There is abdominal tenderness in the left lower quadrant. There is no right CVA tenderness, left CVA tenderness, guarding or rebound.  Genitourinary:    General: Normal vulva.     Adnexa: Right adnexa normal and left adnexa normal.     Rectum: No mass, tenderness or external hemorrhoid.    BP 124/74  Wt Readings from Last 3 Encounters:  01/12/22 130 lb (59 kg)  01/08/22 130 lb (59 kg)  12/10/21 133 lb (60.3 kg)   UA negative      Patient informed chaperone available to be present for breast and pelvic exam. Patient has requested no chaperone to be present. Patient has been advised what will be completed during breast and pelvic exam.   Assessment & Plan:   Problem List Items Addressed This Visit   None Visit Diagnoses     Left lower quadrant abdominal pain    -  Primary   Frequent urination       Relevant Orders   Urinalysis,Complete w/RFL Culture      Plan:  Normal urinalysis and pelvic exam. Recommend following up with GI and consuming liquids or bland diet to see if symptoms improve. She is agreeable to plan.      Tamela Gammon DNP, 2:31 PM 02/11/2022

## 2022-02-12 ENCOUNTER — Other Ambulatory Visit: Payer: Self-pay

## 2022-02-12 MED ORDER — AMOXICILLIN-POT CLAVULANATE 875-125 MG PO TABS: 1.0000 | ORAL_TABLET | Freq: Two times a day (BID) | ORAL | 0 refills | Status: AC

## 2022-02-12 NOTE — Telephone Encounter (Signed)
Doctor of the Day  77 year old patient of Dr Silverio Decamp with a history of left side diverticular disease. She was last treated for diverticulitis 11/27/2021. She calls with complaints of LLQ pain for 2 days and worsening. She was seen by her GYN yesterday for the same. She reports diarrhea yesterday and now feels constipated. She is passing "pebbles. Pain is described as "sharp" and then "pressure." She is straining for her bowel movement today and reports "specks of blood on the tissue."  She is allergic to Cipro.   Colonoscopy 10/12/2017 by Dr. Silverio Decamp: Decreased sphincter tone found on digital rectal exam. - Diverticulosis in the sigmoid colon and in the descending colon. - The examination was otherwise normal. - No specimens collected. -Repeat colonoscopy in 5 years for screening purposes.   Please advise.

## 2022-02-12 NOTE — Telephone Encounter (Signed)
Spoke with the patient. Discussed daily Miralax use and suggested she start with one dose daily titrated to having soft bowel movements. Soft, low residue diet for now. Follow up appointment 02/22/22. Rx to [facility].

## 2022-02-13 LAB — URINE CULTURE
MICRO NUMBER:: 13018332
Result:: NO GROWTH
SPECIMEN QUALITY:: ADEQUATE

## 2022-02-13 LAB — URINALYSIS, COMPLETE W/RFL CULTURE
Bilirubin Urine: NEGATIVE
Glucose, UA: NEGATIVE
Hgb urine dipstick: NEGATIVE
Hyaline Cast: NONE SEEN /LPF
Ketones, ur: NEGATIVE
Nitrites, Initial: NEGATIVE
Protein, ur: NEGATIVE
RBC / HPF: NONE SEEN /HPF (ref 0–2)
Specific Gravity, Urine: 1.02 (ref 1.001–1.035)
pH: 5.5 (ref 5.0–8.0)

## 2022-02-13 LAB — CULTURE INDICATED

## 2022-02-20 NOTE — Progress Notes (Signed)
02/20/2022 Kristen Mahoney [id number] 1946/03/30   Chief Complaint: Follow up LLQ pain  History of Present Illness: Kristen Mahoney is a 76 year old female with a past medical history of anxiety, arthritis, hypertension, diabetes mellitus type 2, OSA on CPAP, kidney stones, GERD and LLQ pain treated for suspected diverticulitis early Dec 2022. Past cholecystectomy. S/P hysterectomy complicated with vaginal fistula with repair in 2010, incompetent anal sphincter with partial rectal prolapse s/p anorectal band ligation by Dr. Marcello Moores. She is followed by Dr. Silverio Decamp. She presents to our office today for follow up regarding lower abdominal pain, possible diverticulitis.   She developed LLQ pain early 11/2021. She was started on Augmentin 875mg  po bid x 7 days on 11/27/2021  for suspected diverticulitis and a CTAP with contrast was completed on 12/09/2021 which did not show any evidence of diverticulitis or colitis but showed a stable 2.5 cm right adnexal cyst.  She was seen in office by Dr. Silverio Decamp on 12/10/2021, at that time her LLQ pain was gone following a course of Augmentin. She was advised to continue Benefiber 1 tablespoon 3 times daily and MiraLAX nightly to avoid constipation and to use Levsin twice daily as needed for abdominal pain.  Recall colonoscopy due October 2023.  She developed right and left lower quadrant abdominal pain 02/09/2022 which somewhat increased when she urinated.  She saw her GYN and a urinalysis was negative and she was advised to follow-up in our office for further evaluation regarding her persistent lower abdominal pain.  She stated her lower abdominal pain was different with this episode as it affected her entire lower abdomen and not just her left lower area.  Her lower abdominal pain comes and goes described as a soreness type pain.  She called our office on 02/11/2022 and she was prescribed Augmentin 875 mg 1 p.o. twice daily for 7 days with instructions to take MiraLAX  and to maintain a low residue diet.  Her lower abdominal pain somewhat improved following the 7-day course of Augmentin but she continues to have lower abdominal soreness.  She developed nausea while on the Augmentin which resolved.  She took MiraLAX while she was still on Augmentin which resulted in diarrhea so she stopped taking it she is now passing small pebble-like stools daily, no BM yet today.  No rectal bleeding or black stools.  Father and 2 brothers with history of colon cancer.  Sister age 36 with a history of diverticular bleed.  Colonoscopy 10/12/2017: - Decreased sphincter tone found on digital rectal exam. - Diverticulosis in the sigmoid colon and in the descending colon. - The examination was otherwise normal. - No specimens collected. - 5 year  colonoscopy recall  CBC Latest Ref Rng & Units 12/22/2021 04/15/2020 12/04/2019  WBC 4.0 - 10.5 K/uL 13.8(H) 6.1 -  Hemoglobin 12.0 - 15.0 g/dL 13.9 13.0 12.4  Hematocrit 36.0 - 46.0 % 42.0 39.4 37.6  Platelets 150 - 400 K/uL 184 192.0 -    CMP Latest Ref Rng & Units 12/22/2021 12/09/2021 04/15/2020  Glucose 70 - 99 mg/dL 127(H) - 109(H)  BUN 8 - 23 mg/dL 20 - 16  Creatinine 0.44 - 1.00 mg/dL 1.12(H) 0.90 0.81  Sodium 135 - 145 mmol/L 144 - 145  Potassium 3.5 - 5.1 mmol/L 3.4(L) - 4.4  Chloride 98 - 111 mmol/L 112(H) - 107  CO2 22 - 32 mmol/L 22 - 31  Calcium 8.9 - 10.3 mg/dL 9.1 - 9.3  Total Protein 6.0 -  8.3 g/dL - - 6.8  Total Bilirubin 0.2 - 1.2 mg/dL - - 0.4  Alkaline Phos 39 - 117 U/L - - 54  AST 0 - 37 U/L - - 12  ALT 0 - 35 U/L - - 8    CTAP with contrast 12/09/2021:  TECHNIQUE: Multidetector CT imaging of the abdomen and pelvis was performed using the standard protocol following bolus administration of intravenous contrast.   CONTRAST:  82mL OMNIPAQUE IOHEXOL 300 MG/ML  SOLN   COMPARISON:  Noncontrast CT on 09/07/2019   FINDINGS: Lower Chest: No acute findings.   Hepatobiliary: No hepatic masses identified.  Prior cholecystectomy. No evidence of biliary obstruction.   Pancreas:  No mass or inflammatory changes.   Spleen: Within normal limits in size and appearance.   Adrenals/Urinary Tract: No masses identified. Mild bilateral renal parenchymal scarring again noted. Multiple small calculi are seen within the right kidney, largest in the right renal pelvis measuring 6 mm. No evidence of ureteral calculi or hydronephrosis.   Stomach/Bowel: No evidence of obstruction, inflammatory process or abnormal fluid collections. Normal appendix visualized.   Vascular/Lymphatic: No pathologically enlarged lymph nodes. No acute vascular findings. Aortic atherosclerotic calcification noted.   Reproductive: Prior hysterectomy again noted. 2.5 cm benign-appearing right adnexal cyst remains stable, consistent with benign etiology.    Musculoskeletal:  No suspicious bone lesions identified.   IMPRESSION: No acute findings within the abdomen or pelvis.   Right nephrolithiasis. No evidence of ureteral calculi or hydronephrosis.   Stable 2.5 cm benign-appearing right adnexal cyst. No follow-up imaging recommended. Note: This recommendation does not apply to premenarchal patients and to those with increased risk (genetic, family history, elevated tumor markers or other high-risk factors) of ovarian cancer.    Aortic Atherosclerosis   Past Medical History:  Diagnosis Date   Allergy    seasonal   Anxiety    Arthritis    knees,all over   Basal cell carcinoma    Benign breast cyst in female    PATIENT HAS HISTORY OF BREAST CYSTS   Cataract    beginning bilateral   Diabetes mellitus    GERD (gastroesophageal reflux disease)    Headache    Herpes progenitalis    Hyperlipidemia    Hypertension    Kidney stone    OSA on CPAP    Sleep apnea    cpap   Urinary, incontinence, stress female    Vertigo    Past Surgical History:  Procedure Laterality Date   ABDOMINAL HYSTERECTOMY  1986    leiomyomata   Basal cell excised     breast cystectomy     right   CHOLECYSTECTOMY  2003   COLONOSCOPY     CYSTOSCOPY/URETEROSCOPY/HOLMIUM LASER/STENT PLACEMENT Right 01/12/2022   Procedure: CYSTOSCOPY/RETROGRADE/ URETEROSCOPY/HOLMIUM LASER/STENT PLACEMENT;  Surgeon: Robley Fries, MD;  Location: [facility];  Service: Urology;  Laterality: Right;   ear cystectomy     ESOPHAGOGASTRODUODENOSCOPY     KNEE ARTHROSCOPY     right   LITHOTRIPSY     RECTOVAGINAL FISTULA CLOSURE     WISDOM TOOTH EXTRACTION     Current Medications, Allergies, Past Medical History, Past Surgical History, Family History and Social History were reviewed in [employer] record.  Review of Systems:   Constitutional: Negative for fever, sweats, chills or weight loss.  Respiratory: Negative for shortness of breath.   Cardiovascular: Negative for chest pain, palpitations and leg swelling.  Gastrointestinal: See HPI.  Musculoskeletal: Negative for back pain or muscle  aches.  Neurological: Negative for dizziness, headaches or paresthesias.   Physical Exam: BP (!) 170/60 (BP Location: Left Arm, Patient Position: Sitting, Cuff Size: Normal)    Pulse 76 Comment: irregular   Ht 5' 0.25" (1.53 m) Comment: height measured without shoes   Wt 136 lb (61.7 kg)    BMI 26.34 kg/m   General: 76 year old female in no acute distress. Head: Normocephalic and atraumatic. Eyes: No scleral icterus. Conjunctiva pink . Ears: Normal auditory acuity. Mouth: Dentition intact. No ulcers or lesions.  Lungs: Clear throughout to auscultation. Heart: Regular rate and rhythm, no murmur. Abdomen: Soft with gaseous distention.  Mild RLQ and LLQ tenderness without rebound or guarding.  No masses or hepatomegaly. Normal bowel sounds x 4 quadrants.  Rectal: Deferred. Musculoskeletal: Symmetrical with no gross deformities. Extremities: No edema. Neurological: Alert oriented x 4. No focal deficits.  Psychological: Alert and  cooperative. Normal mood and affect  Assessment and Recommendations:  44) 76 year old female with LLQ pain treated with antibiotics for suspected diverticulitis 11/27/2021, CTAP 12/08/2021 (done following antibiotic therapy) without evidence of diverticulitis. RLQ and LLQ pain started 02/09/2022, prescribed Augmentin 875mg  po bid x 7 days on 02/11/2022 with slight improvement. -CBC, CMP -Colonoscopy in 8 to 10 weeks, to schedule after labs and abd xray results reviewed -Repeat CTAP with contrast if lower abdominal pain worsens -IBgard 1 p.o. twice daily -Patient to go to the emergency room if she develops severe abdominal pain  2) Constipation.  Abdomen with mild gaseous distention on exam. -KUB to rule out constipation/stool-filled colon -1/2 dose Miralax Q HS as tolerated  3) Colon cancer screening. Father diagnosed 42 with history of colon cancer. Brother  x 2 colon cancer.   4) 2.5 cm right adnexal cyst, stable per CT imaging  -Follow up with gyn

## 2022-02-22 ENCOUNTER — Ambulatory Visit (INDEPENDENT_AMBULATORY_CARE_PROVIDER_SITE_OTHER): Payer: Medicare Other | Admitting: Nurse Practitioner

## 2022-02-22 ENCOUNTER — Encounter: Payer: Self-pay | Admitting: Nurse Practitioner

## 2022-02-22 ENCOUNTER — Other Ambulatory Visit (INDEPENDENT_AMBULATORY_CARE_PROVIDER_SITE_OTHER): Payer: Medicare Other

## 2022-02-22 VITALS — BP 170/60 | HR 76 | Ht 60.25 in | Wt 136.0 lb

## 2022-02-22 DIAGNOSIS — K5730 Diverticulosis of large intestine without perforation or abscess without bleeding: Secondary | ICD-10-CM | POA: Diagnosis not present

## 2022-02-22 DIAGNOSIS — R1030 Lower abdominal pain, unspecified: Secondary | ICD-10-CM

## 2022-02-22 DIAGNOSIS — K5900 Constipation, unspecified: Secondary | ICD-10-CM

## 2022-02-22 LAB — COMPREHENSIVE METABOLIC PANEL
ALT: 9 U/L (ref 0–35)
AST: 16 U/L (ref 0–37)
Albumin: 4.2 g/dL (ref 3.5–5.2)
Alkaline Phosphatase: 58 U/L (ref 39–117)
BUN: 21 mg/dL (ref 6–23)
CO2: 30 mEq/L (ref 19–32)
Calcium: 9.3 mg/dL (ref 8.4–10.5)
Chloride: 110 mEq/L (ref 96–112)
Creatinine, Ser: 0.93 mg/dL (ref 0.40–1.20)
GFR: 59.84 mL/min — ABNORMAL LOW (ref >60.00)
Glucose, Bld: 110 mg/dL — ABNORMAL HIGH (ref 70–99)
Potassium: 4.2 mEq/L (ref 3.5–5.1)
Sodium: 145 mEq/L (ref 135–145)
Total Bilirubin: 0.4 mg/dL (ref 0.2–1.2)
Total Protein: 6.9 g/dL (ref 6.0–8.3)

## 2022-02-22 LAB — CBC WITH DIFFERENTIAL/PLATELET
Basophils Absolute: 0 10*3/uL (ref 0.0–0.1)
Basophils Relative: 0.6 % (ref 0.0–3.0)
Eosinophils Absolute: 0 10*3/uL (ref 0.0–0.7)
Eosinophils Relative: 0.7 % (ref 0.0–5.0)
HCT: 40.1 % (ref 36.0–46.0)
Hemoglobin: 13.2 g/dL (ref 12.0–15.0)
Lymphocytes Relative: 15.4 % (ref 12.0–46.0)
Lymphs Abs: 1 10*3/uL (ref 0.7–4.0)
MCHC: 32.9 g/dL (ref 30.0–36.0)
MCV: 92.9 fl (ref 78.0–100.0)
Monocytes Absolute: 0.4 10*3/uL (ref 0.1–1.0)
Monocytes Relative: 6.3 % (ref 3.0–12.0)
Neutro Abs: 5.1 10*3/uL (ref 1.4–7.7)
Neutrophils Relative %: 77 % (ref 43.0–77.0)
Platelets: 169 10*3/uL (ref 150.0–400.0)
RBC: 4.32 Mil/uL (ref 3.87–5.11)
RDW: 13.8 % (ref 11.5–15.5)
WBC: 6.6 10*3/uL (ref 4.0–10.5)

## 2022-02-22 NOTE — Patient Instructions (Signed)
Your provider has requested that you have an abdominal x ray before leaving today. Please go to the basement floor to our Radiology department for the test. Please proceed to the basement level for lab work before leaving today. Press "B" on the elevator. The lab is located at the first door on the left as you exit the elevator.  HEALTHCARE LAWS AND MY CHART RESULTS:   Due to recent changes in healthcare laws, you may see results of your imaging and/or laboratory studies on MyChart before I have had a chance to review them.  I understand that in some cases there may be results that are confusing or concerning to you. Please understand that not all results are received at the same time and often I may need to interpret multiple results in order to provide you with the best plan of care or course of treatment. Therefore, I ask that you please give me 48 hours to thoroughly review all your results before contacting my office for clarification.   RECOMMENDATIONS:  Miralax- Dissolve 1/2 capful in 8 ounces of water and drink before bed. Ibgard 1 capsule twice a day for abdominal pain. Go to the emergency room if you develop severe abdominal pain. We have scheduled you a follow up with Dr. Silverio Decamp on 04/28/22 at 9:50 am.  BMI:  If you are age 27 or older, your body mass index should be between 23-30. Your Body mass index is 26.34 kg/m. If this is out of the aforementioned range listed, please consider follow up with your Primary Care Provider.  If you are age 52 or younger, your body mass index should be between 19-25. Your Body mass index is 26.34 kg/m. If this is out of the aformentioned range listed, please consider follow up with your Primary Care Provider.   MY CHART:  The [facility] providers would like to encourage you to use [facility] to communicate with providers for non-urgent requests or questions.  Due to long hold times on the telephone, sending your provider a message by [facility] may be a  faster and more efficient way to get a response.  Please allow 48 business hours for a response.  Please remember that this is for non-urgent requests.   Thank you for trusting me with your gastrointestinal care!    Noralyn Pick, CRNP

## 2022-02-25 ENCOUNTER — Other Ambulatory Visit: Payer: Self-pay

## 2022-02-25 ENCOUNTER — Other Ambulatory Visit: Payer: Medicare Other

## 2022-02-25 ENCOUNTER — Ambulatory Visit (INDEPENDENT_AMBULATORY_CARE_PROVIDER_SITE_OTHER)
Admission: RE | Admit: 2022-02-25 | Discharge: 2022-02-25 | Disposition: A | Discharge status: Home / Self Care | Payer: Medicare Other | Source: Ambulatory Visit | Attending: Nurse Practitioner | Admitting: Nurse Practitioner

## 2022-02-25 DIAGNOSIS — K5730 Diverticulosis of large intestine without perforation or abscess without bleeding: Secondary | ICD-10-CM

## 2022-02-25 DIAGNOSIS — R1030 Lower abdominal pain, unspecified: Secondary | ICD-10-CM

## 2022-02-25 DIAGNOSIS — K5900 Constipation, unspecified: Secondary | ICD-10-CM

## 2022-03-01 ENCOUNTER — Telehealth: Payer: Self-pay | Admitting: Nurse Practitioner

## 2022-03-01 NOTE — Telephone Encounter (Signed)
Patient returned call

## 2022-03-02 DIAGNOSIS — N200 Calculus of kidney: Secondary | ICD-10-CM | POA: Diagnosis not present

## 2022-03-02 NOTE — Telephone Encounter (Signed)
Addressed in a previously created encounter 

## 2022-03-04 DIAGNOSIS — G4733 Obstructive sleep apnea (adult) (pediatric): Secondary | ICD-10-CM | POA: Diagnosis not present

## 2022-03-15 DIAGNOSIS — Z85828 Personal history of other malignant neoplasm of skin: Secondary | ICD-10-CM | POA: Diagnosis not present

## 2022-03-15 DIAGNOSIS — L811 Chloasma: Secondary | ICD-10-CM | POA: Diagnosis not present

## 2022-04-04 DIAGNOSIS — G4733 Obstructive sleep apnea (adult) (pediatric): Secondary | ICD-10-CM | POA: Diagnosis not present

## 2022-04-28 ENCOUNTER — Ambulatory Visit (INDEPENDENT_AMBULATORY_CARE_PROVIDER_SITE_OTHER): Payer: Medicare Other | Admitting: Gastroenterology

## 2022-04-28 ENCOUNTER — Encounter: Payer: Self-pay | Admitting: Gastroenterology

## 2022-04-28 VITALS — BP 140/80 | HR 73 | Ht 62.5 in | Wt 134.0 lb

## 2022-04-28 DIAGNOSIS — R109 Unspecified abdominal pain: Secondary | ICD-10-CM | POA: Diagnosis not present

## 2022-04-28 DIAGNOSIS — Z8719 Personal history of other diseases of the digestive system: Secondary | ICD-10-CM

## 2022-04-28 DIAGNOSIS — K582 Mixed irritable bowel syndrome: Secondary | ICD-10-CM

## 2022-04-28 DIAGNOSIS — R159 Full incontinence of feces: Secondary | ICD-10-CM

## 2022-04-28 DIAGNOSIS — M4120 Other idiopathic scoliosis, site unspecified: Secondary | ICD-10-CM

## 2022-04-28 DIAGNOSIS — M5440 Lumbago with sciatica, unspecified side: Secondary | ICD-10-CM

## 2022-04-28 NOTE — Patient Instructions (Addendum)
You have been scheduled for a colonoscopy. Please follow written instructions given to you at your visit today.  ?Please pick up your prep supplies at the pharmacy within the next 1-3 days. ?If you use inhalers (even only as needed), please bring them with you on the day of your procedure.  2 day prep Split Dose Miralax  ? ?Start Miralax 1 capful daily  ? ?We will refer you to Charlann Boxer, MD Sports Medicine  ? ?NO ORAL METFORMIN THE MORNING OF YOUR PROCEDURE ? ?Follow up in 3 months, you will need to call us back for that appointment  ? ?If you are age 16 or older, your body mass index should be between 23-30. Your Body mass index is 24.12 kg/m?[name] Kitchen If this is out of the aforementioned range listed, please consider follow up with your Primary Care Provider. ? ?If you are age 64 or younger, your body mass index should be between 19-25. Your Body mass index is 24.12 kg/m?[name] Kitchen If this is out of the aformentioned range listed, please consider follow up with your Primary Care Provider.  ? ?________________________________________________________ ? ?The [facility] providers would like to encourage you to use [facility] to communicate with providers for non-urgent requests or questions.  Due to long hold times on the telephone, sending your provider a message by [facility] may be a faster and more efficient way to get a response.  Please allow 48 business hours for a response.  Please remember that this is for non-urgent requests.  ?_______________________________________________________  ? ?Due to recent changes in healthcare laws, you may see the results of your imaging and laboratory studies on MyChart before your provider has had a chance to review them.  We understand that in some cases there may be results that are confusing or concerning to you. Not all laboratory results come back in the same time frame and the provider may be waiting for multiple results in order to interpret others.  Please give [place] 48 hours in order for your  provider to thoroughly review all the results before contacting the office for clarification of your results.   ? ?I appreciate the  opportunity to care for you ? ?Thank You  ? ?Harl Bowie , MD  ?

## 2022-04-28 NOTE — Progress Notes (Signed)
?       ? ?       ? ?Kristen Mahoney    [id number]    [birth year] ? ?Primary Care Physician:Wharton, Loma Sousa, PA-C ? ?Referring Physician: Marda Stalker, PA-C ?[address] ?[place],  [place] [address] ? ? ?Chief complaint: Constipation ?HPI: ? ?76 year old very pleasant female here for follow-up visit for abdominal pain and constipation. ?She continues to have intermittent abdominal discomfort. ? ?CT abdomen and pelvis with contrast December 2022 negative for any acute diverticulitis or complications. ?  ?She is not taking MiraLAX as it causes diarrhea.  She continues to have irregular bowel habits with significant constipation.  Denies any rectal bleeding ?  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  ?She is s/p hemorrhoidal band ligation by Dr. Marcello Moores ?  ?Denies any decreased appetite or weight loss recently. ?  ?GI Hx: ?Abdominal/pelvic CT 09/07/2019  ?Bilateral nephrolithiasis is noted. No hydronephrosis or renal ?obstruction is noted.  ?Linear hyperdensity is noted posteriorly in the urinary bladder ?which most likely represents blood, but possible mass cannot be ?excluded. Cystoscopy is recommended for further evaluation. ?Aortic Atherosclerosis  ?  ?Colonoscopy 10/12/2017 by Dr. Silverio Decamp: ?Decreased sphincter tone found on digital rectal exam. ?- Diverticulosis in the sigmoid colon and in the descending colon. ?- The examination was otherwise normal. ?- No specimens collected. ?-Repeat colonoscopy in 5 years for screening purposes. ?  ?Colonoscopy 04/25/2013 by Dr. Maurene Capes: Incompetent rectal sphincter, evidence of prior rectal prolapse ? ? ?Outpatient Encounter Medications as of 04/28/2022  ?Medication Sig  ? ACCU-CHEK GUIDE test strip USE TO CHECK BLOOD SUGAR TWO TO THREE TIMES A WEEK AS DIRECTED  ? acetaminophen (TYLENOL) 500 MG tablet Take 1,000 mg by mouth every 6 (six) hours as needed (for pain.).  ? aspirin EC  81 MG tablet Take 81 mg by mouth every evening.   ? atorvastatin (LIPITOR) 20 MG tablet Take 20 mg by mouth daily.   ? betamethasone valerate ointment (VALISONE) 0.1 % Use a pea sized amount topically BID for up to one week as needed.  ? buPROPion (WELLBUTRIN XL) 150 MG 24 hr tablet Take 150 mg by mouth daily.  ? Diaphragm Arc-Spring (CAYA) [place] [address] 1 Device vaginally as needed.  ? diclofenac Sodium (VOLTAREN) 1 % GEL Apply 2 g topically daily as needed (pain).  ? fluticasone (FLONASE) 50 MCG/ACT nasal spray Place 1 spray into both nostrils daily as needed for allergies.   ? hydrocortisone 1 % ointment Apply 1 application topically 3 (three) times daily. (Patient taking differently: Apply 1 application topically 3 (three) times daily as needed (mosquito bite).)  ? hyoscyamine (LEVSIN SL) 0.125 MG SL tablet DISSOLVE 1 TABLET IN MOUTH EVERY 6 HOURS AS NEEDED FOR CRAMPS  ? ibuprofen (ADVIL) 400 MG tablet Take 1 tablet (400 mg total) by mouth every 6 (six) hours as needed for moderate pain or mild pain.  ? loratadine (CLARITIN) 10 MG tablet Take 10 mg by mouth daily.  ? metFORMIN (GLUCOPHAGE-XR) 500 MG 24 hr tablet Take 500 mg by mouth daily.   ? metoprolol succinate (TOPROL XL) 50 MG 24 hr tablet Take 1 tablet (50 mg total) by mouth daily. Take with or immediately following a meal.  ? Multiple Vitamins-Minerals (ICAPS) CAPS Take 1 capsule by mouth 2 (two) times daily.   ? omeprazole (PRILOSEC) 20 MG capsule Take 1 capsule (20 mg total) by mouth 2 (two) times  daily before a meal.  ? ondansetron (ZOFRAN-ODT) 4 MG disintegrating tablet Take 1 tablet (4 mg total) by mouth every 8 (eight) hours as needed for nausea or vomiting.  ? oxyCODONE (OXY IR/ROXICODONE) 5 MG immediate release tablet Take 0.5-1 tablets (2.5-5 mg total) by mouth every 6 (six) hours as needed for severe pain.  ? Polyethyl Glycol-Propyl Glycol (SYSTANE) 0.4-0.3 % SOLN Place 1 drop into both eyes 2 (two) times daily.  ? RESTASIS 0.05 % ophthalmic  emulsion Place 1 drop into both eyes every 12 (twelve) hours.  ? tamsulosin (FLOMAX) 0.4 MG CAPS capsule Take 1 capsule (0.4 mg total) by mouth daily.  ? valACYclovir (VALTREX) 500 MG tablet Take one tablet twice daily for 3-5 days as needed with an outbreak, then daily as needed (Patient taking differently: Take 500 mg by mouth 2 (two) times daily as needed (outbreak).)  ? Vitamin D, Cholecalciferol, 400 units TABS Take 400 Units by mouth daily.   ? vitamin E 180 MG (400 UNITS) capsule Take 400 Units by mouth daily.  ? ?No facility-administered encounter medications on file as of 04/28/2022.  ? ? ?Allergies as of 04/28/2022 - Review Complete 02/22/2022  ?Allergen Reaction Noted  ? Ciprofloxacin Rash 09/04/2009  ? Morphine and related Itching 05/07/2011  ? Prozac [fluoxetine]  02/04/2021  ? Zoloft [sertraline hcl]  02/04/2021  ? ? ?Past Medical History:  ?Diagnosis Date  ? Allergy   ? seasonal  ? Anxiety   ? Arthritis   ? knees,all over  ? Basal cell carcinoma   ? Benign breast cyst in female   ? PATIENT HAS HISTORY OF BREAST CYSTS  ? Cataract   ? beginning bilateral  ? Diabetes mellitus   ? GERD (gastroesophageal reflux disease)   ? Headache   ? Herpes progenitalis   ? Hyperlipidemia   ? Hypertension   ? Kidney stone   ? Kidney stones   ? OSA on CPAP   ? Sleep apnea   ? cpap  ? Urinary, incontinence, stress female   ? Vertigo   ? ? ?Past Surgical History:  ?Procedure Laterality Date  ? ABDOMINAL HYSTERECTOMY  1986  ? leiomyomata  ? Basal cell excised    ? breast cystectomy    ? right  ? CHOLECYSTECTOMY  2003  ? COLONOSCOPY    ? CYSTOSCOPY/URETEROSCOPY/HOLMIUM LASER/STENT PLACEMENT Right 01/12/2022  ? Procedure: CYSTOSCOPY/RETROGRADE/ URETEROSCOPY/HOLMIUM LASER/STENT PLACEMENT;  Surgeon: Robley Fries, MD;  Location: [facility];  Service: Urology;  Laterality: Right;  ? ear cystectomy    ? ESOPHAGOGASTRODUODENOSCOPY    ? KNEE ARTHROSCOPY    ? right  ? LITHOTRIPSY    ? RECTOVAGINAL FISTULA CLOSURE    ? WISDOM TOOTH  EXTRACTION    ? ? ?Family History  ?Problem Relation Age of Onset  ? Diabetes Mother   ? Diabetes Father   ? Colon cancer Father   ? Heart disease Father   ? Hypertension Father   ? Diabetes Sister   ? Hypertension Sister   ? Diabetes Brother   ? Colon polyps Brother   ? Prostate cancer Brother   ? Breast cancer Paternal Aunt   ?     Age 31's  ? Colon polyps Brother   ? Heart disease Brother   ? Stroke Brother   ? Hyperlipidemia Brother   ? Colon polyps Sister   ? Diabetes Sister   ? Hypertension Sister   ? Heart disease Sister   ? Esophageal cancer Neg Hx   ?  Rectal cancer Neg Hx   ? Stomach cancer Neg Hx   ? ? ?Social History  ? ?Socioeconomic History  ? Marital status: Married  ?  Spouse name: Not on file  ? Number of children: 2  ? Years of education: Not on file  ? Highest education level: Not on file  ?Occupational History  ? Occupation: Retired  ?  Employer: RETIRED  ?Tobacco Use  ? Smoking status: Former  ? Smokeless tobacco: Never  ?Vaping Use  ? Vaping Use: Never used  ?Substance and Sexual Activity  ? Alcohol use: Not Currently  ?  Comment: rare  ? Drug use: No  ? Sexual activity: Not Currently  ?  Birth control/protection: Surgical  ?Other Topics Concern  ? Not on file  ?Social History Narrative  ? Denies caffeine use.  ? ?Social Determinants of Health  ? ?Financial Resource Strain: Not on file  ?Food Insecurity: Not on file  ?Transportation Needs: Not on file  ?Physical Activity: Not on file  ?Stress: Not on file  ?Social Connections: Not on file  ?Intimate Partner Violence: Not on file  ? ? ? ? ?Review of systems: ?All other review of systems negative except as mentioned in the HPI. ? ? ?Physical Exam: ?Vitals:  ? 04/28/22 1011  ?BP: 140/80  ?Pulse: 73  ? ?Body mass index is 24.12 kg/m?. ?Gen:      No acute distress ?HEENT:  sclera anicteric ?Abd:      soft, non-tender; no palpable masses, no distension ?Ext:    No edema ?Neuro: alert and oriented x 3 ?Psych: normal mood and affect ? ?Data  Reviewed: ? ?Reviewed labs, radiology imaging, old records and pertinent past GI work up ? ? ?Assessment and Plan/Recommendations: ? ?76 year old very pleasant female with history of left side diverticular disease with left

## 2022-05-04 DIAGNOSIS — G4733 Obstructive sleep apnea (adult) (pediatric): Secondary | ICD-10-CM | POA: Diagnosis not present

## 2022-05-07 DIAGNOSIS — E119 Type 2 diabetes mellitus without complications: Secondary | ICD-10-CM | POA: Diagnosis not present

## 2022-05-10 DIAGNOSIS — E785 Hyperlipidemia, unspecified: Secondary | ICD-10-CM | POA: Diagnosis not present

## 2022-05-10 DIAGNOSIS — E119 Type 2 diabetes mellitus without complications: Secondary | ICD-10-CM | POA: Diagnosis not present

## 2022-05-10 DIAGNOSIS — I10 Essential (primary) hypertension: Secondary | ICD-10-CM | POA: Diagnosis not present

## 2022-05-10 DIAGNOSIS — G4733 Obstructive sleep apnea (adult) (pediatric): Secondary | ICD-10-CM | POA: Diagnosis not present

## 2022-05-13 DIAGNOSIS — H16223 Keratoconjunctivitis sicca, not specified as Sjogren's, bilateral: Secondary | ICD-10-CM | POA: Diagnosis not present

## 2022-05-13 DIAGNOSIS — E119 Type 2 diabetes mellitus without complications: Secondary | ICD-10-CM | POA: Diagnosis not present

## 2022-05-13 DIAGNOSIS — H35033 Hypertensive retinopathy, bilateral: Secondary | ICD-10-CM | POA: Diagnosis not present

## 2022-05-13 DIAGNOSIS — H35363 Drusen (degenerative) of macula, bilateral: Secondary | ICD-10-CM | POA: Diagnosis not present

## 2022-05-26 NOTE — Progress Notes (Unsigned)
[place] [facility] [place] Phone: [phone] Subjective:   Fontaine No, am serving as a scribe for Dr. Hulan Saas.   I'm seeing this patient by the request  of:  Marda Stalker, PA-C  CC: back pain   [id number]  KARLENA LUEBKE is a 76 y.o. female coming in with complaint of back pain. PMHx for kidney stones. Patient states that her pain is in lumbar spine and glutes. At night her pain will be in scapula. Uses heat, Tylenol, and epidurals for relief. Does have issues with bowel movements. Last epidural around 2019.    Most recent CT for renal stone was done December 2022 showing an 8 mm right UPJ stone causing mild right hydronephrosis.  Patient also had nonobstructing stones in the left side.  Last MRI of the lumbar spine that is accessible to [place] today was from 2012.  MRI at that point did show compression of the L4 nerve root on the right side from a disc herniation with small fragmentation but seems to be stable from an MRI in 2007.  All reviewing patient's chart appears that patient did have 2 epidurals in 2012.    Past Medical History:  Diagnosis Date   Allergy    seasonal   Anxiety    Arthritis    knees,all over   Basal cell carcinoma    Benign breast cyst in female    PATIENT HAS HISTORY OF BREAST CYSTS   Cataract    beginning bilateral   Diabetes mellitus    GERD (gastroesophageal reflux disease)    Headache    Herpes progenitalis    Hyperlipidemia    Hypertension    Kidney stone    Kidney stones    OSA on CPAP    Sleep apnea    cpap   Urinary, incontinence, stress female    Vertigo    Past Surgical History:  Procedure Laterality Date   ABDOMINAL HYSTERECTOMY  1986   leiomyomata   Basal cell excised     breast cystectomy     right   CHOLECYSTECTOMY  2003   COLONOSCOPY     CYSTOSCOPY/URETEROSCOPY/HOLMIUM LASER/STENT PLACEMENT Right 01/12/2022   Procedure: CYSTOSCOPY/RETROGRADE/  URETEROSCOPY/HOLMIUM LASER/STENT PLACEMENT;  Surgeon: Robley Fries, MD;  Location: [facility];  Service: Urology;  Laterality: Right;   ear cystectomy     ESOPHAGOGASTRODUODENOSCOPY     KNEE ARTHROSCOPY     right   LITHOTRIPSY     RECTOVAGINAL FISTULA CLOSURE     WISDOM TOOTH EXTRACTION     Social History   Socioeconomic History   Marital status: Married    Spouse name: Not on file   Number of children: 2   Years of education: Not on file   Highest education level: Not on file  Occupational History   Occupation: Retired    Fish farm manager: RETIRED  Tobacco Use   Smoking status: Former   Smokeless tobacco: Never  Scientific laboratory technician Use: Never used  Substance and Sexual Activity   Alcohol use: Not Currently    Comment: rare   Drug use: No   Sexual activity: Not Currently    Birth control/protection: Surgical  Other Topics Concern   Not on file  Social History Narrative   Denies caffeine use.   Social Determinants of Health   Financial Resource Strain: Not on file  Food Insecurity: Not on file  Transportation Needs: Not on file  Physical Activity: Not  on file  Stress: Not on file  Social Connections: Not on file   Allergies  Allergen Reactions   Ciprofloxacin Rash   Morphine And Related Itching   Prozac [Fluoxetine]     Other reaction(s): sweats, jittery   Zoloft [Sertraline Hcl]     Other reaction(s): sweating, jittery   Family History  Problem Relation Age of Onset   Diabetes Mother    Diabetes Father    Colon cancer Father    Heart disease Father    Hypertension Father    Diabetes Sister    Hypertension Sister    Diabetes Brother    Colon polyps Brother    Prostate cancer Brother    Breast cancer Paternal Aunt        Age 73's   Colon polyps Brother    Heart disease Brother    Stroke Brother    Hyperlipidemia Brother    Colon polyps Sister    Diabetes Sister    Hypertension Sister    Heart disease Sister    Esophageal cancer Neg Hx    Rectal  cancer Neg Hx    Stomach cancer Neg Hx     Current Outpatient Medications (Endocrine & Metabolic):    metFORMIN (GLUCOPHAGE-XR) 500 MG 24 hr tablet, Take 500 mg by mouth daily.   Current Outpatient Medications (Cardiovascular):    atorvastatin (LIPITOR) 20 MG tablet, Take 20 mg by mouth daily.    metoprolol succinate (TOPROL XL) 50 MG 24 hr tablet, Take 1 tablet (50 mg total) by mouth daily. Take with or immediately following a meal.  Current Outpatient Medications (Respiratory):    fluticasone (FLONASE) 50 MCG/ACT nasal spray, Place 1 spray into both nostrils daily as needed for allergies.    loratadine (CLARITIN) 10 MG tablet, Take 10 mg by mouth daily.  Current Outpatient Medications (Analgesics):    acetaminophen (TYLENOL) 500 MG tablet, Take 1,000 mg by mouth every 6 (six) hours as needed (for pain.).   aspirin EC 81 MG tablet, Take 81 mg by mouth every evening.    ibuprofen (ADVIL) 400 MG tablet, Take 1 tablet (400 mg total) by mouth every 6 (six) hours as needed for moderate pain or mild pain.   oxyCODONE (OXY IR/ROXICODONE) 5 MG immediate release tablet, Take 0.5-1 tablets (2.5-5 mg total) by mouth every 6 (six) hours as needed for severe pain.   Current Outpatient Medications (Other):    ACCU-CHEK GUIDE test strip, USE TO CHECK BLOOD SUGAR TWO TO THREE TIMES A WEEK AS DIRECTED   betamethasone valerate ointment (VALISONE) 0.1 %, Use a pea sized amount topically BID for up to one week as needed.   buPROPion (WELLBUTRIN XL) 150 MG 24 hr tablet, Take 150 mg by mouth daily.   Diaphragm Arc-Spring (CAYA) [facility], Place 1 Device vaginally as needed.   diclofenac Sodium (VOLTAREN) 1 % GEL, Apply 2 g topically daily as needed (pain).   gabapentin (NEURONTIN) 100 MG capsule, Take 1 capsule (100 mg total) by mouth at bedtime.   gabapentin (NEURONTIN) 100 MG capsule, Take 1 capsule (100 mg total) by mouth at bedtime.   hydrocortisone 1 % ointment, Apply 1 application topically 3 (three) times  daily. (Patient taking differently: Apply 1 application. topically 3 (three) times daily as needed (mosquito bite).)   hyoscyamine (LEVSIN SL) 0.125 MG SL tablet, DISSOLVE 1 TABLET IN MOUTH EVERY 6 HOURS AS NEEDED FOR CRAMPS   Multiple Vitamins-Minerals (ICAPS) CAPS, Take 1 capsule by mouth 2 (two) times daily.  omeprazole (PRILOSEC) 20 MG capsule, Take 1 capsule (20 mg total) by mouth 2 (two) times daily before a meal.   ondansetron (ZOFRAN-ODT) 4 MG disintegrating tablet, Take 1 tablet (4 mg total) by mouth every 8 (eight) hours as needed for nausea or vomiting.   Polyethyl Glycol-Propyl Glycol (SYSTANE) 0.4-0.3 % SOLN, Place 1 drop into both eyes 2 (two) times daily.   RESTASIS 0.05 % ophthalmic emulsion, Place 1 drop into both eyes every 12 (twelve) hours.   tamsulosin (FLOMAX) 0.4 MG CAPS capsule, Take 1 capsule (0.4 mg total) by mouth daily.   valACYclovir (VALTREX) 500 MG tablet, Take one tablet twice daily for 3-5 days as needed with an outbreak, then daily as needed (Patient taking differently: Take 500 mg by mouth 2 (two) times daily as needed (outbreak).)   Vitamin D, Cholecalciferol, 400 units TABS, Take 400 Units by mouth daily.    vitamin E 180 MG (400 UNITS) capsule, Take 400 Units by mouth daily.   Reviewed prior external information including notes and imaging from  primary care provider As well as notes that were available from care everywhere and other healthcare systems.  Past medical history, social, surgical and family history all reviewed in electronic medical record.  No pertanent information unless stated regarding to the chief complaint.   Review of Systems:  No headache, visual changes, nausea, vomiting, diarrhea, constipation, dizziness, abdominal pain, skin rash, fevers, chills, night sweats, weight loss, swollen lymph nodes, body aches, joint swelling, chest pain, shortness of breath, mood changes. POSITIVE muscle aches  Objective  Blood pressure 108/62, pulse  84, height 5' 2.5" (1.588 m), weight 136 lb (61.7 kg), SpO2 98 %.   General: No apparent distress alert and oriented x3 mood and affect normal, dressed appropriately.  HEENT: Pupils equal, extraocular movements intact  Respiratory: Patient's speak in full sentences and does not appear short of breath  Cardiovascular: No lower extremity edema, non tender, no erythema  Gait normal with good balance and coordination.  MSK: Back exam shows mild increase in kyphosis of the upper thoracic spine.  The patient does have diffuse tenderness in the thoracic area even to light palpation.  Patient does have some very mild CVA tenderness noted.  In addition to this though patient does also have loss of lordosis of the lumbar spine with some tightness noted with FABER test.  Patient has tightness with straight leg test on the left side but no true radicular symptoms at this moment.  Some mild atrophy of the lower extremities bilaterally.  Patient does appear to be minorly deconditioned  [id number]; 15 additional minutes spent for Therapeutic exercises as stated in above notes.  This included exercises focusing on stretching, strengthening, with significant focus on eccentric aspects.   Long term goals include an improvement in range of motion, strength, endurance as well as avoiding reinjury. Patient's frequency would include in 1-2 times a day, 3-5 times a week for a duration of 6-12 weeks. Low back exercises that included:  Pelvic tilt/bracing instruction to focus on control of the pelvic girdle and lower abdominal muscles  Glute strengthening exercises, focusing on proper firing of the glutes without engaging the low back muscles Proper stretching techniques for maximum relief for the hamstrings, hip flexors, low back and some rotation where tolerated  Proper technique shown and discussed handout in great detail with ATC.  All questions were discussed and answered.      Impression and Recommendations:     The  above documentation has  been reviewed and is accurate and complete Lyndal Pulley, DO

## 2022-05-27 ENCOUNTER — Encounter: Payer: Self-pay | Admitting: Family Medicine

## 2022-05-27 ENCOUNTER — Ambulatory Visit (INDEPENDENT_AMBULATORY_CARE_PROVIDER_SITE_OTHER): Payer: Medicare Other | Admitting: Family Medicine

## 2022-05-27 ENCOUNTER — Ambulatory Visit (INDEPENDENT_AMBULATORY_CARE_PROVIDER_SITE_OTHER): Payer: Medicare Other

## 2022-05-27 VITALS — BP 108/62 | HR 84 | Ht 62.5 in | Wt 136.0 lb

## 2022-05-27 DIAGNOSIS — M546 Pain in thoracic spine: Secondary | ICD-10-CM

## 2022-05-27 DIAGNOSIS — G8929 Other chronic pain: Secondary | ICD-10-CM | POA: Diagnosis not present

## 2022-05-27 DIAGNOSIS — M545 Low back pain, unspecified: Secondary | ICD-10-CM

## 2022-05-27 DIAGNOSIS — M2550 Pain in unspecified joint: Secondary | ICD-10-CM

## 2022-05-27 LAB — COMPREHENSIVE METABOLIC PANEL
ALT: 26 U/L (ref 0–35)
AST: 17 U/L (ref 0–37)
Albumin: 4.3 g/dL (ref 3.5–5.2)
Alkaline Phosphatase: 74 U/L (ref 39–117)
BUN: 20 mg/dL (ref 6–23)
CO2: 28 mEq/L (ref 19–32)
Calcium: 9.7 mg/dL (ref 8.4–10.5)
Chloride: 105 mEq/L (ref 96–112)
Creatinine, Ser: 0.92 mg/dL (ref 0.40–1.20)
GFR: 60.51 mL/min (ref >60.00)
Glucose, Bld: 89 mg/dL (ref 70–99)
Potassium: 4.1 mEq/L (ref 3.5–5.1)
Sodium: 142 mEq/L (ref 135–145)
Total Bilirubin: 0.4 mg/dL (ref 0.2–1.2)
Total Protein: 7.4 g/dL (ref 6.0–8.3)

## 2022-05-27 LAB — CBC WITH DIFFERENTIAL/PLATELET
Basophils Absolute: 0 10*3/uL (ref 0.0–0.1)
Basophils Relative: 0.4 % (ref 0.0–3.0)
Eosinophils Absolute: 0.1 10*3/uL (ref 0.0–0.7)
Eosinophils Relative: 0.8 % (ref 0.0–5.0)
HCT: 41 % (ref 36.0–46.0)
Hemoglobin: 13.5 g/dL (ref 12.0–15.0)
Lymphocytes Relative: 19.6 % (ref 12.0–46.0)
Lymphs Abs: 1.4 10*3/uL (ref 0.7–4.0)
MCHC: 33 g/dL (ref 30.0–36.0)
MCV: 94.5 fl (ref 78.0–100.0)
Monocytes Absolute: 0.5 10*3/uL (ref 0.1–1.0)
Monocytes Relative: 6.8 % (ref 3.0–12.0)
Neutro Abs: 5 10*3/uL (ref 1.4–7.7)
Neutrophils Relative %: 72.4 % (ref 43.0–77.0)
Platelets: 199 10*3/uL (ref 150.0–400.0)
RBC: 4.34 Mil/uL (ref 3.87–5.11)
RDW: 14.2 % (ref 11.5–15.5)
WBC: 6.9 10*3/uL (ref 4.0–10.5)

## 2022-05-27 LAB — IBC PANEL
Iron: 78 ug/dL (ref 42–145)
Saturation Ratios: 20 % (ref 20.0–50.0)
TIBC: 390.6 ug/dL (ref 250.0–450.0)
Transferrin: 279 mg/dL (ref 212.0–360.0)

## 2022-05-27 LAB — FERRITIN: Ferritin: 20.2 ng/mL (ref 10.0–291.0)

## 2022-05-27 LAB — VITAMIN B12: Vitamin B-12: 292 pg/mL (ref 211–911)

## 2022-05-27 LAB — VITAMIN D 25 HYDROXY (VIT D DEFICIENCY, FRACTURES): VITD: 44.99 ng/mL (ref 30.00–100.00)

## 2022-05-27 LAB — SEDIMENTATION RATE: Sed Rate: 26 mm/hr (ref 0–30)

## 2022-05-27 MED ORDER — GABAPENTIN 100 MG PO CAPS: 100.0000 mg | ORAL_CAPSULE | Freq: Every day | ORAL | 0 refills | Status: DC

## 2022-05-27 MED ORDER — GABAPENTIN 100 MG PO CAPS: 100.0000 mg | ORAL_CAPSULE | Freq: Every day | ORAL | 3 refills | Status: DC

## 2022-05-27 NOTE — Patient Instructions (Addendum)
Xray lumbar and thoracic  today Mirilax 17g daily, Colace '100mg'$  daily for 7 days if worsen constipation but lets see what the colonoscopy shows  Gabapentin '100mg'$  at night to help with sleep and pain  Exercises 3 times a week.  See me again in 6 weeks

## 2022-05-28 ENCOUNTER — Telehealth: Payer: Self-pay | Admitting: Family Medicine

## 2022-05-28 DIAGNOSIS — M546 Pain in thoracic spine: Secondary | ICD-10-CM | POA: Insufficient documentation

## 2022-05-28 NOTE — Telephone Encounter (Signed)
Would consider halfing the percocet and only the 1 pill of the gabapentin to see how she feels but should be fine. Would not do other recreational medications much with the percocet (EToH)

## 2022-05-28 NOTE — Telephone Encounter (Signed)
Spoke with patient.

## 2022-05-28 NOTE — Assessment & Plan Note (Signed)
Patient is having some more thoracic back pain in the thoracolumbar junction.  Known to have low back pain and does have some tightness noted on exam today.  Patient has responded to epidurals in the lumbar spine previously due to patient having more of a tingling sensation and being up higher and we did discuss different treatment options.  New x-rays ordered today as well as we will get laboratory work-up to make sure nothing else systemic is giving patient some symptoms.  Home exercises given today.  Discussed physical therapy which patient declined.  Follow-up again in 6 weeks otherwise.  Patient knows if any worsening symptoms to seek medical attention immediately.

## 2022-05-28 NOTE — Telephone Encounter (Signed)
Pt seen yesterday. Is going on Monday for a tooth extraction and will be prescribed pain meds for several days ( Percoset possibly ).  Pt wondering if she should stop taking her Gabapentin while on the pain meds.

## 2022-05-29 DIAGNOSIS — J018 Other acute sinusitis: Secondary | ICD-10-CM | POA: Diagnosis not present

## 2022-06-03 ENCOUNTER — Telehealth: Payer: Self-pay | Admitting: Gastroenterology

## 2022-06-03 NOTE — Telephone Encounter (Signed)
Spoke with the patient. She is on Augmentin for sinusitis. She was prescribed this on 05/29/22 and has 4 more days on it. She is experiencing diarrhea. Asked how often and she said "every second."  Anus is "raw" and she is using Vasoline to protect this area. She has also been prescribed Gabapentin 100 mg to take at bedtime. She has been taking Pepto-Bismol for the diarrhea. Her stools are black. She has Imodium in the home but has not tried this. Her sinus infection is "a little better but my ears still hurt." She understands maintaining hydration, avoiding dairy and high fructose sweeteners. She is agreeable to trying the Imodium by the package directions. If her diarrhea continues after she has finished the antibiotics, she will call us again.

## 2022-06-03 NOTE — Telephone Encounter (Signed)
Inbound call from patient stating that she had to go to urgent care for her sinuses and was put on a medication to help with that and has also been put on a medication to help her back. Patient stated that since she has started the medications she's been having diarrhea everyday and adb pain. Patient is requesting a call back to discuss if there is any recommendations for her. Please advise.

## 2022-06-16 DIAGNOSIS — Z85828 Personal history of other malignant neoplasm of skin: Secondary | ICD-10-CM | POA: Diagnosis not present

## 2022-06-16 DIAGNOSIS — L811 Chloasma: Secondary | ICD-10-CM | POA: Diagnosis not present

## 2022-07-06 ENCOUNTER — Ambulatory Visit (AMBULATORY_SURGERY_CENTER): Payer: Medicare Other | Admitting: Gastroenterology

## 2022-07-06 ENCOUNTER — Encounter: Payer: Self-pay | Admitting: Gastroenterology

## 2022-07-06 VITALS — BP 124/65 | HR 79 | Temp 98.4°F | Resp 15 | Ht 62.0 in | Wt 134.0 lb

## 2022-07-06 DIAGNOSIS — Z8719 Personal history of other diseases of the digestive system: Secondary | ICD-10-CM

## 2022-07-06 DIAGNOSIS — R933 Abnormal findings on diagnostic imaging of other parts of digestive tract: Secondary | ICD-10-CM | POA: Diagnosis not present

## 2022-07-06 DIAGNOSIS — R1032 Left lower quadrant pain: Secondary | ICD-10-CM

## 2022-07-06 DIAGNOSIS — K582 Mixed irritable bowel syndrome: Secondary | ICD-10-CM

## 2022-07-06 DIAGNOSIS — K5730 Diverticulosis of large intestine without perforation or abscess without bleeding: Secondary | ICD-10-CM | POA: Diagnosis not present

## 2022-07-06 DIAGNOSIS — D120 Benign neoplasm of cecum: Secondary | ICD-10-CM | POA: Diagnosis not present

## 2022-07-06 MED ORDER — SODIUM CHLORIDE 0.9 % IV SOLN: 500.0000 mL | Freq: Once | INTRAVENOUS | Status: DC

## 2022-07-06 NOTE — Progress Notes (Signed)
Called to room to assist during endoscopic procedure.  Patient ID and intended procedure confirmed with present staff. Received instructions for my participation in the procedure from the performing physician.  

## 2022-07-06 NOTE — Progress Notes (Signed)
Report to PACU, RN, vss, BBS= Clear.  

## 2022-07-06 NOTE — Patient Instructions (Signed)
Handout on polyps and diverticulosis given.  Please call GI clinic to schedule an appointment in 2-3 months.  Await pathology results.  YOU HAD AN ENDOSCOPIC PROCEDURE TODAY AT [facility]:   Refer to the procedure report that was given to you for any specific questions about what was found during the examination.  If the procedure report does not answer your questions, please call your gastroenterologist to clarify.  If you requested that your care partner not be given the details of your procedure findings, then the procedure report has been included in a sealed envelope for you to review at your convenience later.  YOU SHOULD EXPECT: Some feelings of bloating in the abdomen. Passage of more gas than usual.  Walking can help get rid of the air that was put into your GI tract during the procedure and reduce the bloating. If you had a lower endoscopy (such as a colonoscopy or flexible sigmoidoscopy) you may notice spotting of blood in your stool or on the toilet paper. If you underwent a bowel prep for your procedure, you may not have a normal bowel movement for a few days.  Please Note:  You might notice some irritation and congestion in your nose or some drainage.  This is from the oxygen used during your procedure.  There is no need for concern and it should clear up in a day or so.  SYMPTOMS TO REPORT IMMEDIATELY:  Following lower endoscopy (colonoscopy or flexible sigmoidoscopy):  Excessive amounts of blood in the stool  Significant tenderness or worsening of abdominal pains  Swelling of the abdomen that is new, acute  Fever of 100F or higher  For urgent or emergent issues, a gastroenterologist can be reached at any hour by calling [phone]. Do not use MyChart messaging for urgent concerns.    DIET:  We do recommend a small meal at first, but then you may proceed to your regular diet.  Drink plenty of fluids but you should avoid alcoholic beverages for 24  hours.  ACTIVITY:  You should plan to take it easy for the rest of today and you should NOT DRIVE or use heavy machinery until tomorrow (because of the sedation medicines used during the test).    FOLLOW UP: Our staff will call the number listed on your records the next business day following your procedure.  We will call around 7:15- 8:00 am to check on you and address any questions or concerns that you may have regarding the information given to you following your procedure. If we do not reach you, we will leave a message.  If you develop any symptoms (ie: fever, flu-like symptoms, shortness of breath, cough etc.) before then, please call [phone].  If you test positive for Covid 19 in the 2 weeks post procedure, please call and report this information to [place].    If any biopsies were taken you will be contacted by phone or by letter within the next 1-3 weeks.  Please call us at [phone] if you have not heard about the biopsies in 3 weeks.    SIGNATURES/CONFIDENTIALITY: You and/or your care partner have signed paperwork which will be entered into your electronic medical record.  These signatures attest to the fact that that the information above on your After Visit Summary has been reviewed and is understood.  Full responsibility of the confidentiality of this discharge information lies with you and/or your care-partner.

## 2022-07-06 NOTE — Op Note (Signed)
[place] Patient Name: Kristen Mahoney Procedure Date: 07/06/2022 9:07 AM MRN: [id number] Endoscopist: Mauri Pole , MD Age: 76 Referring MD:  Date of Birth: 1946-11-03 Gender: Female Account #: [id number] Procedure:                Colonoscopy Indications:              Abdominal pain in the left lower quadrant, Abnormal                            CT of the GI tract, Follow-up of diverticulitis Medicines:                Monitored Anesthesia Care Procedure:                Pre-Anesthesia Assessment:                           - Prior to the procedure, a History and Physical                            was performed, and patient medications and                            allergies were reviewed. The patient's tolerance of                            previous anesthesia was also reviewed. The risks                            and benefits of the procedure and the sedation                            options and risks were discussed with the patient.                            All questions were answered, and informed consent                            was obtained. Prior Anticoagulants: The patient has                            taken no previous anticoagulant or antiplatelet                            agents. ASA Grade Assessment: III - A patient with                            severe systemic disease. After reviewing the risks                            and benefits, the patient was deemed in                            satisfactory condition to undergo the procedure.  After obtaining informed consent, the colonoscope                            was passed under direct vision. Throughout the                            procedure, the patient's blood pressure, pulse, and                            oxygen saturations were monitored continuously. The                            Olympus PCF-H190DL ([id number]) Colonoscope was                             introduced through the anus and advanced to the the                            cecum, identified by appendiceal orifice and                            ileocecal valve. The colonoscopy was performed                            without difficulty. The patient tolerated the                            procedure well. The quality of the bowel                            preparation was good. The ileocecal valve,                            appendiceal orifice, and rectum were photographed. Scope In: 9:13:07 AM Scope Out: 9:30:49 AM Scope Withdrawal Time: 0 hours 10 minutes 12 seconds  Total Procedure Duration: 0 hours 17 minutes 42 seconds  Findings:                 The digital rectal exam findings include decreased                            sphincter tone.                           A 2 mm polyp was found in the cecum. The polyp was                            sessile. The polyp was removed with a cold snare.                            Resection and retrieval were complete.                           Scattered small and large-mouthed diverticula were  found in the sigmoid colon. There was narrowing of                            the colon in association with the diverticular                            opening. There was evidence of diverticular spasm.                            There was no evidence of diverticular bleeding.                           Retroflexion in the rectum was not performed due to                            post-surgical anatomy. Complications:            No immediate complications. Estimated Blood Loss:     Estimated blood loss was minimal. Impression:               - Decreased sphincter tone found on digital rectal                            exam.                           - One 2 mm polyp in the cecum, removed with a cold                            snare. Resected and retrieved.                           - Severe diverticulosis in the sigmoid  colon. There                            was narrowing of the colon in association with the                            diverticular opening. There was evidence of                            diverticular spasm. There was no evidence of                            diverticular bleeding. Recommendation:           - Patient has a contact number available for                            emergencies. The signs and symptoms of potential                            delayed complications were discussed with the  patient. Return to normal activities tomorrow.                            Written discharge instructions were provided to the                            patient.                           - Resume previous diet.                           - Continue present medications.                           - Await pathology results.                           - No repeat colonoscopy due to age.                           - Return to GI clinic at the next available                            appointment in 2-3 months. Mauri Pole, MD 07/06/2022 9:37:05 AM This report has been signed electronically.

## 2022-07-06 NOTE — Progress Notes (Signed)
[facility] History and Physical   Primary Care Physician:  Marda Stalker, PA-C   Reason for Procedure:  H/o diverticulitis, persistent left lower abdominal pain and change in bowel habits  Plan:    colonoscopy with possible interventions as needed     HPI: Kristen Mahoney is a very pleasant 76 y.o. female here for colonoscopy for evaluation of persistent abdominal pain.   The risks and benefits as well as alternatives of endoscopic procedure(s) have been discussed and reviewed. All questions answered. The patient agrees to proceed.    Past Medical History:  Diagnosis Date   Allergy    seasonal   Anxiety    Arthritis    knees,all over   Basal cell carcinoma    Benign breast cyst in female    PATIENT HAS HISTORY OF BREAST CYSTS   Cataract    beginning bilateral   Diabetes mellitus    GERD (gastroesophageal reflux disease)    Headache    Herpes progenitalis    Hyperlipidemia    Hypertension    Kidney stone    Kidney stones    OSA on CPAP    Sleep apnea    cpap   Urinary, incontinence, stress female    Vertigo     Past Surgical History:  Procedure Laterality Date   ABDOMINAL HYSTERECTOMY  1986   leiomyomata   Basal cell excised     breast cystectomy     right   CHOLECYSTECTOMY  2003   COLONOSCOPY     CYSTOSCOPY/URETEROSCOPY/HOLMIUM LASER/STENT PLACEMENT Right 01/12/2022   Procedure: CYSTOSCOPY/RETROGRADE/ URETEROSCOPY/HOLMIUM LASER/STENT PLACEMENT;  Surgeon: Robley Fries, MD;  Location: [facility];  Service: Urology;  Laterality: Right;   ear cystectomy     ESOPHAGOGASTRODUODENOSCOPY     KNEE ARTHROSCOPY     right   LITHOTRIPSY     RECTOVAGINAL FISTULA CLOSURE     WISDOM TOOTH EXTRACTION      Prior to Admission medications   Medication Sig Start Date End Date Taking? Authorizing Provider  ACCU-CHEK GUIDE test strip USE TO CHECK BLOOD SUGAR TWO TO THREE TIMES A WEEK AS DIRECTED 02/18/22  Yes [provider]  acetaminophen  (TYLENOL) 500 MG tablet Take 1,000 mg by mouth every 6 (six) hours as needed (for pain.).   Yes [provider]  aspirin EC 81 MG tablet Take 81 mg by mouth every evening.    Yes [provider]  atorvastatin (LIPITOR) 20 MG tablet Take 20 mg by mouth daily.  02/14/17  Yes [provider]  betamethasone valerate ointment (VALISONE) 0.1 % Use a pea sized amount topically BID for up to one week as needed. 10/20/21  Yes Salvadore Dom, MD  buPROPion (WELLBUTRIN XL) 150 MG 24 hr tablet Take 150 mg by mouth daily. 08/08/19  Yes [provider]  Diaphragm Arc-Spring [place]) Paia Place 1 Device vaginally as needed. 09/07/21  Yes Dohmeier, Asencion Partridge, MD  diclofenac Sodium (VOLTAREN) 1 % GEL Apply 2 g topically daily as needed (pain).   Yes [provider]  fluticasone (FLONASE) 50 MCG/ACT nasal spray Place 1 spray into both nostrils daily as needed for allergies.  04/02/19  Yes [provider]  gabapentin (NEURONTIN) 100 MG capsule Take 1 capsule (100 mg total) by mouth at bedtime. 05/27/22  Yes Lyndal Pulley, DO  gabapentin (NEURONTIN) 100 MG capsule Take 1 capsule (100 mg total) by mouth at bedtime. 05/27/22  Yes Lyndal Pulley, DO  hydrocortisone 1 % ointment Apply  1 application topically 3 (three) times daily. Patient taking differently: Apply 1 application  topically 3 (three) times daily as needed (mosquito bite). 09/26/18  Yes Esterwood, Amy S, PA-C  hyoscyamine (LEVSIN SL) 0.125 MG SL tablet DISSOLVE 1 TABLET IN MOUTH EVERY 6 HOURS AS NEEDED FOR CRAMPS 05/07/21  Yes Shereese Bonnie, Venia Minks, MD  ibuprofen (ADVIL) 400 MG tablet Take 1 tablet (400 mg total) by mouth every 6 (six) hours as needed for moderate pain or mild pain. 12/22/21  Yes Carlisle Cater, PA-C  loratadine (CLARITIN) 10 MG tablet Take 10 mg by mouth daily.   Yes [provider]  metFORMIN (GLUCOPHAGE-XR) 500 MG 24 hr tablet Take 500 mg by mouth daily.  07/23/19  Yes [provider]  metoprolol succinate (TOPROL XL) 50 MG 24 hr tablet Take 1 tablet (50 mg total) by mouth daily. Take with or immediately following a meal. 12/29/12  Yes Mabe, Forbes Cellar, MD  Multiple Vitamins-Minerals (ICAPS) CAPS Take 1 capsule by mouth 2 (two) times daily.    Yes [provider]  omeprazole (PRILOSEC) 20 MG capsule Take 1 capsule (20 mg total) by mouth 2 (two) times daily before a meal. 09/08/18  Yes Wendi Lastra, Venia Minks, MD  Polyethyl Glycol-Propyl Glycol (SYSTANE) 0.4-0.3 % SOLN Place 1 drop into both eyes 2 (two) times daily.   Yes [provider]  RESTASIS 0.05 % ophthalmic emulsion Place 1 drop into both eyes every 12 (twelve) hours. 06/28/19  Yes [provider]  tamsulosin (FLOMAX) 0.4 MG CAPS capsule Take 1 capsule (0.4 mg total) by mouth daily. 12/22/21  Yes Carlisle Cater, PA-C  Vitamin D, Cholecalciferol, 400 units TABS Take 400 Units by mouth daily.    Yes [provider]  vitamin E 180 MG (400 UNITS) capsule Take 400 Units by mouth daily.   Yes [provider]  ondansetron (ZOFRAN-ODT) 4 MG disintegrating tablet Take 1 tablet (4 mg total) by mouth every 8 (eight) hours as needed for nausea or vomiting. 12/22/21   Carlisle Cater, PA-C  oxyCODONE (OXY IR/ROXICODONE) 5 MG immediate release tablet Take 0.5-1 tablets (2.5-5 mg total) by mouth every 6 (six) hours as needed for severe pain. 12/22/21   Carlisle Cater, PA-C  valACYclovir (VALTREX) 500 MG tablet Take one tablet twice daily for 3-5 days as needed with an outbreak, then daily as needed Patient taking differently: Take 500 mg by mouth 2 (two) times daily as needed (outbreak). 03/07/19   [name], NP    Current Outpatient Medications  Medication Sig Dispense Refill   ACCU-CHEK GUIDE test strip USE TO CHECK BLOOD SUGAR TWO TO THREE TIMES A WEEK AS DIRECTED     acetaminophen (TYLENOL) 500 MG tablet Take 1,000 mg by mouth every 6 (six) hours as needed (for pain.).      aspirin EC 81 MG tablet Take 81 mg by mouth every evening.      atorvastatin (LIPITOR) 20 MG tablet Take 20 mg by mouth daily.      betamethasone valerate ointment (VALISONE) 0.1 % Use a pea sized amount topically BID for up to one week as needed. 30 g 0   buPROPion (WELLBUTRIN XL) 150 MG 24 hr tablet Take 150 mg by mouth daily.     Diaphragm Arc-Spring (CAYA) [place] Place 1 Device vaginally as needed. 10 each 1   diclofenac Sodium (VOLTAREN) 1 % GEL Apply 2 g topically daily as needed (pain).     fluticasone (FLONASE) 50 MCG/ACT nasal spray Place  1 spray into both nostrils daily as needed for allergies.      gabapentin (NEURONTIN) 100 MG capsule Take 1 capsule (100 mg total) by mouth at bedtime. 30 capsule 3   gabapentin (NEURONTIN) 100 MG capsule Take 1 capsule (100 mg total) by mouth at bedtime. 90 capsule 0   hydrocortisone 1 % ointment Apply 1 application topically 3 (three) times daily. (Patient taking differently: Apply 1 application  topically 3 (three) times daily as needed (mosquito bite).) 30 g 0   hyoscyamine (LEVSIN SL) 0.125 MG SL tablet DISSOLVE 1 TABLET IN MOUTH EVERY 6 HOURS AS NEEDED FOR CRAMPS 120 tablet 0   ibuprofen (ADVIL) 400 MG tablet Take 1 tablet (400 mg total) by mouth every 6 (six) hours as needed for moderate pain or mild pain. 20 tablet 0   loratadine (CLARITIN) 10 MG tablet Take 10 mg by mouth daily.     metFORMIN (GLUCOPHAGE-XR) 500 MG 24 hr tablet Take 500 mg by mouth daily.      metoprolol succinate (TOPROL XL) 50 MG 24 hr tablet Take 1 tablet (50 mg total) by mouth daily. Take with or immediately following a meal. 30 tablet 0   Multiple Vitamins-Minerals (ICAPS) CAPS Take 1 capsule by mouth 2 (two) times daily.      omeprazole (PRILOSEC) 20 MG capsule Take 1 capsule (20 mg total) by mouth 2 (two) times daily before a meal. 180 capsule 3   Polyethyl Glycol-Propyl Glycol (SYSTANE) 0.4-0.3 % SOLN Place 1 drop into both eyes 2 (two) times daily.     RESTASIS 0.05 %  ophthalmic emulsion Place 1 drop into both eyes every 12 (twelve) hours.     tamsulosin (FLOMAX) 0.4 MG CAPS capsule Take 1 capsule (0.4 mg total) by mouth daily. 7 capsule 0   Vitamin D, Cholecalciferol, 400 units TABS Take 400 Units by mouth daily.      vitamin E 180 MG (400 UNITS) capsule Take 400 Units by mouth daily.     ondansetron (ZOFRAN-ODT) 4 MG disintegrating tablet Take 1 tablet (4 mg total) by mouth every 8 (eight) hours as needed for nausea or vomiting. 10 tablet 0   oxyCODONE (OXY IR/ROXICODONE) 5 MG immediate release tablet Take 0.5-1 tablets (2.5-5 mg total) by mouth every 6 (six) hours as needed for severe pain. 8 tablet 0   valACYclovir (VALTREX) 500 MG tablet Take one tablet twice daily for 3-5 days as needed with an outbreak, then daily as needed (Patient taking differently: Take 500 mg by mouth 2 (two) times daily as needed (outbreak).) 30 tablet 12   Current Facility-Administered Medications  Medication Dose Route Frequency Provider Last Rate Last Admin   0.9 %  sodium chloride infusion  500 mL Intravenous Once Mauri Pole, MD        Allergies as of 07/06/2022 - Review Complete 07/06/2022  Allergen Reaction Noted   Ciprofloxacin Rash 09/04/2009   Morphine and related Itching 05/07/2011   Prozac [fluoxetine]  02/04/2021   Zoloft [sertraline hcl]  02/04/2021    Family History  Problem Relation Age of Onset   Diabetes Mother    Diabetes Father    Colon cancer Father    Heart disease Father    Hypertension Father    Diabetes Sister    Hypertension Sister    Diabetes Brother    Colon polyps Brother    Prostate cancer Brother    Breast cancer Paternal Aunt        Age 77's  Colon polyps Brother    Heart disease Brother    Stroke Brother    Hyperlipidemia Brother    Colon polyps Sister    Diabetes Sister    Hypertension Sister    Heart disease Sister    Esophageal cancer Neg Hx    Rectal cancer Neg Hx    Stomach cancer Neg Hx     Social History    Socioeconomic History   Marital status: Married    Spouse name: Not on file   Number of children: 2   Years of education: Not on file   Highest education level: Not on file  Occupational History   Occupation: Retired    Fish farm manager: RETIRED  Tobacco Use   Smoking status: Former   Smokeless tobacco: Never  Scientific laboratory technician Use: Never used  Substance and Sexual Activity   Alcohol use: Not Currently    Comment: rare   Drug use: No   Sexual activity: Not Currently    Birth control/protection: Surgical  Other Topics Concern   Not on file  Social History Narrative   Denies caffeine use.   Social Determinants of Health   Financial Resource Strain: Not on file  Food Insecurity: Not on file  Transportation Needs: Not on file  Physical Activity: Not on file  Stress: Not on file  Social Connections: Not on file  Intimate Partner Violence: Not on file    Review of Systems:  All other review of systems negative except as mentioned in the HPI.  Physical Exam: Vital signs in last 24 hours: BP 124/60   Pulse 87   Temp 98.4 F (36.9 C)   Ht '5\' 2"'$  (1.575 m)   Wt 134 lb (60.8 kg)   SpO2 97%   BMI 24.51 kg/m  General:   Alert, NAD Lungs:  Clear .   Heart:  Regular rate and rhythm Abdomen:  Soft, nontender and nondistended. Neuro/Psych:  Alert and cooperative. Normal mood and affect. A and O x 3  Reviewed labs, radiology imaging, old records and pertinent past GI work up  Patient is appropriate for planned procedure(s) and anesthesia in an ambulatory setting   K. Denzil Magnuson , MD [phone]

## 2022-07-07 ENCOUNTER — Telehealth: Payer: Self-pay | Admitting: *Deleted

## 2022-07-07 ENCOUNTER — Telehealth: Payer: Self-pay | Admitting: Gastroenterology

## 2022-07-07 NOTE — Telephone Encounter (Signed)
  Follow up Call-     07/06/2022    8:18 AM  Call back number  Post procedure Call Back phone  # [phone]  Permission to leave phone message Yes     Patient questions:  Do you have a fever, pain , or abdominal swelling? No. Pain Score  0 *  Have you tolerated food without any problems? Yes.    Have you been able to return to your normal activities? Yes.    Do you have any questions about your discharge instructions: Diet   No. Medications  No. Follow up visit  No.  Do you have questions or concerns about your Care? No.  Actions: * If pain score is 4 or above: No action needed, pain <4.

## 2022-07-07 NOTE — Progress Notes (Unsigned)
[name] [address] [place] Phone: [phone] Subjective:    I'm seeing this patient by the request  of:  Marda Stalker, PA-C  CC: Low back pain follow-up  [id number]  05/27/2022 Patient is having some more thoracic back pain in the thoracolumbar junction.  Known to have low back pain and does have some tightness noted on exam today.  Patient has responded to epidurals in the lumbar spine previously due to patient having more of a tingling sensation and being up higher and we did discuss different treatment options.  New x-rays ordered today as well as we will get laboratory work-up to make sure nothing else systemic is giving patient some symptoms.  Home exercises given today.  Discussed physical therapy which patient declined.  Follow-up again in 6 weeks otherwise.  Patient knows if any worsening symptoms to seek medical attention immediately.   Update 07/08/2022 Kristen Mahoney is a 76 y.o. female coming in with complaint of T and L spine pain. Patient states that she feels that she is the same as last visit. Has tried HEP intermittently. Would like clarification on gabapentin usage. Using '100mg'$  at night prn.   Xray T spine 05/27/2022 IMPRESSION: 1. No radiographic evidence of acute fracture or traumatic malalignment. Cross-sectional imaging could provide more sensitive evaluation. 2. Moderate thoracic and severe lumbar multilevel degenerative change. 3. Reverse S-shaped thoracolumbar curvature.  Xray L spine 05/27/2022 IMPRESSION: 1. No radiographic evidence of acute fracture or traumatic malalignment. Cross-sectional imaging could provide more sensitive evaluation. 2. Moderate thoracic and severe lumbar multilevel degenerative change. 3. Reverse S-shaped thoracolumbar curvature.         Past Medical History:  Diagnosis Date   Allergy    seasonal   Anxiety    Arthritis    knees,all over   Basal cell carcinoma     Benign breast cyst in female    PATIENT HAS HISTORY OF BREAST CYSTS   Cataract    beginning bilateral   Diabetes mellitus    GERD (gastroesophageal reflux disease)    Headache    Herpes progenitalis    Hyperlipidemia    Hypertension    Kidney stone    Kidney stones    OSA on CPAP    Sleep apnea    cpap   Urinary, incontinence, stress female    Vertigo    Past Surgical History:  Procedure Laterality Date   ABDOMINAL HYSTERECTOMY  1986   leiomyomata   Basal cell excised     breast cystectomy     right   CHOLECYSTECTOMY  2003   COLONOSCOPY     CYSTOSCOPY/URETEROSCOPY/HOLMIUM LASER/STENT PLACEMENT Right 01/12/2022   Procedure: CYSTOSCOPY/RETROGRADE/ URETEROSCOPY/HOLMIUM LASER/STENT PLACEMENT;  Surgeon: Robley Fries, MD;  Location: [facility];  Service: Urology;  Laterality: Right;   ear cystectomy     ESOPHAGOGASTRODUODENOSCOPY     KNEE ARTHROSCOPY     right   LITHOTRIPSY     RECTOVAGINAL FISTULA CLOSURE     WISDOM TOOTH EXTRACTION     Social History   Socioeconomic History   Marital status: Married    Spouse name: Not on file   Number of children: 2   Years of education: Not on file   Highest education level: Not on file  Occupational History   Occupation: Retired    Fish farm manager: RETIRED  Tobacco Use   Smoking status: Former   Smokeless tobacco: Never  Scientific laboratory technician Use: Never used  Substance and Sexual Activity   Alcohol use: Not Currently    Comment: rare   Drug use: No   Sexual activity: Not Currently    Birth control/protection: Surgical  Other Topics Concern   Not on file  Social History Narrative   Denies caffeine use.   Social Determinants of Health   Financial Resource Strain: Not on file  Food Insecurity: Not on file  Transportation Needs: Not on file  Physical Activity: Not on file  Stress: Not on file  Social Connections: Not on file   Allergies  Allergen Reactions   Ciprofloxacin Rash   Morphine And Related Itching   Prozac  [Fluoxetine]     Other reaction(s): sweats, jittery   Zoloft [Sertraline Hcl]     Other reaction(s): sweating, jittery   Family History  Problem Relation Age of Onset   Diabetes Mother    Diabetes Father    Colon cancer Father    Heart disease Father    Hypertension Father    Diabetes Sister    Hypertension Sister    Diabetes Brother    Colon polyps Brother    Prostate cancer Brother    Breast cancer Paternal Aunt        Age 39's   Colon polyps Brother    Heart disease Brother    Stroke Brother    Hyperlipidemia Brother    Colon polyps Sister    Diabetes Sister    Hypertension Sister    Heart disease Sister    Esophageal cancer Neg Hx    Rectal cancer Neg Hx    Stomach cancer Neg Hx     Current Outpatient Medications (Endocrine & Metabolic):    metFORMIN (GLUCOPHAGE-XR) 500 MG 24 hr tablet, Take 500 mg by mouth daily.   Current Outpatient Medications (Cardiovascular):    atorvastatin (LIPITOR) 20 MG tablet, Take 20 mg by mouth daily.    metoprolol succinate (TOPROL XL) 50 MG 24 hr tablet, Take 1 tablet (50 mg total) by mouth daily. Take with or immediately following a meal.  Current Outpatient Medications (Respiratory):    fluticasone (FLONASE) 50 MCG/ACT nasal spray, Place 1 spray into both nostrils daily as needed for allergies.    loratadine (CLARITIN) 10 MG tablet, Take 10 mg by mouth daily.  Current Outpatient Medications (Analgesics):    acetaminophen (TYLENOL) 500 MG tablet, Take 1,000 mg by mouth every 6 (six) hours as needed (for pain.).   aspirin EC 81 MG tablet, Take 81 mg by mouth every evening.    ibuprofen (ADVIL) 400 MG tablet, Take 1 tablet (400 mg total) by mouth every 6 (six) hours as needed for moderate pain or mild pain.   oxyCODONE (OXY IR/ROXICODONE) 5 MG immediate release tablet, Take 0.5-1 tablets (2.5-5 mg total) by mouth every 6 (six) hours as needed for severe pain.   Current Outpatient Medications (Other):    ACCU-CHEK GUIDE test strip,  USE TO CHECK BLOOD SUGAR TWO TO THREE TIMES A WEEK AS DIRECTED   betamethasone valerate ointment (VALISONE) 0.1 %, Use a pea sized amount topically BID for up to one week as needed.   buPROPion (WELLBUTRIN XL) 150 MG 24 hr tablet, Take 150 mg by mouth daily.   Diaphragm Arc-Spring (CAYA) DPRH, Place 1 Device vaginally as needed.   diclofenac Sodium (VOLTAREN) 1 % GEL, Apply 2 g topically daily as needed (pain).   gabapentin (NEURONTIN) 100 MG capsule, Take 1 capsule (100 mg total) by mouth at bedtime.   gabapentin (NEURONTIN)  100 MG capsule, Take 1 capsule (100 mg total) by mouth at bedtime.   hydrocortisone 1 % ointment, Apply 1 application topically 3 (three) times daily. (Patient taking differently: Apply 1 application  topically 3 (three) times daily as needed (mosquito bite).)   hyoscyamine (LEVSIN SL) 0.125 MG SL tablet, DISSOLVE 1 TABLET IN MOUTH EVERY 6 HOURS AS NEEDED FOR CRAMPS   Multiple Vitamins-Minerals (ICAPS) CAPS, Take 1 capsule by mouth 2 (two) times daily.    omeprazole (PRILOSEC) 20 MG capsule, Take 1 capsule (20 mg total) by mouth 2 (two) times daily before a meal.   ondansetron (ZOFRAN-ODT) 4 MG disintegrating tablet, Take 1 tablet (4 mg total) by mouth every 8 (eight) hours as needed for nausea or vomiting.   Polyethyl Glycol-Propyl Glycol (SYSTANE) 0.4-0.3 % SOLN, Place 1 drop into both eyes 2 (two) times daily.   RESTASIS 0.05 % ophthalmic emulsion, Place 1 drop into both eyes every 12 (twelve) hours.   tamsulosin (FLOMAX) 0.4 MG CAPS capsule, Take 1 capsule (0.4 mg total) by mouth daily.   valACYclovir (VALTREX) 500 MG tablet, Take one tablet twice daily for 3-5 days as needed with an outbreak, then daily as needed (Patient taking differently: Take 500 mg by mouth 2 (two) times daily as needed (outbreak).)   Vitamin D, Cholecalciferol, 400 units TABS, Take 400 Units by mouth daily.    vitamin E 180 MG (400 UNITS) capsule, Take 400 Units by mouth daily.   Reviewed prior  external information including notes and imaging from  primary care provider As well as notes that were available from care everywhere and other healthcare systems.  Past medical history, social, surgical and family history all reviewed in electronic medical record.  No pertanent information unless stated regarding to the chief complaint.   Review of Systems:  No headache, visual changes, nausea, vomiting, diarrhea, constipation, dizziness, abdominal pain, skin rash, fevers, chills, night sweats, weight loss, swollen lymph nodes, , joint swelling, chest pain, shortness of breath, mood changes. POSITIVE muscle aches, body aches  Objective  Blood pressure 114/68, pulse 64, height '5\' 2"'$  (1.575 m), weight 131 lb (59.4 kg), SpO2 98 %.   General: No apparent distress alert and oriented x3 mood and affect normal, dressed appropriately.  HEENT: Pupils equal, extraocular movements intact  Respiratory: Patient's speak in full sentences and does not appear short of breath  Cardiovascular: No lower extremity edema, non tender, no erythema  Antalgic gait noted. Patient does have scoliosis noted of the back.  Tightness with FABER test bilaterally.  4 out of 5 strength of the lower extremities.  Neurovascular intact distally.    Impression and Recommendations:

## 2022-07-08 ENCOUNTER — Ambulatory Visit (INDEPENDENT_AMBULATORY_CARE_PROVIDER_SITE_OTHER): Payer: Medicare Other | Admitting: Family Medicine

## 2022-07-08 ENCOUNTER — Other Ambulatory Visit: Payer: Self-pay

## 2022-07-08 ENCOUNTER — Encounter: Payer: Self-pay | Admitting: Family Medicine

## 2022-07-08 DIAGNOSIS — M546 Pain in thoracic spine: Secondary | ICD-10-CM

## 2022-07-08 DIAGNOSIS — G8929 Other chronic pain: Secondary | ICD-10-CM | POA: Diagnosis not present

## 2022-07-08 NOTE — Patient Instructions (Signed)
Use gabapentin on regular basis Do exercises but we can do PT if needed Can do injections See me in 2-3 months

## 2022-07-08 NOTE — Assessment & Plan Note (Signed)
Thoracic and lumbar back pain.  Does have the scoliosis.  Discussed the possibility of formal physical therapy which patient declined.  Discussed with patient about which activities to do and which ones to avoid.  We discussed that advanced imaging of the questionable area epidurals could be also helpful if necessary.  Patient wants to continue with conservative therapy and home exercises at the moment.  Encouraged her to take the gabapentin on a more regular basis with patient being somewhat noncompliant.  See how patient responds and follow-up again in 2 months.  Total time reviewing chart as well as discussing with patient 33 minutes

## 2022-07-14 ENCOUNTER — Encounter: Payer: Self-pay | Admitting: Gastroenterology

## 2022-07-20 ENCOUNTER — Other Ambulatory Visit: Payer: Self-pay | Admitting: Gastroenterology

## 2022-07-20 ENCOUNTER — Telehealth: Payer: Self-pay | Admitting: Family Medicine

## 2022-07-20 ENCOUNTER — Telehealth: Payer: Self-pay | Admitting: Gastroenterology

## 2022-07-20 NOTE — Telephone Encounter (Signed)
Patient called stating that she is having really bad pain on the right upper side of her hip. She asked if there was anything that Dr Tamala Julian would recommend? She is leaving to go out of town tomorrow afternoon and was hoping to have some relief.  Please advise.

## 2022-07-20 NOTE — Telephone Encounter (Signed)
Patient complains of rectal itching. She states she has not had any rectal bleeding. She is using Miralax PRN for hard stools. Patient will try Recticare and Prep H for her symptoms.  Complains of right hip pain into her low back. She denies injury. States she has known arthritis. She will contact her managing provider about this pain.

## 2022-07-20 NOTE — Telephone Encounter (Signed)
Left message for patient to call back for recommendations.  

## 2022-07-20 NOTE — Telephone Encounter (Signed)
Patinet called said she thinks she has hemorrhoids it burns and itches really bad and she will be leaving fr a trip on Thursday seeking some type of relief.

## 2022-07-20 NOTE — Telephone Encounter (Signed)
Ice 20 minutes 2 times daily. Usually after activity and before bed. Voltaren topically 2 times a day as well  Maybe oral anti-inflammatories 3 times a day for 3 days

## 2022-07-21 NOTE — Telephone Encounter (Signed)
Discussed w pt

## 2022-07-21 NOTE — Telephone Encounter (Signed)
Left message for patient to call back  

## 2022-08-04 NOTE — Telephone Encounter (Signed)
Inbound call from patient requesting to speak with a nurse in regarding Hemorid. Patient states her medication is not working. Please give a call to further advise.  Thank you

## 2022-08-05 NOTE — Telephone Encounter (Signed)
Patient complains of dry hard stool. Stopped Miralax because she developed loose stools. She will resume Miralax daily up to twice daily until she is having normal bowel movements. Instructed to adjust the Miralax to her needs with soft stool being the goal. Stop if she develops diarrhea, then resume when stools become firm.  She also complains of rectal discomfort. She is apply Vasoline to rectal area. She stopped using Preparation H hemorrhoidal care because she felt it was not doing any good. The formula she has does not have hydrocortisone in it.  Patient is asking for suppositories.

## 2022-08-09 DIAGNOSIS — I10 Essential (primary) hypertension: Secondary | ICD-10-CM | POA: Diagnosis not present

## 2022-08-09 DIAGNOSIS — E119 Type 2 diabetes mellitus without complications: Secondary | ICD-10-CM | POA: Diagnosis not present

## 2022-08-09 DIAGNOSIS — E785 Hyperlipidemia, unspecified: Secondary | ICD-10-CM | POA: Diagnosis not present

## 2022-08-09 DIAGNOSIS — R0789 Other chest pain: Secondary | ICD-10-CM | POA: Diagnosis not present

## 2022-08-12 DIAGNOSIS — Z1231 Encounter for screening mammogram for malignant neoplasm of breast: Secondary | ICD-10-CM | POA: Diagnosis not present

## 2022-08-17 ENCOUNTER — Encounter: Payer: Self-pay | Admitting: Nurse Practitioner

## 2022-09-07 NOTE — Progress Notes (Signed)
Kristen Mahoney [address] [place] Phone: [phone] Subjective:    I'm seeing this patient by the request  of:  Marda Stalker, PA-C  CC: Low back pain  [id number]  07/08/2022 Thoracic and lumbar back pain.  Does have the scoliosis.  Discussed the possibility of formal physical therapy which patient declined.  Discussed with patient about which activities to do and which ones to avoid.  We discussed that advanced imaging of the questionable area epidurals could be also helpful if necessary.  Patient wants to continue with conservative therapy and home exercises at the moment.  Encouraged her to take the gabapentin on a more regular basis with patient being somewhat noncompliant.  See how patient responds and follow-up again in 2 months.  Total time reviewing chart as well as discussing with patient 33 minutes  Update 09/08/2022 Kristen Mahoney is a 76 y.o. female coming in with complaint of thoracic and R sided lumbar spine pain. Patient states that she has been having good and bad days. Would like to discuss B12, Turmeric, and Glucosamine.      Past Medical History:  Diagnosis Date   Allergy    seasonal   Anxiety    Arthritis    knees,all over   Basal cell carcinoma    Benign breast cyst in female    PATIENT HAS HISTORY OF BREAST CYSTS   Cataract    beginning bilateral   Diabetes mellitus    GERD (gastroesophageal reflux disease)    Headache    Herpes progenitalis    Hyperlipidemia    Hypertension    Kidney stone    Kidney stones    OSA on CPAP    Sleep apnea    cpap   Urinary, incontinence, stress female    Vertigo    Past Surgical History:  Procedure Laterality Date   ABDOMINAL HYSTERECTOMY  1986   leiomyomata   Basal cell excised     breast cystectomy     right   CHOLECYSTECTOMY  2003   COLONOSCOPY     CYSTOSCOPY/URETEROSCOPY/HOLMIUM LASER/STENT PLACEMENT Right 01/12/2022   Procedure: CYSTOSCOPY/RETROGRADE/  URETEROSCOPY/HOLMIUM LASER/STENT PLACEMENT;  Surgeon: Robley Fries, MD;  Location: [facility];  Service: Urology;  Laterality: Right;   ear cystectomy     ESOPHAGOGASTRODUODENOSCOPY     KNEE ARTHROSCOPY     right   LITHOTRIPSY     RECTOVAGINAL FISTULA CLOSURE     WISDOM TOOTH EXTRACTION     Social History   Socioeconomic History   Marital status: Married    Spouse name: Not on file   Number of children: 2   Years of education: Not on file   Highest education level: Not on file  Occupational History   Occupation: Retired    Fish farm manager: RETIRED  Tobacco Use   Smoking status: Former   Smokeless tobacco: Never  Scientific laboratory technician Use: Never used  Substance and Sexual Activity   Alcohol use: Not Currently    Comment: rare   Drug use: No   Sexual activity: Not Currently    Birth control/protection: Surgical  Other Topics Concern   Not on file  Social History Narrative   Denies caffeine use.   Social Determinants of Health   Financial Resource Strain: Not on file  Food Insecurity: Not on file  Transportation Needs: Not on file  Physical Activity: Not on file  Stress: Not on file  Social Connections: Not on file  Allergies  Allergen Reactions   Ciprofloxacin Rash   Morphine And Related Itching   Prozac [Fluoxetine]     Other reaction(s): sweats, jittery   Zoloft [Sertraline Hcl]     Other reaction(s): sweating, jittery   Family History  Problem Relation Age of Onset   Diabetes Mother    Diabetes Father    Colon cancer Father    Heart disease Father    Hypertension Father    Diabetes Sister    Hypertension Sister    Diabetes Brother    Colon polyps Brother    Prostate cancer Brother    Breast cancer Paternal 32        Age 102's   Colon polyps Brother    Heart disease Brother    Stroke Brother    Hyperlipidemia Brother    Colon polyps Sister    Diabetes Sister    Hypertension Sister    Heart disease Sister    Esophageal cancer Neg Hx    Rectal  cancer Neg Hx    Stomach cancer Neg Hx     Current Outpatient Medications (Endocrine & Metabolic):    metFORMIN (GLUCOPHAGE-XR) 500 MG 24 hr tablet, Take 500 mg by mouth daily.   Current Outpatient Medications (Cardiovascular):    atorvastatin (LIPITOR) 20 MG tablet, Take 20 mg by mouth daily.    metoprolol succinate (TOPROL XL) 50 MG 24 hr tablet, Take 1 tablet (50 mg total) by mouth daily. Take with or immediately following a meal.  Current Outpatient Medications (Respiratory):    fluticasone (FLONASE) 50 MCG/ACT nasal spray, Place 1 spray into both nostrils daily as needed for allergies.    loratadine (CLARITIN) 10 MG tablet, Take 10 mg by mouth daily.  Current Outpatient Medications (Analgesics):    acetaminophen (TYLENOL) 500 MG tablet, Take 1,000 mg by mouth every 6 (six) hours as needed (for pain.).   aspirin EC 81 MG tablet, Take 81 mg by mouth every evening.    ibuprofen (ADVIL) 400 MG tablet, Take 1 tablet (400 mg total) by mouth every 6 (six) hours as needed for moderate pain or mild pain.   oxyCODONE (OXY IR/ROXICODONE) 5 MG immediate release tablet, Take 0.5-1 tablets (2.5-5 mg total) by mouth every 6 (six) hours as needed for severe pain.   Current Outpatient Medications (Other):    ACCU-CHEK GUIDE test strip, USE TO CHECK BLOOD SUGAR TWO TO THREE TIMES A WEEK AS DIRECTED   betamethasone valerate ointment (VALISONE) 0.1 %, Use a pea sized amount topically BID for up to one week as needed.   buPROPion (WELLBUTRIN XL) 150 MG 24 hr tablet, Take 150 mg by mouth daily.   Diaphragm Arc-Spring (CAYA) DPRH, Place 1 Device vaginally as needed.   diclofenac Sodium (VOLTAREN) 1 % GEL, Apply 2 g topically daily as needed (pain).   gabapentin (NEURONTIN) 100 MG capsule, Take 1 capsule (100 mg total) by mouth at bedtime.   gabapentin (NEURONTIN) 100 MG capsule, Take 1 capsule (100 mg total) by mouth at bedtime.   hydrocortisone 1 % ointment, Apply 1 application topically 3 (three) times  daily. (Patient taking differently: Apply 1 application  topically 3 (three) times daily as needed (mosquito bite).)   hyoscyamine (LEVSIN SL) 0.125 MG SL tablet, Take 1 tablet (0.125 mg total) by mouth 2 (two) times daily as needed. DISSOLVE 1 TABLET IN MOUTH EVERY 6 HOURS AS NEEDED FOR CRAMPS   Multiple Vitamins-Minerals (ICAPS) CAPS, Take 1 capsule by mouth 2 (two) times daily.  omeprazole (PRILOSEC) 20 MG capsule, Take 1 capsule (20 mg total) by mouth 2 (two) times daily before a meal.   ondansetron (ZOFRAN-ODT) 4 MG disintegrating tablet, Take 1 tablet (4 mg total) by mouth every 8 (eight) hours as needed for nausea or vomiting.   Polyethyl Glycol-Propyl Glycol (SYSTANE) 0.4-0.3 % SOLN, Place 1 drop into both eyes 2 (two) times daily.   RESTASIS 0.05 % ophthalmic emulsion, Place 1 drop into both eyes every 12 (twelve) hours.   tamsulosin (FLOMAX) 0.4 MG CAPS capsule, Take 1 capsule (0.4 mg total) by mouth daily.   valACYclovir (VALTREX) 500 MG tablet, Take one tablet twice daily for 3-5 days as needed with an outbreak, then daily as needed (Patient taking differently: Take 500 mg by mouth 2 (two) times daily as needed (outbreak).)   Vitamin D, Cholecalciferol, 400 units TABS, Take 400 Units by mouth daily.    vitamin E 180 MG (400 UNITS) capsule, Take 400 Units by mouth daily.   Reviewed prior external information including notes and imaging from  primary care provider As well as notes that were available from care everywhere and other healthcare systems.  Past medical history, social, surgical and family history all reviewed in electronic medical record.  No pertanent information unless stated regarding to the chief complaint.   Review of Systems:  No headache, visual changes, nausea, vomiting, diarrhea, constipation, dizziness, abdominal pain, skin rash, fevers, chills, night sweats, weight loss, swollen lymph nodes, body aches, joint swelling, chest pain, shortness of breath, mood  changes. POSITIVE muscle aches  Objective  Blood pressure 122/72, pulse 81, height '5\' 2"'$  (1.575 m), weight 130 lb (59 kg).   General: No apparent distress alert and oriented x3 mood and affect normal, dressed appropriately.  HEENT: Pupils equal, extraocular movements intact  Respiratory: Patient's speak in full sentences and does not appear short of breath  Cardiovascular: No lower extremity edema, non tender, no erythema  Low back does have significant loss of lordosis.  Some degenerative scoliosis noted.  Tightness with FABER test bilaterally.  Patient does have atrophy of the lower extremities bilaterally.  Dorsalis pedis pulses 1+ though.  Patient does ambulate relatively well but short strides with her gait.    Impression and Recommendations:

## 2022-09-08 ENCOUNTER — Ambulatory Visit (INDEPENDENT_AMBULATORY_CARE_PROVIDER_SITE_OTHER): Payer: Medicare Other | Admitting: Family Medicine

## 2022-09-08 ENCOUNTER — Ambulatory Visit (INDEPENDENT_AMBULATORY_CARE_PROVIDER_SITE_OTHER): Payer: Medicare Other

## 2022-09-08 VITALS — BP 122/72 | HR 81 | Ht 62.0 in | Wt 130.0 lb

## 2022-09-08 DIAGNOSIS — G8929 Other chronic pain: Secondary | ICD-10-CM

## 2022-09-08 DIAGNOSIS — M25561 Pain in right knee: Secondary | ICD-10-CM

## 2022-09-08 DIAGNOSIS — M5136 Other intervertebral disc degeneration, lumbar region: Secondary | ICD-10-CM

## 2022-09-08 DIAGNOSIS — M25562 Pain in left knee: Secondary | ICD-10-CM

## 2022-09-08 NOTE — Patient Instructions (Signed)
MRI lumbar spine [phone] Vitamins: Start one at a time for one week to see if you have side effects We will be in touch once we have the MRI

## 2022-09-09 ENCOUNTER — Other Ambulatory Visit: Payer: Self-pay | Admitting: Family Medicine

## 2022-09-09 ENCOUNTER — Other Ambulatory Visit: Payer: Medicare Other

## 2022-09-09 ENCOUNTER — Encounter: Payer: Self-pay | Admitting: Family Medicine

## 2022-09-09 ENCOUNTER — Telehealth: Payer: Self-pay | Admitting: Family Medicine

## 2022-09-09 DIAGNOSIS — M51369 Other intervertebral disc degeneration, lumbar region without mention of lumbar back pain or lower extremity pain: Secondary | ICD-10-CM | POA: Insufficient documentation

## 2022-09-09 DIAGNOSIS — M5136 Other intervertebral disc degeneration, lumbar region: Secondary | ICD-10-CM | POA: Insufficient documentation

## 2022-09-09 NOTE — Telephone Encounter (Signed)
Patient was seen yesterday and Dr Tamala Julian mentioned ordering an MRI. Can this be put in for her please? I did not see it in her chart.

## 2022-09-09 NOTE — Telephone Encounter (Signed)
Placed order, contact patient

## 2022-09-09 NOTE — Assessment & Plan Note (Signed)
Patient does have atrophy of the lower extremities.  The pain seems to be now more isolated to the lumbar area.  With the severity of arthritic changes noted on the x-rays as well as the atrophy of the lower extremities concern more for patient having worsening spinal stenosis.  Patient states that Kristen Mahoney has had difficulty previously.  Patient has attempted 6 weeks of therapy but has been somewhat noncompliant.  Patient's other health issues do make it difficult to do any significant medications on a regular basis.  We will get repeat MRI to further evaluate with last one being greater than 30 years ago.  Depending on findings would have to discuss if medical therapy is more beneficial for surgical intervention with patient's decreasing quality of life.  Follow-up after imaging to discuss further.

## 2022-09-14 ENCOUNTER — Ambulatory Visit (INDEPENDENT_AMBULATORY_CARE_PROVIDER_SITE_OTHER): Payer: Medicare Other | Admitting: Gastroenterology

## 2022-09-14 ENCOUNTER — Encounter: Payer: Self-pay | Admitting: Gastroenterology

## 2022-09-14 VITALS — BP 124/70 | HR 84 | Ht 62.0 in | Wt 132.2 lb

## 2022-09-14 DIAGNOSIS — M1990 Unspecified osteoarthritis, unspecified site: Secondary | ICD-10-CM

## 2022-09-14 DIAGNOSIS — R151 Fecal smearing: Secondary | ICD-10-CM

## 2022-09-14 DIAGNOSIS — K623 Rectal prolapse: Secondary | ICD-10-CM | POA: Diagnosis not present

## 2022-09-14 DIAGNOSIS — K6289 Other specified diseases of anus and rectum: Secondary | ICD-10-CM

## 2022-09-14 MED ORDER — LIDOCAINE 5 % EX PTCH
1.0000 | MEDICATED_PATCH | CUTANEOUS | 11 refills | Status: DC
Start: 2022-09-14 — End: 2024-10-10

## 2022-09-14 NOTE — Progress Notes (Signed)
Kristen Mahoney    [id number]    07-07-1946  Primary Care Physician:Wharton, Myrtha Mantis  Referring Physician: Marda Stalker, [place],  [place] [address]   Chief complaint: Fecal incontinence, rectal prolapse, irritable bowel syndrome  HPI:  76 year old very pleasant female here for follow-up visit for any sensation and rectal discomfort.  She continues to have intermittent abdominal discomfort.  Has weak anal sphincter with partial rectal prolapse, has mucus discharge and fecal incontinence when she urinates   CT abdomen and pelvis with contrast December 2022 negative for any acute diverticulitis or complications.     She is s/p hysterectomy complicated with vaginal fistula with repair in 2010, incompetent anal sphincter with partial rectal prolapse   She is s/p hemorrhoidal band ligation by Dr. Marcello Moores   Colonoscopy July 06, 2022 - Decreased sphincter tone found on digital rectal exam. - One 2 mm polyp in the cecum, removed with a cold snare. Resected and retrieved. - Severe diverticulosis in the sigmoid colon. There was narrowing of the colon in association with the diverticular opening. There was evidence of diverticular spasm. There was no evidence of diverticular bleeding.    GI Hx: Abdominal/pelvic CT 09/07/2019  Bilateral nephrolithiasis is noted. No hydronephrosis or renal obstruction is noted.  Linear hyperdensity is noted posteriorly in the urinary bladder which most likely represents blood, but possible mass cannot be excluded. Cystoscopy is recommended for further evaluation. Aortic Atherosclerosis    Colonoscopy 10/12/2017 by Dr. Silverio Decamp: Decreased sphincter tone found on digital rectal exam. - Diverticulosis in the sigmoid colon and in the descending colon. - The examination was otherwise normal. - No specimens collected. -Repeat colonoscopy in 5 years for screening purposes.   Colonoscopy 04/25/2013 by Dr. Maurene Capes:  Incompetent rectal sphincter, evidence of prior rectal prolapse    Outpatient Encounter Medications as of 09/14/2022  Medication Sig   ACCU-CHEK GUIDE test strip USE TO CHECK BLOOD SUGAR TWO TO THREE TIMES A WEEK AS DIRECTED   acetaminophen (TYLENOL) 500 MG tablet Take 1,000 mg by mouth every 6 (six) hours as needed (for pain.).   aspirin EC 81 MG tablet Take 81 mg by mouth every evening.    atorvastatin (LIPITOR) 20 MG tablet Take 20 mg by mouth daily.    betamethasone valerate ointment (VALISONE) 0.1 % Use a pea sized amount topically BID for up to one week as needed.   buPROPion (WELLBUTRIN XL) 150 MG 24 hr tablet Take 150 mg by mouth daily.   Diaphragm Arc-Spring (CAYA) [place] Place 1 Device vaginally as needed.   diclofenac Sodium (VOLTAREN) 1 % GEL Apply 2 g topically daily as needed (pain).   fluticasone (FLONASE) 50 MCG/ACT nasal spray Place 1 spray into both nostrils daily as needed for allergies.    gabapentin (NEURONTIN) 100 MG capsule Take 1 capsule (100 mg total) by mouth at bedtime.   gabapentin (NEURONTIN) 100 MG capsule Take 1 capsule (100 mg total) by mouth at bedtime.   hydrocortisone 1 % ointment Apply 1 application topically 3 (three) times daily. (Patient taking differently: Apply 1 application  topically 3 (three) times daily as needed (mosquito bite).)   hyoscyamine (LEVSIN SL) 0.125 MG SL tablet Take 1 tablet (0.125 mg total) by mouth 2 (two) times daily as needed. DISSOLVE 1 TABLET IN MOUTH EVERY 6 HOURS AS NEEDED FOR CRAMPS   ibuprofen (ADVIL) 400 MG tablet Take 1 tablet (400 mg total) by mouth every 6 (  six) hours as needed for moderate pain or mild pain.   loratadine (CLARITIN) 10 MG tablet Take 10 mg by mouth daily.   metFORMIN (GLUCOPHAGE-XR) 500 MG 24 hr tablet Take 500 mg by mouth daily.    metoprolol succinate (TOPROL XL) 50 MG 24 hr tablet Take 1 tablet (50 mg total) by mouth daily. Take with or immediately following a meal.   Multiple Vitamins-Minerals (ICAPS)  CAPS Take 1 capsule by mouth 2 (two) times daily.    omeprazole (PRILOSEC) 20 MG capsule Take 1 capsule (20 mg total) by mouth 2 (two) times daily before a meal.   ondansetron (ZOFRAN-ODT) 4 MG disintegrating tablet Take 1 tablet (4 mg total) by mouth every 8 (eight) hours as needed for nausea or vomiting.   oxyCODONE (OXY IR/ROXICODONE) 5 MG immediate release tablet Take 0.5-1 tablets (2.5-5 mg total) by mouth every 6 (six) hours as needed for severe pain.   Polyethyl Glycol-Propyl Glycol (SYSTANE) 0.4-0.3 % SOLN Place 1 drop into both eyes 2 (two) times daily.   RESTASIS 0.05 % ophthalmic emulsion Place 1 drop into both eyes every 12 (twelve) hours.   tamsulosin (FLOMAX) 0.4 MG CAPS capsule Take 1 capsule (0.4 mg total) by mouth daily.   valACYclovir (VALTREX) 500 MG tablet Take one tablet twice daily for 3-5 days as needed with an outbreak, then daily as needed (Patient taking differently: Take 500 mg by mouth 2 (two) times daily as needed (outbreak).)   Vitamin D, Cholecalciferol, 400 units TABS Take 400 Units by mouth daily.    vitamin E 180 MG (400 UNITS) capsule Take 400 Units by mouth daily.   No facility-administered encounter medications on file as of 09/14/2022.    Allergies as of 09/14/2022 - Review Complete 09/14/2022  Allergen Reaction Noted   Ciprofloxacin Rash 09/04/2009   Morphine and related Itching 05/07/2011   Prozac [fluoxetine]  02/04/2021   Zoloft [sertraline hcl]  02/04/2021    Past Medical History:  Diagnosis Date   Allergy    seasonal   Anxiety    Arthritis    knees,all over   Basal cell carcinoma    Benign breast cyst in female    PATIENT HAS HISTORY OF BREAST CYSTS   Cataract    beginning bilateral   Diabetes mellitus    GERD (gastroesophageal reflux disease)    Headache    Herpes progenitalis    Hyperlipidemia    Hypertension    Kidney stone    Kidney stones    OSA on CPAP    Sleep apnea    cpap   Urinary, incontinence, stress female     Vertigo     Past Surgical History:  Procedure Laterality Date   ABDOMINAL HYSTERECTOMY  1986   leiomyomata   Basal cell excised     breast cystectomy     right   CHOLECYSTECTOMY  2003   COLONOSCOPY     CYSTOSCOPY/URETEROSCOPY/HOLMIUM LASER/STENT PLACEMENT Right 01/12/2022   Procedure: CYSTOSCOPY/RETROGRADE/ URETEROSCOPY/HOLMIUM LASER/STENT PLACEMENT;  Surgeon: Robley Fries, MD;  Location: [facility];  Service: Urology;  Laterality: Right;   ear cystectomy     ESOPHAGOGASTRODUODENOSCOPY     KNEE ARTHROSCOPY     right   LITHOTRIPSY     RECTOVAGINAL FISTULA CLOSURE     WISDOM TOOTH EXTRACTION      Family History  Problem Relation Age of Onset   Diabetes Mother    Diabetes Father    Colon cancer Father    Heart disease Father  Hypertension Father    Diabetes Sister    Hypertension Sister    Diabetes Brother    Colon polyps Brother    Prostate cancer Brother    Breast cancer Paternal Aunt        Age 56's   Colon polyps Brother    Heart disease Brother    Stroke Brother    Hyperlipidemia Brother    Colon polyps Sister    Diabetes Sister    Hypertension Sister    Heart disease Sister    Esophageal cancer Neg Hx    Rectal cancer Neg Hx    Stomach cancer Neg Hx     Social History   Socioeconomic History   Marital status: Married    Spouse name: Not on file   Number of children: 2   Years of education: Not on file   Highest education level: Not on file  Occupational History   Occupation: Retired    Fish farm manager: RETIRED  Tobacco Use   Smoking status: Former   Smokeless tobacco: Never  Scientific laboratory technician Use: Never used  Substance and Sexual Activity   Alcohol use: Not Currently    Comment: rare   Drug use: No   Sexual activity: Not Currently    Birth control/protection: Surgical  Other Topics Concern   Not on file  Social History Narrative   Denies caffeine use.   Social Determinants of Health   Financial Resource Strain: Not on file  Food  Insecurity: Not on file  Transportation Needs: Not on file  Physical Activity: Not on file  Stress: Not on file  Social Connections: Not on file  Intimate Partner Violence: Not on file      Review of systems: All other review of systems negative except as mentioned in the HPI.   Physical Exam: Vitals:   09/14/22 1348  BP: 124/70  Pulse: 84   Body mass index is 24.19 kg/m. Gen:      No acute distress HEENT:  sclera anicteric Abd:      soft, non-tender; no palpable masses, no distension Ext:    No edema Neuro: alert and oriented x 3 Psych: normal mood and affect  Data Reviewed:  Reviewed labs, radiology imaging, old records and pertinent past GI work up   Assessment and Plan/Recommendations:  76 year old very pleasant female with history of left side diverticular disease with left lower quadrant abdominal pain likely secondary to acute diverticulitis, has improved with oral antibiotics. CT abdomen and pelvis with contrast negative for any acute pathology She has incompetent anal sphincter with intermittent fecal incontinence and bright red blood per rectum Continue Benefiber 1 tablespoon 3 times daily with meals Use MiraLAX half capful daily as needed to prevent constipation  Advised patient to use barrier cream like Desitin or diaper rash cream to avoid irritation of the prolapsed rectal mucosa   Use Levsin up to 2 times daily as needed for abdominal cramping and discomfort    Complains of low back pain with sciatica, also complains of worsening scoliosis: She is followed by Dr. Gardenia Phlegm, is using Voltaren cream for arthritis in the knee, advised patient to use lidocaine 5% patch as needed for pain relief   Return in 3 months or sooner if needed  This visit required 30 minutes of patient care (this includes precharting, chart review, review of results, face-to-face time used for counseling as well as treatment plan and follow-up. The patient was provided an  opportunity to ask questions and  all were answered. The patient agreed with the plan and demonstrated an understanding of the instructions.  Damaris Hippo , MD    CC: Marda Stalker, PA-C

## 2022-09-14 NOTE — Patient Instructions (Signed)
We have sent the following medications to your pharmacy for you to pick up at your convenience: Lidocaine patch as needed.  May use [place] or Calmoseptine cream small amount to rectum twice daily as needed.  _______________________________________________________  If you are age 77 or older, your body mass index should be between 23-30. Your Body mass index is 24.19 kg/m. If this is out of the aforementioned range listed, please consider follow up with your Primary Care Provider.  If you are age 54 or younger, your body mass index should be between 19-25. Your Body mass index is 24.19 kg/m. If this is out of the aformentioned range listed, please consider follow up with your Primary Care Provider.   ________________________________________________________  The [facility] providers would like to encourage you to use [facility] to communicate with providers for non-urgent requests or questions.  Due to long hold times on the telephone, sending your provider a message by [facility] may be a faster and more efficient way to get a response.  Please allow 48 business hours for a response.  Please remember that this is for non-urgent requests.  _______________________________________________________

## 2022-09-15 ENCOUNTER — Other Ambulatory Visit (HOSPITAL_COMMUNITY): Payer: Self-pay

## 2022-09-15 ENCOUNTER — Telehealth: Payer: Self-pay

## 2022-09-15 NOTE — Telephone Encounter (Signed)
Received notification from Cover[employer]eds  regarding a prior authorization for Lidocaine.   Authorization has been submitted via Covermymeds.    Key # [id number]

## 2022-09-16 NOTE — Telephone Encounter (Signed)
Patient Advocate Encounter  Received a fax from [facility] regarding Prior Authorization for Lidocaine 5% Patch.   Authorization has been DENIED due to indication not covered. This plan covers Lidocaine 5% patch use for   -Pain associated with post-herpetic neuralgia -Pain associated with diabetic neuropathy -Pain associated with cancer-related neuropathy (including cancer treatment-related neuropathy)  Determination letter has been added to patient chart.  Clista Bernhardt, CPhT Rx Patient Advocate Phone: [phone]

## 2022-09-16 NOTE — Telephone Encounter (Signed)
Informed patient of Dr. Woodward Ku recommendations. Patient verbalized understanding.

## 2022-09-16 NOTE — Telephone Encounter (Signed)
Please advise Dr Nandigam 

## 2022-09-16 NOTE — Telephone Encounter (Signed)
Please advise patient to try to OTC 4% lidocaine patch as insurance denied coverage. Thanks

## 2022-09-20 ENCOUNTER — Ambulatory Visit
Admission: RE | Admit: 2022-09-20 | Discharge: 2022-09-20 | Disposition: A | Discharge status: Home / Self Care | Payer: Medicare Other | Source: Ambulatory Visit | Attending: Family Medicine | Admitting: Family Medicine

## 2022-09-20 DIAGNOSIS — M5136 Other intervertebral disc degeneration, lumbar region: Secondary | ICD-10-CM

## 2022-09-20 DIAGNOSIS — M545 Low back pain, unspecified: Secondary | ICD-10-CM | POA: Diagnosis not present

## 2022-09-20 DIAGNOSIS — M48061 Spinal stenosis, lumbar region without neurogenic claudication: Secondary | ICD-10-CM | POA: Diagnosis not present

## 2022-09-21 ENCOUNTER — Other Ambulatory Visit: Payer: Self-pay

## 2022-09-21 ENCOUNTER — Ambulatory Visit (INDEPENDENT_AMBULATORY_CARE_PROVIDER_SITE_OTHER): Payer: Medicare Other | Admitting: Nurse Practitioner

## 2022-09-21 ENCOUNTER — Encounter: Payer: Self-pay | Admitting: Nurse Practitioner

## 2022-09-21 VITALS — BP 118/72 | HR 79

## 2022-09-21 DIAGNOSIS — A599 Trichomoniasis, unspecified: Secondary | ICD-10-CM

## 2022-09-21 DIAGNOSIS — R350 Frequency of micturition: Secondary | ICD-10-CM

## 2022-09-21 DIAGNOSIS — Z113 Encounter for screening for infections with a predominantly sexual mode of transmission: Secondary | ICD-10-CM

## 2022-09-21 DIAGNOSIS — N898 Other specified noninflammatory disorders of vagina: Secondary | ICD-10-CM

## 2022-09-21 LAB — WET PREP FOR TRICH, YEAST, CLUE

## 2022-09-21 MED ORDER — TINIDAZOLE 500 MG PO TABS: 2.0000 g | ORAL_TABLET | Freq: Once | ORAL | 0 refills | Status: AC

## 2022-09-21 NOTE — Progress Notes (Signed)
   Acute Office Visit  Subjective:    Patient ID: Kristen Mahoney, female    DOB: 03-25-1946, 76 y.o.   MRN: [id number]   HPI 76 y.o. presents today for urinary frequency and dysuria, vaginal/vulvar itching and irritation x 2 days. She has been using lidocaine cream with some relief.    Review of Systems  Constitutional: Negative.   Genitourinary:  Positive for dysuria, frequency, pelvic pain and vaginal pain (Vulvovaginal irritation, itching). Negative for difficulty urinating, flank pain, hematuria and urgency.       Objective:    Physical Exam Constitutional:      Appearance: Normal appearance.  Genitourinary:    General: Normal vulva.     Vagina: Vaginal discharge and erythema present.     BP 118/72   Pulse 79   SpO2 98%  Wt Readings from Last 3 Encounters:  09/14/22 132 lb 4 oz (60 kg)  09/08/22 130 lb (59 kg)  07/08/22 131 lb (59.4 kg)        Patient informed chaperone available to be present for breast and/or pelvic exam. Patient has requested no chaperone to be present. Patient has been advised what will be completed during breast and pelvic exam.   Wet prep + trichomonas UA 1+ leukocytes, 1+ protein, negative nitrites, negative blood, yellow/cloudy. Microscopic: wbc 10-20, rbc 0-2, moderate bacteria  Assessment & Plan:   Problem List Items Addressed This Visit   None Visit Diagnoses     Trichomonas infection    -  Primary   Relevant Medications   tinidazole (TINDAMAX) 500 MG tablet   Vaginal itching       Relevant Orders   WET PREP FOR [place], YEAST, CLUE (Completed)   Urinary frequency       Relevant Orders   Urinalysis,Complete w/RFL Culture (Completed)   Screen for STD (sexually transmitted disease)       Relevant Orders   SURESWAB CT/NG/T. vaginalis      Plan: Wet prep positive for trichomonas - Tinidazole 2 grams once. Reports vomiting with Flagyl. Inform partner, no intercourse x 7 days after treatment completed, return in 4 weeks for TOC.  Will call back if EPT preferred. Gonorrhea, chlamydia pending. Leukocytosis in urine likely from vaginal infection. Will wait on culture.      [place], 1:41 PM 09/21/2022

## 2022-09-22 LAB — SURESWAB CT/NG/T. VAGINALIS
C. trachomatis RNA, TMA: NOT DETECTED
N. gonorrhoeae RNA, TMA: NOT DETECTED
Trichomonas vaginalis RNA: DETECTED — AB

## 2022-09-23 LAB — URINALYSIS, COMPLETE W/RFL CULTURE
Bilirubin Urine: NEGATIVE
Glucose, UA: NEGATIVE
Hgb urine dipstick: NEGATIVE
Hyaline Cast: NONE SEEN /LPF
Nitrites, Initial: NEGATIVE
Specific Gravity, Urine: 1.025 (ref 1.001–1.035)
pH: 5.5 (ref 5.0–8.0)

## 2022-09-23 LAB — URINE CULTURE
MICRO NUMBER:: 13969181
SPECIMEN QUALITY:: ADEQUATE

## 2022-09-23 LAB — CULTURE INDICATED

## 2022-09-28 ENCOUNTER — Other Ambulatory Visit: Payer: Self-pay

## 2022-09-28 DIAGNOSIS — M5416 Radiculopathy, lumbar region: Secondary | ICD-10-CM

## 2022-10-07 ENCOUNTER — Ambulatory Visit
Admission: RE | Admit: 2022-10-07 | Discharge: 2022-10-07 | Disposition: A | Discharge status: Home / Self Care | Payer: Medicare Other | Source: Ambulatory Visit | Attending: Family Medicine | Admitting: Family Medicine

## 2022-10-07 DIAGNOSIS — M47817 Spondylosis without myelopathy or radiculopathy, lumbosacral region: Secondary | ICD-10-CM | POA: Diagnosis not present

## 2022-10-07 DIAGNOSIS — M5416 Radiculopathy, lumbar region: Secondary | ICD-10-CM

## 2022-10-07 MED ORDER — IOPAMIDOL (ISOVUE-M 200) INJECTION 41%
1.0000 mL | Freq: Once | INTRAMUSCULAR | Status: AC
  Administered 2022-10-07: 1 mL via EPIDURAL

## 2022-10-07 MED ORDER — METHYLPREDNISOLONE ACETATE 40 MG/ML INJ SUSP (RADIOLOG
80.0000 mg | Freq: Once | INTRAMUSCULAR | Status: AC
  Administered 2022-10-07: 80 mg via EPIDURAL

## 2022-10-07 NOTE — Discharge Instructions (Signed)
Post Procedure Spinal Discharge Instruction Sheet  You may resume a regular diet and any medications that you routinely take (including pain medications) unless otherwise noted by MD.  No driving day of procedure.  Light activity throughout the rest of the day.  Do not do any strenuous work, exercise, bending or lifting.  The day following the procedure, you can resume normal physical activity but you should refrain from exercising or physical therapy for at least three days thereafter.  You may apply ice to the injection site, 20 minutes on, 20 minutes off, as needed. Do not apply ice directly to skin.    Common Side Effects:  Headaches- take your usual medications as directed by your physician.  Increase your fluid intake.  Caffeinated beverages may be helpful.  Lie flat in bed until your headache resolves.  Restlessness or inability to sleep- you may have trouble sleeping for the next few days.  Ask your referring physician if you need any medication for sleep.  Facial flushing or redness- should subside within a few days.  Increased pain- a temporary increase in pain a day or two following your procedure is not unusual.  Take your pain medication as prescribed by your referring physician.  Leg cramps  Please contact our office at [phone] for the following symptoms: Fever greater than 100 degrees. Headaches unresolved with medication after 2-3 days. Increased swelling, pain, or redness at injection site.   Thank you for visiting [facility] today.   YOU MAY RESUME ASPIRIN POST PROCEDURE TODAY.

## 2022-10-19 ENCOUNTER — Ambulatory Visit (INDEPENDENT_AMBULATORY_CARE_PROVIDER_SITE_OTHER): Payer: Medicare Other | Admitting: Nurse Practitioner

## 2022-10-19 ENCOUNTER — Encounter: Payer: Self-pay | Admitting: Nurse Practitioner

## 2022-10-19 VITALS — BP 120/80 | HR 78 | Resp 14 | Wt 125.0 lb

## 2022-10-19 DIAGNOSIS — A599 Trichomoniasis, unspecified: Secondary | ICD-10-CM | POA: Diagnosis not present

## 2022-10-19 DIAGNOSIS — N760 Acute vaginitis: Secondary | ICD-10-CM | POA: Diagnosis not present

## 2022-10-19 DIAGNOSIS — B9689 Other specified bacterial agents as the cause of diseases classified elsewhere: Secondary | ICD-10-CM

## 2022-10-19 LAB — WET PREP FOR TRICH, YEAST, CLUE

## 2022-10-19 MED ORDER — CLINDAMYCIN PHOSPHATE 2 % VA CREA: 1.0000 | TOPICAL_CREAM | Freq: Every day | VAGINAL | 0 refills | Status: AC

## 2022-10-19 NOTE — Progress Notes (Signed)
   Acute Office Visit  Subjective:    Patient ID: Kristen Mahoney, female    DOB: [birth year], 76 y.o.   MRN: [id number]   HPI 76 y.o. presents today for TOC. + Trich 09/21/22. Completed Tinidazole course (does not tolerate metronidazole). Partner treated as well. Not sexual activity since. No symptoms today.    Review of Systems  Constitutional: Negative.   Genitourinary: Negative.        Objective:    Physical Exam Constitutional:      Appearance: Normal appearance.  Genitourinary:    General: Normal vulva.     Vagina: Vaginal discharge present. No erythema.     BP 120/80   Pulse 78   Resp 14   Wt 125 lb (56.7 kg)   BMI 22.86 kg/m  Wt Readings from Last 3 Encounters:  10/19/22 125 lb (56.7 kg)  09/14/22 132 lb 4 oz (60 kg)  09/08/22 130 lb (59 kg)        Patient informed chaperone available to be present for breast and/or pelvic exam. Patient has requested no chaperone to be present. Patient has been advised what will be completed during breast and pelvic exam.   Wet prep + clue cells  Assessment & Plan:   Problem List Items Addressed This Visit   None Visit Diagnoses     Trichomonas infection    -  Primary   Relevant Orders   WET PREP FOR [place], YEAST, CLUE   Bacterial vaginosis       Relevant Medications   clindamycin (CLEOCIN) 2 % vaginal cream      Plan: Wet prep positive for clue cells - clindamycin vaginal cream nightly x 7 nights. Trich negative.      Tamela Gammon DNP, 2:56 PM 10/19/2022

## 2022-10-28 ENCOUNTER — Telehealth: Payer: Self-pay | Admitting: Family Medicine

## 2022-10-28 NOTE — Telephone Encounter (Signed)
Pt had epidural at [facility] on 9/25. As of today she has no pain relief and found the procedure to be painful.  Shje has had epidurals in the past at [facility] on [address] with less pain and  more success. If Dr. Tamala Julian agrees, she would like to be referred to have another epidural at that location.  Pt informed provider out of office this week.

## 2022-11-01 ENCOUNTER — Other Ambulatory Visit: Payer: Self-pay

## 2022-11-01 DIAGNOSIS — M5416 Radiculopathy, lumbar region: Secondary | ICD-10-CM

## 2022-11-01 NOTE — Telephone Encounter (Signed)
Spoke with patient and she will await call from [facility] to get epidural.

## 2022-11-11 ENCOUNTER — Other Ambulatory Visit: Payer: Self-pay | Admitting: Gastroenterology

## 2022-11-12 DIAGNOSIS — M5416 Radiculopathy, lumbar region: Secondary | ICD-10-CM | POA: Diagnosis not present

## 2022-11-26 ENCOUNTER — Ambulatory Visit: Payer: Medicare Other | Admitting: Radiology

## 2022-11-30 ENCOUNTER — Ambulatory Visit (INDEPENDENT_AMBULATORY_CARE_PROVIDER_SITE_OTHER): Payer: Medicare Other | Admitting: Nurse Practitioner

## 2022-11-30 ENCOUNTER — Encounter: Payer: Self-pay | Admitting: Nurse Practitioner

## 2022-11-30 VITALS — BP 128/82 | HR 86

## 2022-11-30 DIAGNOSIS — R350 Frequency of micturition: Secondary | ICD-10-CM | POA: Diagnosis not present

## 2022-11-30 DIAGNOSIS — R1032 Left lower quadrant pain: Secondary | ICD-10-CM | POA: Diagnosis not present

## 2022-11-30 NOTE — Progress Notes (Signed)
   Acute Office Visit  Subjective:    Patient ID: Kristen Mahoney, female    DOB: 07/22/1946, 76 y.o.   MRN: [id number]   HPI 76 y.o. presents today for urinary frequency and pelvic discomfort for about a week. Denies vaginal symptoms. Does have history of constipation and diverticulitis. Went 4 days without bowel movement but had large BM yesterday. Pain is in LLQ, intermittent, crampy.    Review of Systems  Constitutional: Negative.  Negative for fever.  Gastrointestinal:  Positive for abdominal pain (LLQ) and constipation. Negative for abdominal distention, blood in stool, diarrhea, nausea and vomiting.  Genitourinary:  Positive for frequency. Negative for difficulty urinating, dysuria, flank pain, hematuria, pelvic pain, urgency, vaginal bleeding, vaginal discharge and vaginal pain.       Objective:    Physical Exam Constitutional:      Appearance: Normal appearance.  Abdominal:     Tenderness: There is abdominal tenderness in the left lower quadrant. There is guarding. There is no right CVA tenderness, left CVA tenderness or rebound.     BP 128/82   Pulse 86   SpO2 98%  Wt Readings from Last 3 Encounters:  10/19/22 125 lb (56.7 kg)  09/14/22 132 lb 4 oz (60 kg)  09/08/22 130 lb (59 kg)        UA: negative leukocytes, negative nitrites, 1+ protein, negative blood, yellow/slightly cloudy. Microscopic: wbc 0-5, rbc none, few bacteria  Assessment & Plan:   Problem List Items Addressed This Visit   None Visit Diagnoses     Left lower quadrant abdominal pain    -  Primary   Frequency of urination       Relevant Orders   Urinalysis,Complete w/RFL Culture      Plan: Suspicious for diverticulosis/itis. Recommend bland diet for a few days. Follow up with PCP or GI if no improvement. UA unremarkable.      Tamela Gammon DNP, 2:30 PM 11/30/2022

## 2022-12-01 ENCOUNTER — Encounter: Payer: Self-pay | Admitting: Podiatry

## 2022-12-01 ENCOUNTER — Ambulatory Visit (INDEPENDENT_AMBULATORY_CARE_PROVIDER_SITE_OTHER): Payer: Medicare Other | Admitting: Podiatry

## 2022-12-01 VITALS — BP 169/83 | HR 126

## 2022-12-01 DIAGNOSIS — M5416 Radiculopathy, lumbar region: Secondary | ICD-10-CM | POA: Diagnosis not present

## 2022-12-01 DIAGNOSIS — L603 Nail dystrophy: Secondary | ICD-10-CM

## 2022-12-01 NOTE — Progress Notes (Signed)
Chief Complaint  Patient presents with   Nail Problem    Patient is here for left great toe discoloration.    HPI: 76 y.o. female presenting today as a reestablish new patient for evaluation of discoloration and partial detachment to the left great toenail.  Patient denies a history of injury.  She says that over the years the nail became discolored and partially detached.  She is concerned for possible toenail fungus.   Patient also is a diabetic and presents for diabetic foot exam.  She presents for further treatment and evaluation  Past Medical History:  Diagnosis Date   Allergy    seasonal   Anxiety    Arthritis    knees,all over   Basal cell carcinoma    Benign breast cyst in female    PATIENT HAS HISTORY OF BREAST CYSTS   Cataract    beginning bilateral   Diabetes mellitus    GERD (gastroesophageal reflux disease)    Headache    Herpes progenitalis    Hyperlipidemia    Hypertension    Kidney stone    Kidney stones    OSA on CPAP    Sleep apnea    cpap   Urinary, incontinence, stress female    Vertigo     Past Surgical History:  Procedure Laterality Date   ABDOMINAL HYSTERECTOMY  1986   leiomyomata   Basal cell excised     breast cystectomy     right   CHOLECYSTECTOMY  2003   COLONOSCOPY     CYSTOSCOPY/URETEROSCOPY/HOLMIUM LASER/STENT PLACEMENT Right 01/12/2022   Procedure: CYSTOSCOPY/RETROGRADE/ URETEROSCOPY/HOLMIUM LASER/STENT PLACEMENT;  Surgeon: Robley Fries, MD;  Location: [facility];  Service: Urology;  Laterality: Right;   ear cystectomy     ESOPHAGOGASTRODUODENOSCOPY     KNEE ARTHROSCOPY     right   LITHOTRIPSY     RECTOVAGINAL FISTULA CLOSURE     WISDOM TOOTH EXTRACTION      Allergies  Allergen Reactions   Ciprofloxacin Rash   Morphine And Related Itching   Prozac [Fluoxetine] Other (See Comments)    sweats, jittery   Zoloft [Sertraline Hcl] Other (See Comments)    sweating, jittery     Physical Exam: General: The patient is alert  and oriented x3 in no acute distress.  Dermatology: Hyperkeratotic completely dystrophic nail noted with partial detachment of the underlying nailbed noted to the left hallux nail plate  Vascular: Palpable pedal pulses bilaterally. Capillary refill within normal limits.  Negative for any significant edema or erythema  Neurological: Grossly intact via light touch  Musculoskeletal Exam: No pedal deformities noted   Assessment: 1.  Dystrophic nail with partial detachment and underlying nailbed left hallux 2.  Encounter for diabetic foot exam  Plan of Care:  1. Patient evaluated.  Comprehensive diabetic foot exam performed today 2.  Today we discussed different treatment options for the patient.  I do believe it is in the patient's best interest to have the toenail completely removed and allow new nail to grow in.  The procedure was explained in detail to the patient as well as risk benefits advantages and disadvantages.  All patient questions were answered.  Patient consented for total temporary nail avulsion of the left hallux nail plate 3.  The left great toe was prepped aseptically and digital block performed using 3 mL of 2% lidocaine plain.  After this the nail plate was avulsed in toto and dressings applied with post care instructions provided. 4.  Return to clinic  as needed     Edrick Kins, DPM [facility]  Dr. Edrick Kins, DPM    [address], [place] [address]                Office [phone]  Fax [phone]

## 2022-12-02 ENCOUNTER — Telehealth: Payer: Self-pay | Admitting: *Deleted

## 2022-12-02 LAB — URINALYSIS, COMPLETE W/RFL CULTURE
Bilirubin Urine: NEGATIVE
Glucose, UA: NEGATIVE
Hgb urine dipstick: NEGATIVE
Hyaline Cast: NONE SEEN /LPF
Leukocyte Esterase: NEGATIVE
Nitrites, Initial: NEGATIVE
RBC / HPF: NONE SEEN /HPF (ref 0–2)
Specific Gravity, Urine: 1.03 (ref 1.001–1.035)
pH: 6 (ref 5.0–8.0)

## 2022-12-02 LAB — URINE CULTURE
MICRO NUMBER:: 14272097
SPECIMEN QUALITY:: ADEQUATE

## 2022-12-02 LAB — CULTURE INDICATED

## 2022-12-02 NOTE — Telephone Encounter (Signed)
Patient is calling back to ask about taking a shower because she had injections in her back and that doctor is suggesting  doing warm showers, will this be ok?

## 2022-12-02 NOTE — Telephone Encounter (Signed)
Patient is wanting to know if it is ok to take a shower after the nail procedure yesterday,asked at visit but not clear as to what physician said. Please advise.

## 2022-12-03 NOTE — Telephone Encounter (Signed)
Yes that is okay.  Please notify patient.  Thanks, Dr. Amalia Hailey

## 2022-12-06 NOTE — Telephone Encounter (Signed)
Patient updated thru voice message.

## 2022-12-31 ENCOUNTER — Other Ambulatory Visit (HOSPITAL_COMMUNITY): Payer: Self-pay | Admitting: Physical Medicine and Rehabilitation

## 2022-12-31 ENCOUNTER — Ambulatory Visit (HOSPITAL_COMMUNITY)
Admission: RE | Admit: 2022-12-31 | Discharge: 2022-12-31 | Disposition: A | Discharge status: Home / Self Care | Payer: Medicare Other | Source: Ambulatory Visit | Attending: Physical Medicine and Rehabilitation | Admitting: Physical Medicine and Rehabilitation

## 2022-12-31 DIAGNOSIS — M79605 Pain in left leg: Secondary | ICD-10-CM | POA: Diagnosis not present

## 2022-12-31 DIAGNOSIS — M5416 Radiculopathy, lumbar region: Secondary | ICD-10-CM | POA: Diagnosis not present

## 2023-01-07 DIAGNOSIS — M5416 Radiculopathy, lumbar region: Secondary | ICD-10-CM | POA: Diagnosis not present

## 2023-01-13 DIAGNOSIS — M5416 Radiculopathy, lumbar region: Secondary | ICD-10-CM | POA: Diagnosis not present

## 2023-01-31 ENCOUNTER — Ambulatory Visit: Payer: Medicare Other

## 2023-01-31 ENCOUNTER — Ambulatory Visit (INDEPENDENT_AMBULATORY_CARE_PROVIDER_SITE_OTHER): Payer: Medicare Other | Admitting: Podiatry

## 2023-01-31 ENCOUNTER — Ambulatory Visit (INDEPENDENT_AMBULATORY_CARE_PROVIDER_SITE_OTHER): Payer: Medicare Other

## 2023-01-31 VITALS — BP 143/88 | HR 72

## 2023-01-31 DIAGNOSIS — M76822 Posterior tibial tendinitis, left leg: Secondary | ICD-10-CM | POA: Diagnosis not present

## 2023-01-31 DIAGNOSIS — M79672 Pain in left foot: Secondary | ICD-10-CM

## 2023-01-31 DIAGNOSIS — R52 Pain, unspecified: Secondary | ICD-10-CM | POA: Diagnosis not present

## 2023-01-31 DIAGNOSIS — M79671 Pain in right foot: Secondary | ICD-10-CM

## 2023-01-31 DIAGNOSIS — M76821 Posterior tibial tendinitis, right leg: Secondary | ICD-10-CM | POA: Diagnosis not present

## 2023-01-31 MED ORDER — BETAMETHASONE SOD PHOS & ACET 6 (3-3) MG/ML IJ SUSP: 3.0000 mg | Freq: Once | INTRAMUSCULAR | Status: AC

## 2023-01-31 NOTE — Progress Notes (Signed)
Chief Complaint  Patient presents with   Foot Pain    Bilateral ankle pain, started 2 weeks ago     HPI: 77 y.o. female presenting today for new complaint of pain and tenderness associated to the medial aspect of bilateral ankles that began about 2 weeks ago.  Idiopathic onset.  Denies a history of injury.  Says that she has having ankle pain and would like to have it evaluated.  She currently has not done anything for treatment  Past Medical History:  Diagnosis Date   Allergy    seasonal   Anxiety    Arthritis    knees,all over   Basal cell carcinoma    Benign breast cyst in female    PATIENT HAS HISTORY OF BREAST CYSTS   Cataract    beginning bilateral   Diabetes mellitus    GERD (gastroesophageal reflux disease)    Headache    Herpes progenitalis    Hyperlipidemia    Hypertension    Kidney stone    Kidney stones    OSA on CPAP    Sleep apnea    cpap   Urinary, incontinence, stress female    Vertigo    Past Surgical History:  Procedure Laterality Date   ABDOMINAL HYSTERECTOMY  1986   leiomyomata   Basal cell excised     breast cystectomy     right   CHOLECYSTECTOMY  2003   COLONOSCOPY     CYSTOSCOPY/URETEROSCOPY/HOLMIUM LASER/STENT PLACEMENT Right 01/12/2022   Procedure: CYSTOSCOPY/RETROGRADE/ URETEROSCOPY/HOLMIUM LASER/STENT PLACEMENT;  Surgeon: Robley Fries, MD;  Location: [facility];  Service: Urology;  Laterality: Right;   ear cystectomy     ESOPHAGOGASTRODUODENOSCOPY     KNEE ARTHROSCOPY     right   LITHOTRIPSY     RECTOVAGINAL FISTULA CLOSURE     WISDOM TOOTH EXTRACTION     Allergies  Allergen Reactions   Ciprofloxacin Rash   Morphine And Related Itching   Prozac [Fluoxetine] Other (See Comments)    sweats, jittery   Zoloft [Sertraline Hcl] Other (See Comments)    sweating, jittery    Physical Exam: General: The patient is alert and oriented x3 in no acute distress.  Dermatology: Skin is warm, dry and supple bilateral lower extremities.  Negative for open lesions or macerations.  Vascular: Palpable pedal pulses bilaterally. No edema or erythema noted. Capillary refill within normal limits.  Neurological: Epicritic and protective threshold grossly intact bilaterally.   Musculoskeletal Exam: Pain on palpation noted along posterior tibial tendon of the bilateral lower extremity. Range of motion within normal limits. Muscle strength 5/5 in all muscle groups bilateral lower extremities.  Radiographic Exam B/L ankles 01/31/2023:  Normal osseous mineralization. Joint spaces preserved. No fracture or dislocation identified.    Assessment: 1. Posterior tibial tendinitis bilateral   Plan of Care:  1. Patient was evaluated. Radiographs were reviewed today. 2. Injection of 0.5 mL Celestone Soluspan injected into the posterior tibial tendon sheath.  3.  Recommend good supportive shoes and sneakers.  Advised against going barefoot 4.  Return to clinic 4 weeks   Edrick Kins, DPM [facility]  Dr. Edrick Kins, DPM    [address], [place] [address]  Office [phone]  Fax [phone]

## 2023-02-01 ENCOUNTER — Telehealth: Payer: Self-pay | Admitting: *Deleted

## 2023-02-01 NOTE — Telephone Encounter (Signed)
Patient is wanting to know if she can wear a stretch sleeve on ankle, please advise.

## 2023-02-01 NOTE — Telephone Encounter (Signed)
Yes, that is totally fine.  Please notify patient.  Thanks, Dr. Amalia Hailey

## 2023-02-02 NOTE — Telephone Encounter (Signed)
Patient updated thru voice message.

## 2023-02-08 DIAGNOSIS — M533 Sacrococcygeal disorders, not elsewhere classified: Secondary | ICD-10-CM | POA: Diagnosis not present

## 2023-02-09 ENCOUNTER — Other Ambulatory Visit: Payer: Self-pay | Admitting: Gastroenterology

## 2023-02-09 ENCOUNTER — Encounter: Payer: Self-pay | Admitting: Gastroenterology

## 2023-02-28 ENCOUNTER — Ambulatory Visit (INDEPENDENT_AMBULATORY_CARE_PROVIDER_SITE_OTHER): Payer: Medicare Other | Admitting: Podiatry

## 2023-02-28 DIAGNOSIS — M76822 Posterior tibial tendinitis, left leg: Secondary | ICD-10-CM

## 2023-02-28 NOTE — Progress Notes (Signed)
Chief Complaint  Patient presents with   Foot Pain    Left  foot tendinitis, patient is having some bilateral  toenail pain,     HPI: 77 y.o. female presenting today for follow-up evaluation of posterior tibial tendinitis bilateral.  Patient states the injection seem to have resolved her problems.  She no longer has any pain or tenderness associated to this area.  She states that recently she has been experiencing some pain and tenderness associated to the medial aspect of the bilateral great toes.  There is a callus to this area and she is concerned that possibly the callus could be irritating her shoes and creating pain  Past Medical History:  Diagnosis Date   Allergy    seasonal   Anxiety    Arthritis    knees,all over   Basal cell carcinoma    Benign breast cyst in female    PATIENT HAS HISTORY OF BREAST CYSTS   Cataract    beginning bilateral   Diabetes mellitus    GERD (gastroesophageal reflux disease)    Headache    Herpes progenitalis    Hyperlipidemia    Hypertension    Kidney stone    Kidney stones    OSA on CPAP    Sleep apnea    cpap   Urinary, incontinence, stress female    Vertigo    Past Surgical History:  Procedure Laterality Date   ABDOMINAL HYSTERECTOMY  1986   leiomyomata   Basal cell excised     breast cystectomy     right   CHOLECYSTECTOMY  2003   COLONOSCOPY     CYSTOSCOPY/URETEROSCOPY/HOLMIUM LASER/STENT PLACEMENT Right 01/12/2022   Procedure: CYSTOSCOPY/RETROGRADE/ URETEROSCOPY/HOLMIUM LASER/STENT PLACEMENT;  Surgeon: Robley Fries, MD;  Location: [facility];  Service: Urology;  Laterality: Right;   ear cystectomy     ESOPHAGOGASTRODUODENOSCOPY     KNEE ARTHROSCOPY     right   LITHOTRIPSY     RECTOVAGINAL FISTULA CLOSURE     WISDOM TOOTH EXTRACTION     Allergies  Allergen Reactions   Ciprofloxacin Rash   Morphine And Related Itching   Prozac [Fluoxetine] Other (See Comments)    sweats, jittery   Zoloft [Sertraline Hcl] Other (See  Comments)    sweating, jittery    Physical Exam: General: The patient is alert and oriented x3 in no acute distress.  Dermatology: Skin is warm, dry and supple bilateral lower extremities. Negative for open lesions or macerations.  There is some hyperkeratotic callus tissue noted along the medial aspect of the bilateral great toes  Vascular: Palpable pedal pulses bilaterally. No edema or erythema noted. Capillary refill within normal limits.  Neurological: Epicritic and protective threshold grossly intact bilaterally.   Musculoskeletal Exam: Negative for any appreciable pain on palpation noted along posterior tibial tendon of the bilateral lower extremity. Range of motion within normal limits. Muscle strength 5/5 in all muscle groups bilateral lower extremities.  Radiographic Exam B/L ankles 01/31/2023:  Normal osseous mineralization. Joint spaces preserved. No fracture or dislocation identified.    Assessment: 1. Posterior tibial tendinitis bilateral 2.  Symptomatic calluses bilateral great toes   Plan of Care:  1. Patient was evaluated.  2.  Excisional debridement of the hyperkeratotic calluses was performed today using a tissue nipper and 312 scalpel without incident or bleeding.  The patient felt significantly better.  This was performed as a courtesy for the patient 3.  Continue wearing good supportive shoes and sneakers 4.  Return to clinic  as needed  Edrick Kins, DPM [facility]  Dr. Edrick Kins, DPM    [address], [place] [address]                Office [phone]  Fax [phone]

## 2023-03-03 DIAGNOSIS — M533 Sacrococcygeal disorders, not elsewhere classified: Secondary | ICD-10-CM | POA: Diagnosis not present

## 2023-03-08 ENCOUNTER — Encounter: Payer: Self-pay | Admitting: Nurse Practitioner

## 2023-03-15 ENCOUNTER — Ambulatory Visit (INDEPENDENT_AMBULATORY_CARE_PROVIDER_SITE_OTHER): Payer: Medicare Other

## 2023-03-15 ENCOUNTER — Encounter: Payer: Self-pay | Admitting: Nurse Practitioner

## 2023-03-15 ENCOUNTER — Encounter: Payer: Self-pay | Admitting: Family Medicine

## 2023-03-15 ENCOUNTER — Ambulatory Visit (INDEPENDENT_AMBULATORY_CARE_PROVIDER_SITE_OTHER): Payer: Medicare Other | Admitting: Family Medicine

## 2023-03-15 ENCOUNTER — Ambulatory Visit (INDEPENDENT_AMBULATORY_CARE_PROVIDER_SITE_OTHER): Payer: Medicare Other | Admitting: Nurse Practitioner

## 2023-03-15 VITALS — BP 102/72 | HR 82 | Wt 127.0 lb

## 2023-03-15 VITALS — BP 110/76 | HR 83 | Ht 62.0 in | Wt 127.0 lb

## 2023-03-15 DIAGNOSIS — M542 Cervicalgia: Secondary | ICD-10-CM | POA: Diagnosis not present

## 2023-03-15 DIAGNOSIS — Z8742 Personal history of other diseases of the female genital tract: Secondary | ICD-10-CM | POA: Diagnosis not present

## 2023-03-15 DIAGNOSIS — S060X9A Concussion with loss of consciousness of unspecified duration, initial encounter: Secondary | ICD-10-CM

## 2023-03-15 DIAGNOSIS — Z01419 Encounter for gynecological examination (general) (routine) without abnormal findings: Secondary | ICD-10-CM

## 2023-03-15 DIAGNOSIS — Z9189 Other specified personal risk factors, not elsewhere classified: Secondary | ICD-10-CM

## 2023-03-15 DIAGNOSIS — Z780 Asymptomatic menopausal state: Secondary | ICD-10-CM | POA: Diagnosis not present

## 2023-03-15 MED ORDER — NORTRIPTYLINE HCL 10 MG PO CAPS: 10.0000 mg | ORAL_CAPSULE | Freq: Every day | ORAL | 1 refills | Status: DC

## 2023-03-15 NOTE — Patient Instructions (Addendum)
Thank you for coming in today.   Please get an Xray today before you leave   I've sent a prescription for nortriptyline to your pharmacy.   I've referred you to Physical Therapy.  Let us know if you don't hear from them in one week.   Check back in 3 weeks
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
## 2023-03-15 NOTE — Progress Notes (Signed)
   Kristen Mahoney [birth year] [id number]   History:  77 y.o. [id number] presents for breast and pelvic exam. No GYN complaints. Postmenopausal - no HRT. S/P TAH 1986 for fibroids. H/O DM, HLD, HTN.   Gynecologic History No LMP recorded. Patient has had a hysterectomy.   Contraception: post menopausal status Sexually active: Yes  Health Maintenance Last Pap: 06/26/2014. Results were: Normal Last mammogram: 08/12/2022. Results were: Normal Last colonoscopy: 07/06/2022. Results were: Tubular adenoma, no repeat d/t age Last Dexa: 04/01/2020. Results were: Normal  Past medical history, past surgical history, family history and social history were all reviewed and documented in the EPIC chart. Married. Daughter, lives in [place]. Son in [place]. 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 Father with history of colon cancer. Significant family history of diabetes.   ROS:  A ROS was performed and pertinent positives and negatives are included.  Exam:  Vitals:   03/15/23 1007  BP: 102/72  Pulse: 82  SpO2: 97%  Weight: 127 lb (57.6 kg)   Body mass index is 23.23 kg/m.  General appearance:  Normal Thyroid:  Symmetrical, normal in size, without palpable masses or nodularity. Respiratory  Auscultation:  Clear without wheezing or rhonchi Cardiovascular  Auscultation:  Regular rate, without rubs, murmurs or gallops  Edema/varicosities:  Not grossly evident Abdominal  Soft,nontender, without masses, guarding or rebound.  Liver/spleen:  No organomegaly noted  Hernia:  None appreciated  Skin  Inspection:  Grossly normal Breasts: Examined lying and sitting.   Right: Without masses, retractions, nipple discharge or axillary adenopathy.   Left: Without masses, retractions, nipple discharge or axillary adenopathy. Genitourinary   Inguinal/mons:  Normal without inguinal adenopathy  External genitalia:  Normal appearing vulva with no masses, tenderness, or lesions  BUS/Urethra/Skene's glands:  Normal  Vagina:   Normal appearing with normal color and discharge, no lesions. Atrophic changes  Cervix:  and uterus absent  Adnexa/parametria:     Rt: Normal in size, without masses or tenderness.   Lt: Normal in size, without masses or tenderness.  Anus and perineum: Normal  Digital rectal exam: Deferred  Patient informed chaperone available to be present for breast and pelvic exam. Patient has requested no chaperone to be present. Patient has been advised what will be completed during breast and pelvic exam.   Assessment/Plan:  77 y.o. [id number] for breast and pelvic exam.   Encounter for breast and pelvic examination - Education provided on SBEs, importance of preventative screenings, current guidelines, high calcium diet, regular exercise, and multivitamin daily.  Labs with PCP.   Postmenopausal - no HRT. S/P TAH for fibroids.   Screening for cervical cancer - Normal Pap history.  No longer screening per guidelines.   Screening for breast cancer - Normal mammogram history.  Continue annual screenings.  Normal breast exam today.  Screening for colon cancer - 06/2022 colonoscopy. Screenings no longer recommended due to age.   Screening for osteoporosis - Normal bone density in 2021. Will repeat at 5-year interval per recommendation.     Return in 1 year for breast and pelvic exam.     Tamela Gammon DNP, 10:12 AM 03/15/2023

## 2023-03-17 DIAGNOSIS — M1711 Unilateral primary osteoarthritis, right knee: Secondary | ICD-10-CM | POA: Diagnosis not present

## 2023-03-17 DIAGNOSIS — M170 Bilateral primary osteoarthritis of knee: Secondary | ICD-10-CM | POA: Diagnosis not present

## 2023-03-17 DIAGNOSIS — M1712 Unilateral primary osteoarthritis, left knee: Secondary | ICD-10-CM | POA: Diagnosis not present

## 2023-03-18 ENCOUNTER — Telehealth: Payer: Self-pay

## 2023-03-18 NOTE — Progress Notes (Signed)
X-ray cervical spine shows no fractures.  Arthritis is visible.

## 2023-03-18 NOTE — Telephone Encounter (Signed)
Pt called reporting hallucinations from the nortriptyline; seeing bugs crawling all over her and a tree falling into the window. Advised to stop rx. She notes not having much of a HA currently.   She was also concerned there may be an interaction between the rx and the steroid injection she had in her L knee yesterday at [facility].   Please advise if any further instructions are needed

## 2023-03-23 DIAGNOSIS — E785 Hyperlipidemia, unspecified: Secondary | ICD-10-CM | POA: Diagnosis not present

## 2023-03-23 DIAGNOSIS — E119 Type 2 diabetes mellitus without complications: Secondary | ICD-10-CM | POA: Diagnosis not present

## 2023-03-23 DIAGNOSIS — I10 Essential (primary) hypertension: Secondary | ICD-10-CM | POA: Diagnosis not present

## 2023-03-28 NOTE — Telephone Encounter (Signed)
I agree that you should have stopped nortriptyline.  We should not use that medicine again. There should not be much of an interaction between nortriptyline and a steroid injection.

## 2023-03-28 NOTE — Telephone Encounter (Signed)
Called pt and advised per Dr. Georgina Snell. Pt will plan on uses Voltaren Gel as well as alternating heat and ice until for follow-up visit with Dr. Georgina Snell on 04/05/23.

## 2023-03-29 DIAGNOSIS — M1711 Unilateral primary osteoarthritis, right knee: Secondary | ICD-10-CM | POA: Diagnosis not present

## 2023-04-01 DIAGNOSIS — M533 Sacrococcygeal disorders, not elsewhere classified: Secondary | ICD-10-CM | POA: Diagnosis not present

## 2023-04-05 ENCOUNTER — Ambulatory Visit (INDEPENDENT_AMBULATORY_CARE_PROVIDER_SITE_OTHER): Payer: Medicare Other | Admitting: Family Medicine

## 2023-04-05 VITALS — BP 102/68 | HR 76 | Ht 62.0 in | Wt 128.0 lb

## 2023-04-05 DIAGNOSIS — S060X9D Concussion with loss of consciousness of unspecified duration, subsequent encounter: Secondary | ICD-10-CM | POA: Diagnosis not present

## 2023-04-05 DIAGNOSIS — R519 Headache, unspecified: Secondary | ICD-10-CM

## 2023-04-05 NOTE — Patient Instructions (Signed)
Thank you for coming in today.   Proceed to PT as scheduled.   Plan for MRI of the brain.   Think about medicine for headache (topamax) and something for anxiety or irritability.   Recheck in about 1 month.
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
## 2023-04-06 ENCOUNTER — Ambulatory Visit: Payer: Medicare Other | Admitting: Physical Therapy

## 2023-04-07 DIAGNOSIS — M533 Sacrococcygeal disorders, not elsewhere classified: Secondary | ICD-10-CM | POA: Diagnosis not present

## 2023-04-13 ENCOUNTER — Ambulatory Visit: Payer: Medicare Other | Attending: Family Medicine

## 2023-04-13 ENCOUNTER — Other Ambulatory Visit: Payer: Self-pay

## 2023-04-13 DIAGNOSIS — M542 Cervicalgia: Secondary | ICD-10-CM | POA: Diagnosis not present

## 2023-04-13 DIAGNOSIS — M6281 Muscle weakness (generalized): Secondary | ICD-10-CM | POA: Insufficient documentation

## 2023-04-13 DIAGNOSIS — R252 Cramp and spasm: Secondary | ICD-10-CM | POA: Diagnosis not present

## 2023-04-13 NOTE — Therapy (Signed)
OUTPATIENT PHYSICAL THERAPY CERVICAL EVALUATION   Patient Name: Kristen Mahoney MRN: [id number] DOB:Mar 06, 1946, 77 y.o., female Today's Date: 04/14/2023  END OF SESSION:  PT End of Session - 04/14/23 0007     Visit Number 1    Date for PT Re-Evaluation 06/09/23    Authorization Type BCBS MEDICARE    PT Start Time 1145    PT Stop Time 1230    PT Time Calculation (min) 45 min    Activity Tolerance Patient tolerated treatment well    Behavior During Therapy WFL for tasks assessed/performed             Past Medical History:  Diagnosis Date   Allergy    seasonal   Anxiety    Arthritis    knees,all over   Basal cell carcinoma    Benign breast cyst in female    PATIENT HAS HISTORY OF BREAST CYSTS   Cataract    beginning bilateral   Diabetes mellitus    GERD (gastroesophageal reflux disease)    Headache    Herpes progenitalis    Hyperlipidemia    Hypertension    Kidney stone    Kidney stones    OSA on CPAP    Sleep apnea    cpap   Urinary, incontinence, stress female    Vertigo    Past Surgical History:  Procedure Laterality Date   ABDOMINAL HYSTERECTOMY  1986   leiomyomata   Basal cell excised     breast cystectomy     right   CHOLECYSTECTOMY  2003   COLONOSCOPY     CYSTOSCOPY/URETEROSCOPY/HOLMIUM LASER/STENT PLACEMENT Right 01/12/2022   Procedure: CYSTOSCOPY/RETROGRADE/ URETEROSCOPY/HOLMIUM LASER/STENT PLACEMENT;  Surgeon: Noel Christmas, MD;  Location: [facility];  Service: Urology;  Laterality: Right;   ear cystectomy     ESOPHAGOGASTRODUODENOSCOPY     KNEE ARTHROSCOPY     right   LITHOTRIPSY     RECTOVAGINAL FISTULA CLOSURE     WISDOM TOOTH EXTRACTION     Patient Active Problem List   Diagnosis Date Noted   Degenerative disc disease, lumbar 09/09/2022   Thoracic back pain 05/28/2022   Central sleep apnea syndrome 09/07/2021   Poor compliance with continuous positive airway pressure treatment 09/07/2021   Episodic tension-type headache, not  intractable 09/07/2021   Gastroesophageal reflux disease without esophagitis 04/03/2021   History of weight loss 04/03/2021   Poor compliance with CPAP treatment 04/03/2021   History of total vaginal hysterectomy (TVH) 03/11/2021   Acute renal failure superimposed on stage 3b chronic kidney disease 11/06/2019   Osteoarthritis of right knee 09/21/2019   Rectal pain 08/23/2017   Rectal bleeding 08/23/2017   Incontinence of feces 07/14/2017   HTN (hypertension), malignant 07/14/2017   Anxiety and depression 07/14/2017   Weight loss, unintentional 07/14/2017   Gas pain 06/17/2017   Bloating 06/17/2017   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   OSA on CPAP 07/13/2016   Trichomonas vaginitis 08/12/2015   Type 2 diabetes mellitus without complications 03/17/2015   Kidney stone    DIARRHEA, INFECTIOUS 09/04/2009   BLOOD IN STOOL-MELENA 09/04/2009   Abdominal pain, left lower quadrant 09/04/2009   BLOOD IN STOOL, OCCULT 09/04/2009   HYPERLIPIDEMIA 12/25/2008   Reactive depression 12/25/2008   GERD 12/25/2008   HIATAL HERNIA 12/25/2008   Diverticulosis of colon 12/25/2008   IRRITABLE BOWEL SYNDROME 12/25/2008   RECTOVAGINAL FISTULA 12/25/2008   MITRAL VALVE PROLAPSE, HX OF 12/25/2008   Hiatal hernia 12/25/2008    PCP: Jarrett Soho, PA-C  REFERRING PROVIDER: Rodolph Bong, MD  REFERRING DIAG: M54.2 (ICD-10-CM) - Neck pain  THERAPY DIAG:  Cervicalgia  Cramp and spasm  Muscle weakness (generalized)  Rationale for Evaluation and Treatment: Rehabilitation  ONSET DATE: 03/15/2023  SUBJECTIVE:                                                                                                                                                                                                         SUBJECTIVE STATEMENT: Patient states she has been having neck pain for about 3-4 weeks.  "I was taking out the garbage and when I was putting the garbage in the can and the lid came down and hit  me on the head."  My left shoulder neck and head have been hurting worse since then.  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  She would like to get rid of the pain and be able to turn her head without pain.   Hand dominance: Right  PERTINENT HISTORY:  Scoliosis  PAIN:  Are you having pain?  5-8/10 but occasionally as low as 2-3  PRECAUTIONS: None  WEIGHT BEARING RESTRICTIONS: No  FALLS:  Has patient fallen in last 6 months? No  LIVING ENVIRONMENT: Lives with: lives alone Lives in: House/apartment  OCCUPATION: retired  PLOF: Independent, Independent with basic ADLs, Independent with household mobility without device, Independent with community mobility with device, Independent with homemaking with ambulation, Independent with gait, and Independent with transfers  PATIENT GOALS: She would like to get rid of the pain and be able to turn her head without pain.   NEXT MD VISIT: prn  OBJECTIVE:   DIAGNOSTIC FINDINGS:  Narrative & Impression  CLINICAL DATA:  Neck pain.  Head injury 1 month ago.   EXAM: CERVICAL SPINE - 2-3 VIEW   COMPARISON:  Cervical MRI 02/24/2006.   FINDINGS: The prevertebral soft tissues are normal. The alignment is anatomic through T1. There is no evidence of acute fracture or traumatic subluxation. The C1-2 articulation appears normal in the AP projection. There is multilevel spondylosis with disc space narrowing, endplate osteophytes and facet hypertrophy, greatest at C5-6 and C6-7. There are scattered facet degenerative changes. The spondylosis appears similar to previous MRI.   IMPRESSION: No evidence of acute cervical spine fracture, traumatic subluxation or static signs of instability. Multilevel spondylosis, similar to previous MR    PATIENT SURVEYS:  NDI 17 (34%)  COGNITION: Overall cognitive status: Within functional limits for tasks  assessed  SENSATION: WFL  POSTURE: rounded shoulders, forward head, and thoracic and lumbar scoliosis  PALPATION: Tender over sub occiptal area.  Taut bands and trigger points bilateral upper traps, levator paraspinals and parascapular areas.   CERVICAL ROM:   Active ROM A/PROM (deg) eval  Flexion WNL  Extension WNL  Right lateral flexion 30  Left lateral flexion 55  Right rotation 58  Left rotation 60   (Blank rows = not tested)  UPPER EXTREMITY ROM:  WFL, left shoulder slightly more restricted than left.   UPPER EXTREMITY MMT:  Generally 4/5 with exception of left shoulder scaption with IR and ER which were 3+/5  CERVICAL SPECIAL TESTS:  Spurling's test: Negative and Distraction test: Negative   TODAY'S TREATMENT:                                                                                                                              DATE: 04/13/23 Initial eval completed and initiated HEP   PATIENT EDUCATION:  Education details: Initiated HEP and discussed pain control using ice or heat along with medication Person educated: Patient Education method: Programmer, multimedia, Demonstration, Verbal cues, and Handouts Education comprehension: verbalized understanding, returned demonstration, and verbal cues required  HOME EXERCISE PROGRAM: Access Code: [email or web address] URL: [email or web address] Date: 04/13/2023 Prepared by: Mikey Kirschner  Exercises - Seated Cervical Retraction  - 1 x daily - 7 x weekly - 1 sets - 10 reps - 2-3 sec hold - Seated Cervical Rotation AROM  - 1 x daily - 7 x weekly - 1 sets - 10 reps - 2-3 sec hold - Seated Assisted Cervical Rotation with Towel  - 1 x daily - 7 x weekly - 1 sets - 10 reps - 2-3 sec hold - Seated Cervical Sidebending AROM  - 1 x daily - 7 x weekly - 1 sets - 10 reps - 2-3 sec hold  ASSESSMENT:  CLINICAL IMPRESSION: Patient is a 77 y.o. female who was seen today for physical therapy evaluation and treatment for neck  pain.  She has pronounced thoracic and lumbar scoliosis.  She presents with decreased c spine  and left shoulder ROM,  decreased left UE strength, s/s of possible impingement or rotator cuff pathology in left shoulder, decreased function and elevated pain.  She would benefit from skilled PT for C spine ROM, dry needling for soft tissue mobility,p  OBJECTIVE IMPAIRMENTS: decreased ROM, decreased strength, hypomobility, increased fascial restrictions, increased muscle spasms, impaired flexibility, postural dysfunction, and pain.   ACTIVITY LIMITATIONS: carrying, lifting, bending, sleeping, bed mobility, bathing, dressing, reach over head, and hygiene/grooming  PARTICIPATION LIMITATIONS: meal prep, cleaning, laundry, driving, shopping, community activity, yard work, and church  PERSONAL FACTORS: Age and Fitness are also affecting patient's functional outcome.   REHAB POTENTIAL: Good  CLINICAL DECISION MAKING: Stable/uncomplicated  EVALUATION COMPLEXITY: Low   GOALS: Goals reviewed with patient? Yes  SHORT TERM GOALS: Target date: 05/11/2023   Pain report to be no greater  than 4/10  Baseline:  Goal status: INITIAL  2.  Patient will be independent with initial HEP  Baseline:  Goal status: INITIAL  LONG TERM GOALS: Target date: 06/09/2023   Patient to report pain no greater than 2/10  Baseline:  Goal status: INITIAL  2.  Patient to be independent with advanced HEP  Baseline:  Goal status: INITIAL  3.  Patient to be able to sleep through the night  Baseline:  Goal status: INITIAL  4.  Patient to report 85% improvement in overall symptoms Baseline:  Goal status: INITIAL  5.  NDI to improve to 60% Baseline: 34% Goal status: INITIAL  6.  Patient to be able to reach overhead to do basic grooming and dressing with left UE without pain Baseline:  Goal status: INITIAL   PLAN:  PT FREQUENCY: 2x/week  PT DURATION: 8 weeks  PLANNED INTERVENTIONS: Therapeutic exercises,  Therapeutic activity, Neuromuscular re-education, Patient/Family education, Self Care, Joint mobilization, Aquatic Therapy, Dry Needling, Electrical stimulation, Spinal mobilization, Cryotherapy, Moist heat, Taping, Traction, Ultrasound, Ionotophoresis 4mg /ml Dexamethasone, Manual therapy, and Re-evaluation  PLAN FOR NEXT SESSION: Review HEP, begin postural strengthening, possibly UBE, DN if patient agrees   Victorino Dike B. Samuel Rittenhouse, PT 04/14/23 11:49 AM [facility] [address] [place], [place] [address] Phone # [phone] Fax [phone]

## 2023-04-15 ENCOUNTER — Encounter: Payer: Self-pay | Admitting: Family Medicine

## 2023-04-19 DIAGNOSIS — H353131 Nonexudative age-related macular degeneration, bilateral, early dry stage: Secondary | ICD-10-CM | POA: Diagnosis not present

## 2023-04-20 ENCOUNTER — Ambulatory Visit
Admission: RE | Admit: 2023-04-20 | Discharge: 2023-04-20 | Disposition: A | Discharge status: Home / Self Care | Payer: Medicare Other | Source: Ambulatory Visit | Attending: Family Medicine | Admitting: Family Medicine

## 2023-04-20 DIAGNOSIS — R519 Headache, unspecified: Secondary | ICD-10-CM | POA: Diagnosis not present

## 2023-04-20 DIAGNOSIS — S060X9D Concussion with loss of consciousness of unspecified duration, subsequent encounter: Secondary | ICD-10-CM

## 2023-04-21 NOTE — Progress Notes (Signed)
MRI of the brain does not show any bleeding or stroke or brain tumors.  We do see some chronic changes that would be typical for age.  No explanation of your symptoms.  This is all very typical for concussion.  Concussions are not visible with MRI or CT scan.  Continue vestibular physical therapy and recheck with me as scheduled or sooner if needed.

## 2023-05-05 ENCOUNTER — Ambulatory Visit: Payer: Medicare Other | Attending: Family Medicine

## 2023-05-05 DIAGNOSIS — M6281 Muscle weakness (generalized): Secondary | ICD-10-CM | POA: Diagnosis not present

## 2023-05-05 DIAGNOSIS — R252 Cramp and spasm: Secondary | ICD-10-CM | POA: Diagnosis not present

## 2023-05-05 DIAGNOSIS — M542 Cervicalgia: Secondary | ICD-10-CM | POA: Insufficient documentation

## 2023-05-05 DIAGNOSIS — R262 Difficulty in walking, not elsewhere classified: Secondary | ICD-10-CM

## 2023-05-05 NOTE — Therapy (Signed)
OUTPATIENT PHYSICAL THERAPY CERVICAL TREATMENT NOTE   Patient Name: Kristen Mahoney MRN: [id number] DOB:03/06/1946, 77 y.o., female Today's Date: 05/05/2023  END OF SESSION:  PT End of Session - 05/05/23 1102     Visit Number 2    Date for PT Re-Evaluation 06/09/23    Authorization Type BCBS MEDICARE    PT Start Time 1102    PT Stop Time 1145    PT Time Calculation (min) 43 min    Activity Tolerance Patient tolerated treatment well    Behavior During Therapy WFL for tasks assessed/performed             Past Medical History:  Diagnosis Date   Allergy    seasonal   Anxiety    Arthritis    knees,all over   Basal cell carcinoma    Benign breast cyst in female    PATIENT HAS HISTORY OF BREAST CYSTS   Cataract    beginning bilateral   Diabetes mellitus    GERD (gastroesophageal reflux disease)    Headache    Herpes progenitalis    Hyperlipidemia    Hypertension    Kidney stone    Kidney stones    OSA on CPAP    Sleep apnea    cpap   Urinary, incontinence, stress female    Vertigo    Past Surgical History:  Procedure Laterality Date   ABDOMINAL HYSTERECTOMY  1986   leiomyomata   Basal cell excised     breast cystectomy     right   CHOLECYSTECTOMY  2003   COLONOSCOPY     CYSTOSCOPY/URETEROSCOPY/HOLMIUM LASER/STENT PLACEMENT Right 01/12/2022   Procedure: CYSTOSCOPY/RETROGRADE/ URETEROSCOPY/HOLMIUM LASER/STENT PLACEMENT;  Surgeon: Noel Christmas, MD;  Location: [facility];  Service: Urology;  Laterality: Right;   ear cystectomy     ESOPHAGOGASTRODUODENOSCOPY     KNEE ARTHROSCOPY     right   LITHOTRIPSY     RECTOVAGINAL FISTULA CLOSURE     WISDOM TOOTH EXTRACTION     Patient Active Problem List   Diagnosis Date Noted   Degenerative disc disease, lumbar 09/09/2022   Thoracic back pain 05/28/2022   Central sleep apnea syndrome 09/07/2021   Poor compliance with continuous positive airway pressure treatment 09/07/2021   Episodic tension-type headache, not  intractable 09/07/2021   Gastroesophageal reflux disease without esophagitis 04/03/2021   History of weight loss 04/03/2021   Poor compliance with CPAP treatment 04/03/2021   History of total vaginal hysterectomy (TVH) 03/11/2021   Acute renal failure superimposed on stage 3b chronic kidney disease (HCC) 11/06/2019   Osteoarthritis of right knee 09/21/2019   Rectal pain 08/23/2017   Rectal bleeding 08/23/2017   Incontinence of feces 07/14/2017   HTN (hypertension), malignant 07/14/2017   Anxiety and depression 07/14/2017   Weight loss, unintentional 07/14/2017   Gas pain 06/17/2017   Bloating 06/17/2017   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   OSA on CPAP 07/13/2016   Trichomonas vaginitis 08/12/2015   Type 2 diabetes mellitus without complications (HCC) 03/17/2015   Kidney stone    DIARRHEA, INFECTIOUS 09/04/2009   BLOOD IN STOOL-MELENA 09/04/2009   Abdominal pain, left lower quadrant 09/04/2009   BLOOD IN STOOL, OCCULT 09/04/2009   HYPERLIPIDEMIA 12/25/2008   Reactive depression 12/25/2008   GERD 12/25/2008   HIATAL HERNIA 12/25/2008   Diverticulosis of colon 12/25/2008   IRRITABLE BOWEL SYNDROME 12/25/2008   RECTOVAGINAL FISTULA 12/25/2008   MITRAL VALVE PROLAPSE, HX OF 12/25/2008   Hiatal hernia 12/25/2008    PCP: Jarrett Soho,  PA-C   REFERRING PROVIDER: Rodolph Bong, MD  REFERRING DIAG: M54.2 (ICD-10-CM) - Neck pain  THERAPY DIAG:  Cervicalgia  Cramp and spasm  Muscle weakness (generalized)  Difficulty in walking, not elsewhere classified  Rationale for Evaluation and Treatment: Rehabilitation  ONSET DATE: 03/15/2023  SUBJECTIVE:                                                                                                                                                                                                         SUBJECTIVE STATEMENT: Patient states she is doing ok.  No significant increase in pain but just not a lot better.  Hand  dominance: Right  PERTINENT HISTORY:  Scoliosis  PAIN:  05/05/23: Are you having pain?  5-8/10 but occasionally as low as 2-3  PRECAUTIONS: None  WEIGHT BEARING RESTRICTIONS: No  FALLS:  Has patient fallen in last 6 months? No  LIVING ENVIRONMENT: Lives with: lives alone Lives in: House/apartment  OCCUPATION: retired  PLOF: Independent, Independent with basic ADLs, Independent with household mobility without device, Independent with community mobility with device, Independent with homemaking with ambulation, Independent with gait, and Independent with transfers  PATIENT GOALS: She would like to get rid of the pain and be able to turn her head without pain.   NEXT MD VISIT: prn  OBJECTIVE:   DIAGNOSTIC FINDINGS:  Narrative & Impression  CLINICAL DATA:  Neck pain.  Head injury 1 month ago.   EXAM: CERVICAL SPINE - 2-3 VIEW   COMPARISON:  Cervical MRI 02/24/2006.   FINDINGS: The prevertebral soft tissues are normal. The alignment is anatomic through T1. There is no evidence of acute fracture or traumatic subluxation. The C1-2 articulation appears normal in the AP projection. There is multilevel spondylosis with disc space narrowing, endplate osteophytes and facet hypertrophy, greatest at C5-6 and C6-7. There are scattered facet degenerative changes. The spondylosis appears similar to previous MRI.   IMPRESSION: No evidence of acute cervical spine fracture, traumatic subluxation or static signs of instability. Multilevel spondylosis, similar to previous MR    PATIENT SURVEYS:  NDI 17 (34%)  COGNITION: Overall cognitive status: Within functional limits for tasks assessed  SENSATION: WFL  POSTURE: rounded shoulders, forward head, and thoracic and lumbar scoliosis  PALPATION: Tender over sub occiptal area.  Taut bands and trigger points bilateral upper traps, levator paraspinals and parascapular areas.   CERVICAL ROM:   Active ROM A/PROM (deg) eval   Flexion WNL  Extension WNL  Right lateral flexion 30  Left lateral flexion 55  Right rotation 58  Left  rotation 60   (Blank rows = not tested)  UPPER EXTREMITY ROM:  WFL, left shoulder slightly more restricted than left.   UPPER EXTREMITY MMT:  Generally 4/5 with exception of left shoulder scaption with IR and ER which were 3+/5  CERVICAL SPECIAL TESTS:  Spurling's test: Negative and Distraction test: Negative   TODAY'S TREATMENT:                                                                                                                              DATE: 05/05/23 Nustep x 5 min level 2 (PT present to discuss status) Tband postural exercises with yellow tband: shoulder ext, rows, bilateral shoulder ER, bilateral shoulder horizontal abduction.   Trigger Point Dry-Needling  Treatment instructions: Expect mild to moderate muscle soreness. S/S of pneumothorax if dry needled over a lung field, and to seek immediate medical attention should they occur. Patient verbalized understanding of these instructions and education.  Patient Consent Given: Yes Education handout provided: Yes Muscles treated: bilateral upper traps, cervical multifidi, sub occipitals Electrical stimulation performed: No Parameters: N/A Treatment response/outcome: Skilled palpation used to identify taut bands and trigger points.  Once identified, dry needling techniques used to treat these areas.  Twitch response ellicited along with palpable elongation of muscle.  Following treatment, patient reported left side felt less tight and right side felt sore.  She had visible increase in cervical rotation to left.    DATE: 04/13/23 Initial eval completed and initiated HEP   PATIENT EDUCATION:  Education details: Initiated HEP and discussed pain control using ice or heat along with medication Person educated: Patient Education method: Explanation, Demonstration, Verbal cues, and Handouts Education comprehension:  verbalized understanding, returned demonstration, and verbal cues required  HOME EXERCISE PROGRAM: Access Code: [email or web address] URL: [email or web address] Date: 05/05/2023 Prepared by: Mikey Kirschner  Exercises - Seated Cervical Retraction  - 1 x daily - 7 x weekly - 1 sets - 10 reps - 2-3 sec hold - Seated Cervical Rotation AROM  - 1 x daily - 7 x weekly - 1 sets - 10 reps - 2-3 sec hold - Seated Assisted Cervical Rotation with Towel  - 1 x daily - 7 x weekly - 1 sets - 10 reps - 2-3 sec hold - Seated Cervical Sidebending AROM  - 1 x daily - 7 x weekly - 1 sets - 10 reps - 2-3 sec hold - Shoulder extension with resistance - Neutral  - 2 x daily - 7 x weekly - 2 sets - 10 reps - Standing Shoulder Row with Anchored Resistance  - 2 x daily - 7 x weekly - 2 sets - 10 reps - Shoulder External Rotation and Scapular Retraction with Resistance  - 2 x daily - 7 x weekly - 2 sets - 10 reps - Standing Shoulder Horizontal Abduction with Resistance  - 2 x daily - 7 x weekly - 2 sets - 10 reps  ASSESSMENT:  CLINICAL IMPRESSION: Brittanie was  able to complete postural exercises with no increase in pain but fatigued very easily.  She needed mod verbal cues for correct technique.  She was fairly timid and guarded during needling, especially with upper traps.  We were unable to complete the needling on the right upper trap as patient began tensing up and drew her right shoulder up while needle was still in.  We stopped the DN at this point due to patients apprehension.  However, upon sitting up, she stated her pain on her left side was remarkably improved and she only had some mild soreness in her right upper trap.  She is also having some vertigo symptoms that may need to be addressed.    She would benefit from skilled PT for C spine ROM, dry needling for soft tissue mobility if she is able to tolerate this and PROM to C spine.    OBJECTIVE IMPAIRMENTS: decreased ROM, decreased strength, hypomobility,  increased fascial restrictions, increased muscle spasms, impaired flexibility, postural dysfunction, and pain.   ACTIVITY LIMITATIONS: carrying, lifting, bending, sleeping, bed mobility, bathing, dressing, reach over head, and hygiene/grooming  PARTICIPATION LIMITATIONS: meal prep, cleaning, laundry, driving, shopping, community activity, yard work, and church  PERSONAL FACTORS: Age and Fitness are also affecting patient's functional outcome.   REHAB POTENTIAL: Good  CLINICAL DECISION MAKING: Stable/uncomplicated  EVALUATION COMPLEXITY: Low   GOALS: Goals reviewed with patient? Yes  SHORT TERM GOALS: Target date: 05/11/2023   Pain report to be no greater than 4/10  Baseline:  Goal status: INITIAL  2.  Patient will be independent with initial HEP  Baseline:  Goal status: INITIAL  LONG TERM GOALS: Target date: 06/09/2023   Patient to report pain no greater than 2/10  Baseline:  Goal status: INITIAL  2.  Patient to be independent with advanced HEP  Baseline:  Goal status: INITIAL  3.  Patient to be able to sleep through the night  Baseline:  Goal status: INITIAL  4.  Patient to report 85% improvement in overall symptoms Baseline:  Goal status: INITIAL  5.  NDI to improve to 60% Baseline: 34% Goal status: INITIAL  6.  Patient to be able to reach overhead to do basic grooming and dressing with left UE without pain Baseline:  Goal status: INITIAL   PLAN:  PT FREQUENCY: 2x/week  PT DURATION: 8 weeks  PLANNED INTERVENTIONS: Therapeutic exercises, Therapeutic activity, Neuromuscular re-education, Patient/Family education, Self Care, Joint mobilization, Aquatic Therapy, Dry Needling, Electrical stimulation, Spinal mobilization, Cryotherapy, Moist heat, Taping, Traction, Ultrasound, Ionotophoresis 4mg /ml Dexamethasone, Manual therapy, and Re-evaluation  PLAN FOR NEXT SESSION: Assess response to DN, progress postural strengthening, possibly UBE, DN if patient had  good response and demonstrates less apprehension.     Victorino Dike B. Umaima Scholten, PT 05/05/23 11:19 PM  [facility] [address] [place], [place] [address] Phone # [phone] Fax [phone]

## 2023-05-10 ENCOUNTER — Encounter: Payer: Medicare Other | Admitting: Family Medicine

## 2023-05-11 DIAGNOSIS — N200 Calculus of kidney: Secondary | ICD-10-CM | POA: Diagnosis not present

## 2023-05-11 DIAGNOSIS — N3021 Other chronic cystitis with hematuria: Secondary | ICD-10-CM | POA: Diagnosis not present

## 2023-05-17 ENCOUNTER — Other Ambulatory Visit: Payer: Self-pay | Admitting: Gastroenterology

## 2023-05-19 ENCOUNTER — Encounter: Payer: Self-pay | Admitting: Family Medicine

## 2023-05-19 ENCOUNTER — Ambulatory Visit (INDEPENDENT_AMBULATORY_CARE_PROVIDER_SITE_OTHER): Payer: Medicare Other | Admitting: Family Medicine

## 2023-05-19 VITALS — BP 110/60 | HR 96 | Ht 62.0 in | Wt 128.0 lb

## 2023-05-19 DIAGNOSIS — S060X9D Concussion with loss of consciousness of unspecified duration, subsequent encounter: Secondary | ICD-10-CM | POA: Diagnosis not present

## 2023-05-19 DIAGNOSIS — R519 Headache, unspecified: Secondary | ICD-10-CM | POA: Diagnosis not present

## 2023-05-19 MED ORDER — TOPIRAMATE 25 MG PO TABS: 25.0000 mg | ORAL_TABLET | Freq: Two times a day (BID) | ORAL | 1 refills | Status: DC

## 2023-05-19 NOTE — Progress Notes (Signed)
Subjective:   I, Philbert Riser, PhD, LAT, ATC acting as a scribe for Kristen Graham, MD.  Chief Complaint: Kristen Mahoney,  is a 77 y.o. female who presents for f/u concussion.  she was hit in the head by the lid of a trash can when putting bag of trash in it. Pt was last seen by Dr. Denyse Amass on 04/05/23 and was advised to proceed to neuroimaging and was advised to proceed to PT, completing 2 visits. Today, pt reports continued fatigue. No relief with dry-needling. Notes intermittent nausea. Continues to have pain in the neck and upper back.   Headache continues to be a problem as well.  Dx imaging: 04/20/23 Brain MRI  03/15/23 C-spine XR  Injury date : mid-Feb 2024 Visit #: 3  History of Present Illness:   Concussion Self-Reported Symptom Score Symptoms rated on a scale 1-6, in last 24 hours  Headache: 3   Pressure in head: 3 Neck pain: 3 Nausea or vomiting: 5 Dizziness: 0  Blurred vision: 0  Balance problems: 2 Sensitivity to light:  0 Sensitivity to noise: 0 Feeling slowed down: 3 Feeling like "in a fog": 0 "Don't feel right": 2 Difficulty concentrating: 2 Difficulty remembering: 2 Fatigue or low energy: 3 Confusion: 0 Drowsiness: 2 More emotional: 2 Irritability: 2 Sadness: 2 Nervous or anxious: 2 Trouble falling asleep: 0   Total # of Symptoms: 15/22 Total Symptom Score: 38/132  Previous Total # of Symptoms: 19/22 Previous Symptom Score: 57/132  Tinnitus: No  Review of Systems: No fevers or chills    Review of History: Hypertension  Objective:    Physical Examination Vitals:   05/19/23 1501  BP: 110/60  Pulse: 96  SpO2: 97%   MSK: C-spine: Tender palpation paraspinal musculature.  Decreased cervical motion. Neuro: Alert and oriented normal coordination and gait. Psych: Normal speech thought process and affect.   EXAM: MRI HEAD WITHOUT CONTRAST   TECHNIQUE: Multiplanar, multiecho pulse sequences of the brain and surrounding structures were obtained  without intravenous contrast.   COMPARISON:  Report from head CT 07/31/1999 (images unavailable).   FINDINGS: Brain:   No age advanced or lobar predominant parenchymal atrophy.   Multifocal T2 FLAIR hyperintense signal abnormality within the cerebral white matter, nonspecific but compatible with mild for age chronic small vessel ischemic disease.   There is no acute infarct.   No evidence of an intracranial mass.   No chronic intracranial blood products.   No extra-axial fluid collection.   No midline shift.   Vascular: Maintained flow voids within the proximal large arterial vessels.   Skull and upper cervical spine: No focal suspicious marrow lesion.   Sinuses/Orbits: Prior right ocular lens replacement. No significant paranasal sinus disease.   Other: Trace fluid within the left mastoid air cells.   IMPRESSION: 1.  No evidence of an acute intracranial abnormality. 2. Chronic small-vessel ischemic changes within the cerebral white matter, mild for age.     Electronically Signed   By: Jackey Loge D.O.   On: 04/20/2023 14:36   I, Kristen Mahoney, personally (independently) visualized and performed the interpretation of the images attached in this note.    Assessment and Plan   77 y.o. female with concussion now ongoing for about 3 months. She has had 2 visits with physical therapy since her last visit with me.  This does seem to be helping but it is a little too early to tell how helpful physical therapy will be for her. She did have a  normal brain MRI since the last visit.  Plan to continue PT and add Topamax for headache control.  She was intolerant of nortriptyline.  Recheck in 1 month.       Action/Discussion: Reviewed diagnosis, management options, expected outcomes, and the reasons for scheduled and emergent follow-up. Questions were adequately answered. Patient expressed verbal understanding and agreement with the following plan.     Patient  Education: Reviewed with patient the risks (i.e, a repeat concussion, post-concussion syndrome, second-impact syndrome) of returning to play prior to complete resolution, and thoroughly reviewed the signs and symptoms of concussion.Reviewed need for complete resolution of all symptoms, with rest AND exertion, prior to return to play. Reviewed red flags for urgent medical evaluation: worsening symptoms, nausea/vomiting, intractable headache, musculoskeletal changes, focal neurological deficits. [facility] Concussion Care Plan, which clearly outlines the plans stated above, was given to patient.   Level of service: Total encounter time 30 minutes including face-to-face time with the patient and, reviewing past medical record, and charting on the date of service.        After Visit Summary printed out and provided to patient as appropriate.  The above documentation has been reviewed and is accurate and complete Kristen Mahoney

## 2023-05-19 NOTE — Patient Instructions (Addendum)
Thank you for coming in today.   Try a heating pad.   Continue PT.   START topomax for headache twice daily.   Recheck in 1 month.

## 2023-05-25 DIAGNOSIS — I10 Essential (primary) hypertension: Secondary | ICD-10-CM | POA: Diagnosis not present

## 2023-05-25 DIAGNOSIS — E119 Type 2 diabetes mellitus without complications: Secondary | ICD-10-CM | POA: Diagnosis not present

## 2023-05-25 DIAGNOSIS — F419 Anxiety disorder, unspecified: Secondary | ICD-10-CM | POA: Diagnosis not present

## 2023-05-25 DIAGNOSIS — Z0000 Encounter for general adult medical examination without abnormal findings: Secondary | ICD-10-CM | POA: Diagnosis not present

## 2023-05-25 DIAGNOSIS — E785 Hyperlipidemia, unspecified: Secondary | ICD-10-CM | POA: Diagnosis not present

## 2023-05-26 ENCOUNTER — Ambulatory Visit: Payer: Medicare Other

## 2023-05-26 DIAGNOSIS — M6281 Muscle weakness (generalized): Secondary | ICD-10-CM

## 2023-05-26 DIAGNOSIS — R262 Difficulty in walking, not elsewhere classified: Secondary | ICD-10-CM

## 2023-05-26 DIAGNOSIS — M542 Cervicalgia: Secondary | ICD-10-CM | POA: Diagnosis not present

## 2023-05-26 DIAGNOSIS — R252 Cramp and spasm: Secondary | ICD-10-CM

## 2023-05-26 NOTE — Therapy (Signed)
OUTPATIENT PHYSICAL THERAPY CERVICAL TREATMENT NOTE   Patient Name: Kristen Mahoney MRN: [id number] DOB:10-29-1946, 77 y.o., female Today's Date: 05/26/2023  END OF SESSION:  PT End of Session - 05/26/23 1052     Visit Number 3    Date for PT Re-Evaluation 06/09/23    Authorization Type [employer] MEDICARE    Progress Note Due on Visit 10    PT Start Time 1056    PT Stop Time 1144    PT Time Calculation (min) 48 min    Activity Tolerance Patient tolerated treatment well    Behavior During Therapy WFL for tasks assessed/performed             Past Medical History:  Diagnosis Date   Allergy    seasonal   Anxiety    Arthritis    knees,all over   Basal cell carcinoma    Benign breast cyst in female    PATIENT HAS HISTORY OF BREAST CYSTS   Cataract    beginning bilateral   Diabetes mellitus    GERD (gastroesophageal reflux disease)    Headache    Herpes progenitalis    Hyperlipidemia    Hypertension    Kidney stone    Kidney stones    OSA on CPAP    Sleep apnea    cpap   Urinary, incontinence, stress female    Vertigo    Past Surgical History:  Procedure Laterality Date   ABDOMINAL HYSTERECTOMY  1986   leiomyomata   Basal cell excised     breast cystectomy     right   CHOLECYSTECTOMY  2003   COLONOSCOPY     CYSTOSCOPY/URETEROSCOPY/HOLMIUM LASER/STENT PLACEMENT Right 01/12/2022   Procedure: CYSTOSCOPY/RETROGRADE/ URETEROSCOPY/HOLMIUM LASER/STENT PLACEMENT;  Surgeon: Noel Christmas, MD;  Location: [facility];  Service: Urology;  Laterality: Right;   ear cystectomy     ESOPHAGOGASTRODUODENOSCOPY     KNEE ARTHROSCOPY     right   LITHOTRIPSY     RECTOVAGINAL FISTULA CLOSURE     WISDOM TOOTH EXTRACTION     Patient Active Problem List   Diagnosis Date Noted   Degenerative disc disease, lumbar 09/09/2022   Thoracic back pain 05/28/2022   Central sleep apnea syndrome 09/07/2021   Poor compliance with continuous positive airway pressure treatment 09/07/2021    Episodic tension-type headache, not intractable 09/07/2021   Gastroesophageal reflux disease without esophagitis 04/03/2021   History of weight loss 04/03/2021   Poor compliance with CPAP treatment 04/03/2021   History of total vaginal hysterectomy (TVH) 03/11/2021   Acute renal failure superimposed on stage 3b chronic kidney disease (HCC) 11/06/2019   Osteoarthritis of right knee 09/21/2019   Rectal pain 08/23/2017   Rectal bleeding 08/23/2017   Incontinence of feces 07/14/2017   HTN (hypertension), malignant 07/14/2017   Anxiety and depression 07/14/2017   Weight loss, unintentional 07/14/2017   Gas pain 06/17/2017   Bloating 06/17/2017   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   OSA on CPAP 07/13/2016   Trichomonas vaginitis 08/12/2015   Type 2 diabetes mellitus without complications (HCC) 03/17/2015   Kidney stone    DIARRHEA, INFECTIOUS 09/04/2009   BLOOD IN STOOL-MELENA 09/04/2009   Abdominal pain, left lower quadrant 09/04/2009   BLOOD IN STOOL, OCCULT 09/04/2009   HYPERLIPIDEMIA 12/25/2008   Reactive depression 12/25/2008   GERD 12/25/2008   HIATAL HERNIA 12/25/2008   Diverticulosis of colon 12/25/2008   IRRITABLE BOWEL SYNDROME 12/25/2008   RECTOVAGINAL FISTULA 12/25/2008   MITRAL VALVE PROLAPSE, HX OF 12/25/2008  Hiatal hernia 12/25/2008    PCP: Jarrett Soho, PA-C   REFERRING PROVIDER: Rodolph Bong, MD  REFERRING DIAG: M54.2 (ICD-10-CM) - Neck pain  THERAPY DIAG:  Cervicalgia  Cramp and spasm  Muscle weakness (generalized)  Difficulty in walking, not elsewhere classified  Rationale for Evaluation and Treatment: Rehabilitation  ONSET DATE: 03/15/2023  SUBJECTIVE:                                                                                                                                                                                                         SUBJECTIVE STATEMENT: Patient states she saw PA, Courtney, for follow up.  MRI was negative for  any concussion symptoms or injury.  She states her pain is a little better.  She rates this at 3/10.   Hand dominance: Right  PERTINENT HISTORY:  Scoliosis  PAIN:  05/26/23: Are you having pain?  3/10  PRECAUTIONS: None  WEIGHT BEARING RESTRICTIONS: No  FALLS:  Has patient fallen in last 6 months? No  LIVING ENVIRONMENT: Lives with: lives alone Lives in: House/apartment  OCCUPATION: retired  PLOF: Independent, Independent with basic ADLs, Independent with household mobility without device, Independent with community mobility with device, Independent with homemaking with ambulation, Independent with gait, and Independent with transfers  PATIENT GOALS: She would like to get rid of the pain and be able to turn her head without pain.   NEXT MD VISIT: prn  OBJECTIVE:   DIAGNOSTIC FINDINGS:  Narrative & Impression  CLINICAL DATA:  Neck pain.  Head injury 1 month ago.   EXAM: CERVICAL SPINE - 2-3 VIEW   COMPARISON:  Cervical MRI 02/24/2006.   FINDINGS: The prevertebral soft tissues are normal. The alignment is anatomic through T1. There is no evidence of acute fracture or traumatic subluxation. The C1-2 articulation appears normal in the AP projection. There is multilevel spondylosis with disc space narrowing, endplate osteophytes and facet hypertrophy, greatest at C5-6 and C6-7. There are scattered facet degenerative changes. The spondylosis appears similar to previous MRI.   IMPRESSION: No evidence of acute cervical spine fracture, traumatic subluxation or static signs of instability. Multilevel spondylosis, similar to previous MR    PATIENT SURVEYS:  NDI 17 (34%)  COGNITION: Overall cognitive status: Within functional limits for tasks assessed  SENSATION: WFL  POSTURE: rounded shoulders, forward head, and thoracic and lumbar scoliosis  PALPATION: Tender over sub occiptal area.  Taut bands and trigger points bilateral upper traps, levator paraspinals and  parascapular areas.   CERVICAL ROM:   Active ROM A/PROM (deg) eval  Flexion  WNL  Extension WNL  Right lateral flexion 30  Left lateral flexion 55  Right rotation 58  Left rotation 60   (Blank rows = not tested)  UPPER EXTREMITY ROM:  WFL, left shoulder slightly more restricted than left.   UPPER EXTREMITY MMT:  Generally 4/5 with exception of left shoulder scaption with IR and ER which were 3+/5  CERVICAL SPECIAL TESTS:  Spurling's test: Negative and Distraction test: Negative   TODAY'S TREATMENT:                                                                                                                              DATE: 05/26/23 UBE x 5 min (2.5/2.5) level 1 (PT present to discuss status) 3 way scapular stabilization with green loop x 5 each side 4 D ball rolls x 20 each direction both Ue's Bilateral shoulder extension and rows with red band x 20 each Bilateral shoulder ER and horizontal abduction 2 x 10 each  Trigger Point Dry-Needling  Treatment instructions: Expect mild to moderate muscle soreness. S/S of pneumothorax if dry needled over a lung field, and to seek immediate medical attention should they occur. Patient verbalized understanding of these instructions and education. Patient Consent Given: Yes Education handout provided: Yes Muscles treated: bilateral upper traps, cervical multifidi Electrical stimulation performed: No Parameters: N/A Treatment response/outcome: Skilled palpation used to identify taut bands and trigger points.  Once identified, dry needling techniques used to treat these areas.  Twitch response ellicited along with palpable elongation of muscle.  Following treatment, patient reported significant decrease in tightness and pain.  She was able to side bend and rotate with increased ease.    DATE: 05/05/23 Nustep x 5 min level 2 (PT present to discuss status) Tband postural exercises with yellow tband: shoulder ext, rows, bilateral shoulder  ER, bilateral shoulder horizontal abduction.   Trigger Point Dry-Needling  Treatment instructions: Expect mild to moderate muscle soreness. S/S of pneumothorax if dry needled over a lung field, and to seek immediate medical attention should they occur. Patient verbalized understanding of these instructions and education.  Patient Consent Given: Yes Education handout provided: Yes Muscles treated: bilateral upper traps, cervical multifidi, sub occipitals Electrical stimulation performed: No Parameters: N/A Treatment response/outcome: Skilled palpation used to identify taut bands and trigger points.  Once identified, dry needling techniques used to treat these areas.  Twitch response ellicited along with palpable elongation of muscle.  Following treatment, patient reported left side felt less tight and right side felt sore.  She had visible increase in cervical rotation to left.    DATE: 04/13/23 Initial eval completed and initiated HEP   PATIENT EDUCATION:  Education details: Initiated HEP and discussed pain control using ice or heat along with medication Person educated: Patient Education method: Explanation, Demonstration, Verbal cues, and Handouts Education comprehension: verbalized understanding, returned demonstration, and verbal cues required  HOME EXERCISE PROGRAM: Access Code: [email or web address] URL: [email or web address] Date: 05/05/2023 Prepared by: Mikey Kirschner  Exercises - Seated Cervical Retraction  - 1 x daily - 7 x weekly - 1 sets - 10 reps - 2-3 sec hold - Seated Cervical Rotation AROM  - 1 x daily - 7 x weekly - 1 sets - 10 reps - 2-3 sec hold - Seated Assisted Cervical Rotation with Towel  - 1 x daily - 7 x weekly - 1 sets - 10 reps - 2-3 sec hold - Seated Cervical Sidebending AROM  - 1 x daily - 7 x weekly - 1 sets - 10 reps - 2-3 sec hold - Shoulder extension with resistance - Neutral  - 2 x daily - 7 x weekly - 2 sets - 10 reps - Standing Shoulder Row with  Anchored Resistance  - 2 x daily - 7 x weekly - 2 sets - 10 reps - Shoulder External Rotation and Scapular Retraction with Resistance  - 2 x daily - 7 x weekly - 2 sets - 10 reps - Standing Shoulder Horizontal Abduction with Resistance  - 2 x daily - 7 x weekly - 2 sets - 10 reps  ASSESSMENT:  CLINICAL IMPRESSION: Stephonie did much better today with all exercises and DN.  She was able to tolerate bilateral upper traps and one on each side for cervical multifidi.  She has a lot of soft tissue and excessive lordosis of the C spine in prone.  This makes DN challenging in that area.  Overall, she is doing very well.  She seems more concerned about her legs currently than her neck and shoulder.    She would benefit from skilled PT for C spine ROM, dry needling for soft tissue mobility if she is able to tolerate this and PROM to C spine.    OBJECTIVE IMPAIRMENTS: decreased ROM, decreased strength, hypomobility, increased fascial restrictions, increased muscle spasms, impaired flexibility, postural dysfunction, and pain.   ACTIVITY LIMITATIONS: carrying, lifting, bending, sleeping, bed mobility, bathing, dressing, reach over head, and hygiene/grooming  PARTICIPATION LIMITATIONS: meal prep, cleaning, laundry, driving, shopping, community activity, yard work, and church  PERSONAL FACTORS: Age and Fitness are also affecting patient's functional outcome.   REHAB POTENTIAL: Good  CLINICAL DECISION MAKING: Stable/uncomplicated  EVALUATION COMPLEXITY: Low   GOALS: Goals reviewed with patient? Yes  SHORT TERM GOALS: Target date: 05/11/2023   Pain report to be no greater than 4/10  Baseline:  Goal status: INITIAL  2.  Patient will be independent with initial HEP  Baseline:  Goal status: INITIAL  LONG TERM GOALS: Target date: 06/09/2023   Patient to report pain no greater than 2/10  Baseline:  Goal status: INITIAL  2.  Patient to be independent with advanced HEP  Baseline:  Goal status:  INITIAL  3.  Patient to be able to sleep through the night  Baseline:  Goal status: INITIAL  4.  Patient to report 85% improvement in overall symptoms Baseline:  Goal status: INITIAL  5.  NDI to improve to 60% Baseline: 34% Goal status: INITIAL  6.  Patient to be able to reach overhead to do basic grooming and dressing with left UE without pain Baseline:  Goal status: INITIAL   PLAN:  PT FREQUENCY: 2x/week  PT DURATION: 8 weeks  PLANNED INTERVENTIONS: Therapeutic exercises, Therapeutic activity, Neuromuscular re-education, Patient/Family education, Self Care, Joint mobilization, Aquatic Therapy, Dry Needling, Electrical stimulation, Spinal mobilization, Cryotherapy, Moist heat, Taping, Traction, Ultrasound, Ionotophoresis 4mg /ml Dexamethasone, Manual therapy, and Re-evaluation  PLAN FOR NEXT SESSION: Assess response to DN #2, progress postural strengthening, possibly  UBE, DN if patient had good response and demonstrates less apprehension.     Victorino Dike B. Sylvester Minton, PT 05/26/23 11:49 AM  [facility] [address], [address] [place], [place] [address] Phone # [phone] Fax [phone]

## 2023-06-02 ENCOUNTER — Ambulatory Visit (INDEPENDENT_AMBULATORY_CARE_PROVIDER_SITE_OTHER): Payer: Medicare Other | Admitting: Nurse Practitioner

## 2023-06-02 ENCOUNTER — Ambulatory Visit: Payer: Medicare Other

## 2023-06-02 ENCOUNTER — Encounter: Payer: Self-pay | Admitting: Nurse Practitioner

## 2023-06-02 VITALS — BP 110/70 | HR 87 | Temp 98.7°F | Wt 130.0 lb

## 2023-06-02 DIAGNOSIS — R300 Dysuria: Secondary | ICD-10-CM | POA: Diagnosis not present

## 2023-06-02 DIAGNOSIS — M545 Low back pain, unspecified: Secondary | ICD-10-CM

## 2023-06-02 NOTE — Progress Notes (Signed)
   Acute Office Visit  Subjective:    Patient ID: Kristen Mahoney, female    DOB: 23-Apr-1946, 77 y.o.   MRN: [id number]   HPI 77 y.o. presents today for lower back pain x 1-2 days. Has had some mild urinary frequency without dysuria, urgency, or hematuria. Worked in the yard picking up sticks prior to when pain started. She can feel it also in her mid back when she takes a deep breath. Has been doing acupuncture for back pain as recommended by PCP. She is worried it is a UTI.   No LMP recorded. Patient has had a hysterectomy.    Review of Systems  Constitutional: Negative.   Genitourinary:  Positive for frequency. Negative for difficulty urinating, dysuria, flank pain, hematuria, urgency, vaginal discharge and vaginal pain.  Musculoskeletal:  Positive for back pain (Lower).       Objective:    Physical Exam Constitutional:      Appearance: Normal appearance.  Abdominal:     Tenderness: There is no right CVA tenderness or left CVA tenderness.     BP 110/70   Pulse 87   Temp 98.7 F (37.1 C)   Wt 130 lb (59 kg)   SpO2 100%   BMI 23.78 kg/m  Wt Readings from Last 3 Encounters:  06/02/23 130 lb (59 kg)  05/19/23 128 lb (58.1 kg)  04/05/23 128 lb (58.1 kg)        UA negative  Assessment & Plan:   Problem List Items Addressed This Visit   None Visit Diagnoses     Acute midline low back pain without sciatica    -  Primary   Relevant Orders   Urinalysis,Complete w/RFL Culture (Completed)      Plan: UA negative. Denies vaginal symptoms. Likely musculoskeletal. Recommend rest, Tylenol, and heat. If no improvement she will follow up with PCP.      Olivia Mackie DNP, 3:38 PM 06/02/2023

## 2023-06-03 ENCOUNTER — Telehealth: Payer: Self-pay | Admitting: Gastroenterology

## 2023-06-03 LAB — URINALYSIS, COMPLETE W/RFL CULTURE
Bilirubin Urine: NEGATIVE
Glucose, UA: NEGATIVE
Hgb urine dipstick: NEGATIVE
Hyaline Cast: NONE SEEN /LPF
Ketones, ur: NEGATIVE
Nitrites, Initial: NEGATIVE
Protein, ur: NEGATIVE
RBC / HPF: NONE SEEN /HPF (ref 0–2)
Specific Gravity, Urine: 1.02 (ref 1.001–1.035)
pH: 7 (ref 5.0–8.0)

## 2023-06-03 LAB — URINE CULTURE
MICRO NUMBER:: 15049804
SPECIMEN QUALITY:: ADEQUATE

## 2023-06-03 LAB — CULTURE INDICATED

## 2023-06-03 NOTE — Telephone Encounter (Signed)
Patient called in requesting to speak with a nurse, she stated she is having a severe diarrhea and some bloodie stools,want to know what to take to relieve Gi symptoms .Please advise

## 2023-06-03 NOTE — Telephone Encounter (Signed)
Pt states she saw her GYN because she thought she had a uti due to back pain but she did not. She is having diarrhea with a little blood in it and is taking hyoscyamine and Imodium but concerned about the back pain. Scheduled her to see Willette Cluster NP 06/09/23 at 2pm. She is aware of the appt. Discussed with pt that she could call her pcp to be seen if she needs to be seen prior to her scheduled appt.

## 2023-06-07 DIAGNOSIS — H25012 Cortical age-related cataract, left eye: Secondary | ICD-10-CM | POA: Diagnosis not present

## 2023-06-07 DIAGNOSIS — H2512 Age-related nuclear cataract, left eye: Secondary | ICD-10-CM | POA: Diagnosis not present

## 2023-06-08 ENCOUNTER — Ambulatory Visit: Payer: Medicare Other | Attending: Family Medicine

## 2023-06-08 DIAGNOSIS — R252 Cramp and spasm: Secondary | ICD-10-CM | POA: Insufficient documentation

## 2023-06-08 DIAGNOSIS — R293 Abnormal posture: Secondary | ICD-10-CM | POA: Insufficient documentation

## 2023-06-08 DIAGNOSIS — R262 Difficulty in walking, not elsewhere classified: Secondary | ICD-10-CM | POA: Diagnosis not present

## 2023-06-08 DIAGNOSIS — M542 Cervicalgia: Secondary | ICD-10-CM | POA: Diagnosis not present

## 2023-06-08 DIAGNOSIS — M6281 Muscle weakness (generalized): Secondary | ICD-10-CM | POA: Insufficient documentation

## 2023-06-08 NOTE — Therapy (Signed)
OUTPATIENT PHYSICAL THERAPY CERVICAL RECERT NOTE   Patient Name: GLORIAN MCDONELL MRN: [id number] DOB:1946/06/26, 77 y.o., female Today's Date: 06/08/2023  END OF SESSION:  PT End of Session - 06/08/23 1107     Visit Number 4    Date for PT Re-Evaluation 08/03/23    Authorization Type [employer] MEDICARE    Progress Note Due on Visit 10    PT Start Time 1100    PT Stop Time 1145    PT Time Calculation (min) 45 min    Activity Tolerance Patient tolerated treatment well    Behavior During Therapy WFL for tasks assessed/performed             Past Medical History:  Diagnosis Date   Allergy    seasonal   Anxiety    Arthritis    knees,all over   Basal cell carcinoma    Benign breast cyst in female    PATIENT HAS HISTORY OF BREAST CYSTS   Cataract    beginning bilateral   Diabetes mellitus    GERD (gastroesophageal reflux disease)    Headache    Herpes progenitalis    Hyperlipidemia    Hypertension    Kidney stone    Kidney stones    OSA on CPAP    Sleep apnea    cpap   Urinary, incontinence, stress female    Vertigo    Past Surgical History:  Procedure Laterality Date   ABDOMINAL HYSTERECTOMY  1986   leiomyomata   Basal cell excised     breast cystectomy     right   CHOLECYSTECTOMY  2003   COLONOSCOPY     CYSTOSCOPY/URETEROSCOPY/HOLMIUM LASER/STENT PLACEMENT Right 01/12/2022   Procedure: CYSTOSCOPY/RETROGRADE/ URETEROSCOPY/HOLMIUM LASER/STENT PLACEMENT;  Surgeon: Noel Christmas, MD;  Location: [facility];  Service: Urology;  Laterality: Right;   ear cystectomy     ESOPHAGOGASTRODUODENOSCOPY     KNEE ARTHROSCOPY     right   LITHOTRIPSY     RECTOVAGINAL FISTULA CLOSURE     WISDOM TOOTH EXTRACTION     Patient Active Problem List   Diagnosis Date Noted   Degenerative disc disease, lumbar 09/09/2022   Thoracic back pain 05/28/2022   Central sleep apnea syndrome 09/07/2021   Poor compliance with continuous positive airway pressure treatment 09/07/2021   Episodic  tension-type headache, not intractable 09/07/2021   Gastroesophageal reflux disease without esophagitis 04/03/2021   History of weight loss 04/03/2021   Poor compliance with CPAP treatment 04/03/2021   History of total vaginal hysterectomy (TVH) 03/11/2021   Acute renal failure superimposed on stage 3b chronic kidney disease (HCC) 11/06/2019   Osteoarthritis of right knee 09/21/2019   Rectal pain 08/23/2017   Rectal bleeding 08/23/2017   Incontinence of feces 07/14/2017   HTN (hypertension), malignant 07/14/2017   Anxiety and depression 07/14/2017   Weight loss, unintentional 07/14/2017   Gas pain 06/17/2017   Bloating 06/17/2017   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   OSA on CPAP 07/13/2016   Trichomonas vaginitis 08/12/2015   Type 2 diabetes mellitus without complications (HCC) 03/17/2015   Kidney stone    DIARRHEA, INFECTIOUS 09/04/2009   BLOOD IN STOOL-MELENA 09/04/2009   Abdominal pain, left lower quadrant 09/04/2009   BLOOD IN STOOL, OCCULT 09/04/2009   HYPERLIPIDEMIA 12/25/2008   Reactive depression 12/25/2008   GERD 12/25/2008   HIATAL HERNIA 12/25/2008   Diverticulosis of colon 12/25/2008   IRRITABLE BOWEL SYNDROME 12/25/2008   RECTOVAGINAL FISTULA 12/25/2008   MITRAL VALVE PROLAPSE, HX OF 12/25/2008  Hiatal hernia 12/25/2008    PCP: Jarrett Soho, PA-C   REFERRING PROVIDER: Rodolph Bong, MD  REFERRING DIAG: M54.2 (ICD-10-CM) - Neck pain  THERAPY DIAG:  Cervicalgia  Cramp and spasm  Muscle weakness (generalized)  Abnormal posture  Difficulty in walking, not elsewhere classified  Rationale for Evaluation and Treatment: Rehabilitation  ONSET DATE: 03/15/2023  SUBJECTIVE:                                                                                                                                                                                                         SUBJECTIVE STATEMENT: Patient reports "I just hurt all over"  Pain level reported at  6/10.  She describes issues with her bowels and being extremely constipated over the past week.  She finally went after taking her regimen of medication for this but she has been feeling miserable due to this.  She also suffers from diverticulitis so when she does become constipated, this flares up.  She admits her stool was bloody.    Hand dominance: Right  PERTINENT HISTORY:  Scoliosis  PAIN:  05/26/23: Are you having pain?  3/10 06/08/23: 6/10  PRECAUTIONS: None  WEIGHT BEARING RESTRICTIONS: No  FALLS:  Has patient fallen in last 6 months? No  LIVING ENVIRONMENT: Lives with: lives alone Lives in: House/apartment  OCCUPATION: retired  PLOF: Independent, Independent with basic ADLs, Independent with household mobility without device, Independent with community mobility with device, Independent with homemaking with ambulation, Independent with gait, and Independent with transfers  PATIENT GOALS: She would like to get rid of the pain and be able to turn her head without pain.   NEXT MD VISIT: prn  OBJECTIVE:   DIAGNOSTIC FINDINGS:  Narrative & Impression  CLINICAL DATA:  Neck pain.  Head injury 1 month ago.   EXAM: CERVICAL SPINE - 2-3 VIEW   COMPARISON:  Cervical MRI 02/24/2006.   FINDINGS: The prevertebral soft tissues are normal. The alignment is anatomic through T1. There is no evidence of acute fracture or traumatic subluxation. The C1-2 articulation appears normal in the AP projection. There is multilevel spondylosis with disc space narrowing, endplate osteophytes and facet hypertrophy, greatest at C5-6 and C6-7. There are scattered facet degenerative changes. The spondylosis appears similar to previous MRI.   IMPRESSION: No evidence of acute cervical spine fracture, traumatic subluxation or static signs of instability. Multilevel spondylosis, similar to previous MR    PATIENT SURVEYS:  NDI 17 (34%) 06/08/23: 36%  COGNITION: Overall cognitive status:  Within functional limits for tasks assessed  SENSATION: [facility]  POSTURE: rounded shoulders, forward head, and thoracic and lumbar scoliosis  PALPATION: Tender over sub occiptal area.  Taut bands and trigger points bilateral upper traps, levator paraspinals and parascapular areas.   CERVICAL ROM:   Active ROM A/PROM (deg) eval A/PROM (deg) 06/08/23  Flexion WNL WNL  Extension WNL WNL  Right lateral flexion 30 35  Left lateral flexion 55 55  Right rotation 58 60  Left rotation 60 60   (Blank rows = not tested)  UPPER EXTREMITY ROM:  WFL, left shoulder slightly more restricted than left.   UPPER EXTREMITY MMT:  Generally 4/5 with exception of left shoulder scaption with IR and ER which were 3+/5  CERVICAL SPECIAL TESTS:  Spurling's test: Negative and Distraction test: Negative   TODAY'S TREATMENT:                                                                                                                              DATE: 06/08/23 UBE x 6 min (3/3) level 1 (PT present to discuss status) (Bathroom break x approx 5-7 min due to stomach issues) Seated bilateral shoulder ER with red band 2 x 10 Seated bilateral shoulder horizontal abduction with red band 2 x 10 During the visit, patient mentions her neck issues, her eyes need surgery, she has the GI issues  and then said she was going to see Dr. Agapito Games because "my legs are little"  She then says she has sleep apnea but doesn't use her machine any longer.  We took some time to emphasize the importance of being transparent with her physicians so that they have an accurate picture of her history to be able to treat her accordingly.   Completed recert visit  DATE: 05/26/23 UBE x 5 min (2.5/2.5) level 1 (PT present to discuss status) 3 way scapular stabilization with green loop x 5 each side 4 D ball rolls x 20 each direction both Ue's Bilateral shoulder extension and rows with red band x 20 each Bilateral shoulder ER and  horizontal abduction 2 x 10 each  Trigger Point Dry-Needling  Treatment instructions: Expect mild to moderate muscle soreness. S/S of pneumothorax if dry needled over a lung field, and to seek immediate medical attention should they occur. Patient verbalized understanding of these instructions and education. Patient Consent Given: Yes Education handout provided: Yes Muscles treated: bilateral upper traps, cervical multifidi Electrical stimulation performed: No Parameters: N/A Treatment response/outcome: Skilled palpation used to identify taut bands and trigger points.  Once identified, dry needling techniques used to treat these areas.  Twitch response ellicited along with palpable elongation of muscle.  Following treatment, patient reported significant decrease in tightness and pain.  She was able to side bend and rotate with increased ease.    DATE: 05/05/23 Nustep x 5 min level 2 (PT present to discuss status) Tband postural exercises with yellow tband: shoulder ext, rows, bilateral shoulder ER, bilateral shoulder horizontal abduction.   Trigger Point Dry-Needling  Treatment instructions: Expect mild to moderate muscle soreness. S/S of pneumothorax if dry needled over a lung field, and to seek immediate medical attention should they occur. Patient verbalized understanding of these instructions and education.  Patient Consent Given: Yes Education handout provided: Yes Muscles treated: bilateral upper traps, cervical multifidi, sub occipitals Electrical stimulation performed: No Parameters: N/A Treatment response/outcome: Skilled palpation used to identify taut bands and trigger points.  Once identified, dry needling techniques used to treat these areas.  Twitch response ellicited along with palpable elongation of muscle.  Following treatment, patient reported left side felt less tight and right side felt sore.  She had visible increase in cervical rotation to left.    DATE: 04/13/23 Initial  eval completed and initiated HEP   PATIENT EDUCATION:  Education details: Initiated HEP and discussed pain control using ice or heat along with medication Person educated: Patient Education method: Explanation, Demonstration, Verbal cues, and Handouts Education comprehension: verbalized understanding, returned demonstration, and verbal cues required  HOME EXERCISE PROGRAM: Access Code: [email or web address] URL: [email or web address] Date: 05/05/2023 Prepared by: Mikey Kirschner  Exercises - Seated Cervical Retraction  - 1 x daily - 7 x weekly - 1 sets - 10 reps - 2-3 sec hold - Seated Cervical Rotation AROM  - 1 x daily - 7 x weekly - 1 sets - 10 reps - 2-3 sec hold - Seated Assisted Cervical Rotation with Towel  - 1 x daily - 7 x weekly - 1 sets - 10 reps - 2-3 sec hold - Seated Cervical Sidebending AROM  - 1 x daily - 7 x weekly - 1 sets - 10 reps - 2-3 sec hold - Shoulder extension with resistance - Neutral  - 2 x daily - 7 x weekly - 2 sets - 10 reps - Standing Shoulder Row with Anchored Resistance  - 2 x daily - 7 x weekly - 2 sets - 10 reps - Shoulder External Rotation and Scapular Retraction with Resistance  - 2 x daily - 7 x weekly - 2 sets - 10 reps - Standing Shoulder Horizontal Abduction with Resistance  - 2 x daily - 7 x weekly - 2 sets - 10 reps  ASSESSMENT:  CLINICAL IMPRESSION: Shannan is progressing slowly as she has not made many appts.  This is only her 4th visit.  She has not been compliant with her HEP.  She mentions multiple health and orthopedic issues today.  She admits she has suffered from chronic pain for a while.  We are going to recertify for 8 more weeks.     She would benefit from skilled PT for C spine ROM, dry needling for soft tissue mobility if she is able to tolerate this and PROM to C spine.    OBJECTIVE IMPAIRMENTS: decreased ROM, decreased strength, hypomobility, increased fascial restrictions, increased muscle spasms, impaired flexibility, postural  dysfunction, and pain.   ACTIVITY LIMITATIONS: carrying, lifting, bending, sleeping, bed mobility, bathing, dressing, reach over head, and hygiene/grooming  PARTICIPATION LIMITATIONS: meal prep, cleaning, laundry, driving, shopping, community activity, yard work, and church  PERSONAL FACTORS: Age and Fitness are also affecting patient's functional outcome.   REHAB POTENTIAL: Good  CLINICAL DECISION MAKING: Stable/uncomplicated  EVALUATION COMPLEXITY: Low   GOALS: Goals reviewed with patient? Yes  SHORT TERM GOALS: Target date: 05/11/2023   Pain report to be no greater than 4/10  Baseline:  Goal status: INITIAL  2.  Patient will be independent with initial HEP  Baseline:  Goal status: INITIAL  LONG TERM  GOALS: Target date: 06/09/2023   Patient to report pain no greater than 2/10  Baseline:  Goal status: INITIAL  2.  Patient to be independent with advanced HEP  Baseline:  Goal status: INITIAL  3.  Patient to be able to sleep through the night  Baseline:  Goal status: INITIAL  4.  Patient to report 85% improvement in overall symptoms Baseline:  Goal status: INITIAL  5.  NDI to improve to 60% Baseline: 34% Goal status: INITIAL  6.  Patient to be able to reach overhead to do basic grooming and dressing with left UE without pain Baseline:  Goal status: INITIAL   PLAN:  PT FREQUENCY: 2x/week  PT DURATION: 8 weeks  PLANNED INTERVENTIONS: Therapeutic exercises, Therapeutic activity, Neuromuscular re-education, Patient/Family education, Self Care, Joint mobilization, Aquatic Therapy, Dry Needling, Electrical stimulation, Spinal mobilization, Cryotherapy, Moist heat, Taping, Traction, Ultrasound, Ionotophoresis 4mg /ml Dexamethasone, Manual therapy, and Re-evaluation  PLAN FOR NEXT SESSION: continue working toward neck ROM and scapular stability, progress postural strengthening, possibly UBE, DN if patient had good response and demonstrates less apprehension.      Victorino Dike B. Rickie Gutierres, PT 06/08/23 10:16 PM  [facility] [address] [place], [place] [address] Phone # [phone] Fax [phone]

## 2023-06-09 ENCOUNTER — Encounter: Payer: Self-pay | Admitting: Nurse Practitioner

## 2023-06-09 ENCOUNTER — Ambulatory Visit (INDEPENDENT_AMBULATORY_CARE_PROVIDER_SITE_OTHER): Payer: Medicare Other | Admitting: Nurse Practitioner

## 2023-06-09 VITALS — BP 112/60 | HR 82 | Ht 61.0 in | Wt 125.0 lb

## 2023-06-09 DIAGNOSIS — K589 Irritable bowel syndrome without diarrhea: Secondary | ICD-10-CM

## 2023-06-09 DIAGNOSIS — K648 Other hemorrhoids: Secondary | ICD-10-CM | POA: Diagnosis not present

## 2023-06-09 DIAGNOSIS — K6289 Other specified diseases of anus and rectum: Secondary | ICD-10-CM | POA: Diagnosis not present

## 2023-06-09 NOTE — Progress Notes (Signed)
Assessment and Plan   Primary GI: Kristen Aris, MD  Brief Narrative:  77 y.o. yo female whose past medical history includes but is not necessarily limited to rectal prolapse, IBS, hemorrhoid banding by Surgery, diverticulosis,   Acute constipation, resolved .   Etiology unclear. Bowel movements back to baseline it seems  IBS with intermittent crampy loose stool.  -Continue Levsin as needed  Rectal burning.   She has mild rectal prolapse on exam today.  No obvious fissure.  Not sure why the burning sensation but looking back at the notes I see that she has had rectal discomfort in the past.  -Treat empirically for internal hemorrhoids with Preparation H  Memory loss   History of Present Illness   Chief complaint:  bowel changes, rectal burning, diarrhea  IN has a history of IBS.  She tells me that she has a long history of intermittent crampy diarrhea but a few days ago she became very constipated for unclear reasons . She went 4 days without a BM. She massaged her abdomen and eventually had an explosive bowel movement yesterday. Today and yesterday she has had a lot of flatus. She has Levsin on hand which generally helps but she is having a little more gas and cramps than usual.  She is having some burning in her rectum.  She has not had any blood in her stool  Dewayne Hatch had a colonoscopy in July 2023 for persistent abdominal pain.  A small polyp was removed.  She has severe diverticular disease    Previous GI Endoscopies / Labs / Imaging   July 2023 colonoscopy for abdominal pain - Decreased sphincter tone found on digital rectal exam. - One 2 mm polyp in the cecum, removed with a cold snare. Resected and retrieved. - Severe diverticulosis in the sigmoid colon. There was narrowing of the colon in association with the diverticular opening. There was evidence of diverticular spasm. There was no evidence of diverticular bleeding.    Latest Ref Rng & Units 05/27/2022    3:07 PM  02/22/2022   12:05 PM 04/15/2020   10:59 AM  Hepatic Function  Total Protein 6.0 - 8.3 g/dL 7.4  6.9  6.8   Albumin 3.5 - 5.2 g/dL 4.3  4.2  4.2   AST 0 - 37 U/L 17  16  12    ALT 0 - 35 U/L 26  9  8    Alk Phosphatase 39 - 117 U/L 74  58  54   Total Bilirubin 0.2 - 1.2 mg/dL 0.4  0.4  0.4      Past Medical History:  Diagnosis Date   Allergy    seasonal   Anxiety    Arthritis    knees,all over   Basal cell carcinoma    Benign breast cyst in female    PATIENT HAS HISTORY OF BREAST CYSTS   Cataract    beginning bilateral   Diabetes mellitus    GERD (gastroesophageal reflux disease)    Headache    Herpes progenitalis    Hyperlipidemia    Hypertension    Kidney stone    Kidney stones    OSA on CPAP    Sleep apnea    cpap   Urinary, incontinence, stress female    Vertigo     Past Surgical History:  Procedure Laterality Date   ABDOMINAL HYSTERECTOMY  1986   leiomyomata   Basal cell excised     breast cystectomy     right  CHOLECYSTECTOMY  2003   COLONOSCOPY     CYSTOSCOPY/URETEROSCOPY/HOLMIUM LASER/STENT PLACEMENT Right 01/12/2022   Procedure: CYSTOSCOPY/RETROGRADE/ URETEROSCOPY/HOLMIUM LASER/STENT PLACEMENT;  Surgeon: Noel Christmas, MD;  Location: [facility];  Service: Urology;  Laterality: Right;   ear cystectomy     ESOPHAGOGASTRODUODENOSCOPY     KNEE ARTHROSCOPY     right   LITHOTRIPSY     RECTOVAGINAL FISTULA CLOSURE     WISDOM TOOTH EXTRACTION      Current Medications, Allergies, Family History and Social History were reviewed in [employer] record.     Current Outpatient Medications  Medication Sig Dispense Refill   ACCU-CHEK GUIDE test strip USE TO CHECK BLOOD SUGAR TWO TO THREE TIMES A WEEK AS DIRECTED     acetaminophen (TYLENOL) 500 MG tablet Take 1,000 mg by mouth every 6 (six) hours as needed (for pain.).     aspirin EC 81 MG tablet Take 81 mg by mouth every evening.      atorvastatin (LIPITOR) 20 MG tablet Take 20 mg by  mouth daily.      betamethasone valerate ointment (VALISONE) 0.1 % Use a pea sized amount topically BID for up to one week as needed. 30 g 0   buPROPion (WELLBUTRIN XL) 150 MG 24 hr tablet Take 150 mg by mouth daily.     Diaphragm Arc-Spring (CAYA) DPRH Place 1 Device vaginally as needed. 10 each 1   diclofenac Sodium (VOLTAREN) 1 % GEL Apply 2 g topically daily as needed (pain).     fluticasone (FLONASE) 50 MCG/ACT nasal spray Place 1 spray into both nostrils daily as needed for allergies.      gabapentin (NEURONTIN) 100 MG capsule Take 1 capsule (100 mg total) by mouth at bedtime. 30 capsule 3   hydrocortisone 1 % ointment Apply 1 application topically 3 (three) times daily. (Patient taking differently: Apply 1 application  topically 3 (three) times daily as needed (mosquito bite).) 30 g 0   hyoscyamine (LEVSIN SL) 0.125 MG SL tablet DISSOLVE 1 TABLET IN MOUTH TWICE DAILY AS NEEDED 60 tablet 0   ibuprofen (ADVIL) 400 MG tablet Take 1 tablet (400 mg total) by mouth every 6 (six) hours as needed for moderate pain or mild pain. 20 tablet 0   lidocaine (LIDODERM) 5 % Place 1 patch onto the skin daily. Remove & Discard patch within 12 hours or as directed by MD 30 patch 11   loratadine (CLARITIN) 10 MG tablet Take 10 mg by mouth daily.     losartan (COZAAR) 25 MG tablet Take 25 mg by mouth daily.     metFORMIN (GLUCOPHAGE-XR) 500 MG 24 hr tablet Take 500 mg by mouth daily.      metoprolol succinate (TOPROL XL) 50 MG 24 hr tablet Take 1 tablet (50 mg total) by mouth daily. Take with or immediately following a meal. 30 tablet 0   Multiple Vitamins-Minerals (ICAPS) CAPS Take 1 capsule by mouth 2 (two) times daily.      omeprazole (PRILOSEC) 20 MG capsule Take 1 capsule (20 mg total) by mouth 2 (two) times daily before a meal. 180 capsule 3   ondansetron (ZOFRAN-ODT) 4 MG disintegrating tablet Take 1 tablet (4 mg total) by mouth every 8 (eight) hours as needed for nausea or vomiting. 10 tablet 0    Polyethyl Glycol-Propyl Glycol (SYSTANE) 0.4-0.3 % SOLN Place 1 drop into both eyes 2 (two) times daily.     RESTASIS 0.05 % ophthalmic emulsion Place 1 drop into both eyes  every 12 (twelve) hours.     tamsulosin (FLOMAX) 0.4 MG CAPS capsule Take 1 capsule (0.4 mg total) by mouth daily. 7 capsule 0   topiramate (TOPAMAX) 25 MG tablet Take 1 tablet (25 mg total) by mouth 2 (two) times daily. For headache prevention 60 tablet 1   valACYclovir (VALTREX) 500 MG tablet Take one tablet twice daily for 3-5 days as needed with an outbreak, then daily as needed (Patient taking differently: Take 500 mg by mouth 2 (two) times daily as needed (outbreak).) 30 tablet 12   Vitamin D, Cholecalciferol, 400 units TABS Take 400 Units by mouth daily.      vitamin E 180 MG (400 UNITS) capsule Take 400 Units by mouth daily.     Current Facility-Administered Medications  Medication Dose Route Frequency Provider Last Rate Last Admin   betamethasone acetate-betamethasone sodium phosphate (CELESTONE) injection 3 mg  3 mg Intra-articular Once Felecia Shelling, DPM        Review of Systems: No chest pain. No shortness of breath. No urinary complaints.    Physical Exam  Wt Readings from Last 3 Encounters:  06/02/23 130 lb (59 kg)  05/19/23 128 lb (58.1 kg)  04/05/23 128 lb (58.1 kg)    BP 112/60   Pulse 82   Ht 5\' 1"  (1.549 m)   Wt 125 lb (56.7 kg)   BMI 23.62 kg/m  Constitutional:  Pleasant, generally well appearing female in no acute distress. Psychiatric: Normal mood and affect. Behavior is normal. EENT: Pupils normal.  Conjunctivae are normal. No scleral icterus. Neck supple.  Cardiovascular: Normal rate, regular rhythm.  Pulmonary/chest: Effort normal and breath sounds normal. No wheezing, rales or rhonchi. Abdominal: Soft, nondistended, nontender. Bowel sounds active throughout. There are no masses palpable. No hepatomegaly. Rectal : Mild rectal prolapse.  No obvious fissures or external  hemorrhoids Neurological: Alert and oriented to person place and time.  Skin: Skin is warm and dry. No rashes noted.  Willette Cluster, NP  06/09/2023, 1:40 PM

## 2023-06-09 NOTE — Patient Instructions (Addendum)
Try Preparation H hemorrhoid cream. Use applicator tip and apply inside rectum twice daily for 7 days.  Call office if rectal discomfort doesn't improve or if you are having frequent episodes of diarrhea.   _______________________________________________________  If your blood pressure at your visit was 140/90 or greater, please contact your primary care physician to follow up on this.  _______________________________________________________  If you are age 17 or older, your body mass index should be between 23-30. Your Body mass index is 23.62 kg/m. If this is out of the aforementioned range listed, please consider follow up with your Primary Care Provider.  If you are age 1 or younger, your body mass index should be between 19-25. Your Body mass index is 23.62 kg/m. If this is out of the aformentioned range listed, please consider follow up with your Primary Care Provider.   __________________________________________________________  The [facility] providers would like to encourage you to use [facility] to communicate with providers for non-urgent requests or questions.  Due to long hold times on the telephone, sending your provider a message by [facility] may be a faster and more efficient way to get a response.  Please allow 48 business hours for a response.  Please remember that this is for non-urgent requests.     Thank you for choosing me and [facility].

## 2023-06-10 ENCOUNTER — Encounter: Payer: Self-pay | Admitting: Nurse Practitioner

## 2023-06-14 ENCOUNTER — Ambulatory Visit: Payer: Medicare Other

## 2023-06-14 DIAGNOSIS — R293 Abnormal posture: Secondary | ICD-10-CM | POA: Diagnosis not present

## 2023-06-14 DIAGNOSIS — R262 Difficulty in walking, not elsewhere classified: Secondary | ICD-10-CM | POA: Diagnosis not present

## 2023-06-14 DIAGNOSIS — M6281 Muscle weakness (generalized): Secondary | ICD-10-CM

## 2023-06-14 DIAGNOSIS — R252 Cramp and spasm: Secondary | ICD-10-CM | POA: Diagnosis not present

## 2023-06-14 DIAGNOSIS — M542 Cervicalgia: Secondary | ICD-10-CM

## 2023-06-14 NOTE — Therapy (Signed)
OUTPATIENT PHYSICAL THERAPY CERVICAL RECERT NOTE   Patient Name: Kristen Mahoney MRN: [id number] DOB:1946-08-28, 77 y.o., female Today's Date: 06/14/2023  END OF SESSION:  PT End of Session - 06/14/23 1059     Visit Number 5    Date for PT Re-Evaluation 08/03/23    Authorization Type [employer] MEDICARE    Progress Note Due on Visit 10    PT Start Time 1100    PT Stop Time 1145    PT Time Calculation (min) 45 min    Activity Tolerance Patient tolerated treatment well             Past Medical History:  Diagnosis Date   Allergy    seasonal   Anxiety    Arthritis    knees,all over   Basal cell carcinoma    Benign breast cyst in female    PATIENT HAS HISTORY OF BREAST CYSTS   Cataract    beginning bilateral   Diabetes mellitus    GERD (gastroesophageal reflux disease)    Headache    Herpes progenitalis    Hyperlipidemia    Hypertension    Kidney stone    Kidney stones    OSA on CPAP    Sleep apnea    cpap   Urinary, incontinence, stress female    Vertigo    Past Surgical History:  Procedure Laterality Date   ABDOMINAL HYSTERECTOMY  1986   leiomyomata   Basal cell excised     breast cystectomy     right   CHOLECYSTECTOMY  2003   COLONOSCOPY     CYSTOSCOPY/URETEROSCOPY/HOLMIUM LASER/STENT PLACEMENT Right 01/12/2022   Procedure: CYSTOSCOPY/RETROGRADE/ URETEROSCOPY/HOLMIUM LASER/STENT PLACEMENT;  Surgeon: Noel Christmas, MD;  Location: [facility];  Service: Urology;  Laterality: Right;   ear cystectomy     ESOPHAGOGASTRODUODENOSCOPY     KNEE ARTHROSCOPY     right   LITHOTRIPSY     RECTOVAGINAL FISTULA CLOSURE     WISDOM TOOTH EXTRACTION     Patient Active Problem List   Diagnosis Date Noted   Degenerative disc disease, lumbar 09/09/2022   Thoracic back pain 05/28/2022   Central sleep apnea syndrome 09/07/2021   Poor compliance with continuous positive airway pressure treatment 09/07/2021   Episodic tension-type headache, not intractable 09/07/2021    Gastroesophageal reflux disease without esophagitis 04/03/2021   History of weight loss 04/03/2021   Poor compliance with CPAP treatment 04/03/2021   History of total vaginal hysterectomy (TVH) 03/11/2021   Acute renal failure superimposed on stage 3b chronic kidney disease (HCC) 11/06/2019   Osteoarthritis of right knee 09/21/2019   Rectal pain 08/23/2017   Rectal bleeding 08/23/2017   Incontinence of feces 07/14/2017   HTN (hypertension), malignant 07/14/2017   Anxiety and depression 07/14/2017   Weight loss, unintentional 07/14/2017   Gas pain 06/17/2017   Bloating 06/17/2017   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   OSA on CPAP 07/13/2016   Trichomonas vaginitis 08/12/2015   Type 2 diabetes mellitus without complications (HCC) 03/17/2015   Kidney stone    DIARRHEA, INFECTIOUS 09/04/2009   BLOOD IN STOOL-MELENA 09/04/2009   Abdominal pain, left lower quadrant 09/04/2009   BLOOD IN STOOL, OCCULT 09/04/2009   HYPERLIPIDEMIA 12/25/2008   Reactive depression 12/25/2008   GERD 12/25/2008   HIATAL HERNIA 12/25/2008   Diverticulosis of colon 12/25/2008   IRRITABLE BOWEL SYNDROME 12/25/2008   RECTOVAGINAL FISTULA 12/25/2008   MITRAL VALVE PROLAPSE, HX OF 12/25/2008   Hiatal hernia 12/25/2008    PCP: Jarrett Soho, PA-C  REFERRING PROVIDER: Rodolph Bong, MD  REFERRING DIAG: M54.2 (ICD-10-CM) - Neck pain  THERAPY DIAG:  Cervicalgia  Cramp and spasm  Muscle weakness (generalized)  Abnormal posture  Rationale for Evaluation and Treatment: Rehabilitation  ONSET DATE: 03/15/2023  SUBJECTIVE:                                                                                                                                                                                                         SUBJECTIVE STATEMENT: Patient reports "I am doing good today"  Pain level 6/10.      Hand dominance: Right  PERTINENT HISTORY:  Scoliosis  PAIN:  06/14/23: Are you having pain?   6/10  PRECAUTIONS: None  WEIGHT BEARING RESTRICTIONS: No  FALLS:  Has patient fallen in last 6 months? No  LIVING ENVIRONMENT: Lives with: lives alone Lives in: House/apartment  OCCUPATION: retired  PLOF: Independent, Independent with basic ADLs, Independent with household mobility without device, Independent with community mobility with device, Independent with homemaking with ambulation, Independent with gait, and Independent with transfers  PATIENT GOALS: She would like to get rid of the pain and be able to turn her head without pain.   NEXT MD VISIT: prn  OBJECTIVE:   DIAGNOSTIC FINDINGS:  Narrative & Impression  CLINICAL DATA:  Neck pain.  Head injury 1 month ago.   EXAM: CERVICAL SPINE - 2-3 VIEW   COMPARISON:  Cervical MRI 02/24/2006.   FINDINGS: The prevertebral soft tissues are normal. The alignment is anatomic through T1. There is no evidence of acute fracture or traumatic subluxation. The C1-2 articulation appears normal in the AP projection. There is multilevel spondylosis with disc space narrowing, endplate osteophytes and facet hypertrophy, greatest at C5-6 and C6-7. There are scattered facet degenerative changes. The spondylosis appears similar to previous MRI.   IMPRESSION: No evidence of acute cervical spine fracture, traumatic subluxation or static signs of instability. Multilevel spondylosis, similar to previous MR    PATIENT SURVEYS:  NDI 17 (34%) 06/08/23: 36%  COGNITION: Overall cognitive status: Within functional limits for tasks assessed  SENSATION: WFL  POSTURE: rounded shoulders, forward head, and thoracic and lumbar scoliosis  PALPATION: Tender over sub occiptal area.  Taut bands and trigger points bilateral upper traps, levator paraspinals and parascapular areas.   CERVICAL ROM:   Active ROM A/PROM (deg) eval A/PROM (deg) 06/08/23  Flexion WNL WNL  Extension WNL WNL  Right lateral flexion 30 35  Left lateral flexion 55 55   Right rotation 58 60  Left rotation 60 60   (Blank  rows = not tested)  UPPER EXTREMITY ROM:  WFL, left shoulder slightly more restricted than left.   UPPER EXTREMITY MMT:  Generally 4/5 with exception of left shoulder scaption with IR and ER which were 3+/5  CERVICAL SPECIAL TESTS:  Spurling's test: Negative and Distraction test: Negative   TODAY'S TREATMENT:                                                                                                                              DATE: 06/14/23 Red tband: bilateral shoulder extension and rows x 20 each Red tband: bilateral shoulder ER and horizontal abduction UBE x 6 min (3/3) 3 way scapular stabilization x 10 each shoulder 4 D ball rolls with light blue plyo ball x 20 each direction Wall push ups plus x 10 Back against wall: cervical retraction x 20,  scapular retraction x 20 Lat pull down 2 x 10 with 30 lbs Attempted "thread the needle in quadruped" but patient could not tolerate quadruped position Side lying open book x 10  Taught patient hip flexor stretch off edge of mat table due to hip flexor tightness noted in supine which would affect her scoliosis and neck issues.    DATE: 06/08/23 UBE x 6 min (3/3) level 1 (PT present to discuss status) (Bathroom break x approx 5-7 min due to stomach issues) Seated bilateral shoulder ER with red band 2 x 10 Seated bilateral shoulder horizontal abduction with red band 2 x 10 During the visit, patient mentions her neck issues, her eyes need surgery, she has the GI issues  and then said she was going to see Dr. Agapito Games because "my legs are little"  She then says she has sleep apnea but doesn't use her machine any longer.  We took some time to emphasize the importance of being transparent with her physicians so that they have an accurate picture of her history to be able to treat her accordingly.   Completed recert visit  DATE: 05/26/23 UBE x 5 min (2.5/2.5) level 1 (PT present to  discuss status) 3 way scapular stabilization with green loop x 5 each side 4 D ball rolls x 20 each direction both Ue's Bilateral shoulder extension and rows with red band x 20 each Bilateral shoulder ER and horizontal abduction 2 x 10 each  Trigger Point Dry-Needling  Treatment instructions: Expect mild to moderate muscle soreness. S/S of pneumothorax if dry needled over a lung field, and to seek immediate medical attention should they occur. Patient verbalized understanding of these instructions and education. Patient Consent Given: Yes Education handout provided: Yes Muscles treated: bilateral upper traps, cervical multifidi Electrical stimulation performed: No Parameters: N/A Treatment response/outcome: Skilled palpation used to identify taut bands and trigger points.  Once identified, dry needling techniques used to treat these areas.  Twitch response ellicited along with palpable elongation of muscle.  Following treatment, patient reported significant decrease in tightness and pain.  She was able to side  bend and rotate with increased ease.     DATE: 04/13/23 Initial eval completed and initiated HEP   PATIENT EDUCATION:  Education details: Initiated HEP and discussed pain control using ice or heat along with medication Person educated: Patient Education method: Explanation, Demonstration, Verbal cues, and Handouts Education comprehension: verbalized understanding, returned demonstration, and verbal cues required  HOME EXERCISE PROGRAM: Access Code: [email or web address] URL: [email or web address] Date: 05/05/2023 Prepared by: Mikey Kirschner  Exercises - Seated Cervical Retraction  - 1 x daily - 7 x weekly - 1 sets - 10 reps - 2-3 sec hold - Seated Cervical Rotation AROM  - 1 x daily - 7 x weekly - 1 sets - 10 reps - 2-3 sec hold - Seated Assisted Cervical Rotation with Towel  - 1 x daily - 7 x weekly - 1 sets - 10 reps - 2-3 sec hold - Seated Cervical Sidebending AROM  - 1 x  daily - 7 x weekly - 1 sets - 10 reps - 2-3 sec hold - Shoulder extension with resistance - Neutral  - 2 x daily - 7 x weekly - 2 sets - 10 reps - Standing Shoulder Row with Anchored Resistance  - 2 x daily - 7 x weekly - 2 sets - 10 reps - Shoulder External Rotation and Scapular Retraction with Resistance  - 2 x daily - 7 x weekly - 2 sets - 10 reps - Standing Shoulder Horizontal Abduction with Resistance  - 2 x daily - 7 x weekly - 2 sets - 10 reps  ASSESSMENT:  CLINICAL IMPRESSION: Zavia was doing very well today.  She was able to tolerate significantly more exercise with less fatigue.  She had no complaints of pain.  She would benefit from skilled PT for C spine ROM, dry needling for soft tissue mobility if she is able to tolerate this and PROM to C spine.    OBJECTIVE IMPAIRMENTS: decreased ROM, decreased strength, hypomobility, increased fascial restrictions, increased muscle spasms, impaired flexibility, postural dysfunction, and pain.   ACTIVITY LIMITATIONS: carrying, lifting, bending, sleeping, bed mobility, bathing, dressing, reach over head, and hygiene/grooming  PARTICIPATION LIMITATIONS: meal prep, cleaning, laundry, driving, shopping, community activity, yard work, and church  PERSONAL FACTORS: Age and Fitness are also affecting patient's functional outcome.   REHAB POTENTIAL: Good  CLINICAL DECISION MAKING: Stable/uncomplicated  EVALUATION COMPLEXITY: Low   GOALS: Goals reviewed with patient? Yes  SHORT TERM GOALS: Target date: 06/27/23   Pain report to be no greater than 4/10  Baseline:  Goal status: IN PROGRESS  2.  Patient will be independent with initial HEP  Baseline:  Goal status: MET  LONG TERM GOALS: Target date: 08/03/2023   Patient to report pain no greater than 2/10  Baseline:  Goal status: INITIAL  2.  Patient to be independent with advanced HEP  Baseline:  Goal status: INITIAL  3.  Patient to be able to sleep through the night  Baseline:   Goal status: INITIAL  4.  Patient to report 85% improvement in overall symptoms Baseline:  Goal status: INITIAL  5.  NDI to improve to 60% Baseline: 34% Goal status: INITIAL  6.  Patient to be able to reach overhead to do basic grooming and dressing with left UE without pain Baseline:  Goal status: INITIAL   PLAN:  PT FREQUENCY: 2x/week  PT DURATION: 8 weeks  PLANNED INTERVENTIONS: Therapeutic exercises, Therapeutic activity, Neuromuscular re-education, Patient/Family education, Self Care, Joint mobilization, Aquatic Therapy, Dry Needling, Electrical stimulation, Spinal mobilization, Cryotherapy,  Moist heat, Taping, Traction, Ultrasound, Ionotophoresis 4mg /ml Dexamethasone, Manual therapy, and Re-evaluation  PLAN FOR NEXT SESSION: continue working toward neck ROM and scapular stability, progress postural strengthening, possibly UBE, DN if patient had good response and demonstrates less apprehension.     Victorino Dike B. Jaden Abreu, PT 06/14/23 11:53 AM  [facility] [address], [address] [place], [place] [address] Phone # [phone] Fax [phone]

## 2023-06-21 ENCOUNTER — Encounter: Payer: Self-pay | Admitting: Physical Therapy

## 2023-06-21 ENCOUNTER — Ambulatory Visit: Payer: Medicare Other | Admitting: Physical Therapy

## 2023-06-21 ENCOUNTER — Ambulatory Visit (INDEPENDENT_AMBULATORY_CARE_PROVIDER_SITE_OTHER): Payer: Medicare Other | Admitting: Family Medicine

## 2023-06-21 VITALS — BP 110/64 | HR 98 | Ht 61.0 in | Wt 127.0 lb

## 2023-06-21 DIAGNOSIS — R519 Headache, unspecified: Secondary | ICD-10-CM

## 2023-06-21 DIAGNOSIS — S060X9D Concussion with loss of consciousness of unspecified duration, subsequent encounter: Secondary | ICD-10-CM | POA: Diagnosis not present

## 2023-06-21 DIAGNOSIS — M542 Cervicalgia: Secondary | ICD-10-CM | POA: Diagnosis not present

## 2023-06-21 DIAGNOSIS — R252 Cramp and spasm: Secondary | ICD-10-CM | POA: Diagnosis not present

## 2023-06-21 DIAGNOSIS — M6281 Muscle weakness (generalized): Secondary | ICD-10-CM | POA: Diagnosis not present

## 2023-06-21 DIAGNOSIS — R293 Abnormal posture: Secondary | ICD-10-CM | POA: Diagnosis not present

## 2023-06-21 DIAGNOSIS — G4733 Obstructive sleep apnea (adult) (pediatric): Secondary | ICD-10-CM | POA: Diagnosis not present

## 2023-06-21 DIAGNOSIS — R262 Difficulty in walking, not elsewhere classified: Secondary | ICD-10-CM | POA: Diagnosis not present

## 2023-06-21 MED ORDER — TOPIRAMATE 25 MG PO TABS: 25.0000 mg | ORAL_TABLET | Freq: Two times a day (BID) | ORAL | 2 refills | Status: DC

## 2023-06-21 NOTE — Patient Instructions (Addendum)
Thank you for coming in today.   Continue current treatment including Topamax.   I do recommend CPAP.   It may be worth recheck sleep study with weight loss to see if you need it.   Recheck with me in 3 months or sooner if needed.

## 2023-06-21 NOTE — Therapy (Signed)
OUTPATIENT PHYSICAL THERAPY CERVICAL TREATMENT NOTE   Patient Name: Kristen Mahoney MRN: [id number] DOB:03-05-1946, 77 y.o., female Today's Date: 06/21/2023  END OF SESSION:  PT End of Session - 06/21/23 1232     Visit Number 6    Date for PT Re-Evaluation 08/03/23    Authorization Type [employer] MEDICARE    Progress Note Due on Visit 10    PT Start Time 1232    PT Stop Time 1310    PT Time Calculation (min) 38 min    Activity Tolerance Patient tolerated treatment well    Behavior During Therapy WFL for tasks assessed/performed              Past Medical History:  Diagnosis Date   Allergy    seasonal   Anxiety    Arthritis    knees,all over   Basal cell carcinoma    Benign breast cyst in female    PATIENT HAS HISTORY OF BREAST CYSTS   Cataract    beginning bilateral   Diabetes mellitus    GERD (gastroesophageal reflux disease)    Headache    Herpes progenitalis    Hyperlipidemia    Hypertension    Kidney stone    Kidney stones    OSA on CPAP    Sleep apnea    cpap   Urinary, incontinence, stress female    Vertigo    Past Surgical History:  Procedure Laterality Date   ABDOMINAL HYSTERECTOMY  1986   leiomyomata   Basal cell excised     breast cystectomy     right   CHOLECYSTECTOMY  2003   COLONOSCOPY     CYSTOSCOPY/URETEROSCOPY/HOLMIUM LASER/STENT PLACEMENT Right 01/12/2022   Procedure: CYSTOSCOPY/RETROGRADE/ URETEROSCOPY/HOLMIUM LASER/STENT PLACEMENT;  Surgeon: Noel Christmas, MD;  Location: [facility];  Service: Urology;  Laterality: Right;   ear cystectomy     ESOPHAGOGASTRODUODENOSCOPY     KNEE ARTHROSCOPY     right   LITHOTRIPSY     RECTOVAGINAL FISTULA CLOSURE     WISDOM TOOTH EXTRACTION     Patient Active Problem List   Diagnosis Date Noted   Degenerative disc disease, lumbar 09/09/2022   Thoracic back pain 05/28/2022   Central sleep apnea syndrome 09/07/2021   Poor compliance with continuous positive airway pressure treatment 09/07/2021    Episodic tension-type headache, not intractable 09/07/2021   Gastroesophageal reflux disease without esophagitis 04/03/2021   History of weight loss 04/03/2021   Poor compliance with CPAP treatment 04/03/2021   History of total vaginal hysterectomy (TVH) 03/11/2021   Acute renal failure superimposed on stage 3b chronic kidney disease (HCC) 11/06/2019   Osteoarthritis of right knee 09/21/2019   Rectal pain 08/23/2017   Rectal bleeding 08/23/2017   Incontinence of feces 07/14/2017   HTN (hypertension), malignant 07/14/2017   Anxiety and depression 07/14/2017   Weight loss, unintentional 07/14/2017   Gas pain 06/17/2017   Bloating 06/17/2017   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   OSA on CPAP 07/13/2016   Trichomonas vaginitis 08/12/2015   Type 2 diabetes mellitus without complications (HCC) 03/17/2015   Kidney stone    DIARRHEA, INFECTIOUS 09/04/2009   BLOOD IN STOOL-MELENA 09/04/2009   Abdominal pain, left lower quadrant 09/04/2009   BLOOD IN STOOL, OCCULT 09/04/2009   HYPERLIPIDEMIA 12/25/2008   Reactive depression 12/25/2008   GERD 12/25/2008   HIATAL HERNIA 12/25/2008   Diverticulosis of colon 12/25/2008   IRRITABLE BOWEL SYNDROME 12/25/2008   RECTOVAGINAL FISTULA 12/25/2008   MITRAL VALVE PROLAPSE, HX OF 12/25/2008  Hiatal hernia 12/25/2008    PCP: Jarrett Soho, PA-C   REFERRING PROVIDER: Rodolph Bong, MD  REFERRING DIAG: M54.2 (ICD-10-CM) - Neck pain  THERAPY DIAG:  Cervicalgia  Cramp and spasm  Muscle weakness (generalized)  Rationale for Evaluation and Treatment: Rehabilitation  ONSET DATE: 03/15/2023  SUBJECTIVE:                                                                                                                                                                                                         SUBJECTIVE STATEMENT: I am so sleepy today.  Just came from seeing Dr. Denyse Amass.  He wants me to keep doing PT and he will see me in another  month. Pain level is about the same.    Hand dominance: Right  PERTINENT HISTORY:  Scoliosis  PAIN:  06/14/23: Are you having pain?  6/10  PRECAUTIONS: None  WEIGHT BEARING RESTRICTIONS: No  FALLS:  Has patient fallen in last 6 months? No  LIVING ENVIRONMENT: Lives with: lives alone Lives in: House/apartment  OCCUPATION: retired  PLOF: Independent, Independent with basic ADLs, Independent with household mobility without device, Independent with community mobility with device, Independent with homemaking with ambulation, Independent with gait, and Independent with transfers  PATIENT GOALS: She would like to get rid of the pain and be able to turn her head without pain.   NEXT MD VISIT: prn  OBJECTIVE:   DIAGNOSTIC FINDINGS:  Narrative & Impression  CLINICAL DATA:  Neck pain.  Head injury 1 month ago.   EXAM: CERVICAL SPINE - 2-3 VIEW   COMPARISON:  Cervical MRI 02/24/2006.   FINDINGS: The prevertebral soft tissues are normal. The alignment is anatomic through T1. There is no evidence of acute fracture or traumatic subluxation. The C1-2 articulation appears normal in the AP projection. There is multilevel spondylosis with disc space narrowing, endplate osteophytes and facet hypertrophy, greatest at C5-6 and C6-7. There are scattered facet degenerative changes. The spondylosis appears similar to previous MRI.   IMPRESSION: No evidence of acute cervical spine fracture, traumatic subluxation or static signs of instability. Multilevel spondylosis, similar to previous MR    PATIENT SURVEYS:  NDI 17 (34%) 06/08/23: 36%  COGNITION: Overall cognitive status: Within functional limits for tasks assessed  SENSATION: WFL  POSTURE: rounded shoulders, forward head, and thoracic and lumbar scoliosis  PALPATION: Tender over sub occiptal area.  Taut bands and trigger points bilateral upper traps, levator paraspinals and parascapular areas.   CERVICAL ROM:   Active  ROM A/PROM (deg) eval A/PROM (deg) 06/08/23  Flexion WNL  WNL  Extension WNL WNL  Right lateral flexion 30 35  Left lateral flexion 55 55  Right rotation 58 60  Left rotation 60 60   (Blank rows = not tested)  UPPER EXTREMITY ROM:  WFL, left shoulder slightly more restricted than left.   UPPER EXTREMITY MMT:  Generally 4/5 with exception of left shoulder scaption with IR and ER which were 3+/5  CERVICAL SPECIAL TESTS:  Spurling's test: Negative and Distraction test: Negative   TODAY'S TREATMENT:                                                                                                                              DATE:  06/21/23: UBE L1 2x2 fwd/bwd PT present to discuss status Red tband: bilateral shoulder extension and rows x 20 each Red tband: bilateral shoulder ER and horizontal abduction 2x10 in circuit Wall push ups x10 Seated lumbar flexion blue physioball rollouts x 10 and flexion/SB x 5 rounds Open books x 8 each way Discussion about how rotation exercise helps with constipation and use of abdominal massage and tapping along descending colon can help with gas and constipation  4 D ball rolls with light blue plyo ball x 20 each direction Back against wall: cervical retraction 3x10",  scapular retraction 3x10", combined 3x10" Seated neck rotation in retraction x 10 Lat pull down 2 x 10 with 30 lbs    06/14/23 Red tband: bilateral shoulder extension and rows x 20 each Red tband: bilateral shoulder ER and horizontal abduction UBE x 6 min (3/3) 3 way scapular stabilization x 10 each shoulder 4 D ball rolls with light blue plyo ball x 20 each direction Wall push ups plus x 10 Back against wall: cervical retraction x 20,  scapular retraction x 20 Lat pull down 2 x 10 with 30 lbs Attempted "thread the needle in quadruped" but patient could not tolerate quadruped position Side lying open book x 10  Taught patient hip flexor stretch off edge of mat table due to hip  flexor tightness noted in supine which would affect her scoliosis and neck issues.    DATE: 06/08/23 UBE x 6 min (3/3) level 1 (PT present to discuss status) (Bathroom break x approx 5-7 min due to stomach issues) Seated bilateral shoulder ER with red band 2 x 10 Seated bilateral shoulder horizontal abduction with red band 2 x 10 During the visit, patient mentions her neck issues, her eyes need surgery, she has the GI issues  and then said she was going to see Dr. Agapito Games because "my legs are little"  She then says she has sleep apnea but doesn't use her machine any longer.  We took some time to emphasize the importance of being transparent with her physicians so that they have an accurate picture of her history to be able to treat her accordingly.   Completed recert visit   PATIENT EDUCATION:  Education details: Initiated HEP and discussed pain control using  ice or heat along with medication Person educated: Patient Education method: Explanation, Demonstration, Verbal cues, and Handouts Education comprehension: verbalized understanding, returned demonstration, and verbal cues required  HOME EXERCISE PROGRAM: Access Code: [email or web address] URL: [email or web address] Date: 05/05/2023 Prepared by: Mikey Kirschner  Exercises - Seated Cervical Retraction  - 1 x daily - 7 x weekly - 1 sets - 10 reps - 2-3 sec hold - Seated Cervical Rotation AROM  - 1 x daily - 7 x weekly - 1 sets - 10 reps - 2-3 sec hold - Seated Assisted Cervical Rotation with Towel  - 1 x daily - 7 x weekly - 1 sets - 10 reps - 2-3 sec hold - Seated Cervical Sidebending AROM  - 1 x daily - 7 x weekly - 1 sets - 10 reps - 2-3 sec hold - Shoulder extension with resistance - Neutral  - 2 x daily - 7 x weekly - 2 sets - 10 reps - Standing Shoulder Row with Anchored Resistance  - 2 x daily - 7 x weekly - 2 sets - 10 reps - Shoulder External Rotation and Scapular Retraction with Resistance  - 2 x daily - 7 x weekly - 2 sets -  10 reps - Standing Shoulder Horizontal Abduction with Resistance  - 2 x daily - 7 x weekly - 2 sets - 10 reps  ASSESSMENT:  CLINICAL IMPRESSION: Marene arrived sleepy and with some LBP today.  She was re-energized within her session and did very well with therex with intermittent need for cueing to engage scapulae more with her tband therex.  She inquired about constipation relief techniques and stretches for lumbar spine so incorporated into session.    OBJECTIVE IMPAIRMENTS: decreased ROM, decreased strength, hypomobility, increased fascial restrictions, increased muscle spasms, impaired flexibility, postural dysfunction, and pain.   ACTIVITY LIMITATIONS: carrying, lifting, bending, sleeping, bed mobility, bathing, dressing, reach over head, and hygiene/grooming  PARTICIPATION LIMITATIONS: meal prep, cleaning, laundry, driving, shopping, community activity, yard work, and church  PERSONAL FACTORS: Age and Fitness are also affecting patient's functional outcome.   REHAB POTENTIAL: Good  CLINICAL DECISION MAKING: Stable/uncomplicated  EVALUATION COMPLEXITY: Low   GOALS: Goals reviewed with patient? Yes  SHORT TERM GOALS: Target date: 06/27/23   Pain report to be no greater than 4/10  Baseline:  Goal status: IN PROGRESS  2.  Patient will be independent with initial HEP  Baseline:  Goal status: MET  LONG TERM GOALS: Target date: 08/03/2023   Patient to report pain no greater than 2/10  Baseline:  Goal status: INITIAL  2.  Patient to be independent with advanced HEP  Baseline:  Goal status: INITIAL  3.  Patient to be able to sleep through the night  Baseline:  Goal status: INITIAL  4.  Patient to report 85% improvement in overall symptoms Baseline:  Goal status: INITIAL  5.  NDI to improve to 60% Baseline: 34% Goal status: INITIAL  6.  Patient to be able to reach overhead to do basic grooming and dressing with left UE without pain Baseline:  Goal status:  INITIAL   PLAN:  PT FREQUENCY: 2x/week  PT DURATION: 8 weeks  PLANNED INTERVENTIONS: Therapeutic exercises, Therapeutic activity, Neuromuscular re-education, Patient/Family education, Self Care, Joint mobilization, Aquatic Therapy, Dry Needling, Electrical stimulation, Spinal mobilization, Cryotherapy, Moist heat, Taping, Traction, Ultrasound, Ionotophoresis 4mg /ml Dexamethasone, Manual therapy, and Re-evaluation  PLAN FOR NEXT SESSION: continue working toward neck ROM and scapular stability, progress postural strengthening, possibly UBE, Pt requests no more DN  Caisley Baxendale, PT 06/21/23 1:21 PM  [facility] [address] [place], [place] [address] Phone # [phone] Fax [phone]

## 2023-06-21 NOTE — Progress Notes (Signed)
   Rubin Payor, PhD, LAT, ATC acting as a scribe for [name], MD.  Kristen Mahoney is a 77 y.o. female who presents to [facility] at [facility] today for  f/u concussion. Sometime in mid-February, she was hit in the head by the lid of a trash can when putting bag of trash in it. Pt was last seen by Dr. Denyse Amass on 05/19/23 and she was prescribed Topamax and advised to cont PT, completing 5 visits.   Today, pt reports "crampy" feeling in her back and flanks. HA remain, slightly less frequent. She declined dry needling at PT.   Overall she feels like she is doing okay.  Her symptoms are improving.  She notes that she is not using her CPAP machine.  She is lost quite a bit of weight since it was started and does not think it is necessary any longer.  She cannot tell any difference after stopping the machine.  She has an appointment with her neurologist scheduled for August 22.  Her neurologist is running the CPAP machine.  Dx imaging: 04/20/23 Brain MRI             03/15/23 C-spine XR  Pertinent review of systems: No fevers or chills  Relevant historical information: Sleep apnea   Exam:  BP 110/64   Pulse 98   Ht 5\' 1"  (1.549 m)   Wt 127 lb (57.6 kg)   SpO2 98%   BMI 24.00 kg/m  General: Well Developed, well nourished, and in no acute distress.   Neuropsych: Alert and oriented normal coordination and gait.      Assessment and Plan: 77 y.o. female with headache secondary to concussion.  Much better managed now with Topamax.  She takes immediate release Topamax 25 mg twice daily and headache is better controlled.  Plan to continue Topamax.  She has a few MSK issues that are currently being managed by [facility].  Happy to help if needed but it seems like that is well-controlled.  As for her CPAP machine generally I think is a good idea.  Her AHI on a home sleep study in 2022 was over 30.  This would indicate pretty severe sleep apnea and necessitate CPAP.   She is lost weight since then and cannot tell any difference off of the machine.  I recommended that she continue to use CPAP and follow-up with her neurologist on August 22.  She may benefit from a repeat sleep study to see if she still needs it or not.  I expect she probably does not have not well-controlled sleep apnea and probably should be on the CPAP machine.  Recheck in 3 months. PDMP not reviewed this encounter. No orders of the defined types were placed in this encounter.  Meds ordered this encounter  Medications   topiramate (TOPAMAX) 25 MG tablet    Sig: Take 1 tablet (25 mg total) by mouth 2 (two) times daily. For headache prevention    Dispense:  180 tablet    Refill:  2     Discussed warning signs or symptoms. Please see discharge instructions. Patient expresses understanding.   The above documentation has been reviewed and is accurate and complete [name], M.D.

## 2023-06-27 ENCOUNTER — Ambulatory Visit: Payer: Medicare Other | Admitting: Physical Therapy

## 2023-06-28 DIAGNOSIS — M5416 Radiculopathy, lumbar region: Secondary | ICD-10-CM | POA: Diagnosis not present

## 2023-07-04 ENCOUNTER — Ambulatory Visit: Payer: Medicare Other | Admitting: Physical Therapy

## 2023-07-19 ENCOUNTER — Ambulatory Visit: Payer: Medicare Other | Attending: Family Medicine

## 2023-07-19 DIAGNOSIS — M6281 Muscle weakness (generalized): Secondary | ICD-10-CM | POA: Insufficient documentation

## 2023-07-19 DIAGNOSIS — R262 Difficulty in walking, not elsewhere classified: Secondary | ICD-10-CM | POA: Insufficient documentation

## 2023-07-19 DIAGNOSIS — M542 Cervicalgia: Secondary | ICD-10-CM | POA: Diagnosis not present

## 2023-07-19 DIAGNOSIS — R293 Abnormal posture: Secondary | ICD-10-CM | POA: Diagnosis not present

## 2023-07-19 DIAGNOSIS — R252 Cramp and spasm: Secondary | ICD-10-CM | POA: Insufficient documentation

## 2023-07-19 NOTE — Therapy (Signed)
OUTPATIENT PHYSICAL THERAPY CERVICAL TREATMENT NOTE   Patient Name: Kristen Mahoney MRN: [id number] DOB:[birth year], 77 y.o., female Today's Date: 07/19/2023  END OF SESSION:  PT End of Session - 07/19/23 1104     Visit Number 7    Date for PT Re-Evaluation 08/03/23    Authorization Type [employer] MEDICARE    Progress Note Due on Visit 10    PT Start Time 1104    PT Stop Time 1147    PT Time Calculation (min) 43 min    Activity Tolerance Patient tolerated treatment well    Behavior During Therapy WFL for tasks assessed/performed              Past Medical History:  Diagnosis Date   Allergy    seasonal   Anxiety    Arthritis    knees,all over   Basal cell carcinoma    Benign breast cyst in female    PATIENT HAS HISTORY OF BREAST CYSTS   Cataract    beginning bilateral   Diabetes mellitus    GERD (gastroesophageal reflux disease)    Headache    Herpes progenitalis    Hyperlipidemia    Hypertension    Kidney stone    Kidney stones    OSA on CPAP    Sleep apnea    cpap   Urinary, incontinence, stress female    Vertigo    Past Surgical History:  Procedure Laterality Date   ABDOMINAL HYSTERECTOMY  1986   leiomyomata   Basal cell excised     breast cystectomy     right   CHOLECYSTECTOMY  2003   COLONOSCOPY     CYSTOSCOPY/URETEROSCOPY/HOLMIUM LASER/STENT PLACEMENT Right 01/12/2022   Procedure: CYSTOSCOPY/RETROGRADE/ URETEROSCOPY/HOLMIUM LASER/STENT PLACEMENT;  Surgeon: Noel Christmas, MD;  Location: [facility];  Service: Urology;  Laterality: Right;   ear cystectomy     ESOPHAGOGASTRODUODENOSCOPY     KNEE ARTHROSCOPY     right   LITHOTRIPSY     RECTOVAGINAL FISTULA CLOSURE     WISDOM TOOTH EXTRACTION     Patient Active Problem List   Diagnosis Date Noted   Degenerative disc disease, lumbar 09/09/2022   Thoracic back pain 05/28/2022   Central sleep apnea syndrome 09/07/2021   Poor compliance with continuous positive airway pressure treatment 09/07/2021    Episodic tension-type headache, not intractable 09/07/2021   Gastroesophageal reflux disease without esophagitis 04/03/2021   History of weight loss 04/03/2021   Poor compliance with CPAP treatment 04/03/2021   History of total vaginal hysterectomy (TVH) 03/11/2021   Acute renal failure superimposed on stage 3b chronic kidney disease (HCC) 11/06/2019   Osteoarthritis of right knee 09/21/2019   Rectal pain 08/23/2017   Rectal bleeding 08/23/2017   Incontinence of feces 07/14/2017   HTN (hypertension), malignant 07/14/2017   Anxiety and depression 07/14/2017   Weight loss, unintentional 07/14/2017   Gas pain 06/17/2017   Bloating 06/17/2017   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   OSA on CPAP 07/13/2016   Trichomonas vaginitis 08/12/2015   Type 2 diabetes mellitus without complications (HCC) 03/17/2015   Kidney stone    DIARRHEA, INFECTIOUS 09/04/2009   BLOOD IN STOOL-MELENA 09/04/2009   Abdominal pain, left lower quadrant 09/04/2009   BLOOD IN STOOL, OCCULT 09/04/2009   HYPERLIPIDEMIA 12/25/2008   Reactive depression 12/25/2008   GERD 12/25/2008   HIATAL HERNIA 12/25/2008   Diverticulosis of colon 12/25/2008   IRRITABLE BOWEL SYNDROME 12/25/2008   RECTOVAGINAL FISTULA 12/25/2008   MITRAL VALVE PROLAPSE, HX OF 12/25/2008  Hiatal hernia 12/25/2008    PCP: Jarrett Soho, PA-C   REFERRING PROVIDER: Rodolph Bong, MD  REFERRING DIAG: M54.2 (ICD-10-CM) - Neck pain  THERAPY DIAG:  Cervicalgia  Cramp and spasm  Muscle weakness (generalized)  Abnormal posture  Difficulty in walking, not elsewhere classified  Rationale for Evaluation and Treatment: Rehabilitation  ONSET DATE: 03/15/2023  SUBJECTIVE:                                                                                                                                                                                                         SUBJECTIVE STATEMENT: Patient returns after having been away from PT for  almost one month.  She had a funeral out of town and then was sick.  She states her neck is "really stiff" on both sides.    Hand dominance: Right  PERTINENT HISTORY:  Scoliosis  PAIN:  07/19/23: Are you having pain?  5/10  PRECAUTIONS: None  WEIGHT BEARING RESTRICTIONS: No  FALLS:  Has patient fallen in last 6 months? No  LIVING ENVIRONMENT: Lives with: lives alone Lives in: House/apartment  OCCUPATION: retired  PLOF: Independent, Independent with basic ADLs, Independent with household mobility without device, Independent with community mobility with device, Independent with homemaking with ambulation, Independent with gait, and Independent with transfers  PATIENT GOALS: She would like to get rid of the pain and be able to turn her head without pain.   NEXT MD VISIT: prn  OBJECTIVE:   DIAGNOSTIC FINDINGS:  Narrative & Impression  CLINICAL DATA:  Neck pain.  Head injury 1 month ago.   EXAM: CERVICAL SPINE - 2-3 VIEW   COMPARISON:  Cervical MRI 02/24/2006.   FINDINGS: The prevertebral soft tissues are normal. The alignment is anatomic through T1. There is no evidence of acute fracture or traumatic subluxation. The C1-2 articulation appears normal in the AP projection. There is multilevel spondylosis with disc space narrowing, endplate osteophytes and facet hypertrophy, greatest at C5-6 and C6-7. There are scattered facet degenerative changes. The spondylosis appears similar to previous MRI.   IMPRESSION: No evidence of acute cervical spine fracture, traumatic subluxation or static signs of instability. Multilevel spondylosis, similar to previous MR    PATIENT SURVEYS:  NDI 17 (34%) 06/08/23: 36%  COGNITION: Overall cognitive status: Within functional limits for tasks assessed  SENSATION: WFL  POSTURE: rounded shoulders, forward head, and thoracic and lumbar scoliosis  PALPATION: Tender over sub occiptal area.  Taut bands and trigger points bilateral  upper traps, levator paraspinals and parascapular areas.   CERVICAL ROM:  Active ROM A/PROM (deg) eval A/PROM (deg) 06/08/23  Flexion WNL WNL  Extension WNL WNL  Right lateral flexion 30 35  Left lateral flexion 55 55  Right rotation 58 60  Left rotation 60 60   (Blank rows = not tested)  UPPER EXTREMITY ROM:  WFL, left shoulder slightly more restricted than left.   UPPER EXTREMITY MMT:  Generally 4/5 with exception of left shoulder scaption with IR and ER which were 3+/5  CERVICAL SPECIAL TESTS:  Spurling's test: Negative and Distraction test: Negative   TODAY'S TREATMENT:                                                                                                                              DATE:  07/19/23: Nustep x 5 min level 3 Discussion about progress and how we may proceed based on progress.  Discussed importance of attendance to PT and compliance with HEP.   Had patient attempt to demonstrate HEP but she could remember very little and technique was incorrect.   Reviewed entire HEP.   Lengthy education and ideas for home exercise and plans to begin water aerobics or exercise classes.    06/21/23: UBE L1 2x2 fwd/bwd PT present to discuss status Red tband: bilateral shoulder extension and rows x 20 each Red tband: bilateral shoulder ER and horizontal abduction 2x10 in circuit Wall push ups x10 Seated lumbar flexion blue physioball rollouts x 10 and flexion/SB x 5 rounds Open books x 8 each way Discussion about how rotation exercise helps with constipation and use of abdominal massage and tapping along descending colon can help with gas and constipation  4 D ball rolls with light blue plyo ball x 20 each direction Back against wall: cervical retraction 3x10",  scapular retraction 3x10", combined 3x10" Seated neck rotation in retraction x 10 Lat pull down 2 x 10 with 30 lbs    06/14/23 Red tband: bilateral shoulder extension and rows x 20 each Red tband:  bilateral shoulder ER and horizontal abduction UBE x 6 min (3/3) 3 way scapular stabilization x 10 each shoulder 4 D ball rolls with light blue plyo ball x 20 each direction Wall push ups plus x 10 Back against wall: cervical retraction x 20,  scapular retraction x 20 Lat pull down 2 x 10 with 30 lbs Attempted "thread the needle in quadruped" but patient could not tolerate quadruped position Side lying open book x 10  Taught patient hip flexor stretch off edge of mat table due to hip flexor tightness noted in supine which would affect her scoliosis and neck issues.    DATE: 06/08/23 UBE x 6 min (3/3) level 1 (PT present to discuss status) (Bathroom break x approx 5-7 min due to stomach issues) Seated bilateral shoulder ER with red band 2 x 10 Seated bilateral shoulder horizontal abduction with red band 2 x 10 During the visit, patient mentions her neck issues, her eyes need surgery, she has the  GI issues  and then said she was going to see Dr. Agapito Games because "my legs are little"  She then says she has sleep apnea but doesn't use her machine any longer.  We took some time to emphasize the importance of being transparent with her physicians so that they have an accurate picture of her history to be able to treat her accordingly.   Completed recert visit   PATIENT EDUCATION:  Education details: Initiated HEP and discussed pain control using ice or heat along with medication Person educated: Patient Education method: Explanation, Demonstration, Verbal cues, and Handouts Education comprehension: verbalized understanding, returned demonstration, and verbal cues required  HOME EXERCISE PROGRAM: Access Code: [email or web address] URL: [email or web address] Date: 05/05/2023 Prepared by: Mikey Kirschner  Exercises - Seated Cervical Retraction  - 1 x daily - 7 x weekly - 1 sets - 10 reps - 2-3 sec hold - Seated Cervical Rotation AROM  - 1 x daily - 7 x weekly - 1 sets - 10 reps - 2-3 sec  hold - Seated Assisted Cervical Rotation with Towel  - 1 x daily - 7 x weekly - 1 sets - 10 reps - 2-3 sec hold - Seated Cervical Sidebending AROM  - 1 x daily - 7 x weekly - 1 sets - 10 reps - 2-3 sec hold - Shoulder extension with resistance - Neutral  - 2 x daily - 7 x weekly - 2 sets - 10 reps - Standing Shoulder Row with Anchored Resistance  - 2 x daily - 7 x weekly - 2 sets - 10 reps - Shoulder External Rotation and Scapular Retraction with Resistance  - 2 x daily - 7 x weekly - 2 sets - 10 reps - Standing Shoulder Horizontal Abduction with Resistance  - 2 x daily - 7 x weekly - 2 sets - 10 reps  ASSESSMENT:  CLINICAL IMPRESSION: Laporsha has been away from PT for almost one month.  She states she had a funeral out of town and then was sick for a little while.  She continues to have significant stiffness in the cervical spine along with scoliosis and significant trigger points and tight bands throughout the neck and upper back.  She has a difficult time tolerating DN.  Compliance with HEP is fair.  We spent a lengthy amount of time discussing importance of consistency with her HEP especially when she is away from formal PT to avoid return of symptoms.  She would benefit from continued skilled PT for C spine ROM, postural strength and pain control.    OBJECTIVE IMPAIRMENTS: decreased ROM, decreased strength, hypomobility, increased fascial restrictions, increased muscle spasms, impaired flexibility, postural dysfunction, and pain.   ACTIVITY LIMITATIONS: carrying, lifting, bending, sleeping, bed mobility, bathing, dressing, reach over head, and hygiene/grooming  PARTICIPATION LIMITATIONS: meal prep, cleaning, laundry, driving, shopping, community activity, yard work, and church  PERSONAL FACTORS: Age and Fitness are also affecting patient's functional outcome.   REHAB POTENTIAL: Good  CLINICAL DECISION MAKING: Stable/uncomplicated  EVALUATION COMPLEXITY: Low   GOALS: Goals reviewed with  patient? Yes  SHORT TERM GOALS: Target date: 06/27/23   Pain report to be no greater than 4/10  Baseline:  Goal status: IN PROGRESS  2.  Patient will be independent with initial HEP  Baseline:  Goal status: MET  LONG TERM GOALS: Target date: 08/03/2023   Patient to report pain no greater than 2/10  Baseline:  Goal status: IN PROGRESS  2.  Patient to be independent with advanced HEP  Baseline:  Goal status: IN PROGRESS  3.  Patient to be able to sleep through the night  Baseline:  Goal status: INITIAL  4.  Patient to report 85% improvement in overall symptoms Baseline:  Goal status: INITIAL  5.  NDI to improve to 60% Baseline: 34% Goal status: INITIAL  6.  Patient to be able to reach overhead to do basic grooming and dressing with left UE without pain Baseline:  Goal status: INITIAL   PLAN:  PT FREQUENCY: 2x/week  PT DURATION: 8 weeks  PLANNED INTERVENTIONS: Therapeutic exercises, Therapeutic activity, Neuromuscular re-education, Patient/Family education, Self Care, Joint mobilization, Aquatic Therapy, Dry Needling, Electrical stimulation, Spinal mobilization, Cryotherapy, Moist heat, Taping, Traction, Ultrasound, Ionotophoresis 4mg /ml Dexamethasone, Manual therapy, and Re-evaluation  PLAN FOR NEXT SESSION: Neck ROM and scapular stability, progress postural strengthening, possibly UBE, Pt requests no more DN     Donnalynn Wheeless B. Beryl Balz, PT 07/19/23 10:35 PM  [facility] [address] [place], [place] [address] Phone # [phone] Fax [phone]

## 2023-07-25 ENCOUNTER — Encounter: Payer: Self-pay | Admitting: Physical Therapy

## 2023-07-25 ENCOUNTER — Ambulatory Visit: Payer: Medicare Other | Admitting: Physical Therapy

## 2023-07-25 DIAGNOSIS — M542 Cervicalgia: Secondary | ICD-10-CM | POA: Diagnosis not present

## 2023-07-25 DIAGNOSIS — R293 Abnormal posture: Secondary | ICD-10-CM

## 2023-07-25 DIAGNOSIS — R252 Cramp and spasm: Secondary | ICD-10-CM | POA: Diagnosis not present

## 2023-07-25 DIAGNOSIS — R262 Difficulty in walking, not elsewhere classified: Secondary | ICD-10-CM | POA: Diagnosis not present

## 2023-07-25 DIAGNOSIS — M6281 Muscle weakness (generalized): Secondary | ICD-10-CM | POA: Diagnosis not present

## 2023-07-25 NOTE — Therapy (Signed)
OUTPATIENT PHYSICAL THERAPY CERVICAL TREATMENT NOTE   Patient Name: Kristen Mahoney MRN: [id number] DOB:1946-12-24, 77 y.o., female Today's Date: 07/25/2023  END OF SESSION:  PT End of Session - 07/25/23 1129     Visit Number 8    Date for PT Re-Evaluation 08/03/23    Authorization Type [employer] MEDICARE    Progress Note Due on Visit 10    PT Start Time 1102    PT Stop Time 1140    PT Time Calculation (min) 38 min    Activity Tolerance Patient tolerated treatment well;No increased pain    Behavior During Therapy WFL for tasks assessed/performed               Past Medical History:  Diagnosis Date   Allergy    seasonal   Anxiety    Arthritis    knees,all over   Basal cell carcinoma    Benign breast cyst in female    PATIENT HAS HISTORY OF BREAST CYSTS   Cataract    beginning bilateral   Diabetes mellitus    GERD (gastroesophageal reflux disease)    Headache    Herpes progenitalis    Hyperlipidemia    Hypertension    Kidney stone    Kidney stones    OSA on CPAP    Sleep apnea    cpap   Urinary, incontinence, stress female    Vertigo    Past Surgical History:  Procedure Laterality Date   ABDOMINAL HYSTERECTOMY  1986   leiomyomata   Basal cell excised     breast cystectomy     right   CHOLECYSTECTOMY  2003   COLONOSCOPY     CYSTOSCOPY/URETEROSCOPY/HOLMIUM LASER/STENT PLACEMENT Right 01/12/2022   Procedure: CYSTOSCOPY/RETROGRADE/ URETEROSCOPY/HOLMIUM LASER/STENT PLACEMENT;  Surgeon: Noel Christmas, MD;  Location: [facility];  Service: Urology;  Laterality: Right;   ear cystectomy     ESOPHAGOGASTRODUODENOSCOPY     KNEE ARTHROSCOPY     right   LITHOTRIPSY     RECTOVAGINAL FISTULA CLOSURE     WISDOM TOOTH EXTRACTION     Patient Active Problem List   Diagnosis Date Noted   Degenerative disc disease, lumbar 09/09/2022   Thoracic back pain 05/28/2022   Central sleep apnea syndrome 09/07/2021   Poor compliance with continuous positive airway pressure treatment  09/07/2021   Episodic tension-type headache, not intractable 09/07/2021   Gastroesophageal reflux disease without esophagitis 04/03/2021   History of weight loss 04/03/2021   Poor compliance with CPAP treatment 04/03/2021   History of total vaginal hysterectomy (TVH) 03/11/2021   Acute renal failure superimposed on stage 3b chronic kidney disease (HCC) 11/06/2019   Osteoarthritis of right knee 09/21/2019   Rectal pain 08/23/2017   Rectal bleeding 08/23/2017   Incontinence of feces 07/14/2017   HTN (hypertension), malignant 07/14/2017   Anxiety and depression 07/14/2017   Weight loss, unintentional 07/14/2017   Gas pain 06/17/2017   Bloating 06/17/2017   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   OSA on CPAP 07/13/2016   Trichomonas vaginitis 08/12/2015   Type 2 diabetes mellitus without complications (HCC) 03/17/2015   Kidney stone    DIARRHEA, INFECTIOUS 09/04/2009   BLOOD IN STOOL-MELENA 09/04/2009   Abdominal pain, left lower quadrant 09/04/2009   BLOOD IN STOOL, OCCULT 09/04/2009   HYPERLIPIDEMIA 12/25/2008   Reactive depression 12/25/2008   GERD 12/25/2008   HIATAL HERNIA 12/25/2008   Diverticulosis of colon 12/25/2008   IRRITABLE BOWEL SYNDROME 12/25/2008   RECTOVAGINAL FISTULA 12/25/2008   MITRAL VALVE PROLAPSE, HX  OF 12/25/2008   Hiatal hernia 12/25/2008    PCP: Jarrett Soho, PA-C   REFERRING PROVIDER: Rodolph Bong, MD  REFERRING DIAG: M54.2 (ICD-10-CM) - Neck pain  THERAPY DIAG:  Cervicalgia  Cramp and spasm  Muscle weakness (generalized)  Abnormal posture  Difficulty in walking, not elsewhere classified  Rationale for Evaluation and Treatment: Rehabilitation  ONSET DATE: 03/15/2023  SUBJECTIVE:                                                                                                                                                                                                         SUBJECTIVE STATEMENT: Patient had a fall 3 days ago while out  of town. She is sore. Not able to do much of her HEP.  Hand dominance: Right  PERTINENT HISTORY:  Scoliosis  PAIN:  07/19/23: Are you having pain?  6/10 Bilateral neck, sub-occipital region  PRECAUTIONS: None  WEIGHT BEARING RESTRICTIONS: No  FALLS:  Has patient fallen in last 6 months? No  LIVING ENVIRONMENT: Lives with: lives alone Lives in: House/apartment  OCCUPATION: retired  PLOF: Independent, Independent with basic ADLs, Independent with household mobility without device, Independent with community mobility with device, Independent with homemaking with ambulation, Independent with gait, and Independent with transfers  PATIENT GOALS: She would like to get rid of the pain and be able to turn her head without pain.   NEXT MD VISIT: prn  OBJECTIVE:   DIAGNOSTIC FINDINGS:  Narrative & Impression  CLINICAL DATA:  Neck pain.  Head injury 1 month ago.   EXAM: CERVICAL SPINE - 2-3 VIEW   COMPARISON:  Cervical MRI 02/24/2006.   FINDINGS: The prevertebral soft tissues are normal. The alignment is anatomic through T1. There is no evidence of acute fracture or traumatic subluxation. The C1-2 articulation appears normal in the AP projection. There is multilevel spondylosis with disc space narrowing, endplate osteophytes and facet hypertrophy, greatest at C5-6 and C6-7. There are scattered facet degenerative changes. The spondylosis appears similar to previous MRI.   IMPRESSION: No evidence of acute cervical spine fracture, traumatic subluxation or static signs of instability. Multilevel spondylosis, similar to previous MR    PATIENT SURVEYS:  NDI 17 (34%) 06/08/23: 36%  COGNITION: Overall cognitive status: Within functional limits for tasks assessed  SENSATION: WFL  POSTURE: rounded shoulders, forward head, and thoracic and lumbar scoliosis  PALPATION: Tender over sub occiptal area.  Taut bands and trigger points bilateral upper traps, levator paraspinals  and parascapular areas.   CERVICAL ROM:   Active ROM A/PROM (deg) eval A/PROM (deg)  06/08/23  Flexion WNL WNL  Extension WNL WNL  Right lateral flexion 30 35  Left lateral flexion 55 55  Right rotation 58 60  Left rotation 60 60   (Blank rows = not tested)  UPPER EXTREMITY ROM:  WFL, left shoulder slightly more restricted than left.   UPPER EXTREMITY MMT:  Generally 4/5 with exception of left shoulder scaption with IR and ER which were 3+/5  CERVICAL SPECIAL TESTS:  Spurling's test: Negative and Distraction test: Negative   TODAY'S TREATMENT:                                                                                                                              DATE:  07/25/23 therex Sidelying thoracic rotation x15 reps Supine horizontal abduction 2x10 reps yellow TB Seated upper trap stretch 3x10 sec hold  Seated BUE ER yellow TB 2x10 reps Seated thoracic ext/neck ext reach overhead x10 reps  Manual:  STM sub-occipitals and cervical paraspinals Gentle sub-occipital traction  07/19/23: Nustep x 5 min level 3 Discussion about progress and how we may proceed based on progress.  Discussed importance of attendance to PT and compliance with HEP.   Had patient attempt to demonstrate HEP but she could remember very little and technique was incorrect.   Reviewed entire HEP.   Lengthy education and ideas for home exercise and plans to begin water aerobics or exercise classes.    06/21/23: UBE L1 2x2 fwd/bwd PT present to discuss status Red tband: bilateral shoulder extension and rows x 20 each Red tband: bilateral shoulder ER and horizontal abduction 2x10 in circuit Wall push ups x10 Seated lumbar flexion blue physioball rollouts x 10 and flexion/SB x 5 rounds Open books x 8 each way Discussion about how rotation exercise helps with constipation and use of abdominal massage and tapping along descending colon can help with gas and constipation  4 D ball rolls with light  blue plyo ball x 20 each direction Back against wall: cervical retraction 3x10",  scapular retraction 3x10", combined 3x10" Seated neck rotation in retraction x 10 Lat pull down 2 x 10 with 30 lbs    06/14/23 Red tband: bilateral shoulder extension and rows x 20 each Red tband: bilateral shoulder ER and horizontal abduction UBE x 6 min (3/3) 3 way scapular stabilization x 10 each shoulder 4 D ball rolls with light blue plyo ball x 20 each direction Wall push ups plus x 10 Back against wall: cervical retraction x 20,  scapular retraction x 20 Lat pull down 2 x 10 with 30 lbs Attempted "thread the needle in quadruped" but patient could not tolerate quadruped position Side lying open book x 10  Taught patient hip flexor stretch off edge of mat table due to hip flexor tightness noted in supine which would affect her scoliosis and neck issues.    DATE: 06/08/23 UBE x 6 min (3/3) level 1 (PT present to discuss status) (Bathroom break x approx  5-7 min due to stomach issues) Seated bilateral shoulder ER with red band 2 x 10 Seated bilateral shoulder horizontal abduction with red band 2 x 10 During the visit, patient mentions her neck issues, her eyes need surgery, she has the GI issues  and then said she was going to see Dr. Agapito Games because "my legs are little"  She then says she has sleep apnea but doesn't use her machine any longer.  We took some time to emphasize the importance of being transparent with her physicians so that they have an accurate picture of her history to be able to treat her accordingly.   Completed recert visit   PATIENT EDUCATION:  Education details: Initiated HEP and discussed pain control using ice or heat along with medication Person educated: Patient Education method: Explanation, Demonstration, Verbal cues, and Handouts Education comprehension: verbalized understanding, returned demonstration, and verbal cues required  HOME EXERCISE PROGRAM: Access Code:  [email or web address] URL: [email or web address] Date: 05/05/2023 Prepared by: Mikey Kirschner  Exercises - Seated Cervical Retraction  - 1 x daily - 7 x weekly - 1 sets - 10 reps - 2-3 sec hold - Seated Cervical Rotation AROM  - 1 x daily - 7 x weekly - 1 sets - 10 reps - 2-3 sec hold - Seated Assisted Cervical Rotation with Towel  - 1 x daily - 7 x weekly - 1 sets - 10 reps - 2-3 sec hold - Seated Cervical Sidebending AROM  - 1 x daily - 7 x weekly - 1 sets - 10 reps - 2-3 sec hold - Shoulder extension with resistance - Neutral  - 2 x daily - 7 x weekly - 2 sets - 10 reps - Standing Shoulder Row with Anchored Resistance  - 2 x daily - 7 x weekly - 2 sets - 10 reps - Shoulder External Rotation and Scapular Retraction with Resistance  - 2 x daily - 7 x weekly - 2 sets - 10 reps - Standing Shoulder Horizontal Abduction with Resistance  - 2 x daily - 7 x weekly - 2 sets - 10 reps  ASSESSMENT:  CLINICAL IMPRESSION: Pt continues to have difficulty with HEP consistency. PT encouraged her to pick this back up now that she is in town. Session focused on exercises and manual techniques to improve cervical ROM and scap/thoracic flexibility. Pt was able to perform stretches with moderate verbal cues from PT. Also completed soft tissue mobilization and discussed self sub-occipital release with tennis balls for home. Although her active cervical rotation ROM was limited, it was pain free at the end of today's session.   OBJECTIVE IMPAIRMENTS: decreased ROM, decreased strength, hypomobility, increased fascial restrictions, increased muscle spasms, impaired flexibility, postural dysfunction, and pain.   ACTIVITY LIMITATIONS: carrying, lifting, bending, sleeping, bed mobility, bathing, dressing, reach over head, and hygiene/grooming  PARTICIPATION LIMITATIONS: meal prep, cleaning, laundry, driving, shopping, community activity, yard work, and church  PERSONAL FACTORS: Age and Fitness are also affecting  patient's functional outcome.   REHAB POTENTIAL: Good  CLINICAL DECISION MAKING: Stable/uncomplicated  EVALUATION COMPLEXITY: Low   GOALS: Goals reviewed with patient? Yes  SHORT TERM GOALS: Target date: 06/27/23   Pain report to be no greater than 4/10  Baseline:  Goal status: IN PROGRESS  2.  Patient will be independent with initial HEP  Baseline:  Goal status: MET  LONG TERM GOALS: Target date: 08/03/2023   Patient to report pain no greater than 2/10  Baseline:  Goal status: IN PROGRESS  2.  Patient  to be independent with advanced HEP  Baseline:  Goal status: IN PROGRESS  3.  Patient to be able to sleep through the night  Baseline:  Goal status: INITIAL  4.  Patient to report 85% improvement in overall symptoms Baseline:  Goal status: INITIAL  5.  NDI to improve to 60% Baseline: 34% Goal status: INITIAL  6.  Patient to be able to reach overhead to do basic grooming and dressing with left UE without pain Baseline:  Goal status: INITIAL   PLAN:  PT FREQUENCY: 2x/week  PT DURATION: 8 weeks  PLANNED INTERVENTIONS: Therapeutic exercises, Therapeutic activity, Neuromuscular re-education, Patient/Family education, Self Care, Joint mobilization, Aquatic Therapy, Dry Needling, Electrical stimulation, Spinal mobilization, Cryotherapy, Moist heat, Taping, Traction, Ultrasound, Ionotophoresis 4mg /ml Dexamethasone, Manual therapy, and Re-evaluation  PLAN FOR NEXT SESSION: Neck ROM and scapular stability, progress postural strengthening, possibly UBE, Pt requests no more DN     9:07 PM,07/25/23 Donita Brooks PT, DPT [facility]  [phone]

## 2023-07-27 DIAGNOSIS — R0981 Nasal congestion: Secondary | ICD-10-CM | POA: Diagnosis not present

## 2023-07-27 DIAGNOSIS — U071 COVID-19: Secondary | ICD-10-CM | POA: Diagnosis not present

## 2023-07-27 DIAGNOSIS — R5383 Other fatigue: Secondary | ICD-10-CM | POA: Diagnosis not present

## 2023-07-27 DIAGNOSIS — R051 Acute cough: Secondary | ICD-10-CM | POA: Diagnosis not present

## 2023-07-28 DIAGNOSIS — U071 COVID-19: Secondary | ICD-10-CM | POA: Diagnosis not present

## 2023-08-02 ENCOUNTER — Ambulatory Visit: Payer: Medicare Other

## 2023-08-08 ENCOUNTER — Telehealth: Payer: Self-pay | Admitting: Nurse Practitioner

## 2023-08-08 ENCOUNTER — Other Ambulatory Visit: Payer: Self-pay

## 2023-08-08 ENCOUNTER — Other Ambulatory Visit: Payer: Self-pay | Admitting: Gastroenterology

## 2023-08-08 MED ORDER — OMEPRAZOLE 20 MG PO CPDR: 20.0000 mg | DELAYED_RELEASE_CAPSULE | Freq: Two times a day (BID) | ORAL | 3 refills | Status: DC

## 2023-08-08 NOTE — Telephone Encounter (Signed)
Medication refilled

## 2023-08-08 NOTE — Telephone Encounter (Signed)
Patient needing medication refill on omeprazole. States she is experiencing abdominal cramps. Please advise.    Thank you

## 2023-08-09 DIAGNOSIS — M5416 Radiculopathy, lumbar region: Secondary | ICD-10-CM | POA: Diagnosis not present

## 2023-08-12 ENCOUNTER — Other Ambulatory Visit: Payer: Self-pay

## 2023-08-12 ENCOUNTER — Emergency Department (HOSPITAL_COMMUNITY): Payer: Medicare Other

## 2023-08-12 ENCOUNTER — Encounter (HOSPITAL_COMMUNITY): Payer: Self-pay

## 2023-08-12 ENCOUNTER — Emergency Department (HOSPITAL_COMMUNITY)
Admission: EM | Admit: 2023-08-12 | Discharge: 2023-08-12 | Disposition: A | Discharge status: Home / Self Care | Payer: Medicare Other | Attending: Emergency Medicine | Admitting: Emergency Medicine

## 2023-08-12 DIAGNOSIS — R1084 Generalized abdominal pain: Secondary | ICD-10-CM

## 2023-08-12 DIAGNOSIS — R197 Diarrhea, unspecified: Secondary | ICD-10-CM | POA: Diagnosis not present

## 2023-08-12 DIAGNOSIS — Z8616 Personal history of COVID-19: Secondary | ICD-10-CM | POA: Diagnosis not present

## 2023-08-12 DIAGNOSIS — K5790 Diverticulosis of intestine, part unspecified, without perforation or abscess without bleeding: Secondary | ICD-10-CM | POA: Diagnosis not present

## 2023-08-12 DIAGNOSIS — Z7984 Long term (current) use of oral hypoglycemic drugs: Secondary | ICD-10-CM | POA: Insufficient documentation

## 2023-08-12 DIAGNOSIS — Z7982 Long term (current) use of aspirin: Secondary | ICD-10-CM | POA: Diagnosis not present

## 2023-08-12 DIAGNOSIS — K529 Noninfective gastroenteritis and colitis, unspecified: Secondary | ICD-10-CM | POA: Diagnosis not present

## 2023-08-12 DIAGNOSIS — K5730 Diverticulosis of large intestine without perforation or abscess without bleeding: Secondary | ICD-10-CM | POA: Diagnosis not present

## 2023-08-12 DIAGNOSIS — K6389 Other specified diseases of intestine: Secondary | ICD-10-CM | POA: Diagnosis not present

## 2023-08-12 DIAGNOSIS — N200 Calculus of kidney: Secondary | ICD-10-CM | POA: Diagnosis not present

## 2023-08-12 LAB — CBC WITH DIFFERENTIAL/PLATELET
Abs Immature Granulocytes: 0.03 10*3/uL (ref 0.00–0.07)
Basophils Absolute: 0.1 10*3/uL (ref 0.0–0.1)
Basophils Relative: 1 %
Eosinophils Absolute: 0.1 10*3/uL (ref 0.0–0.5)
Eosinophils Relative: 1 %
HCT: 42 % (ref 36.0–46.0)
Hemoglobin: 13.8 g/dL (ref 12.0–15.0)
Immature Granulocytes: 0 %
Lymphocytes Relative: 16 %
Lymphs Abs: 1.3 10*3/uL (ref 0.7–4.0)
MCH: 32.2 pg (ref 26.0–34.0)
MCHC: 32.9 g/dL (ref 30.0–36.0)
MCV: 97.9 fL (ref 80.0–100.0)
Monocytes Absolute: 0.6 10*3/uL (ref 0.1–1.0)
Monocytes Relative: 7 %
Neutro Abs: 6.2 10*3/uL (ref 1.7–7.7)
Neutrophils Relative %: 75 %
Platelets: 224 10*3/uL (ref 150–400)
RBC: 4.29 MIL/uL (ref 3.87–5.11)
RDW: 12.8 % (ref 11.5–15.5)
WBC: 8.3 10*3/uL (ref 4.0–10.5)
nRBC: 0 % (ref 0.0–0.2)

## 2023-08-12 LAB — COMPREHENSIVE METABOLIC PANEL
ALT: 46 U/L — ABNORMAL HIGH (ref 0–44)
AST: 25 U/L (ref 15–41)
Albumin: 4.4 g/dL (ref 3.5–5.0)
Alkaline Phosphatase: 109 U/L (ref 38–126)
Anion gap: 9 (ref 5–15)
BUN: 24 mg/dL — ABNORMAL HIGH (ref 8–23)
CO2: 22 mmol/L (ref 22–32)
Calcium: 9.2 mg/dL (ref 8.9–10.3)
Chloride: 110 mmol/L (ref 98–111)
Creatinine, Ser: 0.98 mg/dL (ref 0.44–1.00)
GFR, Estimated: 59 mL/min — ABNORMAL LOW (ref >60)
Glucose, Bld: 118 mg/dL — ABNORMAL HIGH (ref 70–99)
Potassium: 3.8 mmol/L (ref 3.5–5.1)
Sodium: 141 mmol/L (ref 135–145)
Total Bilirubin: 0.5 mg/dL (ref 0.3–1.2)
Total Protein: 7.6 g/dL (ref 6.5–8.1)

## 2023-08-12 LAB — URINALYSIS, ROUTINE W REFLEX MICROSCOPIC
Bilirubin Urine: NEGATIVE
Glucose, UA: NEGATIVE mg/dL
Hgb urine dipstick: NEGATIVE
Ketones, ur: NEGATIVE mg/dL
Leukocytes,Ua: NEGATIVE
Nitrite: NEGATIVE
Protein, ur: NEGATIVE mg/dL
Specific Gravity, Urine: 1.023 (ref 1.005–1.030)
pH: 5 (ref 5.0–8.0)

## 2023-08-12 LAB — LIPASE, BLOOD: Lipase: 62 U/L — ABNORMAL HIGH (ref 11–51)

## 2023-08-12 MED ORDER — SODIUM CHLORIDE 0.9 % IV SOLN: INTRAVENOUS | Status: DC

## 2023-08-12 MED ORDER — AMOXICILLIN-POT CLAVULANATE 875-125 MG PO TABS
1.0000 | ORAL_TABLET | Freq: Once | ORAL | Status: AC
  Administered 2023-08-12: 1 via ORAL
  Filled 2023-08-12: qty 1

## 2023-08-12 MED ORDER — SODIUM CHLORIDE (PF) 0.9 % IJ SOLN
INTRAMUSCULAR | Status: AC
  Filled 2023-08-12: qty 50

## 2023-08-12 MED ORDER — ONDANSETRON 4 MG PO TBDP: 4.0000 mg | ORAL_TABLET | Freq: Three times a day (TID) | ORAL | 0 refills | Status: DC | PRN

## 2023-08-12 MED ORDER — IOHEXOL 300 MG/ML  SOLN
100.0000 mL | Freq: Once | INTRAMUSCULAR | Status: AC | PRN
  Administered 2023-08-12: 100 mL via INTRAVENOUS

## 2023-08-12 MED ORDER — AMOXICILLIN-POT CLAVULANATE 875-125 MG PO TABS: 1.0000 | ORAL_TABLET | Freq: Two times a day (BID) | ORAL | 0 refills | Status: DC

## 2023-08-12 NOTE — ED Triage Notes (Addendum)
Patient presented to ER with abdominal pain and diarrhea. Patient states she has a history of diverticulitis. Urgent care sent to be worked up for apendicitis vs diverticulitis. Patient denies fevers.

## 2023-08-12 NOTE — ED Provider Notes (Signed)
[place] EMERGENCY DEPARTMENT AT [facility] Provider Note   CSN: [id number] Arrival date & time: 08/12/23  1310     History  Chief Complaint  Patient presents with   Abdominal Pain    Kristen Mahoney is a 77 y.o. female.  HPI Patient presents to urgent care due to concern for abdominal pain.  Patient's history of diverticulitis, but has been generally well.  Over the past few days, after she recovered from COVID she has noticed malodorous stool as well as increasing diffuse abdominal pain.  There are some anorexia, but no vomiting, no fever. The patient was urgent care and was admitted for consideration of her abdominal pain.    Home Medications Prior to Admission medications   Medication Sig Start Date End Date Taking? Authorizing Provider  amoxicillin-clavulanate (AUGMENTIN) 875-125 MG tablet Take 1 tablet by mouth every 12 (twelve) hours. 08/12/23  Yes Gerhard Munch, MD  ondansetron (ZOFRAN-ODT) 4 MG disintegrating tablet Take 1 tablet (4 mg total) by mouth every 8 (eight) hours as needed for nausea or vomiting. 08/12/23  Yes Gerhard Munch, MD  ACCU-CHEK GUIDE test strip USE TO CHECK BLOOD SUGAR TWO TO THREE TIMES A WEEK AS DIRECTED 02/18/22   [provider]  acetaminophen (TYLENOL) 500 MG tablet Take 1,000 mg by mouth every 6 (six) hours as needed (for pain.).    [provider]  aspirin EC 81 MG tablet Take 81 mg by mouth every evening.     [provider]  atorvastatin (LIPITOR) 20 MG tablet Take 20 mg by mouth daily.  02/14/17   [provider]  betamethasone valerate ointment (VALISONE) 0.1 % Use a pea sized amount topically BID for up to one week as needed. 10/20/21   Romualdo Bolk, MD  buPROPion (WELLBUTRIN XL) 150 MG 24 hr tablet Take 150 mg by mouth daily. 08/08/19   [provider]  Diaphragm Arc-[place]) [facility] Place 1 Device vaginally as needed. 09/07/21   Dohmeier, Porfirio Mylar, MD  diclofenac Sodium  (VOLTAREN) 1 % GEL Apply 2 g topically daily as needed (pain). Patient not taking: Reported on 06/09/2023    [provider]  fluticasone (FLONASE) 50 MCG/ACT nasal spray Place 1 spray into both nostrils daily as needed for allergies.  Patient not taking: Reported on 06/09/2023 04/02/19   [provider]  gabapentin (NEURONTIN) 100 MG capsule Take 1 capsule (100 mg total) by mouth at bedtime. Patient not taking: Reported on 06/09/2023 05/27/22   Judi Saa, DO  hydrocortisone 1 % ointment Apply 1 application topically 3 (three) times daily. Patient not taking: Reported on 06/09/2023 09/26/18   Esterwood, Amy S, PA-C  hyoscyamine (LEVSIN SL) 0.125 MG SL tablet DISSOLVE 1 TABLET IN MOUTH TWICE DAILY AS NEEDED 08/09/23   Napoleon Form, MD  ibuprofen (ADVIL) 400 MG tablet Take 1 tablet (400 mg total) by mouth every 6 (six) hours as needed for moderate pain or mild pain. Patient not taking: Reported on 06/09/2023 12/22/21   Renne Crigler, PA-C  lidocaine (LIDODERM) 5 % Place 1 patch onto the skin daily. Remove & Discard patch within 12 hours or as directed by MD Patient not taking: Reported on 06/09/2023 09/14/22   Napoleon Form, MD  loratadine (CLARITIN) 10 MG tablet Take 10 mg by mouth daily. Patient not taking: Reported on 06/09/2023    [provider]  losartan (COZAAR) 25 MG tablet Take 25 mg by mouth daily. Patient not taking: Reported on 06/09/2023 11/26/22  [provider]  metFORMIN (GLUCOPHAGE-XR) 500 MG 24 hr tablet Take 500 mg by mouth daily.  07/23/19   [provider]  metoprolol succinate (TOPROL XL) 50 MG 24 hr tablet Take 1 tablet (50 mg total) by mouth daily. Take with or immediately following a meal. 12/29/12   Mabe, Latanya Maudlin, MD  Multiple Vitamins-Minerals (ICAPS) CAPS Take 1 capsule by mouth 2 (two) times daily.     [provider]  omeprazole (PRILOSEC) 20 MG capsule Take 1 capsule (20 mg total) by mouth 2 (two) times daily  before a meal. 08/08/23   Nandigam, Eleonore Chiquito, MD  ondansetron (ZOFRAN-ODT) 4 MG disintegrating tablet Take 1 tablet (4 mg total) by mouth every 8 (eight) hours as needed for nausea or vomiting. Patient not taking: Reported on 06/09/2023 12/22/21   Renne Crigler, PA-C  Polyethyl Glycol-Propyl Glycol (SYSTANE) 0.4-0.3 % SOLN Place 1 drop into both eyes 2 (two) times daily.    [provider]  RESTASIS 0.05 % ophthalmic emulsion Place 1 drop into both eyes every 12 (twelve) hours. 06/28/19   [provider]  tamsulosin (FLOMAX) 0.4 MG CAPS capsule Take 1 capsule (0.4 mg total) by mouth daily. Patient not taking: Reported on 06/09/2023 12/22/21   Renne Crigler, PA-C  topiramate (TOPAMAX) 25 MG tablet Take 1 tablet (25 mg total) by mouth 2 (two) times daily. For headache prevention 06/21/23   Rodolph Bong, MD  Vitamin D, Cholecalciferol, 400 units TABS Take 400 Units by mouth daily.     [provider]  vitamin E 180 MG (400 UNITS) capsule Take 400 Units by mouth daily.    [provider]      Allergies    Ciprofloxacin, Morphine and codeine, Prozac [fluoxetine], Zoloft [sertraline hcl], and Nortriptyline hcl    Review of Systems   Review of Systems  All other systems reviewed and are negative.   Physical Exam Updated Vital Signs BP (!) 149/76 (BP Location: Right Arm)   Pulse 98   Temp 98 F (36.7 C) (Oral)   Resp 18   Ht 5\' 1"  (1.549 m)   Wt 59 kg   SpO2 100%   BMI 24.58 kg/m  Physical Exam Vitals and nursing note reviewed.  Constitutional:      General: She is not in acute distress.    Appearance: She is well-developed.  HENT:     Head: Normocephalic and atraumatic.  Eyes:     Conjunctiva/sclera: Conjunctivae normal.  Cardiovascular:     Rate and Rhythm: Normal rate and regular rhythm.  Pulmonary:     Effort: Pulmonary effort is normal. No respiratory distress.     Breath sounds: Normal breath sounds. No stridor.  Abdominal:     General:  There is no distension.     Tenderness: There is generalized abdominal tenderness.  Skin:    General: Skin is warm and dry.  Neurological:     Mental Status: She is alert and oriented to person, place, and time.     Cranial Nerves: No cranial nerve deficit.  Psychiatric:        Mood and Affect: Mood normal.     ED Results / Procedures / Treatments   Labs (all labs ordered are listed, but only abnormal results are displayed) Labs Reviewed  COMPREHENSIVE METABOLIC PANEL - Abnormal; Notable for the following components:      Result Value   Glucose, Bld 118 (*)    BUN 24 (*)    ALT 46 (*)  GFR, Estimated 59 (*)    All other components within normal limits  LIPASE, BLOOD - Abnormal; Notable for the following components:   Lipase 62 (*)    All other components within normal limits  URINALYSIS, ROUTINE W REFLEX MICROSCOPIC - Abnormal; Notable for the following components:   APPearance HAZY (*)    All other components within normal limits  CBC WITH DIFFERENTIAL/PLATELET    EKG None  Radiology CT ABDOMEN PELVIS W CONTRAST  Result Date: 08/12/2023 CLINICAL DATA:  Nonlocalized abdominal pain EXAM: CT ABDOMEN AND PELVIS WITH CONTRAST TECHNIQUE: Multidetector CT imaging of the abdomen and pelvis was performed using the standard protocol following bolus administration of intravenous contrast. RADIATION DOSE REDUCTION: This exam was performed according to the departmental dose-optimization program which includes automated exposure control, adjustment of the mA and/or kV according to patient size and/or use of iterative reconstruction technique. CONTRAST:  OMNIPAQUE IOHEXOL 300 MG/ML  SOLN COMPARISON:  CT 12/22/2021, 01/27/2022 other exams as well FINDINGS: Lower chest: There is some linear opacity lung bases likely scar or atelectasis. No pleural effusion. There is some breathing motion. Hepatobiliary: Fatty liver infiltration identified. Previous cholecystectomy. Slight ectasia of the  biliary tree but unchanged from previous and there is normal tapering of the duct towards the pancreatic head. Pancreas: Unremarkable. No pancreatic ductal dilatation or surrounding inflammatory changes. Spleen: Normal in size without focal abnormality. Adrenals/Urinary Tract: Adrenal glands are preserved. There is mild-to-moderate bilateral renal atrophy. No collecting system dilatation. There are a few punctate nonobstructing right-sided renal stones ureters have normal course and caliber extending down to the bladder. Preserved contours of the urinary bladder which is low-lying. There is a small low-attenuation lesion identified along the anterior aspect of the left kidney, too small to completely characterize but not clearly a cyst measuring 7 mm on series 7, image 23. Stomach/Bowel: Stomach is nondilated. Small bowel is nondilated. Normal appendix in the right lower quadrant. Large bowel is of normal course and caliber with scattered stool. There is some slight wall thickening along the transverse colon with some vascular congestion. A subtle colitis is possible. Please correlate with symptoms. Vascular/Lymphatic: Aortic atherosclerosis. No enlarged abdominal or pelvic lymph nodes. Reproductive: Status post hysterectomy. No adnexal masses. Other: Slightly low-lying pelvic floor structures. Please correlate for any clinical symptoms of prolapse. Musculoskeletal: Scattered degenerative changes of the spine and pelvis. Multilevel canal stenosis along the lumbar spine. IMPRESSION: Slight wall thickening with some vascular engorgement along the transverse colon. Please correlate for any clinical evidence of colitis. Normal appendix. No bowel obstruction. Left-sided colonic diverticula. Fatty liver infiltration.  Previous cholecystectomy. Nonobstructing right-sided renal stones. Small low-attenuation lesion along the left kidney is not clearly a simple cyst. Recommend follow up imaging in 6 months. Electronically  Signed   By: Karen Kays M.D.   On: 08/12/2023 16:09    Procedures Procedures    Medications Ordered in ED Medications  0.9 %  sodium chloride infusion (0 mLs Intravenous Stopped 08/12/23 1557)  amoxicillin-clavulanate (AUGMENTIN) 875-125 MG per tablet 1 tablet (has no administration in time range)  iohexol (OMNIPAQUE) 300 MG/ML solution 100 mL (100 mLs Intravenous Contrast Given 08/12/23 1508)    ED Course/ Medical Decision Making/ A&P                                 Medical Decision Making Elderly female with multiple medical issues including recent COVID, prior diverticulitis, IBS prior rectovaginal  fistula presents with abdominal pain.  Differential including post-COVID symptoms, diverticulitis, viral syndrome considered.  Patient's initial vital signs reassuring.  Cardiac 95 sinus normal Pulse ox 100% room air normal CT labs fluids ordered.  Amount and/or Complexity of Data Reviewed External Data Reviewed: notes. Labs: ordered. Decision-making details documented in ED Course. Radiology: ordered and independent interpretation performed. Decision-making details documented in ED Course.  Risk Prescription drug management. Decision regarding hospitalization.   4:22 PM On repeat exam patient is awake, alert, in no distress sitting upright, hemodynamically unremarkable.  CT consistent with colitis, consistent with patient's presentation.  No evidence for perforation, recommend Sepsis.  Patient comfortable with initiation of meds here, will follow-up with her GI team next week.        Final Clinical Impression(s) / ED Diagnoses Final diagnoses:  Generalized abdominal pain  Colitis    Rx / DC Orders ED Discharge Orders          Ordered    amoxicillin-clavulanate (AUGMENTIN) 875-125 MG tablet  Every 12 hours        08/12/23 1621    ondansetron (ZOFRAN-ODT) 4 MG disintegrating tablet  Every 8 hours PRN        08/12/23 1621              Gerhard Munch,  MD 08/12/23 1622

## 2023-08-12 NOTE — Discharge Instructions (Signed)
You have been diagnosed with colitis.  This is similar to diverticulitis, and involves the large intestine similarly.  Please stay well-hydrated, monitor your condition carefully and follow-up with your gastroenterologist.  Return here for concerning changes in your condition.

## 2023-08-17 ENCOUNTER — Ambulatory Visit: Payer: Medicare Other

## 2023-08-18 ENCOUNTER — Encounter: Payer: Self-pay | Admitting: Neurology

## 2023-08-18 ENCOUNTER — Ambulatory Visit (INDEPENDENT_AMBULATORY_CARE_PROVIDER_SITE_OTHER): Payer: Medicare Other | Admitting: Neurology

## 2023-08-18 VITALS — BP 121/70 | HR 87 | Ht 61.0 in | Wt 121.0 lb

## 2023-08-18 DIAGNOSIS — Z1231 Encounter for screening mammogram for malignant neoplasm of breast: Secondary | ICD-10-CM | POA: Diagnosis not present

## 2023-08-18 DIAGNOSIS — Z9989 Dependence on other enabling machines and devices: Secondary | ICD-10-CM

## 2023-08-18 NOTE — Progress Notes (Signed)
Provider:  Melvyn Novas, MD  Primary Care Physician:  Jarrett Soho, PA-C [address] [place] [address]     Referring Provider: Jarrett Soho, Pa-c [address] [place],  [place] [address]          Chief Complaint according to patient   Patient presents with:     New Patient (Initial Visit)      No longer on CPAP and not here for sleep- she is here for ? " Just general health check"      HISTORY OF PRESENT ILLNESS:  Kristen Mahoney is a 77 y.o. female patient who is here for revisit 08/18/2023 after 2 year hiatus.  Chief concern according to patient :  " I have scoliosis.  I have headaches.  She has noted some muscle weakness over several months and a slow progression.  The left leg is more affected than the right.  Since she has received a diagnosis of scoliosis it this was also considered to be a contributing factor.  She noticed tingling and crawling sensations along the shin.  She does have some known knee arthritis and has even had intra joint injections but she feels that her muscle mass is decreasing.  At this time she has also dry eyes, she is taking bupropion hydrochloride she is taking baby aspirin and she is taking Systane eyedrops, she was given recently amoxicillin after a she had a gastrointestinal infection and states that she had diarrhea with it and was put on amoxicillin however she hates the side effects, the smelly stool, she still feels that she is not back to normal.  She has 1 more day of medication intake.  I asked her to stop with some probiotics to help her establish a better regular flora again.  She is following with a neurosurgeon for the lumbar disc herniation that she had been diagnosed in the past.  She does have a history of irritable bowel.  She has rectal vaginal fistula and incontinence in the past had surgery with Dr. Dickie La in April 2014.  This she also had uterine fibroids benign breast cysts, diverticulosis, minor  urinary incontinence and diabetes type 2.  Current hemoglobin A1c I do not see a very recent level.  The only 1 I have here available is from November 2022 and the A1c was 6.3 at the time.      RV on  09-07-2021 , ; last previous visit 05-11-2021  with Butch Penny, NP. Reinaldo Raddle has lost a lot of weight, she looks well, is now 77 years old,. She reports memory trouble with the content of movies she watches in PM ( but she may doze off )  has some vertigo, has some unintended weight loss, left side of her head hurts  often. She feels as if there a bubbles under the scalp, not too painful but irritation.  HST to see if she still needs sleep CPAP - I just noted her last HST was 02-2021, and indicative of severe apnea. This is surprising.     RCV on 11-05-2020:   " I am still not sleeping- waking too early, independent of CPAP use.  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 she still has episodes of dizziness and complains this is mostly on the left side of head.  set up date for machine was 2014 pt is eligible for a new machine. Download was not possible -    PS : she feels better since watching her hydration. Addendum: 11-07-2019, I am dictating the CPAP auto titrator compliance report for Kristen Mahoney, encompassing the dates from 12 October through 06 November 2019.   The patient has used her CPAP on 27 out of 30 days, but on 9 days was not able to fulfill the 4-hour minimum requirement.   Average user time is just 4-1/2 hours.   She still using an S9 AutoSet which will be replaced soon currently with a minimum pressure of 5 and a maximum pressure of 15 and an EPR level of 1 AHI is 3.8 this to be related to central apneas there is not much air leak actually noted this is a moderate degree of air leak with a 95th percentile of 21.2 L/min.  The 95th percentile  pressure is 11.4 cmH2O and is well covered with in the current pressure window.  Her compliance is only 60% and has to improve.     I know that her machine is due for replacement but it seems at this time still to work well and she could keep it as a second machine when she travels for example.    I have pleasure of seeing Kristen Mahoney today who has been a CPAP user since 2008 when she was originally diagnosed with obstructive sleep apnea at an AHI of 29.8. She achieved a lot of weight loss and was for this reason placed on an auto titrating. Last year the 95%t percentile pressure was 12.5 cm water and this year it is 10.8 cm water. This correlates with further weight loss, 23 pounds.  A residual AHI is 3.2 but the majority is central in nature, she continues on her AutoSet was 1 cm EPR she has used to machine 28 out of 30 days but only 18 days for longer than 4 hours. Her sleep has been more fragmented since she is depressed and she wakes up in the middle of the night having removed the mask. She has a dry mouth . This was not all intentional weight loss, some of this may be attributed to depression. She felt severely depressed she had finally weaned off alprazolam, and she was started on an SSRI, namely Zoloft. She still has a feeling of in a unrest and jitteriness, anxiety and she is excessively daytime sleepy. All this fits depression. Her symptoms have gotten better since she started Zoloft but they have not been yet controlled. I think it may help if she also exercises daily. She has rectal-stool incontinence which makes it hard for her to swim. Epworth 6 points, FSS 20 points.    I have not encountered Kristen Mahoney and a little over 3 years. The patient is here as a new patient as-established patient revisit. She has seen me in the past for her sleep disorder which is obstructive sleep apnea. Her primary care physician is now Turkey Rankins. She needs new CPAP supplies and ongoing follow-up and for this  reason brought his CPAP machine here today. As I have seen Kristen Mahoney last she has become a grandmother her grandson just celebrated his second birthday. She is a mother of 2 adult children. She is retired. He is also followed by orthopedist Dr. Hessie Diener and by cardiologist Dr. Sharyn Lull . She has diabetes which has been well controlled as long as she was able to exercise and walk.  Due to knee pain she has been less able to exercise. She has no medication side effects that she reports at this time she feels extraordinarily well but she needs new supplies.  Mrs. Leask's sleep study from 09-05-2007 revealed an AHI of 29.8, supine AHI of 47.4 and nonsupine of 11.1 REM AHI of 32. Periodic leg movements for a few was an arousal index of 3.6 she was snoring. She was titrated to CPAP but then placed on an auto titrator as she achieved weight loss.    Review of Systems: Out of a complete 14 system review, the patient complains of only the following symptoms, and all other reviewed systems are negative.:   REM    How likely are you to doze in the following situations: 0 = not likely, 1 = slight chance, 2 = moderate chance, 3 = high chance   Sitting and Reading? Watching Television? Sitting inactive in a public place (theater or meeting)? As a passenger in a car for an hour without a break? Lying down in the afternoon when circumstances permit? Sitting and talking to someone? Sitting quietly after lunch without alcohol? In a car, while stopped for a few minutes in traffic?   Total = 5/ 24 points   FSS endorsed at 19/ 63 points.  GDS 5/ 15   Social History   Socioeconomic History   Marital status: Married    Spouse name: Not on file   Number of children: 2   Years of education: Not on file   Highest education level: Not on file  Occupational History   Occupation: Retired    Associate Professor: RETIRED  Tobacco Use   Smoking status: Former   Smokeless tobacco: Never  Advertising account planner   Vaping status: Never Used   Substance and Sexual Activity   Alcohol use: Not Currently    Comment: rare   Drug use: No   Sexual activity: Not Currently    Birth control/protection: Surgical    Comment: Hyst, Hx of TV+ & CT+  Other Topics Concern   Not on file  Social History Narrative   Denies caffeine use.   Social Determinants of Health   Financial Resource Strain: Not on file  Food Insecurity: Not on file  Transportation Needs: Not on file  Physical Activity: Not on file  Stress: Not on file  Social Connections: Not on file    Family History  Problem Relation Age of Onset   Diabetes Mother    Diabetes Father    Colon cancer Father    Heart disease Father    Hypertension Father    Diabetes Sister    Hypertension Sister    Diabetes Brother    Colon polyps Brother    Prostate cancer Brother    Breast cancer Paternal Aunt        Age 65's   Colon polyps Brother    Heart disease Brother    Stroke Brother    Hyperlipidemia Brother    Colon polyps Sister    Diabetes Sister    Hypertension Sister    Heart disease Sister    Esophageal cancer Neg Hx    Rectal cancer Neg Hx    Stomach cancer Neg Hx     Past Medical History:  Diagnosis Date   Allergy    seasonal   Anxiety    Arthritis    knees,all over   Basal cell carcinoma    Benign breast cyst in female    PATIENT HAS HISTORY OF  BREAST CYSTS   Cataract    beginning bilateral   Diabetes mellitus    GERD (gastroesophageal reflux disease)    Headache    Herpes progenitalis    Hyperlipidemia    Hypertension    Kidney stone    Kidney stones    OSA on CPAP    Sleep apnea    cpap   Urinary, incontinence, stress female    Vertigo     Past Surgical History:  Procedure Laterality Date   ABDOMINAL HYSTERECTOMY  1986   leiomyomata   Basal cell excised     breast cystectomy     right   CHOLECYSTECTOMY  2003   COLONOSCOPY     CYSTOSCOPY/URETEROSCOPY/HOLMIUM LASER/STENT PLACEMENT Right 01/12/2022   Procedure:  CYSTOSCOPY/RETROGRADE/ URETEROSCOPY/HOLMIUM LASER/STENT PLACEMENT;  Surgeon: Noel Christmas, MD;  Location: [facility];  Service: Urology;  Laterality: Right;   ear cystectomy     ESOPHAGOGASTRODUODENOSCOPY     KNEE ARTHROSCOPY     right   LITHOTRIPSY     RECTOVAGINAL FISTULA CLOSURE     WISDOM TOOTH EXTRACTION       Current Outpatient Medications on File Prior to Visit  Medication Sig Dispense Refill   ACCU-CHEK GUIDE test strip USE TO CHECK BLOOD SUGAR TWO TO THREE TIMES A WEEK AS DIRECTED     acetaminophen (TYLENOL) 500 MG tablet Take 1,000 mg by mouth every 6 (six) hours as needed (for pain.).     amoxicillin-clavulanate (AUGMENTIN) 875-125 MG tablet Take 1 tablet by mouth every 12 (twelve) hours. 14 tablet 0   aspirin EC 81 MG tablet Take 81 mg by mouth every evening.      atorvastatin (LIPITOR) 20 MG tablet Take 20 mg by mouth daily.      betamethasone valerate ointment (VALISONE) 0.1 % Use a pea sized amount topically BID for up to one week as needed. 30 g 0   buPROPion (WELLBUTRIN XL) 150 MG 24 hr tablet Take 150 mg by mouth daily.     Diaphragm Arc-Spring (CAYA) DPRH Place 1 Device vaginally as needed. 10 each 1   diclofenac Sodium (VOLTAREN) 1 % GEL Apply 2 g topically daily as needed (pain).     fluticasone (FLONASE) 50 MCG/ACT nasal spray Place 1 spray into both nostrils daily as needed for allergies.     gabapentin (NEURONTIN) 100 MG capsule Take 1 capsule (100 mg total) by mouth at bedtime. 30 capsule 3   hydrocortisone 1 % ointment Apply 1 application topically 3 (three) times daily. 30 g 0   hyoscyamine (LEVSIN SL) 0.125 MG SL tablet DISSOLVE 1 TABLET IN MOUTH TWICE DAILY AS NEEDED 60 tablet 0   ibuprofen (ADVIL) 400 MG tablet Take 1 tablet (400 mg total) by mouth every 6 (six) hours as needed for moderate pain or mild pain. 20 tablet 0   lidocaine (LIDODERM) 5 % Place 1 patch onto the skin daily. Remove & Discard patch within 12 hours or as directed by MD 30 patch 11    loratadine (CLARITIN) 10 MG tablet Take 10 mg by mouth daily.     losartan (COZAAR) 25 MG tablet Take 25 mg by mouth daily.     metFORMIN (GLUCOPHAGE-XR) 500 MG 24 hr tablet Take 500 mg by mouth daily.      metoprolol succinate (TOPROL XL) 50 MG 24 hr tablet Take 1 tablet (50 mg total) by mouth daily. Take with or immediately following a meal. 30 tablet 0   Multiple Vitamins-Minerals (ICAPS) CAPS Take  1 capsule by mouth 2 (two) times daily.      omeprazole (PRILOSEC) 20 MG capsule Take 1 capsule (20 mg total) by mouth 2 (two) times daily before a meal. 180 capsule 3   ondansetron (ZOFRAN-ODT) 4 MG disintegrating tablet Take 1 tablet (4 mg total) by mouth every 8 (eight) hours as needed for nausea or vomiting. 10 tablet 0   ondansetron (ZOFRAN-ODT) 4 MG disintegrating tablet Take 1 tablet (4 mg total) by mouth every 8 (eight) hours as needed for nausea or vomiting. 20 tablet 0   Polyethyl Glycol-Propyl Glycol (SYSTANE) 0.4-0.3 % SOLN Place 1 drop into both eyes 2 (two) times daily.     RESTASIS 0.05 % ophthalmic emulsion Place 1 drop into both eyes every 12 (twelve) hours.     tamsulosin (FLOMAX) 0.4 MG CAPS capsule Take 1 capsule (0.4 mg total) by mouth daily. 7 capsule 0   topiramate (TOPAMAX) 25 MG tablet Take 1 tablet (25 mg total) by mouth 2 (two) times daily. For headache prevention 180 tablet 2   Vitamin D, Cholecalciferol, 400 units TABS Take 400 Units by mouth daily.      vitamin E 180 MG (400 UNITS) capsule Take 400 Units by mouth daily.     Current Facility-Administered Medications on File Prior to Visit  Medication Dose Route Frequency Provider Last Rate Last Admin   betamethasone acetate-betamethasone sodium phosphate (CELESTONE) injection 3 mg  3 mg Intra-articular Once Felecia Shelling, DPM        Allergies  Allergen Reactions   Ciprofloxacin Rash   Morphine And Codeine Itching   Prozac [Fluoxetine] Other (See Comments)    sweats, jittery   Zoloft [Sertraline Hcl] Other (See  Comments)    sweating, jittery   Nortriptyline Hcl Other (See Comments)    hallucinations     DIAGNOSTIC DATA (LABS, IMAGING, TESTING) - I reviewed patient records, labs, notes, testing and imaging myself where available.  Lab Results  Component Value Date   WBC 8.3 08/12/2023   HGB 13.8 08/12/2023   HCT 42.0 08/12/2023   MCV 97.9 08/12/2023   PLT 224 08/12/2023      Component Value Date/Time   NA 141 08/12/2023 1410   K 3.8 08/12/2023 1410   CL 110 08/12/2023 1410   CO2 22 08/12/2023 1410   GLUCOSE 118 (H) 08/12/2023 1410   BUN 24 (H) 08/12/2023 1410   CREATININE 0.98 08/12/2023 1410   CALCIUM 9.2 08/12/2023 1410   PROT 7.6 08/12/2023 1410   ALBUMIN 4.4 08/12/2023 1410   AST 25 08/12/2023 1410   ALT 46 (H) 08/12/2023 1410   ALKPHOS 109 08/12/2023 1410   BILITOT 0.5 08/12/2023 1410   GFRNONAA 59 (L) 08/12/2023 1410   GFRAA 31 (L) 09/21/2019 1615   No results found for: "CHOL", "HDL", "LDLCALC", "LDLDIRECT", "TRIG", "CHOLHDL" Lab Results  Component Value Date   HGBA1C 6.4 (H) 01/08/2022   Lab Results  Component Value Date   VITAMINB12 292 05/27/2022   No results found for: "TSH"  PHYSICAL EXAM:  Today's Vitals   08/18/23 1415  BP: 121/70  Pulse: 87  Weight: 121 lb (54.9 kg)  Height: 5\' 1"  (1.549 m)   Body mass index is 22.86 kg/m.   Wt Readings from Last 3 Encounters:  08/18/23 121 lb (54.9 kg)  08/12/23 130 lb 1.1 oz (59 kg)  06/21/23 127 lb (57.6 kg)     Ht Readings from Last 3 Encounters:  08/18/23 5\' 1"  (1.549 m)  08/12/23 5'  1" (1.549 m)  06/21/23 5\' 1"  (1.549 m)      General: The patient is awake, alert and appears not in acute distress. The patient is well groomed. Head: Normocephalic, atraumatic.  Neck is supple. Cardiovascular:  Regular rate and cardiac rhythm by pulse,  without distended neck veins. Respiratory: Lungs are clear to auscultation.  Skin:  Without evidence of ankle edema, or rash. Trunk: The patient's posture is  erect.   NEUROLOGIC EXAM: The patient is awake and alert, oriented to place and time.   Memory subjective described as intact.  Attention span & concentration ability appears normal.  Speech is fluent,  without  dysarthria, dysphonia or aphasia.  Mood and affect are appropriate. GDS 5/ 15    Cranial nerves: no loss of smell or taste reported  Pupils are equal and briskly reactive to light. Funduscopic exam deferred..  Extraocular movements in vertical and horizontal planes were intact and without nystagmus. No Diplopia. Visual fields by finger perimetry are intact. Hearing was intact to soft voice and finger rubbing.    Facial sensation intact to fine touch.  Facial motor strength is symmetric and tongue and uvula move midline.  Neck ROM : rotation, tilt and flexion extension were normal for age and shoulder shrug was symmetrical.    Motor exam:  Symmetric bulk, tone and ROM.   Normal tone without cog -wheeling, symmetric grip strength .   Sensory:  Fine touch  and vibration were normal.  Proprioception tested in the upper extremities was normal.   Coordination: Rapid alternating movements intact without evidence of ataxia, dysmetria or tremor.   Gait and station: Patient could rise unassisted from a seated position, walked without assistive device.  Stance is of normal width/ base and the patient turned with 3 steps.  Toe and heel walk were deferred.  Deep tendon reflexes: in the  upper and lower extremities are symmetric and intact.  Babinski response was deferred .    ASSESSMENT AND PLAN 77 y.o. year old female  here with: Patient states she here for to discuss her arthritis all over her body.     1) Weight loss and in her opinion she has muscle mass loss.  Sarcopenia? I encouraged exercise and hydration. I see no neurological need for intervention .  2) recent fall while wearing supportive shoes/ no bony injuries. Longstanding knee arthritis. Again, PT and good diet.    3) Scoliosis. Physical therapy not yet completed.    no follow up needed.  I would like to thank Jarrett Soho, PA-C  for allowing me to meet with and to take care of this pleasant patient.     After spending a total time of  45  minutes face to face and additional time for physical and neurologic examination, review of laboratory studies,  personal review of imaging studies, reports and results of other testing and review of referral information / records as far as provided in visit,   Electronically signed by: Melvyn Novas, MD 08/18/2023 2:43 PM  [facility] and [facility] Sleep Board certified by The [employer] of Sleep Medicine and Diplomate of the [employer] of Sleep Medicine. Board certified In Neurology through the ABPN, Fellow of the [employer] of Neurology.

## 2023-08-24 ENCOUNTER — Ambulatory Visit: Payer: Medicare Other | Admitting: Gastroenterology

## 2023-09-01 DIAGNOSIS — M170 Bilateral primary osteoarthritis of knee: Secondary | ICD-10-CM | POA: Diagnosis not present

## 2023-09-01 DIAGNOSIS — M1712 Unilateral primary osteoarthritis, left knee: Secondary | ICD-10-CM | POA: Diagnosis not present

## 2023-09-01 DIAGNOSIS — M1711 Unilateral primary osteoarthritis, right knee: Secondary | ICD-10-CM | POA: Diagnosis not present

## 2023-09-02 ENCOUNTER — Encounter: Payer: Self-pay | Admitting: Physician Assistant

## 2023-09-02 ENCOUNTER — Ambulatory Visit (INDEPENDENT_AMBULATORY_CARE_PROVIDER_SITE_OTHER): Payer: Medicare Other | Admitting: Physician Assistant

## 2023-09-02 VITALS — BP 130/70 | HR 89 | Ht 61.0 in | Wt 121.0 lb

## 2023-09-02 DIAGNOSIS — Z8719 Personal history of other diseases of the digestive system: Secondary | ICD-10-CM | POA: Diagnosis not present

## 2023-09-02 DIAGNOSIS — K589 Irritable bowel syndrome without diarrhea: Secondary | ICD-10-CM

## 2023-09-02 DIAGNOSIS — R197 Diarrhea, unspecified: Secondary | ICD-10-CM | POA: Diagnosis not present

## 2023-09-02 NOTE — Patient Instructions (Addendum)
_______________________________________________________  If your blood pressure at your visit was 140/90 or greater, please contact your primary care physician to follow up on this. _______________________________________________________  If you are age 78 or older, your body mass index should be between 23-30. Your Body mass index is 22.86 kg/m. If this is out of the aforementioned range listed, please consider follow up with your Primary Care Provider. ________________________________________________________  The [facility] providers would like to encourage you to use [facility] to communicate with providers for non-urgent requests or questions.  Due to long hold times on the telephone, sending your provider a message by [facility] may be a faster and more efficient way to get a response.  Please allow 48 business hours for a response.  Please remember that this is for non-urgent requests.  _______________________________________________________  Kristen Mahoney current medications.    You can use 1/2 dose of Miralax daily as needed.  You should be using Fiber supplement daily (Metamucil, Citrucel, Benefiber)  You are scheduled to follow up with Dr Lavon Paganini on 01-26-24 at 10:40am.  Thank you for entrusting me with your care and choosing [facility].  Amy Esterwood, PA-C

## 2023-09-04 ENCOUNTER — Encounter: Payer: Self-pay | Admitting: Physician Assistant

## 2023-09-04 NOTE — Progress Notes (Signed)
Subjective:    Patient ID: Kristen Mahoney, female    DOB: 1946/11/01, 77 y.o.   MRN: [id number]  HPI Kristen Mahoney is a pleasant 77 year old female, established with Dr. Lavon Paganini.  She was last seen in November 2023 when she had colonoscopy for history of recurrent diverticulitis.  She had a 2 mm polyp removed from the cecum, and was noted to have scattered small and large mouth diverticuli in the sigmoid colon and some narrowing in the colon associated with diverticular opening, also noted to have decreased sphincter tone.  She comes in today after an ER visit on 08/12/2023.  She says she had been out of town, contracted COVID which she says was not very bad, primarily just cold symptoms however after that a week or so later she developed abdominal pain and then very malodorous stool.  This was associated with diarrhea, no melena or hematochezia. She went to the ER and underwent CT of the abdomen pelvis that showed hepatic steatosis, status postcholecystectomy, slight ectasia of the biliary tree unchanged, a few punctate right renal stones and slight wall thickening of the transverse colon with some vascular congestion question of subtle colitis. Labs showed normal CBC, normal lipase, UA negative and LFTs normal with the exception of A ALT of 46  She was given a 1 week course of Augmentin and says that her symptoms resolved.  She is also taking a probiotic in the form of acidophilus. She says she currently feels better is actually back to her usual of having some constipation and using MiraLAX on an as-needed basis.  Other medical problems include hypertension, sleep apnea with CPAP use, IBS, adult onset diabetes mellitus, osteoarthritis, chronic kidney disease stage IIIb, mitral valve prolapse and is status post hysterectomy in addition to cholecystectomy.    Review of Systems Pertinent positive and negative review of systems were noted in the above HPI section.  All other review of systems was otherwise  negative.   Outpatient Encounter Medications as of 09/02/2023  Medication Sig   ACCU-CHEK GUIDE test strip USE TO CHECK BLOOD SUGAR TWO TO THREE TIMES A WEEK AS DIRECTED   acetaminophen (TYLENOL) 500 MG tablet Take 1,000 mg by mouth every 6 (six) hours as needed (for pain.).   amoxicillin-clavulanate (AUGMENTIN) 875-125 MG tablet Take 1 tablet by mouth every 12 (twelve) hours.   aspirin EC 81 MG tablet Take 81 mg by mouth every evening.    atorvastatin (LIPITOR) 20 MG tablet Take 20 mg by mouth daily.    betamethasone valerate ointment (VALISONE) 0.1 % Use a pea sized amount topically BID for up to one week as needed.   buPROPion (WELLBUTRIN XL) 150 MG 24 hr tablet Take 150 mg by mouth daily.   Diaphragm Arc-Spring (CAYA) DPRH Place 1 Device vaginally as needed.   diclofenac Sodium (VOLTAREN) 1 % GEL Apply 2 g topically daily as needed (pain).   fluticasone (FLONASE) 50 MCG/ACT nasal spray Place 1 spray into both nostrils daily as needed for allergies.   gabapentin (NEURONTIN) 100 MG capsule Take 1 capsule (100 mg total) by mouth at bedtime.   hydrocortisone 1 % ointment Apply 1 application topically 3 (three) times daily.   hyoscyamine (LEVSIN SL) 0.125 MG SL tablet DISSOLVE 1 TABLET IN MOUTH TWICE DAILY AS NEEDED   ibuprofen (ADVIL) 400 MG tablet Take 1 tablet (400 mg total) by mouth every 6 (six) hours as needed for moderate pain or mild pain.   lactobacillus acidophilus (BACID) TABS tablet Take 2  tablets by mouth 3 (three) times daily.   lidocaine (LIDODERM) 5 % Place 1 patch onto the skin daily. Remove & Discard patch within 12 hours or as directed by MD   loratadine (CLARITIN) 10 MG tablet Take 10 mg by mouth daily.   losartan (COZAAR) 25 MG tablet Take 25 mg by mouth daily.   metFORMIN (GLUCOPHAGE-XR) 500 MG 24 hr tablet Take 500 mg by mouth daily.    metoprolol succinate (TOPROL XL) 50 MG 24 hr tablet Take 1 tablet (50 mg total) by mouth daily. Take with or immediately following a meal.    Multiple Vitamins-Minerals (ICAPS) CAPS Take 1 capsule by mouth 2 (two) times daily.    omeprazole (PRILOSEC) 20 MG capsule Take 1 capsule (20 mg total) by mouth 2 (two) times daily before a meal.   ondansetron (ZOFRAN-ODT) 4 MG disintegrating tablet Take 1 tablet (4 mg total) by mouth every 8 (eight) hours as needed for nausea or vomiting.   ondansetron (ZOFRAN-ODT) 4 MG disintegrating tablet Take 1 tablet (4 mg total) by mouth every 8 (eight) hours as needed for nausea or vomiting.   Polyethyl Glycol-Propyl Glycol (SYSTANE) 0.4-0.3 % SOLN Place 1 drop into both eyes 2 (two) times daily.   RESTASIS 0.05 % ophthalmic emulsion Place 1 drop into both eyes every 12 (twelve) hours.   tamsulosin (FLOMAX) 0.4 MG CAPS capsule Take 1 capsule (0.4 mg total) by mouth daily.   Vitamin D, Cholecalciferol, 400 units TABS Take 400 Units by mouth daily.    vitamin E 180 MG (400 UNITS) capsule Take 400 Units by mouth daily.   topiramate (TOPAMAX) 25 MG tablet Take 1 tablet (25 mg total) by mouth 2 (two) times daily. For headache prevention (Patient not taking: Reported on 09/02/2023)   Facility-Administered Encounter Medications as of 09/02/2023  Medication   betamethasone acetate-betamethasone sodium phosphate (CELESTONE) injection 3 mg   Allergies  Allergen Reactions   Ciprofloxacin Rash   Morphine And Codeine Itching   Prozac [Fluoxetine] Other (See Comments)    sweats, jittery   Zoloft [Sertraline Hcl] Other (See Comments)    sweating, jittery   Nortriptyline Hcl Other (See Comments)    hallucinations   Patient Active Problem List   Diagnosis Date Noted   Degenerative disc disease, lumbar 09/09/2022   Thoracic back pain 05/28/2022   Central sleep apnea syndrome 09/07/2021   Poor compliance with continuous positive airway pressure treatment 09/07/2021   Episodic tension-type headache, not intractable 09/07/2021   Gastroesophageal reflux disease without esophagitis 04/03/2021   History of weight  loss 04/03/2021   Poor compliance with CPAP treatment 04/03/2021   History of total vaginal hysterectomy (TVH) 03/11/2021   Acute renal failure superimposed on stage 3b chronic kidney disease (HCC) 11/06/2019   Osteoarthritis of right knee 09/21/2019   Rectal pain 08/23/2017   Rectal bleeding 08/23/2017   Incontinence of feces 07/14/2017   HTN (hypertension), malignant 07/14/2017   Anxiety and depression 07/14/2017   Weight loss, unintentional 07/14/2017   Gas pain 06/17/2017   Bloating 06/17/2017   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   OSA on CPAP 07/13/2016   Trichomonas vaginitis 08/12/2015   Type 2 diabetes mellitus without complications (HCC) 03/17/2015   Kidney stone    DIARRHEA, INFECTIOUS 09/04/2009   BLOOD IN STOOL-MELENA 09/04/2009   Abdominal pain, left lower quadrant 09/04/2009   BLOOD IN STOOL, OCCULT 09/04/2009   HYPERLIPIDEMIA 12/25/2008   Reactive depression 12/25/2008   GERD 12/25/2008   HIATAL HERNIA 12/25/2008   Diverticulosis  of colon 12/25/2008   IRRITABLE BOWEL SYNDROME 12/25/2008   RECTOVAGINAL FISTULA 12/25/2008   MITRAL VALVE PROLAPSE, HX OF 12/25/2008   Hiatal hernia 12/25/2008   Social History   Socioeconomic History   Marital status: Married    Spouse name: Not on file   Number of children: 2   Years of education: Not on file   Highest education level: Not on file  Occupational History   Occupation: Retired    Associate Professor: RETIRED  Tobacco Use   Smoking status: Former   Smokeless tobacco: Never  Advertising account planner   Vaping status: Never Used  Substance and Sexual Activity   Alcohol use: Not Currently    Comment: rare   Drug use: No   Sexual activity: Not Currently    Birth control/protection: Surgical    Comment: Hyst, Hx of TV+ & CT+  Other Topics Concern   Not on file  Social History Narrative   Denies caffeine use.   Social Determinants of Health   Financial Resource Strain: Not on file  Food Insecurity: Not on file  Transportation Needs:  Not on file  Physical Activity: Not on file  Stress: Not on file  Social Connections: Not on file  Intimate Partner Violence: Not on file    Ms. Turek's family history includes Breast cancer in her paternal aunt; Colon cancer in her father; Colon polyps in her brother, brother, and sister; Diabetes in her brother, father, mother, sister, and sister; Heart disease in her brother, father, and sister; Hyperlipidemia in her brother; Hypertension in her father, sister, and sister; Prostate cancer in her brother; Stroke in her brother.      Objective:    Vitals:   09/02/23 1003  BP: 130/70  Pulse: 89    Physical Exam  Well-developed well-nourished elderly African-American female in no acute distress.  Height, Weight, 121 BMI 22.8  HEENT; nontraumatic normocephalic, EOMI, PE R LA, sclera anicteric. Oropharynx; not examined today Neck; supple, no JVD Cardiovascular; regular rate and rhythm with S1-S2, no murmur rub or gallop Pulmonary; Clear bilaterally Abdomen; soft, nontender, nondistended, no palpable mass or hepatosplenomegaly, bowel sounds are active Rectal; not done today Skin; benign exam, no jaundice rash or appreciable lesions Extremities; no clubbing cyanosis or edema skin warm and dry Neuro/Psych; alert and oriented x4, grossly nonfocal mood and affect appropriate      Assessment & Plan:   #31 77 year old African-American female with history of recurrent diverticulitis and mild chronic constipation who had an ER visit on 08/12/2023 with complaints of abdominal pain, diarrhea and malodorous stool.  This occurred about a week after she had contracted COVID which initially was not associated with any GI symptoms but primarily congestion and cold symptoms.  Labs were reassuring as above, CT showed some slight wall thickening of the transverse colon query subtle colitis.  Symptoms were not consistent with diverticulitis, she was given an empiric course of Augmentin and has had  resolution of her symptoms since. She is currently back to having mild constipation.  This may have actually been a delayed symptom of COVID-19.  #2 history of recurrent diverticulitis #3 history of adenomatous colon polyps-up-to-date with colonoscopy last in November 2023  #4 adult onset diabetes mellitus 5.  Chronic kidney disease stage IIIb 6.  Status post hysterectomy and cholecystectomy 7.  Sleep apnea with CPAP use 8.  Hypertension 9.  IBS  Plan; no changes to current regimen today, we discussed use of fiber supplement on a daily basis in the form  of Metamucil or Benefiber, continue MiraLAX on a as needed basis, liberal water intake. She knows to call should she develop any symptoms consistent with recurrent diverticulitis.   Will plan routine follow-up with Dr. Lavon Paganini in about 6 months.  Jamaree Hosier S Tahjae Clausing PA-C 09/04/2023   Cc: Jarrett Soho, PA-C

## 2023-09-06 DIAGNOSIS — E119 Type 2 diabetes mellitus without complications: Secondary | ICD-10-CM | POA: Diagnosis not present

## 2023-09-06 DIAGNOSIS — H25042 Posterior subcapsular polar age-related cataract, left eye: Secondary | ICD-10-CM | POA: Diagnosis not present

## 2023-09-06 DIAGNOSIS — H2512 Age-related nuclear cataract, left eye: Secondary | ICD-10-CM | POA: Diagnosis not present

## 2023-09-06 DIAGNOSIS — H353131 Nonexudative age-related macular degeneration, bilateral, early dry stage: Secondary | ICD-10-CM | POA: Diagnosis not present

## 2023-09-08 ENCOUNTER — Ambulatory Visit: Payer: Medicare Other | Admitting: Gastroenterology

## 2023-09-12 DIAGNOSIS — M5416 Radiculopathy, lumbar region: Secondary | ICD-10-CM | POA: Diagnosis not present

## 2023-09-21 ENCOUNTER — Ambulatory Visit (INDEPENDENT_AMBULATORY_CARE_PROVIDER_SITE_OTHER): Payer: Medicare Other | Admitting: Family Medicine

## 2023-09-21 ENCOUNTER — Encounter: Payer: Self-pay | Admitting: Family Medicine

## 2023-09-21 VITALS — BP 102/62 | HR 77 | Ht 61.0 in | Wt 118.0 lb

## 2023-09-21 DIAGNOSIS — S060X9D Concussion with loss of consciousness of unspecified duration, subsequent encounter: Secondary | ICD-10-CM | POA: Diagnosis not present

## 2023-09-21 DIAGNOSIS — R519 Headache, unspecified: Secondary | ICD-10-CM | POA: Diagnosis not present

## 2023-09-21 MED ORDER — TOPIRAMATE 25 MG PO TABS: 25.0000 mg | ORAL_TABLET | Freq: Two times a day (BID) | ORAL | 3 refills | Status: DC

## 2023-09-21 NOTE — Patient Instructions (Signed)
Thank you for coming in today.   Continue topamax twice daily.  If you get to a point where you do not think you need them anymore ok to try stopping or just take one a day and taper off.  If you find that you need it long term ok.  If you find that you dont need it let me know and easy to quit.   Recheck as needed with me.

## 2023-09-21 NOTE — Progress Notes (Signed)
   I, Stevenson Clinch, CMA acting as a scribe for [name], MD.  Kristen Mahoney is a 77 y.o. female who presents to [facility] today for re-eval of post-concussion syndrome. Pt was last seen by Dr. Denyse Amass on 06/21/23 noting continued HA and back/flank pain. Pt notes that she had lost some weight and stopped using her CPAP, she was scheduled to fu with neurology 08/18/23. Pt was advised to continue with Topamax and it better managed her symptoms.   Today, patient reports recently getting over viral illness. Has seen neurologist who was on board with /c CPAP. Feel like she is getting restful sleep and naps throughout the day.   Overall she feels pretty well.  She sees Dr.Wong at [facility] for spinal injections.  I am currently prescribing Topamax that she takes 25 mg twice daily for headache control.  She feels that she still gets benefit from this medicine and tolerates it well.  Pertinent review of systems: No fevers or chills  Relevant historical information: Hypertension diabetes.   Exam:  BP 102/62   Pulse 77   Ht 5\' 1"  (1.549 m)   Wt 118 lb (53.5 kg)   SpO2 97%   BMI 22.30 kg/m  General: Well Developed, well nourished, and in no acute distress.   Neuropsych: Alert and oriented normal coordination and gait.  Normal speech thought process and affect.      Assessment and Plan: 77 y.o. female with resolving symptoms from concussion occurring mid February 2024. Overall she is doing quite well with only minimal symptoms.  She still seems that she is getting benefit from Topamax which we can continue to prescribe.  Talked about how to wean off of this medicine in the future if she feels like it.  Happy to be available if needed but I do not think we need any scheduled follow-ups if she continues to feel well.  Check back as needed.   PDMP not reviewed this encounter. No orders of the defined types were placed in this encounter.  Meds  ordered this encounter  Medications   topiramate (TOPAMAX) 25 MG tablet    Sig: Take 1 tablet (25 mg total) by mouth 2 (two) times daily. For headache prevention    Dispense:  180 tablet    Refill:  3     Discussed warning signs or symptoms. Please see discharge instructions. Patient expresses understanding.   The above documentation has been reviewed and is accurate and complete [name], M.D. Total encounter time 20 minutes including face-to-face time with the patient and, reviewing past medical record, and charting on the date of service.

## 2023-10-24 DIAGNOSIS — E1169 Type 2 diabetes mellitus with other specified complication: Secondary | ICD-10-CM | POA: Diagnosis not present

## 2023-10-24 DIAGNOSIS — R5383 Other fatigue: Secondary | ICD-10-CM | POA: Diagnosis not present

## 2023-10-24 DIAGNOSIS — E785 Hyperlipidemia, unspecified: Secondary | ICD-10-CM | POA: Diagnosis not present

## 2023-10-24 DIAGNOSIS — F419 Anxiety disorder, unspecified: Secondary | ICD-10-CM | POA: Diagnosis not present

## 2023-10-24 DIAGNOSIS — E119 Type 2 diabetes mellitus without complications: Secondary | ICD-10-CM | POA: Diagnosis not present

## 2023-10-24 IMAGING — DX DG ABDOMEN 1V
1 series · 1 of 1 positions shown · non-contrast
Comparison: KUB 12/31/2021; CT renal stone 12/22/2021 and CT
abdomen pelvis 03/22/2019

CLINICAL DATA: Diarrhea and bloating. Lower abdominal pain for 2
weeks. History of diverticulitis. Evaluate for constipation.

EXAM:
ABDOMEN - 1 VIEW

[abdomen kub]
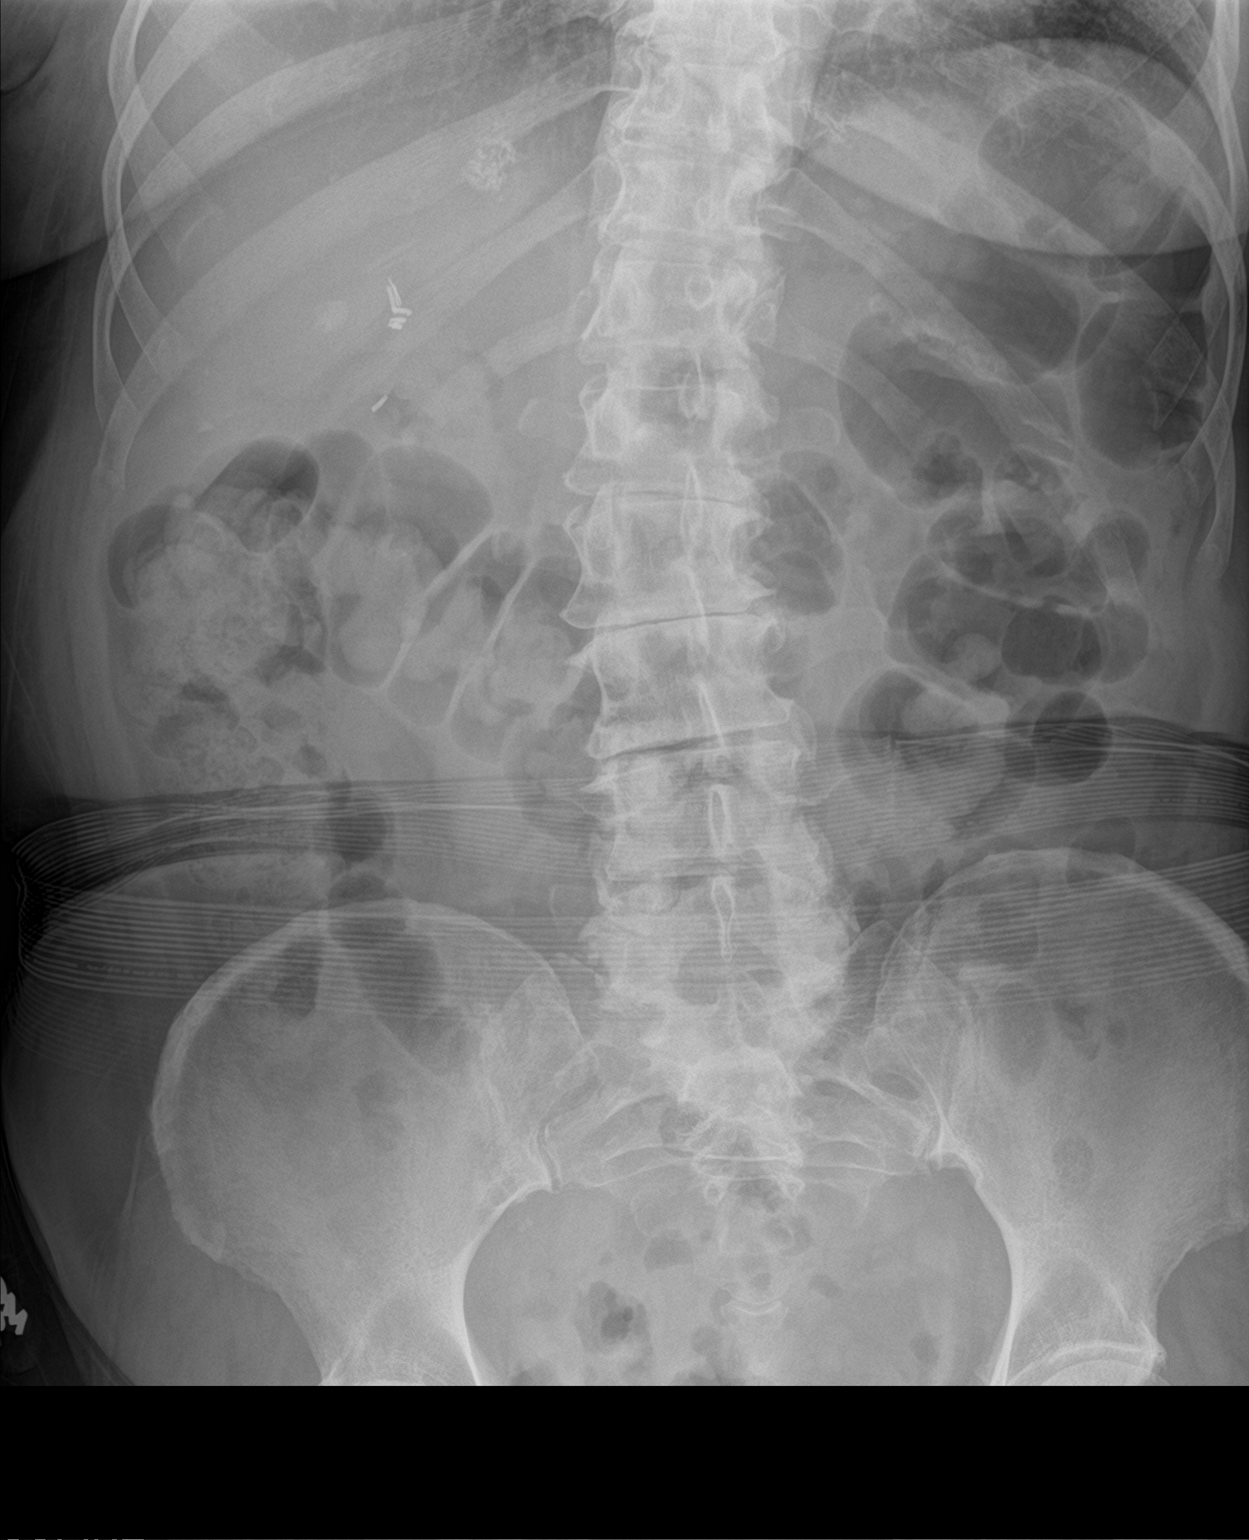

[1 of 1 positions shown; findings below may reference images not displayed]

FINDINGS: Likely cholecystectomy clips. Additional oval focus of
calcifications overlying the liver is localized to the
posteroinferior right lung on 12/22/2021 CT and appears to represent
chronic calcified scarring unchanged from 03/22/2019 CT.

Moderate stool within the ascending and transverse colon.
Nonobstructed bowel-gas pattern. Mild dextrocurvature centered at
L1-2 with severe left L1-2 and L2-3 and severe right L3-4 disc space
narrowing.
IMPRESSION: Mild-to-moderate stool throughout the colon. No evidence of bowel
obstruction.

## 2023-10-29 DIAGNOSIS — R399 Unspecified symptoms and signs involving the genitourinary system: Secondary | ICD-10-CM | POA: Diagnosis not present

## 2023-10-29 DIAGNOSIS — N390 Urinary tract infection, site not specified: Secondary | ICD-10-CM | POA: Diagnosis not present

## 2023-11-02 ENCOUNTER — Telehealth: Payer: Self-pay | Admitting: Physician Assistant

## 2023-11-02 NOTE — Telephone Encounter (Signed)
Inbound call from patient, states she is having constant diarrhea, she also state she is having some rectal bleeding, she states she wanted to be seen before her scheduled appointment in January with Dr. Lavon Paganini. She states she is worried about the symptoms she is having.

## 2023-11-02 NOTE — Telephone Encounter (Signed)
Spoke with the patient. She first told me she has had diarrhea for 2 days. She reports she has 2 watery stools a day. She said she stopped taking Miralax 2 weeks ago because she was moving her bowels without problems. She then told me she told a dose of Cephalexin 500 mg and developed diarrhea, so she did not take any more antibiotic. She then tells me she has had diarrhea twice a day for 2 weeks. Her back hurts into her "left lower stomach." "I have a GYN appointment tomorrow because it could be my ovary." She describes the pain as wrapping from her back through her groin into her vagina.   She is having 2 diarrhea stools a day sometimes with incontinence. Stool is foul smelling. She has weight loss. She is not on antibiotics. She took one dose of antibiotic given to her on 10/29/23 at an urgent care.

## 2023-11-03 ENCOUNTER — Encounter: Payer: Self-pay | Admitting: Nurse Practitioner

## 2023-11-03 ENCOUNTER — Ambulatory Visit: Payer: Medicare Other | Admitting: Nurse Practitioner

## 2023-11-03 VITALS — BP 152/72 | HR 86 | Wt 111.0 lb

## 2023-11-03 DIAGNOSIS — R350 Frequency of micturition: Secondary | ICD-10-CM | POA: Diagnosis not present

## 2023-11-03 DIAGNOSIS — R197 Diarrhea, unspecified: Secondary | ICD-10-CM | POA: Diagnosis not present

## 2023-11-03 DIAGNOSIS — M5441 Lumbago with sciatica, right side: Secondary | ICD-10-CM | POA: Diagnosis not present

## 2023-11-03 DIAGNOSIS — M5442 Lumbago with sciatica, left side: Secondary | ICD-10-CM | POA: Diagnosis not present

## 2023-11-03 DIAGNOSIS — G8929 Other chronic pain: Secondary | ICD-10-CM

## 2023-11-03 LAB — URINALYSIS, COMPLETE W/RFL CULTURE
Bacteria, UA: NONE SEEN /[HPF]
Bilirubin Urine: NEGATIVE
Glucose, UA: NEGATIVE
Hgb urine dipstick: NEGATIVE
Hyaline Cast: NONE SEEN /[LPF]
Leukocyte Esterase: NEGATIVE
Nitrites, Initial: NEGATIVE
RBC / HPF: NONE SEEN /[HPF] (ref 0–2)
Specific Gravity, Urine: >1.03 (ref 1.001–1.035)
pH: 5.5 (ref 5.0–8.0)

## 2023-11-03 NOTE — Progress Notes (Signed)
   Acute Office Visit  Subjective:    Patient ID: Kristen Mahoney, female    DOB: 1946/05/16, 77 y.o.   MRN: [id number]   HPI 77 y.o. presents today for abdominal pain x 3 months. Pain is lower abdomen, intermittent. Complains of diarrhea most days despite what she eats. Has lost weight. Started Ensure daily, trying to increase protein intake. Denies nausea, vomiting, bloating, reflux. Has seen PCP and GI for this. Went to urgent care 11/2, neg UA, prescribed Cephalexin, took one dose and had increased diarrhea so she stopped. Lower back pain is not new, being treated with injections for sciatica. Injection scheduled next month. Plans to call today to see if they can provide something for relief until then. Going out of town and worried about pain. Some urinary frequency without dysuria, urgency, or hematuria. S/P 1986 [facility] for fibroids. H/O diverticulitis. Called GI office yesterday but has not heard back.   No LMP recorded. Patient has had a hysterectomy.    Review of Systems  Constitutional:  Positive for unexpected weight change.  Gastrointestinal:  Positive for abdominal pain and diarrhea. Negative for abdominal distention, blood in stool, constipation, nausea and vomiting.  Genitourinary:  Positive for frequency. Negative for difficulty urinating, dysuria, flank pain, hematuria, urgency, vaginal bleeding, vaginal discharge and vaginal pain.  Musculoskeletal:  Positive for back pain.       Objective:    Physical Exam Constitutional:      Appearance: She is well-developed.  Abdominal:     Tenderness: There is no abdominal tenderness. There is no right CVA tenderness, left CVA tenderness, guarding or rebound.  Musculoskeletal:     Comments: + pain with digital pressure to sciatic nerve bilaterally   GU: Not indicated  BP (!) 152/72   Pulse 86   Wt 111 lb (50.3 kg)   SpO2 98%   BMI 20.97 kg/m  Wt Readings from Last 3 Encounters:  11/03/23 111 lb (50.3 kg)  09/21/23 118 lb (53.5  kg)  09/02/23 121 lb (54.9 kg)        Patient informed chaperone available to be present for breast and/or pelvic exam. Patient has requested no chaperone to be present. Patient has been advised what will be completed during breast and pelvic exam.   UA: neg leukocytes, neg nitrites, 2+ protein, neg blood, dark yellow/slightly cloudy. Microscopic: wbc 0-5, no rbcs, no bacteria   Assessment & Plan:   Problem List Items Addressed This Visit   None Visit Diagnoses     Urinary frequency    -  Primary   Relevant Orders   Urinalysis,Complete w/RFL Culture   Diarrhea, unspecified type       Chronic bilateral low back pain with bilateral sciatica          Plan: Negative UA. Pain appears GI and musculoskeletal. Will follow up with GI for diarrhea and abdominal pain. Will call ortho for episodic relief of sciatic pain since injection not due until next month and going out of town. Did discuss option for pelvic ultrasound for lower abdominal pain if negative GI workup.   Return if symptoms worsen or fail to improve.    Olivia Mackie DNP, 12:34 PM 11/03/2023

## 2023-11-07 DIAGNOSIS — H2512 Age-related nuclear cataract, left eye: Secondary | ICD-10-CM | POA: Diagnosis not present

## 2023-11-15 NOTE — Telephone Encounter (Signed)
Spoke with the patient. She will come in for evaluation Friday 11/18/23 with Dr Lavon Paganini. Wilmon Arms at 10:00 am.

## 2023-11-15 NOTE — Telephone Encounter (Signed)
Inbound call from patient, following up on request below. Patient also states she would like something to increase her appetite. Patient states she continues to have weight loss and would like medication to help. She states she is going on a trip next week and would like medication prescribed prior to.

## 2023-11-18 ENCOUNTER — Ambulatory Visit: Payer: Medicare Other | Admitting: Gastroenterology

## 2023-11-18 ENCOUNTER — Encounter: Payer: Self-pay | Admitting: Gastroenterology

## 2023-11-18 VITALS — BP 160/80 | HR 91 | Ht 62.0 in | Wt 110.0 lb

## 2023-11-18 DIAGNOSIS — K580 Irritable bowel syndrome with diarrhea: Secondary | ICD-10-CM

## 2023-11-18 DIAGNOSIS — R110 Nausea: Secondary | ICD-10-CM | POA: Diagnosis not present

## 2023-11-18 DIAGNOSIS — R159 Full incontinence of feces: Secondary | ICD-10-CM

## 2023-11-18 DIAGNOSIS — E43 Unspecified severe protein-calorie malnutrition: Secondary | ICD-10-CM

## 2023-11-18 DIAGNOSIS — K582 Mixed irritable bowel syndrome: Secondary | ICD-10-CM

## 2023-11-18 DIAGNOSIS — K5730 Diverticulosis of large intestine without perforation or abscess without bleeding: Secondary | ICD-10-CM

## 2023-11-18 DIAGNOSIS — R634 Abnormal weight loss: Secondary | ICD-10-CM

## 2023-11-18 MED ORDER — ONDANSETRON 4 MG PO TBDP: 4.0000 mg | ORAL_TABLET | Freq: Three times a day (TID) | ORAL | 1 refills | Status: DC | PRN

## 2023-11-18 NOTE — Progress Notes (Signed)
Kristen Mahoney    [id number]    [birth year]  Primary Care Physician:Wharton, Rulon Eisenmenger  Referring Physician: Jarrett Soho, PA-C [address] [place],  [place] [address]   Chief complaint: IBS with constipation and diarrhea  Discussed the use of AI scribe software for clinical note transcription with the patient, who gave verbal consent to proceed.  History of Present Illness   The patient, with a history of kidney stones and diverticulosis, presents with significant weight loss and persistent diarrhea. She reports feeling nauseous and has been eating three meals a day, albeit not full meals, and snacking in between. Her typical diet includes breakfast of grits, egg, and meat or Cheerios with milk, lunch of a sandwich, and varied dinners. She also snacks on corn chips with dip, particularly in the evenings.  The patient recently underwent cataract surgery and has been using prescribed eye drops post-surgery. She also reports having to visit urgent care three times over the summer due to uncontrollable diarrhea, which she believes contributed to her weight loss.  Despite these health concerns, the patient has been active, working as a Materials engineer. She has tried drinking Ensure for additional nutrition, but found that the chocolate flavor did not agree with her. She also reports difficulty sleeping, which she attributes to worries about her health.  The patient's weight loss is visually noticeable, and she expresses dissatisfaction with her current physical state. She has been trying to incorporate more fiber into her diet to manage her persistent diarrhea, including eating oatmeal for breakfast. She also reports a preference for boiled eggs over scrambled.  The patient's most recent blood work was done during a hospital visit in August, and she expresses a desire for a thorough check-up in the hospital. She is also considering increasing her intake of  protein drinks and yogurt for additional nutrition.      Colonoscopy 07/06/2022 - Decreased sphincter tone found on digital rectal exam. - One 2 mm polyp in the cecum, removed with a cold snare. Resected and retrieved. - Severe diverticulosis in the sigmoid colon. There was narrowing of the colon in association with the diverticular opening. There was evidence of diverticular spasm. There was no evidence of diverticular bleeding.  CT abd & pelvis 08/12/23 Narrative & Impression     Slight wall thickening with some vascular engorgement along the transverse colon. Please correlate for any clinical evidence of colitis. Normal appendix. No bowel obstruction. Left-sided colonic diverticula.   Fatty liver infiltration.  Previous cholecystectomy.   Nonobstructing right-sided renal stones.   Small low-attenuation lesion along the left kidney is not clearly a simple cyst. Recommend follow up imaging in 6 months.   Outpatient Encounter Medications as of 11/18/2023  Medication Sig   ACCU-CHEK GUIDE test strip USE TO CHECK BLOOD SUGAR TWO TO THREE TIMES A WEEK AS DIRECTED   acetaminophen (TYLENOL) 500 MG tablet Take 1,000 mg by mouth every 6 (six) hours as needed (for pain.).   amoxicillin-clavulanate (AUGMENTIN) 875-125 MG tablet Take 1 tablet by mouth every 12 (twelve) hours.   aspirin EC 81 MG tablet Take 81 mg by mouth every evening.    atorvastatin (LIPITOR) 20 MG tablet Take 20 mg by mouth daily.    betamethasone valerate ointment (VALISONE) 0.1 % Use a pea sized amount topically BID for up to one week as needed.   buPROPion (WELLBUTRIN XL) 150 MG 24 hr tablet Take 150 mg by mouth daily.  Diaphragm Arc-Spring (CAYA) DPRH Place 1 Device vaginally as needed.   diclofenac Sodium (VOLTAREN) 1 % GEL Apply 2 g topically daily as needed (pain).   fluticasone (FLONASE) 50 MCG/ACT nasal spray Place 1 spray into both nostrils daily as needed for allergies.   gabapentin (NEURONTIN) 100 MG capsule Take  1 capsule (100 mg total) by mouth at bedtime.   hydrocortisone 1 % ointment Apply 1 application topically 3 (three) times daily.   hyoscyamine (LEVSIN SL) 0.125 MG SL tablet DISSOLVE 1 TABLET IN MOUTH TWICE DAILY AS NEEDED   ibuprofen (ADVIL) 400 MG tablet Take 1 tablet (400 mg total) by mouth every 6 (six) hours as needed for moderate pain or mild pain.   lactobacillus acidophilus (BACID) TABS tablet Take 2 tablets by mouth 3 (three) times daily.   lidocaine (LIDODERM) 5 % Place 1 patch onto the skin daily. Remove & Discard patch within 12 hours or as directed by MD   loratadine (CLARITIN) 10 MG tablet Take 10 mg by mouth daily.   losartan (COZAAR) 25 MG tablet Take 25 mg by mouth daily.   metFORMIN (GLUCOPHAGE-XR) 500 MG 24 hr tablet Take 500 mg by mouth daily.    metoprolol succinate (TOPROL XL) 50 MG 24 hr tablet Take 1 tablet (50 mg total) by mouth daily. Take with or immediately following a meal.   Multiple Vitamins-Minerals (ICAPS) CAPS Take 1 capsule by mouth 2 (two) times daily.    omeprazole (PRILOSEC) 20 MG capsule Take 1 capsule (20 mg total) by mouth 2 (two) times daily before a meal.   Polyethyl Glycol-Propyl Glycol (SYSTANE) 0.4-0.3 % SOLN Place 1 drop into both eyes 2 (two) times daily.   RESTASIS 0.05 % ophthalmic emulsion Place 1 drop into both eyes every 12 (twelve) hours.   tamsulosin (FLOMAX) 0.4 MG CAPS capsule Take 1 capsule (0.4 mg total) by mouth daily.   topiramate (TOPAMAX) 25 MG tablet Take 1 tablet (25 mg total) by mouth 2 (two) times daily. For headache prevention   Vitamin D, Cholecalciferol, 400 units TABS Take 400 Units by mouth daily.    vitamin E 180 MG (400 UNITS) capsule Take 400 Units by mouth daily.   [DISCONTINUED] ondansetron (ZOFRAN-ODT) 4 MG disintegrating tablet Take 1 tablet (4 mg total) by mouth every 8 (eight) hours as needed for nausea or vomiting.   [DISCONTINUED] ondansetron (ZOFRAN-ODT) 4 MG disintegrating tablet Take 1 tablet (4 mg total) by  mouth every 8 (eight) hours as needed for nausea or vomiting.   ondansetron (ZOFRAN-ODT) 4 MG disintegrating tablet Take 1 tablet (4 mg total) by mouth every 8 (eight) hours as needed for nausea or vomiting.   Facility-Administered Encounter Medications as of 11/18/2023  Medication   betamethasone acetate-betamethasone sodium phosphate (CELESTONE) injection 3 mg    Allergies as of 11/18/2023 - Review Complete 11/18/2023  Allergen Reaction Noted   Ciprofloxacin Rash 09/04/2009   Morphine and codeine Itching 05/07/2011   Prozac [fluoxetine] Other (See Comments) 02/04/2021   Zoloft [sertraline hcl] Other (See Comments) 02/04/2021   Nortriptyline hcl Other (See Comments) 04/05/2023    Past Medical History:  Diagnosis Date   Allergy    seasonal   Anxiety    Arthritis    knees,all over   Basal cell carcinoma    Benign breast cyst in female    PATIENT HAS HISTORY OF BREAST CYSTS   Cataract    beginning bilateral   Diabetes mellitus    GERD (gastroesophageal reflux disease)  Headache    Herpes progenitalis    Hyperlipidemia    Hypertension    Kidney stone    Kidney stones    OSA on CPAP    Sleep apnea    cpap   Urinary, incontinence, stress female    Vertigo     Past Surgical History:  Procedure Laterality Date   ABDOMINAL HYSTERECTOMY  1986   leiomyomata   Basal cell excised     breast cystectomy     right   CHOLECYSTECTOMY  2003   COLONOSCOPY     CYSTOSCOPY/URETEROSCOPY/HOLMIUM LASER/STENT PLACEMENT Right 01/12/2022   Procedure: CYSTOSCOPY/RETROGRADE/ URETEROSCOPY/HOLMIUM LASER/STENT PLACEMENT;  Surgeon: Noel Christmas, MD;  Location: [facility];  Service: Urology;  Laterality: Right;   ear cystectomy     ESOPHAGOGASTRODUODENOSCOPY     KNEE ARTHROSCOPY     right   LITHOTRIPSY     RECTOVAGINAL FISTULA CLOSURE     WISDOM TOOTH EXTRACTION      Family History  Problem Relation Age of Onset   Diabetes Mother    Diabetes Father    Colon cancer Father    Heart  disease Father    Hypertension Father    Diabetes Sister    Hypertension Sister    Diabetes Brother    Colon polyps Brother    Prostate cancer Brother    Breast cancer Paternal Aunt        Age 39's   Colon polyps Brother    Heart disease Brother    Stroke Brother    Hyperlipidemia Brother    Colon polyps Sister    Diabetes Sister    Hypertension Sister    Heart disease Sister    Esophageal cancer Neg Hx    Rectal cancer Neg Hx    Stomach cancer Neg Hx     Social History   Socioeconomic History   Marital status: Married    Spouse name: Not on file   Number of children: 2   Years of education: Not on file   Highest education level: Not on file  Occupational History   Occupation: Retired    Associate Professor: RETIRED  Tobacco Use   Smoking status: Former   Smokeless tobacco: Never  Advertising account planner   Vaping status: Never Used  Substance and Sexual Activity   Alcohol use: Not Currently    Comment: rare   Drug use: No   Sexual activity: Not Currently    Birth control/protection: Surgical    Comment: Hyst, Hx of TV+ & CT+  Other Topics Concern   Not on file  Social History Narrative   Denies caffeine use.   Social Determinants of Health   Financial Resource Strain: Not on file  Food Insecurity: Not on file  Transportation Needs: Not on file  Physical Activity: Not on file  Stress: Not on file  Social Connections: Not on file  Intimate Partner Violence: Not on file      Review of systems: All other review of systems negative except as mentioned in the HPI.   Physical Exam: Vitals:   11/18/23 0959  BP: (!) 160/80  Pulse: 91   Body mass index is 20.12 kg/m. Gen:      No acute distress HEENT:  sclera anicteric Abd:      soft, non-tender; no palpable masses, no distension Ext:    No edema Neuro: alert and oriented x 3 Psych: normal mood and affect  Data Reviewed:  Reviewed labs, radiology imaging, old records and pertinent past GI  work up  Results    RADIOLOGY Kidney CT: Nephrolithiasis, more prominent on the right side (07/2023) Abdominal CT: Diverticulosis on the left side (07/2023)       Assessment and Plan/Recommendations:     Unintentional Weight Loss IBS with constipation and diarrhea Significant weight loss noted, with patient appearing cachectic. Patient reports eating three meals a day, but not full meals, and snacking in between. Discussed the importance of frequent, calorie-dense meals and snacks to prevent further weight loss. Reviewed recent CT abdomen pelvis, negative for any acute pathology, noted slight wall thickening in transverse colon likely secondary to underdistention.  Patient underwent colonoscopy a year ago with unremarkable transverse colon -Increase meal frequency to every 2 hours. -Add protein drinks (e.g., Ensure, Boost) to diet, one per day. -Consider adding Benefiber to meals to increase caloric intake. -Consider adding more fruits, yogurt, and peanut butter to diet.  Kidney Stones Asymptomatic kidney stones noted on previous imaging. No current symptoms reported. -No intervention needed at this time.  Diverticulosis History of diverticulosis noted. Patient reports frequent diarrhea. -Add Benefiber to meals, 1 tablespoon three times a day, to bulk up stool and potentially improve symptoms.  Nausea Patient reports occasional nausea. -Provide refill for ondansetron as needed for nausea.  Hypertension Blood pressure slightly elevated at visit. -Recheck blood pressure at next visit.  Follow-up Patient to return in one month for follow-up. Plan to check labs at current visit to assess for any abnormalities related to weight loss and to monitor electrolyte levels.      This visit required >40 minutes of patient care (this includes precharting, chart review, review of results, face-to-face time used for counseling as well as treatment plan and follow-up. The patient was provided an opportunity to ask  questions and all were answered. The patient agreed with the plan and demonstrated an understanding of the instructions.  Iona Beard , MD    CC: Jarrett Soho, PA-C

## 2023-11-18 NOTE — Patient Instructions (Addendum)
VISIT SUMMARY:  During today's visit, we discussed your significant weight loss and persistent diarrhea. You have been eating three meals a day and snacking, but not full meals. You also mentioned recent cataract surgery and the use of prescribed eye drops. Despite your health concerns, you have remained active. We reviewed your diet and discussed ways to increase your caloric intake. We also addressed your history of kidney stones and diverticulosis, as well as occasional nausea and slightly elevated blood pressure.  YOUR PLAN:  -UNINTENTIONAL WEIGHT LOSS: Unintentional weight loss means losing weight without trying, which can be a sign of an underlying health issue. To help prevent further weight loss, you should eat small, calorie-dense meals every 2 hours, add one protein drink (like Ensure or Boost) to your diet each day, and consider adding Benefiber to your meals. Including more fruits, yogurt, and peanut butter in your diet can also help increase your calorie intake.  -KIDNEY STONES: Kidney stones are hard deposits made of minerals and salts that form inside your kidneys. You currently have asymptomatic kidney stones, which means they are not causing any symptoms. No treatment is needed at this time.  -DIVERTICULOSIS: Diverticulosis is a condition where small pouches form in the walls of your digestive tract. To help manage your frequent diarrhea, you should add Benefiber to your meals, taking 1 tablespoon three times a day.  -NAUSEA: Nausea is a feeling of sickness with an inclination to vomit. You will receive a refill for ondansetron, which you can take as needed to help manage your nausea.  -HYPERTENSION: Hypertension means high blood pressure. Your blood pressure was slightly elevated today, so we will recheck it at your next visit.  INSTRUCTIONS:  Please return in one month for a follow-up visit. We will check your labs at this visit to look for any abnormalities related to your  weight loss and to monitor your electrolyte levels.  A high fiber diet with plenty of fluids (up to 8 glasses of water daily) is suggested to relieve these symptoms. Benefiber  1-2  tablespoon three times daily can be used to keep bowels regular if needed.  We have sent the following medications to your pharmacy for you to pick up at your convenience: Zofran   Keep follow-up appointment on 01/26/24 at 10:40 am with Dr.Nandigam   Thank you for choosing me and [facility].  Dr. Marsa Aris

## 2023-11-29 ENCOUNTER — Encounter: Payer: Self-pay | Admitting: Gastroenterology

## 2023-12-01 DIAGNOSIS — M5416 Radiculopathy, lumbar region: Secondary | ICD-10-CM | POA: Diagnosis not present

## 2024-01-02 ENCOUNTER — Other Ambulatory Visit: Payer: Self-pay | Admitting: Gastroenterology

## 2024-01-03 ENCOUNTER — Telehealth: Payer: Self-pay | Admitting: *Deleted

## 2024-01-03 NOTE — Telephone Encounter (Signed)
 Last MMG on file at [facility] 08/12/22  Call returned to patient, left detailed message, ok per dpr. Advised as seen above. Advised no order needed to schedule screening MMG at [facility]. If you are experiencing any breast issues, we will need to schedule OV first with Tiffany and then assist with scheduling imaging. Return call to office to provide update.

## 2024-01-03 NOTE — Telephone Encounter (Signed)
-----   Message from Braswell L sent at 01/03/2024  3:16 PM EST ----- Regarding: Referral for Mammogram The pt came into the office requesting to speak to Tiffany for a referral for a mammogram. She said her cardiologist suggested that she get one. She said she would like a call back at [phone] from the nurse or Tiffany. She wants it sent to [facility] imaging if possible.   Thank you, Alan

## 2024-01-09 ENCOUNTER — Encounter: Payer: Self-pay | Admitting: Nurse Practitioner

## 2024-01-09 ENCOUNTER — Telehealth: Payer: Self-pay | Admitting: *Deleted

## 2024-01-09 ENCOUNTER — Ambulatory Visit (INDEPENDENT_AMBULATORY_CARE_PROVIDER_SITE_OTHER): Payer: Medicare PPO | Admitting: Nurse Practitioner

## 2024-01-09 VITALS — BP 134/64 | HR 88 | Wt 111.0 lb

## 2024-01-09 DIAGNOSIS — N644 Mastodynia: Secondary | ICD-10-CM | POA: Diagnosis not present

## 2024-01-09 NOTE — Telephone Encounter (Signed)
-----   Message from Olivia Mackie sent at 01/09/2024  1:52 PM EST ----- Regarding: Diagnostic imaging Please send referral for diagnostic imaging of both breast for pain.

## 2024-01-09 NOTE — Progress Notes (Signed)
   Acute Office Visit  Subjective:    Patient ID: Kristen Mahoney, female    DOB: 09-22-1946, 78 y.o.   MRN: [id number]   HPI 78 y.o. presents today for bilateral breast pain for about a month. Describes pain as sore, tender to touch, mid breast. Denies lump, redness, swelling, skin changes or nipple discharge. Most recent mammogram around August 2024 per patient. We do not have most recent report. H/O breast cysts. Has lost weight and bras are fitting too big.  No LMP recorded. Patient has had a hysterectomy.    Review of Systems  Constitutional:  Positive for unexpected weight change.  Hematological:  Negative for adenopathy.  Right breast: Positive for pain. Negative for mass, redness, swelling, nipple discharge, skin changes.  Left breast: Positive for pain. Negative for mass, redness, swelling, nipple discharge, skin changes.      Objective:    Physical Exam Constitutional:      Appearance: Normal appearance.  Chest:  Breasts:    Right: Tenderness present. No swelling, bleeding, inverted nipple, mass, nipple discharge or skin change.     Left: Tenderness present. No swelling, bleeding, inverted nipple, mass, nipple discharge or skin change.    Lymphadenopathy:     Upper Body:     Right upper body: No supraclavicular or axillary adenopathy.     Left upper body: No supraclavicular or axillary adenopathy.     BP 134/64 (BP Location: Right Arm, Patient Position: Sitting, Cuff Size: Normal)   Pulse 88   Wt 111 lb (50.3 kg)   SpO2 98%   BMI 20.30 kg/m  Wt Readings from Last 3 Encounters:  01/09/24 111 lb (50.3 kg)  11/18/23 110 lb (49.9 kg)  11/03/23 111 lb (50.3 kg)        Patient informed chaperone available to be present for breast and/or pelvic exam. Patient has requested no chaperone to be present. Patient has been advised what will be completed during breast and pelvic exam.   Assessment & Plan:   Problem List Items Addressed This Visit   None Visit  Diagnoses       Breast pain    -  Primary      Plan: Tenderness with exam. Will request most recent mammogram and send referral for diagnostic imaging. Needs supportive bra - recommend bra fitting due to significant weight loss.      Annabella DELENA Shutter DNP, 1:53 PM 01/09/2024

## 2024-01-09 NOTE — Telephone Encounter (Signed)
 See 01/09/24 OV.   Encounter closed.

## 2024-01-10 NOTE — Telephone Encounter (Signed)
 Spoke with Kristen Mahoney at [facility]. Kristen Mahoney is scheduled for bilateral Dx MMG and US  if needed on 01/17/24 at 1000.    Spoke with Kristen Mahoney, advised of appt as seen above. Kristen Mahoney is agreeable to date and time.   Order completed and printed, to Tiffany to sign for faxing.   Routing to provider for final review. Kristen Mahoney is agreeable to disposition. Will close encounter.

## 2024-01-11 ENCOUNTER — Encounter: Payer: Self-pay | Admitting: Nurse Practitioner

## 2024-01-18 ENCOUNTER — Encounter: Payer: Self-pay | Admitting: Nurse Practitioner

## 2024-01-23 IMAGING — DX DG THORACIC SPINE 2V
2 series · 2 of 2 positions shown · non-contrast
Comparison: CT urogram January 27, 2022.

CLINICAL DATA: lumbar spine pain; back pain

EXAM:
LUMBAR SPINE - 2-3 VIEW; THORACIC SPINE 2 VIEWS

[t-spine ap]
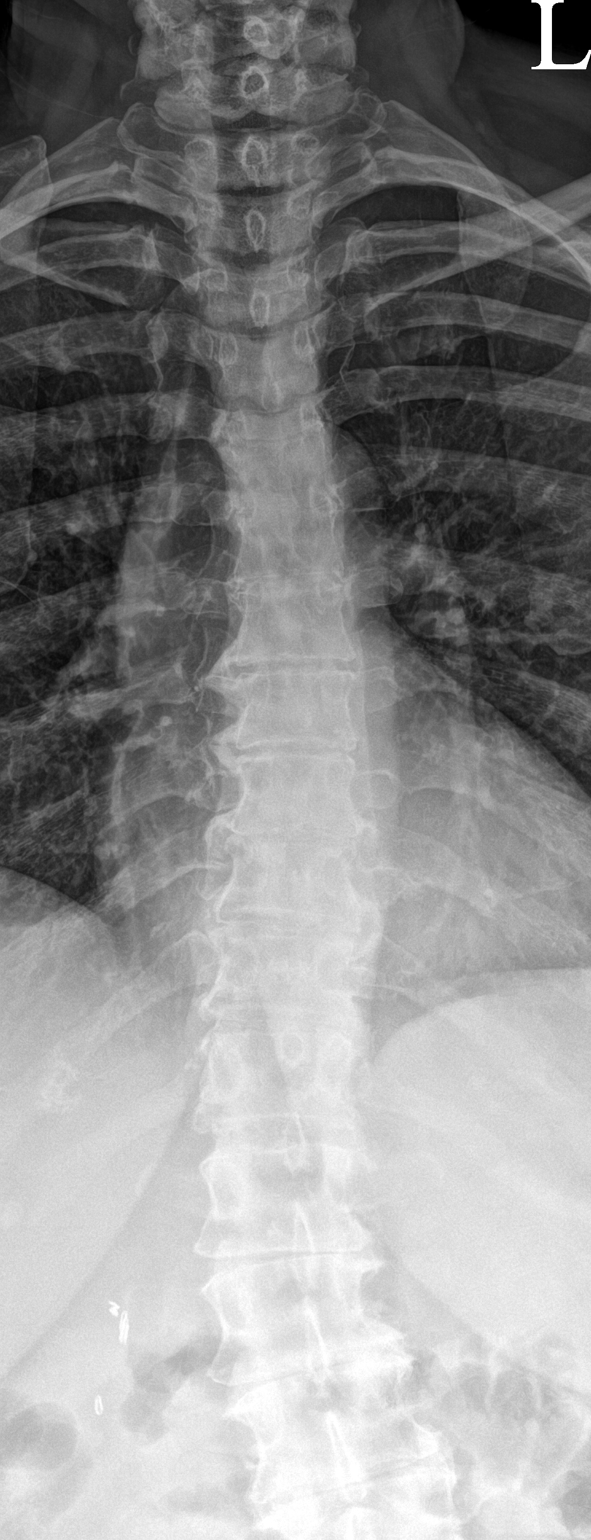

[t-spine lat]
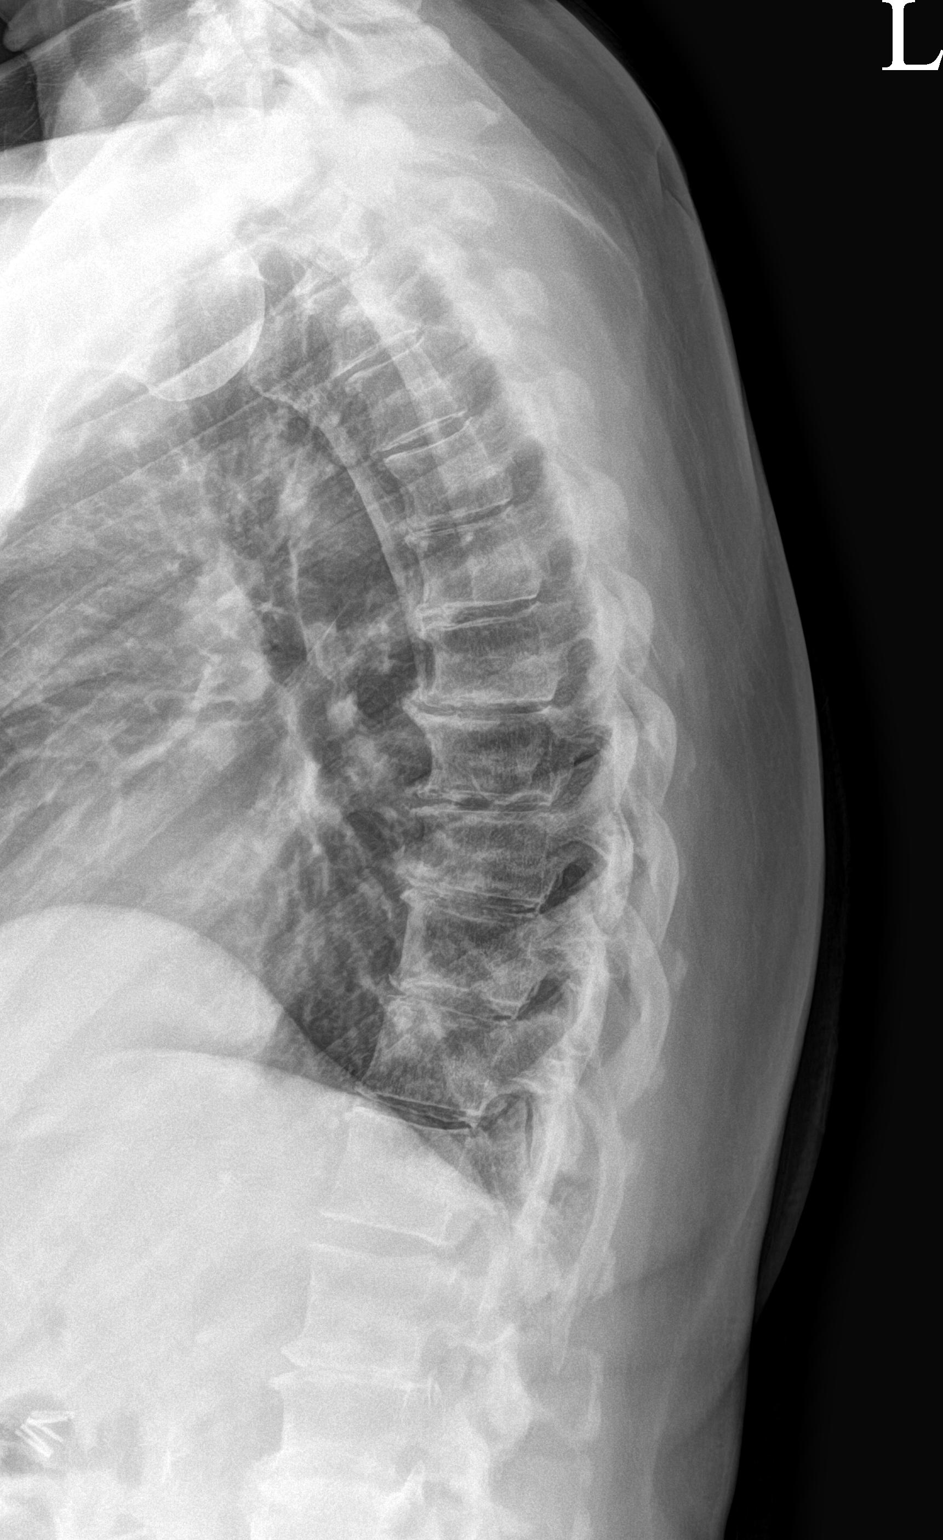

[2 of 2 positions shown; findings below may reference images not displayed]

FINDINGS: Reverse S-shaped thoracolumbar curvature. No radiographic evidence
of acute fracture. Slight stepwise retrolisthesis of L1 on L2, L2 on
L3, and L3 on L4, probably degenerative. Moderate thoracic and
severe lumbar multilevel degenerative change including disc height
loss, endplate sclerosis, osteophytes, and facet arthropathy. Right
upper quadrant abdominal clips.
IMPRESSION: 1. No radiographic evidence of acute fracture or traumatic
malalignment. Cross-sectional imaging could provide more sensitive
evaluation.
2. Moderate thoracic and severe lumbar multilevel degenerative
change.
3. Reverse S-shaped thoracolumbar curvature.

## 2024-01-23 IMAGING — DX DG LUMBAR SPINE 2-3V
3 series · 3 of 3 positions shown · non-contrast
Comparison: CT urogram January 27, 2022.

CLINICAL DATA: lumbar spine pain; back pain

EXAM:
LUMBAR SPINE - 2-3 VIEW; THORACIC SPINE 2 VIEWS

[l-spine ap]
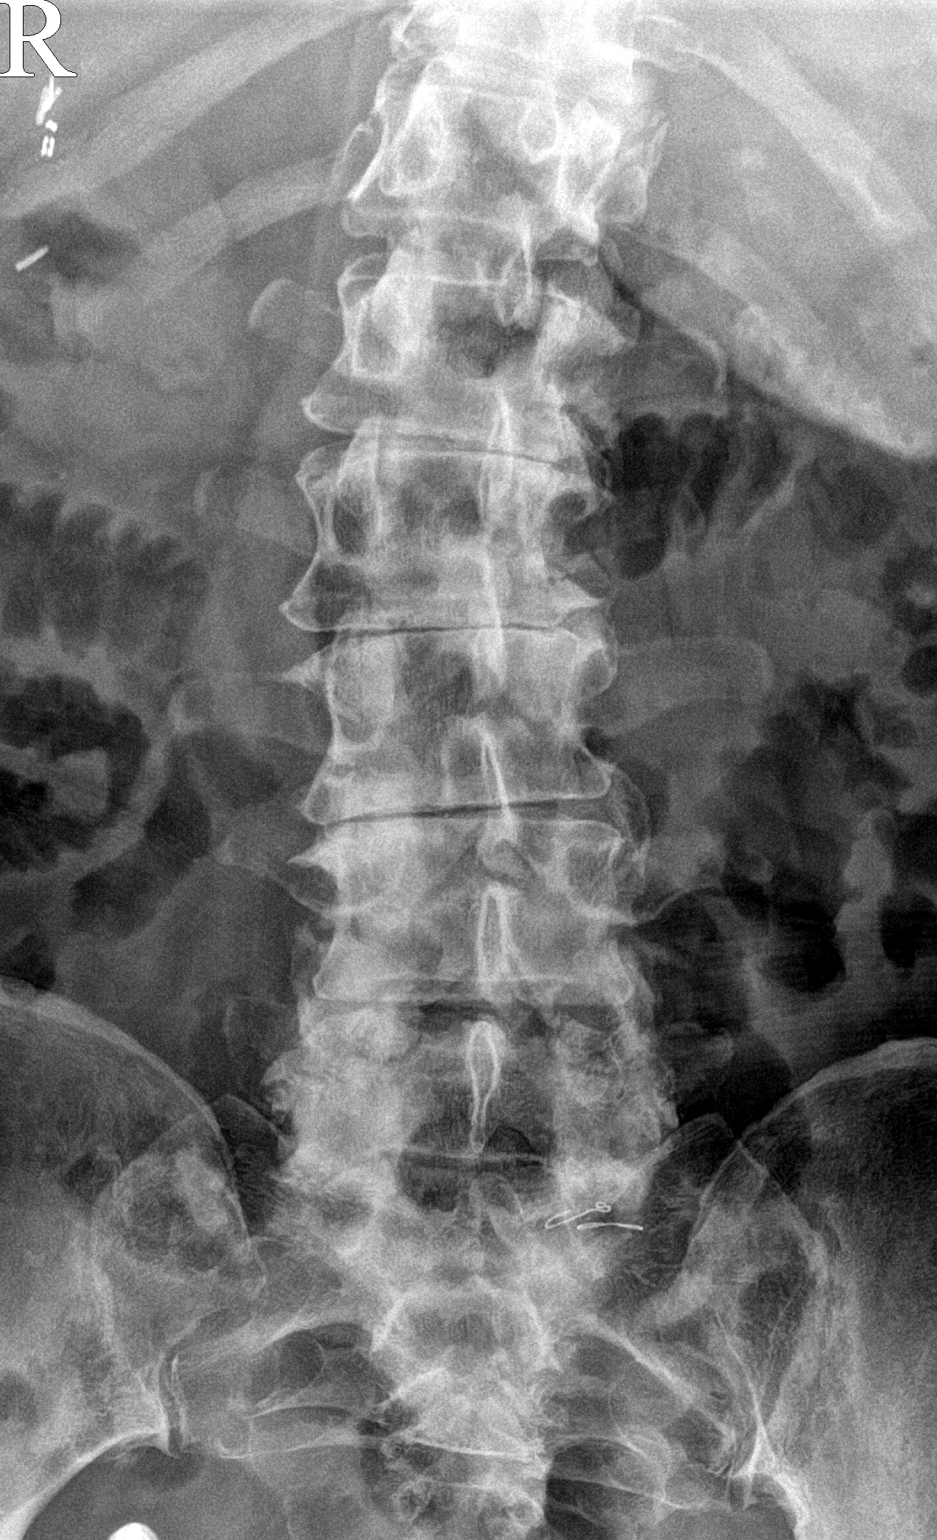

[l-spine lateral]
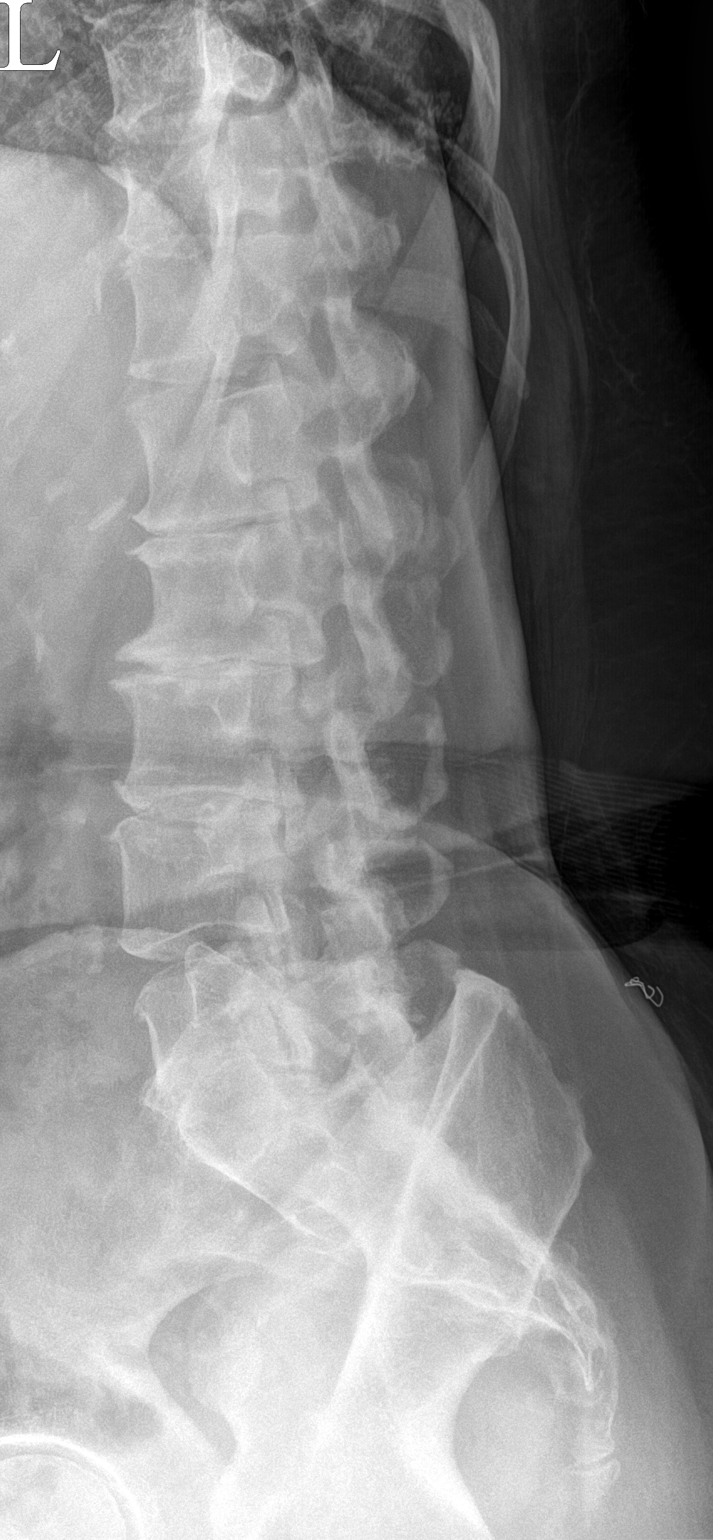

[l-spine spot]
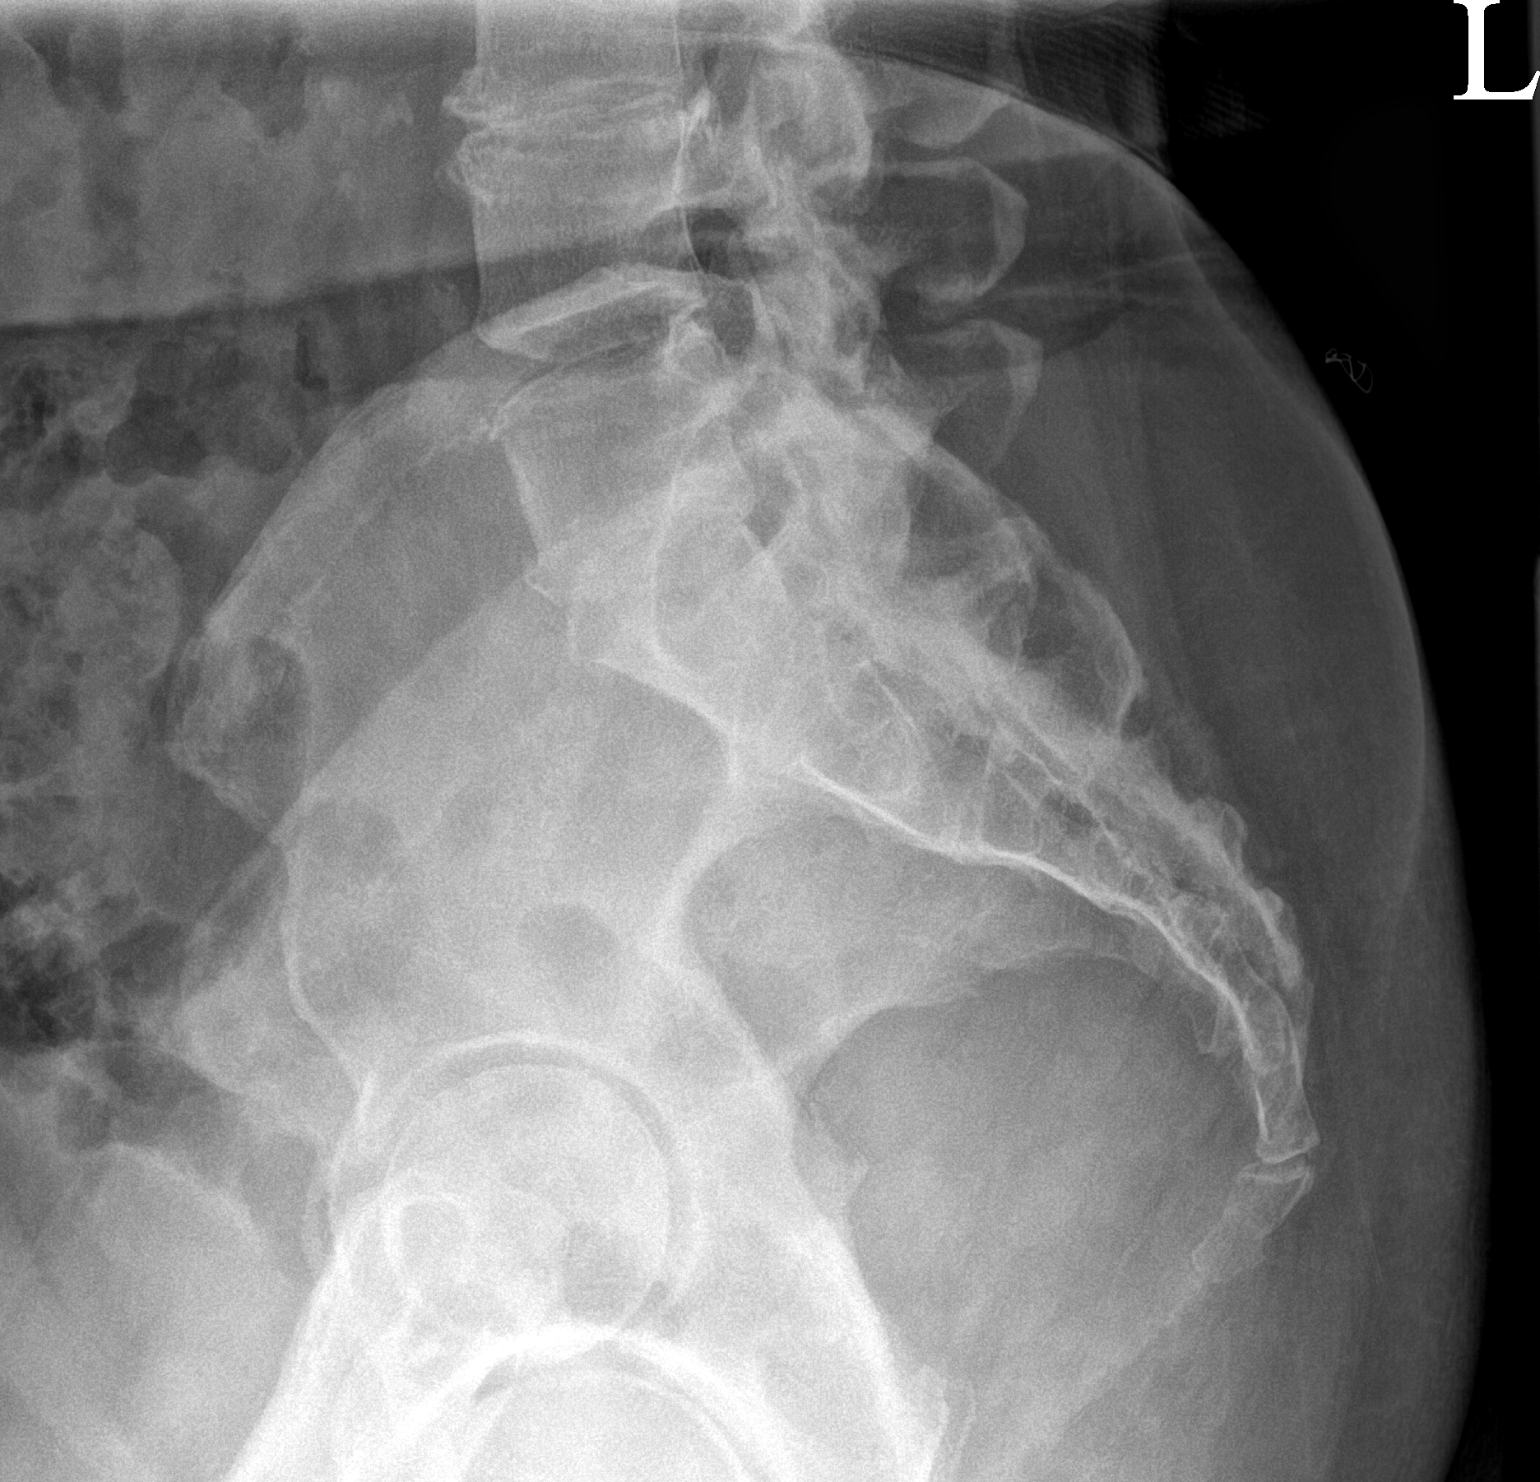

[3 of 3 positions shown; findings below may reference images not displayed]

FINDINGS: Reverse S-shaped thoracolumbar curvature. No radiographic evidence
of acute fracture. Slight stepwise retrolisthesis of L1 on L2, L2 on
L3, and L3 on L4, probably degenerative. Moderate thoracic and
severe lumbar multilevel degenerative change including disc height
loss, endplate sclerosis, osteophytes, and facet arthropathy. Right
upper quadrant abdominal clips.
IMPRESSION: 1. No radiographic evidence of acute fracture or traumatic
malalignment. Cross-sectional imaging could provide more sensitive
evaluation.
2. Moderate thoracic and severe lumbar multilevel degenerative
change.
3. Reverse S-shaped thoracolumbar curvature.

## 2024-01-26 ENCOUNTER — Ambulatory Visit: Payer: Medicare PPO | Admitting: Gastroenterology

## 2024-02-07 ENCOUNTER — Telehealth: Payer: Self-pay | Admitting: Gastroenterology

## 2024-02-07 NOTE — Telephone Encounter (Signed)
Inbound call from patient stating that she is scheduled to see Quentin Mulling on 3/10 at 10:40. Patient states she is having abdominal pain and is losing weight and is requesting a call to speak with nurse to see if she can get X-rays to see what is going on. Please advise.

## 2024-02-08 ENCOUNTER — Other Ambulatory Visit: Payer: Self-pay

## 2024-02-08 MED ORDER — OMEPRAZOLE 20 MG PO CPDR: 20.0000 mg | DELAYED_RELEASE_CAPSULE | Freq: Two times a day (BID) | ORAL | 3 refills | Status: DC

## 2024-02-08 NOTE — Telephone Encounter (Signed)
Patient reports continued weight loss. She weighs about 106 lbs. She did not tolerate Ensure because it made her feel sick. No other supplemental shakes tried. States "I am eating and eating snacks too."  She does not feel nauseous at other times and is not taking Zofran.  Complains of "growling, cramping pain in my abdomen."  She has been out of omeprazole for an unknown amount of time. I will refill this. Denies any changes in her bowel movements.  The patient asks if there are any tests that can be done to "help find what my problem is?"

## 2024-02-08 NOTE — Telephone Encounter (Signed)
No known obvious etiology or acute pathology on CT scan.  Agree with keeping her follow-up appointment with Marchelle Folks to evaluate and order appropriate testing as needed

## 2024-03-05 ENCOUNTER — Ambulatory Visit: Payer: Medicare PPO | Admitting: Physician Assistant

## 2024-03-16 ENCOUNTER — Other Ambulatory Visit: Payer: Self-pay | Admitting: Family Medicine

## 2024-03-16 ENCOUNTER — Ambulatory Visit
Admission: RE | Admit: 2024-03-16 | Discharge: 2024-03-16 | Disposition: A | Discharge status: Home / Self Care | Source: Ambulatory Visit | Attending: Family Medicine | Admitting: Family Medicine

## 2024-03-16 DIAGNOSIS — R634 Abnormal weight loss: Secondary | ICD-10-CM

## 2024-04-23 ENCOUNTER — Other Ambulatory Visit: Payer: Self-pay | Admitting: Adult Health

## 2024-04-23 NOTE — Telephone Encounter (Signed)
 Copied from CRM [id number]. Topic: Clinical - Medication Refill >> Apr 23, 2024 11:31 AM Bambi Bonine D wrote: Most Recent Primary Care Visit:   Medication: topiramate  (TOPAMAX ) 25 MG tablet  Has the patient contacted their pharmacy? Yes (Agent: If no, request that the patient contact the pharmacy for the refill. If patient does not wish to contact the pharmacy document the reason why and proceed with request.) (Agent: If yes, when and what did the pharmacy advise?)  Is this the correct pharmacy for this prescription? Yes If no, delete pharmacy and type the correct one.  This is the patient's preferred pharmacy:  [facility] [address] [place], [place] - [address] [place] [address] Phone: [phone] Fax: [phone]   Has the prescription been filled recently? No  Is the patient out of the medication? No  Has the patient been seen for an appointment in the last year OR does the patient have an upcoming appointment? Yes  Can we respond through MyChart? No  Agent: Please be advised that Rx refills may take up to 3 business days. We ask that you follow-up with your pharmacy.

## 2024-04-24 NOTE — Telephone Encounter (Signed)
 Okay for refill?

## 2024-05-02 ENCOUNTER — Encounter: Admitting: Family Medicine
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
## 2024-05-04 ENCOUNTER — Encounter: Payer: Self-pay | Admitting: Physician Assistant

## 2024-05-04 ENCOUNTER — Ambulatory Visit (INDEPENDENT_AMBULATORY_CARE_PROVIDER_SITE_OTHER): Admitting: Physician Assistant

## 2024-05-04 VITALS — BP 110/62 | HR 77 | Ht 61.0 in | Wt 104.0 lb

## 2024-05-04 DIAGNOSIS — R197 Diarrhea, unspecified: Secondary | ICD-10-CM

## 2024-05-04 DIAGNOSIS — K582 Mixed irritable bowel syndrome: Secondary | ICD-10-CM | POA: Diagnosis not present

## 2024-05-04 DIAGNOSIS — R634 Abnormal weight loss: Secondary | ICD-10-CM | POA: Diagnosis not present

## 2024-05-04 NOTE — Patient Instructions (Signed)
 May try Carination breakfast shakes.  We are not concerned about current weight number as long as you feel well and your weight continues to stay stable.

## 2024-05-04 NOTE — Progress Notes (Signed)
 Chief Complaint: IBS symptom's  HPI:    Mrs. Urbin is a 78 year old female with a past medical history as listed below including diverticulosis and kidney stones, known to Dr. Leonia Raman, who presents to clinic today for follow-up of IBS symptoms and weight loss as well as persistent diarrhea.    11/18/2023 patient seen in clinic and that time had significant weight loss and persistent diarrhea.  Also nauseous and been eating 3 meals a day.  At that time discussed frequent calorie dense meals and snacks to prevent further weight loss, recent CTAP was reviewed with no acute pathology, slight wall thickening in the transverse colon was thought due to underdistention, colonoscopy a year prior with unremarkable transverse colon, at that time told to increase meal frequency to every 2 hours and that protein drinks as well as fiber.  Discussed nausea and refilled Ondansetron . Weight was 110.    02/07/2024 patient called in and described continue to lose weight, 106 pounds.  She cannot tolerate the Ensure due to nausea.  Other supplemental shakes did not work either.  At that time complaining of a growling cramping pain in her abdomen.  Out of omeprazole .  This was refilled.    Today, patient presents to clinic and tells me that she feels like her weight has remained stable.  Notes that she seems to however now around 102-104 but has not lost any significant weight over the past 4 months.  She did try the boost/Ensure shakes but could not drink them.  She continues to eat well.  She also continues with chronic IBS symptoms including occasional diarrhea and stomach grumbling, but no change in this over the recent past.  Her kids are concerned in regards to her weight loss.    Denies fever, chills, nausea or vomiting.  Previous GI workup:  Colonoscopy 07/06/2022 - Decreased sphincter tone found on digital rectal exam. - One 2 mm polyp in the cecum, removed with a cold snare. Resected and retrieved. - Severe  diverticulosis in the sigmoid colon. There was narrowing of the colon in association with the diverticular opening. There was evidence of diverticular spasm. There was no evidence of diverticular bleeding.   CT abd & pelvis 08/12/23 Narrative & Impression     Slight wall thickening with some vascular engorgement along the transverse colon. Please correlate for any clinical evidence of colitis. Normal appendix. No bowel obstruction. Left-sided colonic diverticula.   Fatty liver infiltration.  Previous cholecystectomy.   Nonobstructing right-sided renal stones.   Small low-attenuation lesion along the left kidney is not clearly a simple cyst. Recommend follow up imaging in 6 months.    Past Medical History:  Diagnosis Date   Allergy    seasonal   Anxiety    Arthritis    knees,all over   Basal cell carcinoma    Benign breast cyst in female    PATIENT HAS HISTORY OF BREAST CYSTS   Cataract    beginning bilateral   Diabetes mellitus    GERD (gastroesophageal reflux disease)    Headache    Herpes progenitalis    Hyperlipidemia    Hypertension    Kidney stone    Kidney stones    OSA on CPAP    Sleep apnea    cpap   Urinary, incontinence, stress female    Vertigo     Past Surgical History:  Procedure Laterality Date   ABDOMINAL HYSTERECTOMY  1986   leiomyomata   Basal cell excised  breast cystectomy     right   CHOLECYSTECTOMY  2003   COLONOSCOPY     CYSTOSCOPY/URETEROSCOPY/HOLMIUM LASER/STENT PLACEMENT Right 01/12/2022   Procedure: CYSTOSCOPY/RETROGRADE/ URETEROSCOPY/HOLMIUM LASER/STENT PLACEMENT;  Surgeon: Roxane Copp, MD;  Location: [facility];  Service: Urology;  Laterality: Right;   ear cystectomy     ESOPHAGOGASTRODUODENOSCOPY     KNEE ARTHROSCOPY     right   LITHOTRIPSY     RECTOVAGINAL FISTULA CLOSURE     WISDOM TOOTH EXTRACTION      Current Outpatient Medications  Medication Sig Dispense Refill   ACCU-CHEK GUIDE test strip USE TO CHECK BLOOD SUGAR  TWO TO THREE TIMES A WEEK AS DIRECTED     acetaminophen  (TYLENOL ) 500 MG tablet Take 1,000 mg by mouth every 6 (six) hours as needed (for pain.).     amoxicillin -clavulanate (AUGMENTIN ) 875-125 MG tablet Take 1 tablet by mouth every 12 (twelve) hours. 14 tablet 0   aspirin EC 81 MG tablet Take 81 mg by mouth every evening.      atorvastatin (LIPITOR) 20 MG tablet Take 20 mg by mouth daily.      betamethasone  valerate ointment (VALISONE ) 0.1 % Use a pea sized amount topically BID for up to one week as needed. 30 g 0   buPROPion (WELLBUTRIN XL) 150 MG 24 hr tablet Take 150 mg by mouth daily.     Diaphragm Arc-Spring (CAYA) DPR[facility] Place 1 Device vaginally as needed. (Patient not taking: Reported on 01/09/2024) 10 each 1   diclofenac Sodium (VOLTAREN) 1 % GEL Apply 2 g topically daily as needed (pain). (Patient not taking: Reported on 01/09/2024)     fluticasone (FLONASE) 50 MCG/ACT nasal spray Place 1 spray into both nostrils daily as needed for allergies.     gabapentin  (NEURONTIN ) 100 MG capsule Take 1 capsule (100 mg total) by mouth at bedtime. 30 capsule 3   hydrocortisone  1 % ointment Apply 1 application topically 3 (three) times daily. (Patient not taking: Reported on 01/09/2024) 30 g 0   hyoscyamine  (LEVSIN SL) 0.125 MG SL tablet DISSOLVE 1 TABLET IN MOUTH TWICE DAILY AS NEEDED 60 tablet 0   ibuprofen  (ADVIL ) 400 MG tablet Take 1 tablet (400 mg total) by mouth every 6 (six) hours as needed for moderate pain or mild pain. 20 tablet 0   lactobacillus acidophilus (BACID) TABS tablet Take 2 tablets by mouth 3 (three) times daily. (Patient not taking: Reported on 01/09/2024)     lidocaine  (LIDODERM ) 5 % Place 1 patch onto the skin daily. Remove & Discard patch within 12 hours or as directed by MD (Patient not taking: Reported on 01/09/2024) 30 patch 11   loratadine (CLARITIN) 10 MG tablet Take 10 mg by mouth daily.     losartan (COZAAR) 25 MG tablet Take 25 mg by mouth daily.     metFORMIN (GLUCOPHAGE-XR)  500 MG 24 hr tablet Take 500 mg by mouth daily.      metoprolol  succinate (TOPROL  XL) 50 MG 24 hr tablet Take 1 tablet (50 mg total) by mouth daily. Take with or immediately following a meal. 30 tablet 0   Multiple Vitamins-Minerals (ICAPS) CAPS Take 1 capsule by mouth 2 (two) times daily.      omeprazole  (PRILOSEC) 20 MG capsule Take 1 capsule (20 mg total) by mouth 2 (two) times daily before a meal. 180 capsule 3   ondansetron  (ZOFRAN -ODT) 4 MG disintegrating tablet Take 1 tablet (4 mg total) by mouth every 8 (eight) hours as needed for nausea  or vomiting. (Patient not taking: Reported on 01/09/2024) 30 tablet 1   Polyethyl Glycol-Propyl Glycol (SYSTANE) 0.4-0.3 % SOLN Place 1 drop into both eyes 2 (two) times daily.     RESTASIS 0.05 % ophthalmic emulsion Place 1 drop into both eyes every 12 (twelve) hours.     tamsulosin  (FLOMAX ) 0.4 MG CAPS capsule Take 1 capsule (0.4 mg total) by mouth daily. (Patient not taking: Reported on 01/09/2024) 7 capsule 0   topiramate  (TOPAMAX ) 25 MG tablet Take 1 tablet (25 mg total) by mouth 2 (two) times daily. For headache prevention 180 tablet 3   Vitamin D , Cholecalciferol, 400 units TABS Take 400 Units by mouth daily.      vitamin E 180 MG (400 UNITS) capsule Take 400 Units by mouth daily.     Current Facility-Administered Medications  Medication Dose Route Frequency Provider Last Rate Last Admin   betamethasone  acetate-betamethasone  sodium phosphate  (CELESTONE ) injection 3 mg  3 mg Intra-articular Once Evans, Brent M, DPM        Allergies as of 05/04/2024 - Review Complete 01/09/2024  Allergen Reaction Noted   Ciprofloxacin  Rash 09/04/2009   Morphine and codeine Itching 05/07/2011   Prozac [fluoxetine] Other (See Comments) 02/04/2021   Zoloft [sertraline hcl] Other (See Comments) 02/04/2021   Nortriptyline  hcl Other (See Comments) 04/05/2023    Family History  Problem Relation Age of Onset   Diabetes Mother    Diabetes Father    Colon cancer Father     Heart disease Father    Hypertension Father    Diabetes Sister    Hypertension Sister    Diabetes Brother    Colon polyps Brother    Prostate cancer Brother    Breast cancer Paternal Aunt        Age 26's   Colon polyps Brother    Heart disease Brother    Stroke Brother    Hyperlipidemia Brother    Colon polyps Sister    Diabetes Sister    Hypertension Sister    Heart disease Sister    Esophageal cancer Neg Hx    Rectal cancer Neg Hx    Stomach cancer Neg Hx     Social History   Socioeconomic History   Marital status: Married    Spouse name: Not on file   Number of children: 2   Years of education: Not on file   Highest education level: Not on file  Occupational History   Occupation: Retired    Associate Professor: RETIRED  Tobacco Use   Smoking status: Former   Smokeless tobacco: Never  Advertising account planner   Vaping status: Never Used  Substance and Sexual Activity   Alcohol use: Not Currently    Comment: rare   Drug use: No   Sexual activity: Not Currently    Birth control/protection: Surgical    Comment: Hyst, Hx of TV+ & CT+  Other Topics Concern   Not on file  Social History Narrative   Denies caffeine use.   Social Drivers of Corporate investment banker Strain: Not on file  Food Insecurity: Not on file  Transportation Needs: Not on file  Physical Activity: Not on file  Stress: Not on file  Social Connections: Not on file  Intimate Partner Violence: Not on file    Review of Systems:    Constitutional: No fever or chills Cardiovascular: No chest pain Respiratory: No SOB  Gastrointestinal: See HPI and otherwise negative   Physical Exam:  Vital signs: BP 110/62  Pulse 77   Ht 5\' 1"  (1.549 m)   Wt 104 lb (47.2 kg)   BMI 19.65 kg/m    Constitutional:   Pleasant elderly AA female appears to be in NAD, Well developed, Well nourished, alert and cooperative Respiratory: Respirations even and unlabored. Lungs clear to auscultation bilaterally.   No wheezes,  crackles, or rhonchi.  Cardiovascular: Normal S1, S2. No MRG. Regular rate and rhythm. No peripheral edema, cyanosis or pallor.  Gastrointestinal:  Soft, nondistended, nontender. No rebound or guarding. Normal bowel sounds. No appreciable masses or hepatomegaly. Rectal:  Not performed.  Psychiatric: Oriented to person, place and time. Demonstrates good judgement and reason without abnormal affect or behaviors.  RELEVANT LABS AND IMAGING: CBC    Component Value Date/Time   WBC 8.3 08/12/2023 1410   RBC 4.29 08/12/2023 1410   HGB 13.8 08/12/2023 1410   HCT 42.0 08/12/2023 1410   PLT 224 08/12/2023 1410   MCV 97.9 08/12/2023 1410   MCH 32.2 08/12/2023 1410   MCHC 32.9 08/12/2023 1410   RDW 12.8 08/12/2023 1410   LYMPHSABS 1.3 08/12/2023 1410   MONOABS 0.6 08/12/2023 1410   EOSABS 0.1 08/12/2023 1410   BASOSABS 0.1 08/12/2023 1410    CMP     Component Value Date/Time   NA 141 08/12/2023 1410   K 3.8 08/12/2023 1410   CL 110 08/12/2023 1410   CO2 22 08/12/2023 1410   GLUCOSE 118 (H) 08/12/2023 1410   BUN 24 (H) 08/12/2023 1410   CREATININE 0.98 08/12/2023 1410   CALCIUM 9.2 08/12/2023 1410   PROT 7.6 08/12/2023 1410   ALBUMIN 4.4 08/12/2023 1410   AST 25 08/12/2023 1410   ALT 46 (H) 08/12/2023 1410   ALKPHOS 109 08/12/2023 1410   BILITOT 0.5 08/12/2023 1410   GFRNONAA 59 (L) 08/12/2023 1410   GFRAA 31 (L) 09/21/2019 1615    Assessment: 1.  Weight loss: Seems to have lost about 8 pounds since she was seen last in November, but recently tells me she has been hovering around 10 to 104 and has not lost any more over the past couple of months, reviewed CT done in the past as well as colonoscopy with no etiology, patient denies any night sweats or other alarm symptoms 2.  IBS-mixed: Currently times of diarrhea, but this is normal for her  Plan: 1.  Will let Dr. Leonia Raman review further, if she needs further workup could consider an EGD I suppose +/- a colonoscopy though she  recently had one.  Could also perform CT of the chest etc.  Will asked Dr. Nandigam for her guidance. 2.  For now patient will continue to increase calorie intake, suggested Carnation breakfast shakes as an alternative. 3.  Patient  to follow in clinic per recommendations after Dr. Allean Aran review.  Reginal Capra, PA-C [facility] 05/04/2024, 3:00 PM  Cc: Darnelle Elders, PA-C

## 2024-05-08 ENCOUNTER — Telehealth: Payer: Self-pay

## 2024-05-08 NOTE — Telephone Encounter (Signed)
 Left message on machine to call back

## 2024-05-08 NOTE — Telephone Encounter (Signed)
-----   Message from Graciella Lavender sent at 05/07/2024  8:50 AM EDT ----- Regarding: FW: Weight loss Please let patient know that Dr. Leonia Raman would like her to come in for EGD.  If she would like to be seen in clinic to discuss that a little further before scheduling we can certainly fit her in, otherwise could be set up for EGD directly.  Thanks, JL L ----- Message ----- From: Nandigam, Kavitha V, MD Sent: 05/04/2024   4:21 PM EDT To: Graciella Lavender, PA Subject: RE: Weight loss                                Agree with EGD, will likely be low yield but will help us  to be sure that we are not missing something.  She had a recent colonoscopy so I do not think she needs a repeat colonoscopy.  Thank you ----- Message ----- From: Graciella Lavender, PA Sent: 05/04/2024   3:31 PM EDT To: Kavitha Nandigam V, MD Subject: Weight loss                                    Can you please review my most recent note.  Would love your guidance on this, does not seem like she is really lost significant weight over the past couple of months, do we need to have her come in and weigh occasionally, do think there is any benefit of EGD?  Thanks, JL L

## 2024-05-10 NOTE — Telephone Encounter (Signed)
 Spoke with the pt and she tells me that she has a new grand baby and is going out of town for several weeks and prefers to call when she is back in town to set up EGD.

## 2024-05-11 ENCOUNTER — Encounter: Payer: Self-pay | Admitting: Gastroenterology

## 2024-05-11 ENCOUNTER — Other Ambulatory Visit: Payer: Self-pay

## 2024-05-11 DIAGNOSIS — R634 Abnormal weight loss: Secondary | ICD-10-CM

## 2024-05-11 DIAGNOSIS — R110 Nausea: Secondary | ICD-10-CM

## 2024-05-11 NOTE — Telephone Encounter (Signed)
Patient scheduled for EGD

## 2024-05-11 NOTE — Telephone Encounter (Signed)
 Noted.

## 2024-05-11 NOTE — Telephone Encounter (Signed)
 Amb referral has been entered  Instructions have been mailed to the pt

## 2024-05-14 NOTE — Telephone Encounter (Addendum)
 Inbound call from patient requesting a call to discuss instructions for upcoming EGD. States she also recently had dental work and would like to ensure that it will not interfere. Please advise, thank you

## 2024-05-14 NOTE — Telephone Encounter (Signed)
 I was able to speak with the pt over the phone- we discussed the instructions at length.  All questions answered to the best of my ability.   The pt has been advised of the information and verbalized understanding.

## 2024-05-16 ENCOUNTER — Encounter: Payer: Self-pay | Admitting: Gastroenterology

## 2024-05-16 ENCOUNTER — Ambulatory Visit (AMBULATORY_SURGERY_CENTER): Admitting: Gastroenterology

## 2024-05-16 VITALS — BP 124/64 | HR 66 | Temp 97.3°F | Resp 12 | Ht 61.0 in | Wt 104.0 lb

## 2024-05-16 DIAGNOSIS — K229 Disease of esophagus, unspecified: Secondary | ICD-10-CM | POA: Diagnosis not present

## 2024-05-16 DIAGNOSIS — K3189 Other diseases of stomach and duodenum: Secondary | ICD-10-CM

## 2024-05-16 DIAGNOSIS — K31A19 Gastric intestinal metaplasia without dysplasia, unspecified site: Secondary | ICD-10-CM

## 2024-05-16 DIAGNOSIS — K209 Esophagitis, unspecified without bleeding: Secondary | ICD-10-CM

## 2024-05-16 DIAGNOSIS — K449 Diaphragmatic hernia without obstruction or gangrene: Secondary | ICD-10-CM

## 2024-05-16 DIAGNOSIS — R634 Abnormal weight loss: Secondary | ICD-10-CM

## 2024-05-16 DIAGNOSIS — K31A11 Gastric intestinal metaplasia without dysplasia, involving the antrum: Secondary | ICD-10-CM | POA: Diagnosis not present

## 2024-05-16 DIAGNOSIS — K219 Gastro-esophageal reflux disease without esophagitis: Secondary | ICD-10-CM

## 2024-05-16 DIAGNOSIS — K2970 Gastritis, unspecified, without bleeding: Secondary | ICD-10-CM

## 2024-05-16 DIAGNOSIS — R197 Diarrhea, unspecified: Secondary | ICD-10-CM

## 2024-05-16 MED ORDER — SODIUM CHLORIDE 0.9 % IV SOLN: 500.0000 mL | Freq: Once | INTRAVENOUS | Status: DC

## 2024-05-16 MED ORDER — PANTOPRAZOLE SODIUM 40 MG PO TBEC: 40.0000 mg | DELAYED_RELEASE_TABLET | Freq: Two times a day (BID) | ORAL | 3 refills | Status: DC

## 2024-05-16 NOTE — Op Note (Signed)
 [facility] Patient Name: Kristen Mahoney Procedure Date: 05/16/2024 11:50 AM MRN: [id number] Endoscopist: Sergio Dandy , MD, [id number] Age: 78 Referring MD:  Date of Birth: 02-03-1946 Gender: Female Account #: [id number] Procedure:                Upper GI endoscopy Indications:              Epigastric abdominal pain, Endoscopy to assess                            diarrhea in patient suspected of having disease of                            the small-bowel, Failure to thrive, Weight loss Medicines:                Monitored Anesthesia Care Procedure:                Pre-Anesthesia Assessment:                           - Prior to the procedure, a History and Physical                            was performed, and patient medications and                            allergies were reviewed. The patient's tolerance of                            previous anesthesia was also reviewed. The risks                            and benefits of the procedure and the sedation                            options and risks were discussed with the patient.                            All questions were answered, and informed consent                            was obtained. Prior Anticoagulants: The patient has                            taken no anticoagulant or antiplatelet agents. ASA                            Grade Assessment: III - A patient with severe                            systemic disease. After reviewing the risks and                            benefits, the patient was deemed in satisfactory  condition to undergo the procedure.                           After obtaining informed consent, the endoscope was                            passed under direct vision. Throughout the                            procedure, the patient's blood pressure, pulse, and                            oxygen saturations were monitored continuously. The                             GIF [id number] was introduced through the                            mouth, and advanced to the second part of duodenum.                            The upper GI endoscopy was accomplished without                            difficulty. The patient tolerated the procedure                            well. Scope In: Scope Out: Findings:                 LA Grade B (one or more mucosal breaks greater than                            5 mm, not extending between the tops of two mucosal                            folds) esophagitis with no bleeding was found 34 to                            35 cm from the incisors. Biopsies were taken with a                            cold forceps for histology.                           A 3 cm hiatal hernia was present.                           Patchy mild inflammation characterized by                            congestion (edema), erythema and friability was  found in the entire examined stomach. Biopsies were                            taken with a cold forceps for Helicobacter pylori                            testing.                           The cardia and gastric fundus were normal on                            retroflexion.                           The examined duodenum was normal. Complications:            No immediate complications. Estimated Blood Loss:     Estimated blood loss was minimal. Impression:               - LA Grade B reflux esophagitis with no bleeding.                            Biopsied.                           - 3 cm hiatal hernia.                           - Gastritis. Biopsied.                           - Normal examined duodenum. Recommendation:           - Patient has a contact number available for                            emergencies. The signs and symptoms of potential                            delayed complications were discussed with the                            patient. Return to normal  activities tomorrow.                            Written discharge instructions were provided to the                            patient.                           - Resume previous diet.                           - Continue present medications.                           -  Await pathology results.                           - Follow an antireflux regimen.                           - Use Protonix  (pantoprazole ) 40 mg PO BID. Rx for                            90 days with 3 refills                           - DC omeprazole  Sergio Dandy, MD 05/16/2024 12:07:39 PM This report has been signed electronically.

## 2024-05-16 NOTE — Progress Notes (Signed)
 [facility] History and Physical   Primary Care Physician:  Darnelle Elders, PA-C   Reason for Procedure:  Failure to thrive, unintentional weight loss and chronic diarrhea  Plan:    EGD  with possible interventions as needed     HPI: Kristen Mahoney is a very pleasant 78 y.o. female here for EGD for evaluation of failure to thrive, unintentional weight loss and chronic diarrhea. Please refer to office visit note 05/04/24 for additional details  The risks and benefits as well as alternatives of endoscopic procedure(s) have been discussed and reviewed. All questions answered. The patient agrees to proceed.    Past Medical History:  Diagnosis Date   Allergy    seasonal   Anxiety    Arthritis    knees,all over   Basal cell carcinoma    Benign breast cyst in female    PATIENT HAS HISTORY OF BREAST CYSTS   Cataract    beginning bilateral   Diabetes mellitus    GERD (gastroesophageal reflux disease)    Headache    Herpes progenitalis    Hyperlipidemia    Hypertension    Kidney stone    Kidney stones    OSA on CPAP    Sleep apnea    cpap   Urinary, incontinence, stress female    Vertigo     Past Surgical History:  Procedure Laterality Date   ABDOMINAL HYSTERECTOMY  1986   leiomyomata   Basal cell excised     breast cystectomy     right   CHOLECYSTECTOMY  2003   COLONOSCOPY     CYSTOSCOPY/URETEROSCOPY/HOLMIUM LASER/STENT PLACEMENT Right 01/12/2022   Procedure: CYSTOSCOPY/RETROGRADE/ URETEROSCOPY/HOLMIUM LASER/STENT PLACEMENT;  Surgeon: Roxane Copp, MD;  Location: [facility];  Service: Urology;  Laterality: Right;   ear cystectomy     ESOPHAGOGASTRODUODENOSCOPY     KNEE ARTHROSCOPY     right   LITHOTRIPSY     RECTOVAGINAL FISTULA CLOSURE     WISDOM TOOTH EXTRACTION      Prior to Admission medications   Medication Sig Start Date End Date Taking? Authorizing Provider  ACCU-CHEK GUIDE test strip USE TO CHECK BLOOD SUGAR TWO TO THREE TIMES A WEEK AS  DIRECTED 02/18/22  Yes [provider]  acetaminophen  (TYLENOL ) 500 MG tablet Take 1,000 mg by mouth every 6 (six) hours as needed (for pain.).   Yes [provider]  aspirin EC 81 MG tablet Take 81 mg by mouth every evening.    Yes [provider]  atorvastatin (LIPITOR) 20 MG tablet Take 20 mg by mouth daily.  02/14/17  Yes [provider]  Blood Glucose Monitoring Suppl (ACCU-CHEK GUIDE ME) w/Device KIT as directed. 01/09/24  Yes [provider]  buPROPion (WELLBUTRIN XL) 150 MG 24 hr tablet Take 150 mg by mouth daily. 08/08/19  Yes [provider]  diclofenac Sodium (VOLTAREN) 1 % GEL Apply 2 g topically daily as needed (pain).   Yes [provider]  donepezil (ARICEPT) 5 MG tablet Take 5 mg by mouth at bedtime. 04/19/24  Yes [provider]  loratadine (CLARITIN) 10 MG tablet Take 10 mg by mouth daily.   Yes [provider]  losartan (COZAAR) 25 MG tablet Take 25 mg by mouth daily. 11/26/22  Yes [provider]  metFORMIN (GLUCOPHAGE-XR) 500 MG 24 hr tablet Take 500 mg by mouth daily.  07/23/19  Yes [provider]  metoprolol  succinate (TOPROL  XL) 50 MG 24 hr tablet Take 1 tablet (50 mg  total) by mouth daily. Take with or immediately following a meal. 12/29/12  Yes Mabe, Mertha Abrahams, MD  Multiple Vitamins-Minerals (ICAPS) CAPS Take 1 capsule by mouth 2 (two) times daily.    Yes [provider]  neomycin-polymyxin b-dexamethasone  (MAXITROL) 3.5-10000-0.1 SUSP 1 drop every 4 (four) hours. 12/30/23  Yes [provider]  omeprazole  (PRILOSEC) 20 MG capsule Take 1 capsule (20 mg total) by mouth 2 (two) times daily before a meal. 02/08/24  Yes Ralston Venus V, MD  Polyethyl Glycol-Propyl Glycol (SYSTANE) 0.4-0.3 % SOLN Place 1 drop into both eyes 2 (two) times daily.   Yes [provider]  RESTASIS 0.05 % ophthalmic emulsion Place 1 drop into both eyes every 12 (twelve) hours. 06/28/19   Yes [provider]  Vitamin D , Cholecalciferol, 400 units TABS Take 400 Units by mouth daily.    Yes [provider]  vitamin E 180 MG (400 UNITS) capsule Take 400 Units by mouth daily.   Yes [provider]  betamethasone  valerate ointment (VALISONE ) 0.1 % Use a pea sized amount topically BID for up to one week as needed. 10/20/21   Jertson, Jill Evelyn, MD  Diaphragm Arc-Spring Gwynn Lesches) [facility] Place 1 Device vaginally as needed. 09/07/21   Dohmeier, Raoul Byes, MD  fluticasone (FLONASE) 50 MCG/ACT nasal spray Place 1 spray into both nostrils daily as needed for allergies. 04/02/19   [provider]  gabapentin  (NEURONTIN ) 100 MG capsule Take 1 capsule (100 mg total) by mouth at bedtime. 05/27/22   Isidro Margo, DO  hydrocortisone  1 % ointment Apply 1 application topically 3 (three) times daily. 09/26/18   Esterwood, Amy S, PA-C  hyoscyamine  (LEVSIN SL) 0.125 MG SL tablet DISSOLVE 1 TABLET IN MOUTH TWICE DAILY AS NEEDED 01/02/24   Milayah Krell V, MD  ibuprofen  (ADVIL ) 400 MG tablet Take 1 tablet (400 mg total) by mouth every 6 (six) hours as needed for moderate pain or mild pain. 12/22/21   Lyna Sandhoff, PA-C  lactobacillus acidophilus (BACID) TABS tablet Take 2 tablets by mouth 3 (three) times daily.    [provider]  lidocaine  (LIDODERM ) 5 % Place 1 patch onto the skin daily. Remove & Discard patch within 12 hours or as directed by MD 09/14/22   Ryelan Kazee V, MD  ondansetron  (ZOFRAN -ODT) 4 MG disintegrating tablet Take 1 tablet (4 mg total) by mouth every 8 (eight) hours as needed for nausea or vomiting. 11/18/23   Lakai Moree V, MD  tamsulosin  (FLOMAX ) 0.4 MG CAPS capsule Take 1 capsule (0.4 mg total) by mouth daily. 12/22/21   Geiple, Joshua, PA-C  topiramate  (TOPAMAX ) 25 MG tablet Take 1 tablet (25 mg total) by mouth 2 (two) times daily. For headache prevention 09/21/23   Syliva Even, MD    Current Outpatient Medications  Medication Sig  Dispense Refill   ACCU-CHEK GUIDE test strip USE TO CHECK BLOOD SUGAR TWO TO THREE TIMES A WEEK AS DIRECTED     acetaminophen  (TYLENOL ) 500 MG tablet Take 1,000 mg by mouth every 6 (six) hours as needed (for pain.).     aspirin EC 81 MG tablet Take 81 mg by mouth every evening.      atorvastatin (LIPITOR) 20 MG tablet Take 20 mg by mouth daily.      Blood Glucose Monitoring Suppl (ACCU-CHEK GUIDE ME) w/Device KIT as directed.     buPROPion (WELLBUTRIN XL) 150 MG 24 hr tablet Take 150 mg by mouth daily.     diclofenac Sodium (VOLTAREN)  1 % GEL Apply 2 g topically daily as needed (pain).     donepezil (ARICEPT) 5 MG tablet Take 5 mg by mouth at bedtime.     loratadine (CLARITIN) 10 MG tablet Take 10 mg by mouth daily.     losartan (COZAAR) 25 MG tablet Take 25 mg by mouth daily.     metFORMIN (GLUCOPHAGE-XR) 500 MG 24 hr tablet Take 500 mg by mouth daily.      metoprolol  succinate (TOPROL  XL) 50 MG 24 hr tablet Take 1 tablet (50 mg total) by mouth daily. Take with or immediately following a meal. 30 tablet 0   Multiple Vitamins-Minerals (ICAPS) CAPS Take 1 capsule by mouth 2 (two) times daily.      neomycin-polymyxin b-dexamethasone  (MAXITROL) 3.5-10000-0.1 SUSP 1 drop every 4 (four) hours.     omeprazole  (PRILOSEC) 20 MG capsule Take 1 capsule (20 mg total) by mouth 2 (two) times daily before a meal. 180 capsule 3   Polyethyl Glycol-Propyl Glycol (SYSTANE) 0.4-0.3 % SOLN Place 1 drop into both eyes 2 (two) times daily.     RESTASIS 0.05 % ophthalmic emulsion Place 1 drop into both eyes every 12 (twelve) hours.     Vitamin D , Cholecalciferol, 400 units TABS Take 400 Units by mouth daily.      vitamin E 180 MG (400 UNITS) capsule Take 400 Units by mouth daily.     betamethasone  valerate ointment (VALISONE ) 0.1 % Use a pea sized amount topically BID for up to one week as needed. 30 g 0   Diaphragm Arc-Spring (CAYA) [facility] Place 1 Device vaginally as needed. 10 each 1   fluticasone (FLONASE) 50  MCG/ACT nasal spray Place 1 spray into both nostrils daily as needed for allergies.     gabapentin  (NEURONTIN ) 100 MG capsule Take 1 capsule (100 mg total) by mouth at bedtime. 30 capsule 3   hydrocortisone  1 % ointment Apply 1 application topically 3 (three) times daily. 30 g 0   hyoscyamine  (LEVSIN SL) 0.125 MG SL tablet DISSOLVE 1 TABLET IN MOUTH TWICE DAILY AS NEEDED 60 tablet 0   ibuprofen  (ADVIL ) 400 MG tablet Take 1 tablet (400 mg total) by mouth every 6 (six) hours as needed for moderate pain or mild pain. 20 tablet 0   lactobacillus acidophilus (BACID) TABS tablet Take 2 tablets by mouth 3 (three) times daily.     lidocaine  (LIDODERM ) 5 % Place 1 patch onto the skin daily. Remove & Discard patch within 12 hours or as directed by MD 30 patch 11   ondansetron  (ZOFRAN -ODT) 4 MG disintegrating tablet Take 1 tablet (4 mg total) by mouth every 8 (eight) hours as needed for nausea or vomiting. 30 tablet 1   tamsulosin  (FLOMAX ) 0.4 MG CAPS capsule Take 1 capsule (0.4 mg total) by mouth daily. 7 capsule 0   topiramate  (TOPAMAX ) 25 MG tablet Take 1 tablet (25 mg total) by mouth 2 (two) times daily. For headache prevention 180 tablet 3   Current Facility-Administered Medications  Medication Dose Route Frequency Provider Last Rate Last Admin   0.9 %  sodium chloride  infusion  500 mL Intravenous Once Roselyne Stalnaker V, MD       betamethasone  acetate-betamethasone  sodium phosphate  (CELESTONE ) injection 3 mg  3 mg Intra-articular Once Evans, Brent M, DPM        Allergies as of 05/16/2024 - Review Complete 05/16/2024  Allergen Reaction Noted   Ciprofloxacin  Rash 09/04/2009   Morphine and codeine Itching 05/07/2011   Prozac [fluoxetine] Other (  See Comments) 02/04/2021   Zoloft [sertraline hcl] Other (See Comments) 02/04/2021   Nortriptyline  hcl Other (See Comments) 04/05/2023    Family History  Problem Relation Age of Onset   Diabetes Mother    Diabetes Father    Colon cancer Father    Heart  disease Father    Hypertension Father    Diabetes Sister    Hypertension Sister    Diabetes Brother    Colon polyps Brother    Prostate cancer Brother    Breast cancer Paternal Aunt        Age 29's   Colon polyps Brother    Heart disease Brother    Stroke Brother    Hyperlipidemia Brother    Colon polyps Sister    Diabetes Sister    Hypertension Sister    Heart disease Sister    Esophageal cancer Neg Hx    Rectal cancer Neg Hx    Stomach cancer Neg Hx     Social History   Socioeconomic History   Marital status: Married    Spouse name: Not on file   Number of children: 2   Years of education: Not on file   Highest education level: Not on file  Occupational History   Occupation: Retired    Associate Professor: RETIRED  Tobacco Use   Smoking status: Former    Types: Cigarettes   Smokeless tobacco: Never  Vaping Use   Vaping status: Never Used  Substance and Sexual Activity   Alcohol use: Not Currently    Comment: rare   Drug use: No   Sexual activity: Not Currently    Birth control/protection: Surgical    Comment: Hyst, Hx of TV+ & CT+  Other Topics Concern   Not on file  Social History Narrative   Denies caffeine use.   Social Drivers of Corporate investment banker Strain: Not on file  Food Insecurity: Not on file  Transportation Needs: Not on file  Physical Activity: Not on file  Stress: Not on file  Social Connections: Not on file  Intimate Partner Violence: Not on file    Review of Systems:  All other review of systems negative except as mentioned in the HPI.  Physical Exam: Vital signs in last 24 hours: BP 122/63   Pulse 83   Temp (!) 97.3 F (36.3 C) (Temporal)   Ht 5\' 1"  (1.549 m)   Wt 104 lb (47.2 kg)   SpO2 98%   BMI 19.65 kg/m  General:   Alert, NAD Lungs:  Clear .   Heart:  Regular rate and rhythm Abdomen:  Soft, nontender and nondistended. Neuro/Psych:  Alert and cooperative. Normal mood and affect. A and O x 3  Reviewed labs, radiology  imaging, old records and pertinent past GI work up  Patient is appropriate for planned procedure(s) and anesthesia in an ambulatory setting   K. Veena Deleon Passe , MD [phone]

## 2024-05-16 NOTE — Patient Instructions (Addendum)
 Resume previous diet Continue present medications, STOP PRILOSEC (OMEPRAZOLE ) AND BEGIN TAKING PROTONIX  (PANTOPRAZOLE ) TO TWICE DAILY Await pathology results  Handouts/information given for GERD, Gastritis, esophagitis, and Hiatal Hernia  YOU HAD AN ENDOSCOPIC PROCEDURE TODAY AT THE [facility]:   Refer to the procedure report that was given to you for any specific questions about what was found during the examination.  If the procedure report does not answer your questions, please call your gastroenterologist to clarify.  If you requested that your care partner not be given the details of your procedure findings, then the procedure report has been included in a sealed envelope for you to review at your convenience later.  YOU SHOULD EXPECT: Some feelings of bloating in the abdomen. Passage of more gas than usual.  Walking can help get rid of the air that was put into your GI tract during the procedure and reduce the bloating. If you had a lower endoscopy (such as a colonoscopy or flexible sigmoidoscopy) you may notice spotting of blood in your stool or on the toilet paper. If you underwent a bowel prep for your procedure, you may not have a normal bowel movement for a few days.  Please Note:  You might notice some irritation and congestion in your nose or some drainage.  This is from the oxygen used during your procedure.  There is no need for concern and it should clear up in a day or so.  SYMPTOMS TO REPORT IMMEDIATELY:  Following upper endoscopy (EGD)  Vomiting of blood or coffee ground material  New chest pain or pain under the shoulder blades  Painful or persistently difficult swallowing  New shortness of breath  Fever of 100F or higher  Black, tarry-looking stools  For urgent or emergent issues, a gastroenterologist can be reached at any hour by calling [phone]. Do not use MyChart messaging for urgent concerns.   DIET:  We do recommend a small meal at first, but  then you may proceed to your regular diet.  Drink plenty of fluids but you should avoid alcoholic beverages for 24 hours.  ACTIVITY:  You should plan to take it easy for the rest of today and you should NOT DRIVE or use heavy machinery until tomorrow (because of the sedation medicines used during the test).    FOLLOW UP: Our staff will call the number listed on your records the next business day following your procedure.  We will call around 7:15- 8:00 am to check on you and address any questions or concerns that you may have regarding the information given to you following your procedure. If we do not reach you, we will leave a message.     If any biopsies were taken you will be contacted by phone or by letter within the next 1-3 weeks.  Please call us  at [phone] if you have not heard about the biopsies in 3 weeks.   SIGNATURES/CONFIDENTIALITY: You and/or your care partner have signed paperwork which will be entered into your electronic medical record.  These signatures attest to the fact that that the information above on your After Visit Summary has been reviewed and is understood.  Full responsibility of the confidentiality of this discharge information lies with you and/or your care-partner.

## 2024-05-16 NOTE — Progress Notes (Signed)
  Pt reports dental procedure since last office visit- removed a tooth. Reports mouth is a little sore right now.

## 2024-05-16 NOTE — Progress Notes (Signed)
 Sedate, gd SR, tolerated procedure well, VSS, report to RN

## 2024-05-16 NOTE — Progress Notes (Signed)
 Called to room to assist during endoscopic procedure.  Patient ID and intended procedure confirmed with present staff. Received instructions for my participation in the procedure from the performing physician.

## 2024-05-17 ENCOUNTER — Telehealth: Payer: Self-pay

## 2024-05-17 ENCOUNTER — Other Ambulatory Visit: Payer: Self-pay

## 2024-05-17 ENCOUNTER — Telehealth: Payer: Self-pay | Admitting: Family Medicine

## 2024-05-17 DIAGNOSIS — S060X9D Concussion with loss of consciousness of unspecified duration, subsequent encounter: Secondary | ICD-10-CM

## 2024-05-17 MED ORDER — TOPIRAMATE 25 MG PO TABS: 25.0000 mg | ORAL_TABLET | Freq: Two times a day (BID) | ORAL | 3 refills | Status: DC

## 2024-05-17 NOTE — Telephone Encounter (Signed)
 Advised for pt to call [facility] and let them know the rx was sent in and to see about getting it filled. Discussed the administration of the rx, bid for HA prevention and to talk to the pharmacist about any further questions. Pt was advised if she cont to have HA after restarting the rx, to call and schedule a f/u visit for further evaluation. Pt verbalized understanding.

## 2024-05-17 NOTE — Telephone Encounter (Signed)
  Follow up Call-     05/16/2024   11:12 AM 07/06/2022    8:18 AM  Call back number  Post procedure Call Back phone  # [phone]  Permission to leave phone message Yes Yes     Patient questions:  Do you have a fever, pain , or abdominal swelling? No. Pain Score  0 *  Have you tolerated food without any problems? Yes.    Have you been able to return to your normal activities? Yes.    Do you have any questions about your discharge instructions: Diet   No. Medications  No. Follow up visit  No.  Do you have questions or concerns about your Care? No.  Actions: * If pain score is 4 or above: No action needed, pain <4.

## 2024-05-17 NOTE — Telephone Encounter (Signed)
 She would like for someone to call her.

## 2024-05-17 NOTE — Telephone Encounter (Signed)
 Patient says Kristen Mahoney is suddenly having headaches in the back of her head again. Kristen Mahoney wants to know if Kristen Mahoney should make another appointment or if Dr. Alease Hunter could give her more medicine to help her for her headaches. Please advise.

## 2024-05-17 NOTE — Telephone Encounter (Signed)
 Called pt and discussed. She was very confused and kept trying to figure out who Dr. Luster Salters was. I clarified.   She reports her new insurance stopped covering her topamax  through the mail delivery service and she now have to use [facility]. New rx for Topamax  sent to [facility]

## 2024-05-18 LAB — SURGICAL PATHOLOGY

## 2024-05-28 DIAGNOSIS — F419 Anxiety disorder, unspecified: Secondary | ICD-10-CM | POA: Diagnosis not present

## 2024-05-28 DIAGNOSIS — E785 Hyperlipidemia, unspecified: Secondary | ICD-10-CM | POA: Diagnosis not present

## 2024-05-28 DIAGNOSIS — Z0000 Encounter for general adult medical examination without abnormal findings: Secondary | ICD-10-CM | POA: Diagnosis not present

## 2024-05-28 DIAGNOSIS — M26621 Arthralgia of right temporomandibular joint: Secondary | ICD-10-CM | POA: Diagnosis not present

## 2024-05-28 DIAGNOSIS — E1169 Type 2 diabetes mellitus with other specified complication: Secondary | ICD-10-CM | POA: Diagnosis not present

## 2024-05-28 DIAGNOSIS — Z681 Body mass index (BMI) 19 or less, adult: Secondary | ICD-10-CM | POA: Diagnosis not present

## 2024-06-13 ENCOUNTER — Ambulatory Visit: Admitting: Adult Health

## 2024-06-18 DIAGNOSIS — H353131 Nonexudative age-related macular degeneration, bilateral, early dry stage: Secondary | ICD-10-CM | POA: Diagnosis not present

## 2024-06-22 DIAGNOSIS — M5416 Radiculopathy, lumbar region: Secondary | ICD-10-CM | POA: Diagnosis not present

## 2024-06-22 DIAGNOSIS — M533 Sacrococcygeal disorders, not elsewhere classified: Secondary | ICD-10-CM | POA: Diagnosis not present

## 2024-06-27 DIAGNOSIS — Z6820 Body mass index (BMI) 20.0-20.9, adult: Secondary | ICD-10-CM | POA: Diagnosis not present

## 2024-06-27 DIAGNOSIS — F419 Anxiety disorder, unspecified: Secondary | ICD-10-CM | POA: Diagnosis not present

## 2024-06-27 DIAGNOSIS — M26621 Arthralgia of right temporomandibular joint: Secondary | ICD-10-CM | POA: Diagnosis not present

## 2024-07-03 DIAGNOSIS — M5416 Radiculopathy, lumbar region: Secondary | ICD-10-CM | POA: Diagnosis not present

## 2024-07-23 ENCOUNTER — Ambulatory Visit: Admitting: Neurology

## 2024-07-27 ENCOUNTER — Ambulatory Visit: Payer: Self-pay | Admitting: Gastroenterology

## 2024-07-31 DIAGNOSIS — M5416 Radiculopathy, lumbar region: Secondary | ICD-10-CM | POA: Diagnosis not present

## 2024-08-01 NOTE — Progress Notes (Unsigned)
 [facility] [address] [place] [address] Phone: [phone] Subjective:   Kristen Mahoney, am serving as a scribe for Dr. Arthea Claudene.  I'm seeing this patient by the request  of:  Katina Pfeiffer, PA-C  CC: Back and neck pain follow-up  YEP:Dlagzrupcz  Kristen Mahoney is a 78 y.o. female coming in with complaint of neck and lumbar spine pain. Last seen in 2023 for DDD lumbar. Patient states that she has been having pain from the base of her skull down to lumbar spine. Said she received epidural one month ago by Dr. Cesario. Injection was not as effective. Has radicular symptoms into R quad.      MRI in 2023 of the lumbar spine did show mild to moderate spinal stenosis with foraminal stenosis from L3-L5 right greater than left.  Epidural was done in October 2023.   Did have CT scan done of the abdomen and pelvis in August 2024 that showed a low-attenuation lesion of the left kidney with recommendation to repeat. Past Medical History:  Diagnosis Date   Allergy    seasonal   Anxiety    Arthritis    knees,all over   Basal cell carcinoma    Benign breast cyst in female    PATIENT HAS HISTORY OF BREAST CYSTS   Cataract    beginning bilateral   Diabetes mellitus    GERD (gastroesophageal reflux disease)    Headache    Herpes progenitalis    Hyperlipidemia    Hypertension    Kidney stone    Kidney stones    OSA on CPAP    Sleep apnea    cpap   Urinary, incontinence, stress female    Vertigo    Past Surgical History:  Procedure Laterality Date   ABDOMINAL HYSTERECTOMY  1986   leiomyomata   Basal cell excised     breast cystectomy     right   CHOLECYSTECTOMY  2003   COLONOSCOPY     CYSTOSCOPY/URETEROSCOPY/HOLMIUM LASER/STENT PLACEMENT Right 01/12/2022   Procedure: CYSTOSCOPY/RETROGRADE/ URETEROSCOPY/HOLMIUM LASER/STENT PLACEMENT;  Surgeon: Elisabeth Valli BIRCH, MD;  Location: [facility];  Service: Urology;  Laterality: Right;   ear  cystectomy     ESOPHAGOGASTRODUODENOSCOPY     KNEE ARTHROSCOPY     right   LITHOTRIPSY     RECTOVAGINAL FISTULA CLOSURE     WISDOM TOOTH EXTRACTION     Social History   Socioeconomic History   Marital status: Married    Spouse name: Not on file   Number of children: 2   Years of education: Not on file   Highest education level: Not on file  Occupational History   Occupation: Retired    Associate Professor: RETIRED  Tobacco Use   Smoking status: Former    Types: Cigarettes   Smokeless tobacco: Never  Vaping Use   Vaping status: Never Used  Substance and Sexual Activity   Alcohol use: Not Currently    Comment: rare   Drug use: No   Sexual activity: Not Currently    Birth control/protection: Surgical    Comment: Hyst, Hx of TV+ & CT+  Other Topics Concern   Not on file  Social History Narrative   Denies caffeine use.   Social Drivers of Corporate investment banker Strain: Not on file  Food Insecurity: Not on file  Transportation Needs: Not on file  Physical Activity: Not on file  Stress: Not on file  Social Connections: Not on file  Allergies  Allergen Reactions   Ciprofloxacin  Rash   Morphine And Codeine Itching   Prozac [Fluoxetine] Other (See Comments)    sweats, jittery   Zoloft [Sertraline Hcl] Other (See Comments)    sweating, jittery   Nortriptyline  Hcl Other (See Comments)    hallucinations   Family History  Problem Relation Age of Onset   Diabetes Mother    Diabetes Father    Colon cancer Father    Heart disease Father    Hypertension Father    Diabetes Sister    Hypertension Sister    Diabetes Brother    Colon polyps Brother    Prostate cancer Brother    Breast cancer Paternal Aunt        Age 23's   Colon polyps Brother    Heart disease Brother    Stroke Brother    Hyperlipidemia Brother    Colon polyps Sister    Diabetes Sister    Hypertension Sister    Heart disease Sister    Esophageal cancer Neg Hx    Rectal cancer Neg Hx    Stomach  cancer Neg Hx     Current Outpatient Medications (Endocrine & Metabolic):    metFORMIN (GLUCOPHAGE-XR) 500 MG 24 hr tablet, Take 500 mg by mouth daily.   Current Facility-Administered Medications (Endocrine & Metabolic):    betamethasone  acetate-betamethasone  sodium phosphate  (CELESTONE ) injection 3 mg  Current Outpatient Medications (Cardiovascular):    atorvastatin (LIPITOR) 20 MG tablet, Take 20 mg by mouth daily.    losartan (COZAAR) 25 MG tablet, Take 25 mg by mouth daily.   metoprolol  succinate (TOPROL  XL) 50 MG 24 hr tablet, Take 1 tablet (50 mg total) by mouth daily. Take with or immediately following a meal.   Current Outpatient Medications (Respiratory):    fluticasone (FLONASE) 50 MCG/ACT nasal spray, Place 1 spray into both nostrils daily as needed for allergies.   loratadine (CLARITIN) 10 MG tablet, Take 10 mg by mouth daily.   Current Outpatient Medications (Analgesics):    acetaminophen  (TYLENOL ) 500 MG tablet, Take 1,000 mg by mouth every 6 (six) hours as needed (for pain.).   aspirin EC 81 MG tablet, Take 81 mg by mouth every evening.    ibuprofen  (ADVIL ) 400 MG tablet, Take 1 tablet (400 mg total) by mouth every 6 (six) hours as needed for moderate pain or mild pain.     Current Outpatient Medications (Other):    ACCU-CHEK GUIDE test strip, USE TO CHECK BLOOD SUGAR TWO TO THREE TIMES A WEEK AS DIRECTED   betamethasone  valerate ointment (VALISONE ) 0.1 %, Use a pea sized amount topically BID for up to one week as needed.   Blood Glucose Monitoring Suppl (ACCU-CHEK GUIDE ME) w/Device KIT, as directed.   buPROPion (WELLBUTRIN XL) 150 MG 24 hr tablet, Take 150 mg by mouth daily.   Diaphragm Arc-Spring (CAYA) DPRH, Place 1 Device vaginally as needed.   diclofenac Sodium (VOLTAREN) 1 % GEL, Apply 2 g topically daily as needed (pain).   donepezil (ARICEPT) 5 MG tablet, Take 5 mg by mouth at bedtime.   DULoxetine  (CYMBALTA ) 20 MG capsule, Take 1 capsule (20 mg total) by  mouth daily.   gabapentin  (NEURONTIN ) 100 MG capsule, Take 1 capsule (100 mg total) by mouth at bedtime.   hydrocortisone  1 % ointment, Apply 1 application topically 3 (three) times daily.   hyoscyamine  (LEVSIN  SL) 0.125 MG SL tablet, DISSOLVE 1 TABLET IN MOUTH TWICE DAILY AS NEEDED   lactobacillus acidophilus (BACID) TABS  tablet, Take 2 tablets by mouth 3 (three) times daily.   lidocaine  (LIDODERM ) 5 %, Place 1 patch onto the skin daily. Remove & Discard patch within 12 hours or as directed by MD   Multiple Vitamins-Minerals (ICAPS) CAPS, Take 1 capsule by mouth 2 (two) times daily.    neomycin-polymyxin b-dexamethasone  (MAXITROL) 3.5-10000-0.1 SUSP, 1 drop every 4 (four) hours.   ondansetron  (ZOFRAN -ODT) 4 MG disintegrating tablet, Take 1 tablet (4 mg total) by mouth every 8 (eight) hours as needed for nausea or vomiting.   pantoprazole  (PROTONIX ) 40 MG tablet, Take 1 tablet (40 mg total) by mouth 2 (two) times daily before a meal.   Polyethyl Glycol-Propyl Glycol (SYSTANE) 0.4-0.3 % SOLN, Place 1 drop into both eyes 2 (two) times daily.   RESTASIS 0.05 % ophthalmic emulsion, Place 1 drop into both eyes every 12 (twelve) hours.   tamsulosin  (FLOMAX ) 0.4 MG CAPS capsule, Take 1 capsule (0.4 mg total) by mouth daily.   topiramate  (TOPAMAX ) 25 MG tablet, Take 1 tablet (25 mg total) by mouth 2 (two) times daily. For headache prevention   Vitamin D , Cholecalciferol, 400 units TABS, Take 400 Units by mouth daily.    vitamin E 180 MG (400 UNITS) capsule, Take 400 Units by mouth daily.    Reviewed prior external information including notes and imaging from  primary care provider As well as notes that were available from care everywhere and other healthcare systems.  Past medical history, social, surgical and family history all reviewed in electronic medical record.  No pertanent information unless stated regarding to the chief complaint.   Review of Systems:  No headache, visual changes, nausea,  vomiting, diarrhea, constipation, dizziness, abdominal pain, skin rash, fevers, chills, night sweats, weight loss, swollen lymph nodes, body aches, joint swelling, chest pain, shortness of breath, mood changes. POSITIVE muscle aches  Objective  Blood pressure 110/72, pulse 86, height 5' 1 (1.549 m), SpO2 99%.   General: No apparent distress alert and oriented x3 mood and affect normal, dressed appropriately.  HEENT: Pupils equal, extraocular movements intact  Respiratory: Patient's speak in full sentences and does not appear short of breath  Cardiovascular: No lower extremity edema, non tender, no erythema  Low back exam shows significant loss of lordosis.  Patient has had weight loss since we have seen patient.  Patient is tender diffusely of the paraspinal musculature.  Patient does have some atrophy of the lower extremities noted.    Impression and Recommendations:     The above documentation has been reviewed and is accurate and complete Kristen Vanduyne M Agapita Savarino, DO

## 2024-08-03 ENCOUNTER — Ambulatory Visit: Admitting: Family Medicine

## 2024-08-03 ENCOUNTER — Ambulatory Visit: Payer: Self-pay | Admitting: Family Medicine

## 2024-08-03 VITALS — BP 110/72 | HR 86 | Ht 61.0 in

## 2024-08-03 DIAGNOSIS — Z87898 Personal history of other specified conditions: Secondary | ICD-10-CM

## 2024-08-03 DIAGNOSIS — M2550 Pain in unspecified joint: Secondary | ICD-10-CM | POA: Diagnosis not present

## 2024-08-03 DIAGNOSIS — M51362 Other intervertebral disc degeneration, lumbar region with discogenic back pain and lower extremity pain: Secondary | ICD-10-CM | POA: Diagnosis not present

## 2024-08-03 LAB — COMPREHENSIVE METABOLIC PANEL WITH GFR
ALT: 10 U/L (ref 0–35)
AST: 15 U/L (ref 0–37)
Albumin: 4.1 g/dL (ref 3.5–5.2)
Alkaline Phosphatase: 63 U/L (ref 39–117)
BUN: 17 mg/dL (ref 6–23)
CO2: 28 meq/L (ref 19–32)
Calcium: 9.7 mg/dL (ref 8.4–10.5)
Chloride: 110 meq/L (ref 96–112)
Creatinine, Ser: 0.99 mg/dL (ref 0.40–1.20)
GFR: 54.57 mL/min — ABNORMAL LOW (ref >60.00)
Glucose, Bld: 98 mg/dL (ref 70–99)
Potassium: 5.5 meq/L — ABNORMAL HIGH (ref 3.5–5.1)
Sodium: 147 meq/L — ABNORMAL HIGH (ref 135–145)
Total Bilirubin: 0.3 mg/dL (ref 0.2–1.2)
Total Protein: 6.7 g/dL (ref 6.0–8.3)

## 2024-08-03 LAB — CBC WITH DIFFERENTIAL/PLATELET
Basophils Absolute: 0 K/uL (ref 0.0–0.1)
Basophils Relative: 0.8 % (ref 0.0–3.0)
Eosinophils Absolute: 0.1 K/uL (ref 0.0–0.7)
Eosinophils Relative: 1.9 % (ref 0.0–5.0)
HCT: 40.3 % (ref 36.0–46.0)
Hemoglobin: 13.1 g/dL (ref 12.0–15.0)
Lymphocytes Relative: 24.2 % (ref 12.0–46.0)
Lymphs Abs: 1 K/uL (ref 0.7–4.0)
MCHC: 32.4 g/dL (ref 30.0–36.0)
MCV: 95.9 fl (ref 78.0–100.0)
Monocytes Absolute: 0.3 K/uL (ref 0.1–1.0)
Monocytes Relative: 6.6 % (ref 3.0–12.0)
Neutro Abs: 2.8 K/uL (ref 1.4–7.7)
Neutrophils Relative %: 66.5 % (ref 43.0–77.0)
Platelets: 202 K/uL (ref 150.0–400.0)
RBC: 4.21 Mil/uL (ref 3.87–5.11)
RDW: 13.9 % (ref 11.5–15.5)
WBC: 4.2 K/uL (ref 4.0–10.5)

## 2024-08-03 LAB — VITAMIN B12: Vitamin B-12: 290 pg/mL (ref 211–911)

## 2024-08-03 LAB — LIPASE: Lipase: 154 U/L — ABNORMAL HIGH (ref 11.0–59.0)

## 2024-08-03 LAB — IBC PANEL
Iron: 92 ug/dL (ref 42–145)
Saturation Ratios: 27.3 % (ref 20.0–50.0)
TIBC: 337.4 ug/dL (ref 250.0–450.0)
Transferrin: 241 mg/dL (ref 212.0–360.0)

## 2024-08-03 LAB — FERRITIN: Ferritin: 8.6 ng/mL — ABNORMAL LOW (ref 10.0–291.0)

## 2024-08-03 MED ORDER — DULOXETINE HCL 20 MG PO CPEP: 20.0000 mg | ORAL_CAPSULE | Freq: Every day | ORAL | 0 refills | Status: DC

## 2024-08-03 NOTE — Assessment & Plan Note (Signed)
 Will get laboratory workup with patient having elevation in lipase level previously.

## 2024-08-03 NOTE — Assessment & Plan Note (Signed)
 Significant arthritis of the back, has not responded to epidurals or facet injections.  We discussed considering medial branch blocks and seeing if she was a candidate for radiofrequency ablation but does have some radicular symptoms I continue to give her difficulty as well.  We discussed different medications at great length and patient was willing to try the Cymbalta .  Patient is not on any other antidepressants.  Warned of potential side effects.  Patient still wanted to try it with 20 mg.  We will see how patient responds and can follow-up with me or Dr. Joane  in 2 months.  Total time with patient today 35 minutes

## 2024-08-03 NOTE — Patient Instructions (Addendum)
 Cymbalta  20mg  Labs today See me or Dr. Joane in 2 months

## 2024-08-06 ENCOUNTER — Other Ambulatory Visit: Payer: Self-pay

## 2024-08-06 ENCOUNTER — Encounter: Payer: Self-pay | Admitting: Family Medicine

## 2024-08-06 DIAGNOSIS — R748 Abnormal levels of other serum enzymes: Secondary | ICD-10-CM

## 2024-08-06 NOTE — Progress Notes (Signed)
 STAT CT placed

## 2024-08-06 NOTE — Progress Notes (Signed)
 Left message for patient to call back for results.

## 2024-08-10 ENCOUNTER — Ambulatory Visit
Admission: RE | Admit: 2024-08-10 | Discharge: 2024-08-10 | Disposition: A | Discharge status: Home / Self Care | Source: Ambulatory Visit | Attending: Family Medicine | Admitting: Family Medicine

## 2024-08-10 ENCOUNTER — Ambulatory Visit: Payer: Self-pay | Admitting: Family Medicine

## 2024-08-10 DIAGNOSIS — N83291 Other ovarian cyst, right side: Secondary | ICD-10-CM | POA: Diagnosis not present

## 2024-08-10 DIAGNOSIS — R748 Abnormal levels of other serum enzymes: Secondary | ICD-10-CM

## 2024-08-10 DIAGNOSIS — N200 Calculus of kidney: Secondary | ICD-10-CM | POA: Diagnosis not present

## 2024-08-10 MED ORDER — IOPAMIDOL (ISOVUE-370) INJECTION 76%
60.0000 mL | Freq: Once | INTRAVENOUS | Status: AC | PRN
  Administered 2024-08-10: 60 mL via INTRAVENOUS

## 2024-08-16 ENCOUNTER — Telehealth: Payer: Self-pay | Admitting: Family Medicine

## 2024-08-16 NOTE — Telephone Encounter (Signed)
 Pt called, started Cymbalta  this month and has felt sore all over since. Would like the OK to stop this med, states she does not need a replacement med at this time.

## 2024-08-16 NOTE — Telephone Encounter (Signed)
 Pt called, given Dr. Theressa response.

## 2024-08-29 DIAGNOSIS — M170 Bilateral primary osteoarthritis of knee: Secondary | ICD-10-CM | POA: Diagnosis not present

## 2024-08-29 DIAGNOSIS — E1122 Type 2 diabetes mellitus with diabetic chronic kidney disease: Secondary | ICD-10-CM | POA: Diagnosis not present

## 2024-08-29 DIAGNOSIS — E782 Mixed hyperlipidemia: Secondary | ICD-10-CM | POA: Diagnosis not present

## 2024-08-29 DIAGNOSIS — M1712 Unilateral primary osteoarthritis, left knee: Secondary | ICD-10-CM | POA: Diagnosis not present

## 2024-08-29 DIAGNOSIS — I10 Essential (primary) hypertension: Secondary | ICD-10-CM | POA: Diagnosis not present

## 2024-08-29 DIAGNOSIS — R0789 Other chest pain: Secondary | ICD-10-CM | POA: Diagnosis not present

## 2024-08-29 DIAGNOSIS — M1711 Unilateral primary osteoarthritis, right knee: Secondary | ICD-10-CM | POA: Diagnosis not present

## 2024-09-07 DIAGNOSIS — Z23 Encounter for immunization: Secondary | ICD-10-CM | POA: Diagnosis not present

## 2024-09-17 DIAGNOSIS — R0789 Other chest pain: Secondary | ICD-10-CM | POA: Diagnosis not present

## 2024-09-17 DIAGNOSIS — E1122 Type 2 diabetes mellitus with diabetic chronic kidney disease: Secondary | ICD-10-CM | POA: Diagnosis not present

## 2024-09-17 DIAGNOSIS — I10 Essential (primary) hypertension: Secondary | ICD-10-CM | POA: Diagnosis not present

## 2024-09-17 DIAGNOSIS — E782 Mixed hyperlipidemia: Secondary | ICD-10-CM | POA: Diagnosis not present

## 2024-09-29 ENCOUNTER — Encounter (HOSPITAL_COMMUNITY): Payer: Self-pay | Admitting: Emergency Medicine

## 2024-09-29 ENCOUNTER — Other Ambulatory Visit: Payer: Self-pay

## 2024-09-29 ENCOUNTER — Emergency Department (HOSPITAL_COMMUNITY)
Admission: EM | Admit: 2024-09-29 | Discharge: 2024-09-29 | Disposition: A | Discharge status: Home / Self Care | Attending: Emergency Medicine | Admitting: Emergency Medicine

## 2024-09-29 ENCOUNTER — Emergency Department (HOSPITAL_COMMUNITY)

## 2024-09-29 DIAGNOSIS — I10 Essential (primary) hypertension: Secondary | ICD-10-CM | POA: Diagnosis not present

## 2024-09-29 DIAGNOSIS — Z79899 Other long term (current) drug therapy: Secondary | ICD-10-CM | POA: Diagnosis not present

## 2024-09-29 DIAGNOSIS — E119 Type 2 diabetes mellitus without complications: Secondary | ICD-10-CM | POA: Insufficient documentation

## 2024-09-29 DIAGNOSIS — M25512 Pain in left shoulder: Secondary | ICD-10-CM | POA: Diagnosis not present

## 2024-09-29 DIAGNOSIS — Z7982 Long term (current) use of aspirin: Secondary | ICD-10-CM | POA: Insufficient documentation

## 2024-09-29 DIAGNOSIS — Z7984 Long term (current) use of oral hypoglycemic drugs: Secondary | ICD-10-CM | POA: Insufficient documentation

## 2024-09-29 MED ORDER — PREDNISONE 20 MG PO TABS: 40.0000 mg | ORAL_TABLET | Freq: Every day | ORAL | 0 refills | Status: AC

## 2024-09-29 MED ORDER — LIDOCAINE 5 % EX PTCH
1.0000 | MEDICATED_PATCH | CUTANEOUS | Status: DC
  Administered 2024-09-29: 1 via TRANSDERMAL
  Filled 2024-09-29: qty 1

## 2024-09-29 MED ORDER — ACETAMINOPHEN 500 MG PO TABS
1000.0000 mg | ORAL_TABLET | Freq: Once | ORAL | Status: AC
  Administered 2024-09-29: 1000 mg via ORAL
  Filled 2024-09-29: qty 2

## 2024-09-29 NOTE — Progress Notes (Signed)
 Orthopedic Tech Progress Note Patient Details:  Kristen Mahoney 1946-11-30 [id number]  Ortho Devices Type of Ortho Device: Shoulder immobilizer Ortho Device/Splint Location: LUE Ortho Device/Splint Interventions: Ordered, Application, Adjustment   Post Interventions Patient Tolerated: Well Instructions Provided: Adjustment of device, Care of device  Kristen Mahoney 09/29/2024, 5:59 PM

## 2024-09-29 NOTE — Discharge Instructions (Signed)
 You were seen in the ER today for evaluation of your shoulder pain. I think this is likely from your overuse of the shoulder. You need to rest! I am giving you a steroid to help with the inflammation. Please make sure that you are watching your sugars as the steroid can elevate them. You can take Tylenol  as needed for pain. I have included the information for an orthopedic provider for you to follow up with. Please call. If you have any concerns, new or worsening symptoms, please return to the nearest ER for re-evaluation.   Contact a health care provider if: Your pain is not relieved with medicines. New pain develops in your arm, hand, or fingers. You loosen your sling and your arm, hand, or fingers remain tingly, numb, swollen, or painful. Get help right away if: Your arm, hand, or fingers turn white or blue.

## 2024-09-29 NOTE — ED Triage Notes (Signed)
 Pt reports left shoulder pain and swelling for 3 days.  No known injury.  No previous issues with this shoulder.

## 2024-09-29 NOTE — ED Provider Notes (Signed)
 I provided a substantive portion of the care of this patient.  I personally made/approved the management plan for this patient and take responsibility for the patient management.      78 year old female presents with left sided shoulder pain due to increased activity.  X-ray consistent with bursitis.  Will place on short course of steroids and discharge   Dasie Faden, MD 09/29/24 1745

## 2024-09-29 NOTE — ED Provider Notes (Signed)
 [place] EMERGENCY DEPARTMENT AT [facility] Provider Note   CSN: [id number] Arrival date & time: 09/29/24  1542     Patient presents with: Shoulder Pain   Kristen Mahoney is a 78 y.o. female with history of diabetes, hypertension, polyarthralgia presents to the emerged from today for evaluation of left shoulder pain for the past 3 days.  Denies any injury to the area however patient has been using it more by cooking in preparation for [employer] homecoming. Denies any numbness, tingling, or fever. Denies any previous surgery to the area.    Shoulder Pain Associated symptoms: no fever        Prior to Admission medications   Medication Sig Start Date End Date Taking? Authorizing Provider  predniSONE (DELTASONE) 20 MG tablet Take 2 tablets (40 mg total) by mouth daily for 5 days. 09/29/24 10/04/24 Yes Bernis Ernst, PA-C  ACCU-CHEK GUIDE test strip USE TO CHECK BLOOD SUGAR TWO TO THREE TIMES A WEEK AS DIRECTED 02/18/22   [provider]  acetaminophen  (TYLENOL ) 500 MG tablet Take 1,000 mg by mouth every 6 (six) hours as needed (for pain.).    [provider]  aspirin EC 81 MG tablet Take 81 mg by mouth every evening.     [provider]  atorvastatin (LIPITOR) 20 MG tablet Take 20 mg by mouth daily.  02/14/17   [provider]  betamethasone  valerate ointment (VALISONE ) 0.1 % Use a pea sized amount topically BID for up to one week as needed. 10/20/21   Jertson, Jill Evelyn, MD  Blood Glucose Monitoring Suppl (ACCU-CHEK GUIDE ME) w/Device KIT as directed. 01/09/24   [provider]  buPROPion (WELLBUTRIN XL) 150 MG 24 hr tablet Take 150 mg by mouth daily. 08/08/19   [provider]  Diaphragm Arc-Spring CASSAUNDRA) DPRH Place 1 Device vaginally as needed. 09/07/21   Dohmeier, Dedra, MD  diclofenac Sodium (VOLTAREN) 1 % GEL Apply 2 g topically daily as needed (pain).    [provider]  donepezil (ARICEPT) 5 MG tablet Take 5 mg by  mouth at bedtime. 04/19/24   [provider]  DULoxetine  (CYMBALTA ) 20 MG capsule Take 1 capsule (20 mg total) by mouth daily. 08/03/24   Smith, Zachary M, DO  fluticasone (FLONASE) 50 MCG/ACT nasal spray Place 1 spray into both nostrils daily as needed for allergies. 04/02/19   [provider]  gabapentin  (NEURONTIN ) 100 MG capsule Take 1 capsule (100 mg total) by mouth at bedtime. 05/27/22   Claudene Arthea HERO, DO  hydrocortisone  1 % ointment Apply 1 application topically 3 (three) times daily. 09/26/18   Esterwood, Amy S, PA-C  hyoscyamine  (LEVSIN  SL) 0.125 MG SL tablet DISSOLVE 1 TABLET IN MOUTH TWICE DAILY AS NEEDED 01/02/24   Nandigam, Kavitha V, MD  ibuprofen  (ADVIL ) 400 MG tablet Take 1 tablet (400 mg total) by mouth every 6 (six) hours as needed for moderate pain or mild pain. 12/22/21   Desiderio Chew, PA-C  lactobacillus acidophilus (BACID) TABS tablet Take 2 tablets by mouth 3 (three) times daily.    [provider]  lidocaine  (LIDODERM ) 5 % Place 1 patch onto the skin daily. Remove & Discard patch within 12 hours or as directed by MD 09/14/22   Nandigam, Kavitha V, MD  loratadine (CLARITIN) 10 MG tablet Take 10 mg by mouth daily.    [provider]  losartan (COZAAR) 25 MG tablet Take 25 mg by mouth daily. 11/26/22   [provider]  metFORMIN (  GLUCOPHAGE-XR) 500 MG 24 hr tablet Take 500 mg by mouth daily.  07/23/19   [provider]  metoprolol  succinate (TOPROL  XL) 50 MG 24 hr tablet Take 1 tablet (50 mg total) by mouth daily. Take with or immediately following a meal. 12/29/12   Mabe, [place] CROME, MD  Multiple Vitamins-Minerals (ICAPS) CAPS Take 1 capsule by mouth 2 (two) times daily.     [provider]  neomycin-polymyxin b-dexamethasone  (MAXITROL) 3.5-10000-0.1 SUSP 1 drop every 4 (four) hours. 12/30/23   [provider]  ondansetron  (ZOFRAN -ODT) 4 MG disintegrating tablet Take 1 tablet (4 mg total) by mouth every 8 (eight) hours as  needed for nausea or vomiting. 11/18/23   Nandigam, Kavitha V, MD  pantoprazole  (PROTONIX ) 40 MG tablet Take 1 tablet (40 mg total) by mouth 2 (two) times daily before a meal. 05/16/24   Nandigam, Kavitha V, MD  Polyethyl Glycol-Propyl Glycol (SYSTANE) 0.4-0.3 % SOLN Place 1 drop into both eyes 2 (two) times daily.    [provider]  RESTASIS 0.05 % ophthalmic emulsion Place 1 drop into both eyes every 12 (twelve) hours. 06/28/19   [provider]  tamsulosin  (FLOMAX ) 0.4 MG CAPS capsule Take 1 capsule (0.4 mg total) by mouth daily. 12/22/21   Geiple, Joshua, PA-C  topiramate  (TOPAMAX ) 25 MG tablet Take 1 tablet (25 mg total) by mouth 2 (two) times daily. For headache prevention 05/17/24   Corey, Evan S, MD  Vitamin D , Cholecalciferol, 400 units TABS Take 400 Units by mouth daily.     [provider]  vitamin E 180 MG (400 UNITS) capsule Take 400 Units by mouth daily.    [provider]    Allergies: Ciprofloxacin , Morphine and codeine, Prozac [fluoxetine], Zoloft [sertraline hcl], and Nortriptyline  hcl    Review of Systems  Constitutional:  Negative for chills and fever.  Respiratory:  Negative for shortness of breath.   Cardiovascular:  Negative for chest pain.  Musculoskeletal:  Positive for arthralgias.  Neurological:  Negative for numbness.    Updated Vital Signs BP (!) 160/71   Pulse 66   Temp 97.7 F (36.5 C) (Oral)   Resp 16   SpO2 97%   Physical Exam Vitals and nursing note reviewed.  Constitutional:      General: She is not in acute distress.    Appearance: She is not ill-appearing or toxic-appearing.  Eyes:     General: No scleral icterus. Pulmonary:     Effort: Pulmonary effort is normal. No respiratory distress.  Musculoskeletal:        General: Tenderness present.     Comments: Some tenderness the more anterior lateral aspect of the left shoulder.  Some overlying swelling but no overlying skin change noted.  No induration or  fluctuance.  Compartments are soft throughout the arm.  Neurovascular intact.  Grip strength equal.  Radial pulse intact.  No swelling noted to the arm.  Does have limited range of motion of the shoulder secondary to pain but are able to move it more with passive range of motion.  Skin:    General: Skin is warm and dry.  Neurological:     Mental Status: She is alert.     (all labs ordered are listed, but only abnormal results are displayed) Labs Reviewed - No data to display  EKG: None  Radiology: DG Shoulder Left Result Date: 09/29/2024 CLINICAL DATA:  Left shoulder pain for 3 days.  No known injury. EXAM: LEFT SHOULDER - 2+ VIEW COMPARISON:  None Available. FINDINGS: There is no evidence of fracture or dislocation. The alignment is maintained. Trace subacromial spur. No erosive or bony destructive change. Amorphous calcifications superolateral to the humeral head span approximately 2.6 cm. Soft tissues are unremarkable. IMPRESSION: 1. Amorphous calcifications superolateral to the humeral head, may represent calcific tendinopathy or bursitis. 2. Trace subacromial spur. Electronically Signed   By: Andrea Gasman M.D.   On: 09/29/2024 16:33    Procedures   Medications Ordered in the ED  lidocaine  (LIDODERM ) 5 % 1 patch (1 patch Transdermal Patch Applied 09/29/24 1756)  acetaminophen  (TYLENOL ) tablet 1,000 mg (1,000 mg Oral Given 09/29/24 1755)                                 Medical Decision Making Amount and/or Complexity of Data Reviewed Radiology: ordered.  Risk OTC drugs. Prescription drug management.    78 y.o. female presents to the ER for evaluation of left shoulder pain. Differential diagnosis includes but is not limited to arthritis, septic arthritis, bursitis, bony injury. Vital signs mildly elevated blood pressure otherwise unremarkable. Physical exam as noted above.   XR imaging shows 2.6 cm. Soft tissues are unremarkable. IMPRESSION: 1. Amorphous calcifications  superolateral to the humeral head, may represent calcific tendinopathy or bursitis. 2. Trace subacromial spur.   My attending saw the patient at bedside and agrees with workup. Recommended prednisone. Discussed with patient that it could raise her blood sugars.  She is currently being managed with metformin.  Appears her A1c is in the low sixes.  I had recommended she continuously monitor her blood sugars while on the prednisone and adjust her diet to soda.  Recommended that she rest the shoulder to improve the symptoms.  She was given a lidocaine  patch here and a shoulder sling as she did drive here.  She is neurovasc intact distally.  I doubt any septic arthritis however will give her return precautions.  Orthopedic information given.  Stable for discharge home.  We discussed the results of the labs/imaging. The plan is take prednisone, follow up with ortho, rest. We discussed strict return precautions and red flag symptoms. The patient verbalized their understanding and agrees to the plan. The patient is stable and being discharged home in good condition.  I discussed this case with my attending physician who cosigned this note including patient's presenting symptoms, physical exam, and planned diagnostics and interventions. Attending physician stated agreement with plan or made changes to plan which were implemented.   Attending physician assessed patient at bedside.  Portions of this report may have been transcribed using voice recognition software. Every effort was made to ensure accuracy; however, inadvertent computerized transcription errors may be present.    Final diagnoses:  Acute pain of left shoulder    ED Discharge Orders          Ordered    predniSONE (DELTASONE) 20 MG tablet  Daily        09/29/24 1738               Jailee, Jaquez, PA-C 09/29/24 1909    Dasie Faden, MD 09/30/24 1642

## 2024-09-30 DIAGNOSIS — S43085A Other dislocation of left shoulder joint, initial encounter: Secondary | ICD-10-CM | POA: Diagnosis not present

## 2024-09-30 DIAGNOSIS — S43491A Other sprain of right shoulder joint, initial encounter: Secondary | ICD-10-CM | POA: Diagnosis not present

## 2024-10-03 ENCOUNTER — Telehealth: Payer: Self-pay | Admitting: Gastroenterology

## 2024-10-03 NOTE — Progress Notes (Unsigned)
 [facility] [address] [place] [address] Phone: [phone] Subjective:   ISusannah Gully, am serving as a scribe for Dr. Arthea Claudene.  I'm seeing this patient by the request  of:  Katina Pfeiffer, PA-C  CC: Multiple joint complaints  YEP:Dlagzrupcz  08/03/2024 Will get laboratory workup with patient having elevation in lipase level previously.   Significant arthritis of the back, has not responded to epidurals or facet injections.  We discussed considering medial branch blocks and seeing if she was a candidate for radiofrequency ablation but does have some radicular symptoms I continue to give her difficulty as well.  We discussed different medications at great length and patient was willing to try the Cymbalta .  Patient is not on any other antidepressants.  Warned of potential side effects.  Patient still wanted to try it with 20 mg.  We will see how patient responds and can follow-up with me or Dr. Joane  in 2 months.  Total time with patient today 35 minutes     Update 10/04/2024 SANIA NOY is a 78 y.o. female coming in with complaint of lumbar spine and L shoulder pain. Patient seen in ED for L shoulder pain on 09/29/2024. Patient states   Xray L shoulder 09/29/2024 IMPRESSION: 1. Amorphous calcifications superolateral to the humeral head, may represent calcific tendinopathy or bursitis. 2. Trace subacromial spur.    Laboratory workup did show significant elevation in lipase level.  Ferritin was quite low as well.  B12 was low at 290.  Due to patient's symptoms and patient's elevated lipase we did order a CT abdomen and pelvis with contrast.  Found to have ovarian cysts but they are stable from imaging in 2021.  Patient does have a 2 mm kidney stone on the right side.  No other abnormality noted of the pancreas on exam.  Past Medical History:  Diagnosis Date   Allergy    seasonal   Anxiety    Arthritis    knees,all over   Basal cell  carcinoma    Benign breast cyst in female    PATIENT HAS HISTORY OF BREAST CYSTS   Cataract    beginning bilateral   Diabetes mellitus    GERD (gastroesophageal reflux disease)    Headache    Herpes progenitalis    Hyperlipidemia    Hypertension    Kidney stone    Kidney stones    OSA on CPAP    Sleep apnea    cpap   Urinary, incontinence, stress female    Vertigo    Past Surgical History:  Procedure Laterality Date   ABDOMINAL HYSTERECTOMY  1986   leiomyomata   Basal cell excised     breast cystectomy     right   CHOLECYSTECTOMY  2003   COLONOSCOPY     CYSTOSCOPY/URETEROSCOPY/HOLMIUM LASER/STENT PLACEMENT Right 01/12/2022   Procedure: CYSTOSCOPY/RETROGRADE/ URETEROSCOPY/HOLMIUM LASER/STENT PLACEMENT;  Surgeon: Elisabeth Valli BIRCH, MD;  Location: [facility];  Service: Urology;  Laterality: Right;   ear cystectomy     ESOPHAGOGASTRODUODENOSCOPY     KNEE ARTHROSCOPY     right   LITHOTRIPSY     RECTOVAGINAL FISTULA CLOSURE     WISDOM TOOTH EXTRACTION     Social History   Socioeconomic History   Marital status: Married    Spouse name: Not on file   Number of children: 2   Years of education: Not on file   Highest education level: Not on file  Occupational  History   Occupation: Retired    Associate Professor: RETIRED  Tobacco Use   Smoking status: Former    Types: Cigarettes   Smokeless tobacco: Never  Vaping Use   Vaping status: Never Used  Substance and Sexual Activity   Alcohol use: Not Currently    Comment: rare   Drug use: No   Sexual activity: Not Currently    Birth control/protection: Surgical    Comment: Hyst, Hx of TV+ & CT+  Other Topics Concern   Not on file  Social History Narrative   Denies caffeine use.   Social Drivers of Corporate investment banker Strain: Not on file  Food Insecurity: Not on file  Transportation Needs: Not on file  Physical Activity: Not on file  Stress: Not on file  Social Connections: Not on file   Allergies  Allergen Reactions    Ciprofloxacin  Rash   Morphine And Codeine Itching   Prozac [Fluoxetine] Other (See Comments)    sweats, jittery   Zoloft [Sertraline Hcl] Other (See Comments)    sweating, jittery   Nortriptyline  Hcl Other (See Comments)    hallucinations   Family History  Problem Relation Age of Onset   Diabetes Mother    Diabetes Father    Colon cancer Father    Heart disease Father    Hypertension Father    Diabetes Sister    Hypertension Sister    Diabetes Brother    Colon polyps Brother    Prostate cancer Brother    Breast cancer Paternal Aunt        Age 38's   Colon polyps Brother    Heart disease Brother    Stroke Brother    Hyperlipidemia Brother    Colon polyps Sister    Diabetes Sister    Hypertension Sister    Heart disease Sister    Esophageal cancer Neg Hx    Rectal cancer Neg Hx    Stomach cancer Neg Hx     Current Outpatient Medications (Endocrine & Metabolic):    metFORMIN (GLUCOPHAGE-XR) 500 MG 24 hr tablet, Take 500 mg by mouth daily.    predniSONE (DELTASONE) 20 MG tablet, Take 2 tablets (40 mg total) by mouth daily for 5 days.  Current Facility-Administered Medications (Endocrine & Metabolic):    betamethasone  acetate-betamethasone  sodium phosphate  (CELESTONE ) injection 3 mg  Current Outpatient Medications (Cardiovascular):    atorvastatin (LIPITOR) 20 MG tablet, Take 20 mg by mouth daily.    losartan (COZAAR) 25 MG tablet, Take 25 mg by mouth daily.   metoprolol  succinate (TOPROL  XL) 50 MG 24 hr tablet, Take 1 tablet (50 mg total) by mouth daily. Take with or immediately following a meal.   Current Outpatient Medications (Respiratory):    fluticasone (FLONASE) 50 MCG/ACT nasal spray, Place 1 spray into both nostrils daily as needed for allergies.   loratadine (CLARITIN) 10 MG tablet, Take 10 mg by mouth daily.   Current Outpatient Medications (Analgesics):    acetaminophen  (TYLENOL ) 500 MG tablet, Take 1,000 mg by mouth every 6 (six) hours as needed (for  pain.).   aspirin EC 81 MG tablet, Take 81 mg by mouth every evening.    ibuprofen  (ADVIL ) 400 MG tablet, Take 1 tablet (400 mg total) by mouth every 6 (six) hours as needed for moderate pain or mild pain.     Current Outpatient Medications (Other):    ACCU-CHEK GUIDE test strip, USE TO CHECK BLOOD SUGAR TWO TO THREE TIMES A WEEK AS DIRECTED  betamethasone  valerate ointment (VALISONE ) 0.1 %, Use a pea sized amount topically BID for up to one week as needed.   Blood Glucose Monitoring Suppl (ACCU-CHEK GUIDE ME) w/Device KIT, as directed.   buPROPion (WELLBUTRIN XL) 150 MG 24 hr tablet, Take 150 mg by mouth daily.   Diaphragm Arc-Spring (CAYA) DPRH, Place 1 Device vaginally as needed.   diclofenac Sodium (VOLTAREN) 1 % GEL, Apply 2 g topically daily as needed (pain).   donepezil (ARICEPT) 5 MG tablet, Take 5 mg by mouth at bedtime.   DULoxetine  (CYMBALTA ) 20 MG capsule, Take 1 capsule (20 mg total) by mouth daily.   gabapentin  (NEURONTIN ) 100 MG capsule, Take 1 capsule (100 mg total) by mouth at bedtime.   hydrocortisone  1 % ointment, Apply 1 application topically 3 (three) times daily.   hyoscyamine  (LEVSIN  SL) 0.125 MG SL tablet, DISSOLVE 1 TABLET IN MOUTH TWICE DAILY AS NEEDED   lactobacillus acidophilus (BACID) TABS tablet, Take 2 tablets by mouth 3 (three) times daily.   lidocaine  (LIDODERM ) 5 %, Place 1 patch onto the skin daily. Remove & Discard patch within 12 hours or as directed by MD   Multiple Vitamins-Minerals (ICAPS) CAPS, Take 1 capsule by mouth 2 (two) times daily.    neomycin-polymyxin b-dexamethasone  (MAXITROL) 3.5-10000-0.1 SUSP, 1 drop every 4 (four) hours.   ondansetron  (ZOFRAN -ODT) 4 MG disintegrating tablet, Take 1 tablet (4 mg total) by mouth every 8 (eight) hours as needed for nausea or vomiting.   pantoprazole  (PROTONIX ) 40 MG tablet, Take 1 tablet (40 mg total) by mouth 2 (two) times daily before a meal.   Polyethyl Glycol-Propyl Glycol (SYSTANE) 0.4-0.3 % SOLN,  Place 1 drop into both eyes 2 (two) times daily.   RESTASIS 0.05 % ophthalmic emulsion, Place 1 drop into both eyes every 12 (twelve) hours.   tamsulosin  (FLOMAX ) 0.4 MG CAPS capsule, Take 1 capsule (0.4 mg total) by mouth daily.   topiramate  (TOPAMAX ) 25 MG tablet, Take 1 tablet (25 mg total) by mouth 2 (two) times daily. For headache prevention   Vitamin D , Cholecalciferol, 400 units TABS, Take 400 Units by mouth daily.    vitamin E 180 MG (400 UNITS) capsule, Take 400 Units by mouth daily.    Reviewed prior external information including notes and imaging from  primary care provider As well as notes that were available from care everywhere and other healthcare systems.  Past medical history, social, surgical and family history all reviewed in electronic medical record.  No pertanent information unless stated regarding to the chief complaint.   Review of Systems:  No headache, visual changes, nausea, vomiting, diarrhea, constipation, dizziness, abdominal pain, skin rash, fevers, chills, night sweats, weight loss, swollen lymph nodes, body aches, joint swelling, chest pain, shortness of breath, mood changes. POSITIVE muscle aches  Objective  Blood pressure 130/70, pulse (!) 114, height 5' 1 (1.549 m), weight 108 lb (49 kg), SpO2 97%.   General: No apparent distress alert and oriented x3 mood and affect normal, dressed appropriately.  HEENT: Pupils equal, extraocular movements intact  Respiratory: Patient's speak in full sentences and does not appear short of breath  Cardiovascular: No lower extremity edema, non tender, no erythema  Left shoulder has voluntary guarding noted.  Tender to palpation even to light palpation.  Difficult to assess strength secondary to patient compensating.  Limited muscular skeletal ultrasound was performed and interpreted by CLAUDENE HUSSAR, M  Limited ultrasound of patient's shoulder shows significant calcific changes noted in the subacromial space that is  consistent with a calcific bursitis.  Difficult to assess the rotator cuff secondary to the amount of calcium in the area.  Increasing in Doppler flow as well as mild neovascularization in the area.   Procedure: Real-time Ultrasound Guided Injection of left subacromial space Device: GE Logiq E  Ultrasound guided injection is preferred based studies that show increased duration, increased effect, greater accuracy, decreased procedural pain, increased response rate with ultrasound guided versus blind injection.  Verbal informed consent obtained.  Time-out conducted.  Noted no overlying erythema, induration, or other signs of local infection.  Skin prepped in a sterile fashion.  Local anesthesia: Topical Ethyl chloride.  With sterile technique and under real time ultrasound guidance:  Joint visualized.  21g 2 inch needle inserted lateral approach. Pictures taken for needle placement. Patient did have injection of 2 cc of 0.5% Marcaine, and 1cc of Kenalog 40 mg/dL. Completed without difficulty  Pain immediately resolved suggesting accurate placement of the medication.  Advised to call if fevers/chills, erythema, induration, drainage, or persistent bleeding.  Images permanently stored  Impression: Technically successful ultrasound guided injection.  Procedure: Real-time Ultrasound Guided Injection of left acromioclavicular joint Device: GE Logiq Q7 Ultrasound guided injection is preferred based studies that show increased duration, increased effect, greater accuracy, decreased procedural pain, increased response rate, and decreased cost with ultrasound guided versus blind injection.  Verbal informed consent obtained.  Time-out conducted.  Noted no overlying erythema, induration, or other signs of local infection.  Skin prepped in a sterile fashion.  Local anesthesia: Topical Ethyl chloride.  With sterile technique and under real time ultrasound guidance: With a 25-gauge half inch needle injected  with 0.5 cc of 0.5% Marcaine and 0.5 cc of Kenalog 40 mg/mL Completed without difficulty  Pain immediately resolved suggesting accurate placement of the medication.  Images saved Advised to call if fevers/chills, erythema, induration, drainage, or persistent bleeding.  Impression: Technically successful ultrasound guided injection.   Impression and Recommendations:     The above documentation has been reviewed and is accurate and complete Harriett Azar M Aubryanna Nesheim, DO

## 2024-10-03 NOTE — Telephone Encounter (Signed)
 She has history of partial rectal prolapse, please advise patient to continue with stool regimen to have soft stool.  Use Anusol  cream small pea-sized amount per rectum twice daily as needed, avoid bear down.  Avoid using any moisturizers or Vaseline as it can cause retention of moisture and exacerbation of symptoms, can use diaper rash cream or barrier cream if needed

## 2024-10-03 NOTE — Telephone Encounter (Signed)
Patient advised and agrees to this plan of care.

## 2024-10-03 NOTE — Telephone Encounter (Signed)
 Patient reporting rectal discomfort and bright red blood with her stools. She has been sore down there for several days. Blood started today. Stools are soft. She is finishing up a round of prednisone given in the ER for shoulder pain. Has used Vasoline with some relief.

## 2024-10-03 NOTE — Telephone Encounter (Signed)
 Patient requesting to speak with a nurse in regards to rectal bleeding. Please advise.

## 2024-10-04 ENCOUNTER — Ambulatory Visit (INDEPENDENT_AMBULATORY_CARE_PROVIDER_SITE_OTHER): Admitting: Family Medicine

## 2024-10-04 ENCOUNTER — Ambulatory Visit: Admitting: Family Medicine

## 2024-10-04 ENCOUNTER — Other Ambulatory Visit: Payer: Self-pay

## 2024-10-04 ENCOUNTER — Encounter: Payer: Self-pay | Admitting: Family Medicine

## 2024-10-04 VITALS — BP 130/70 | HR 114 | Ht 61.0 in | Wt 108.0 lb

## 2024-10-04 DIAGNOSIS — M7532 Calcific tendinitis of left shoulder: Secondary | ICD-10-CM | POA: Diagnosis not present

## 2024-10-04 DIAGNOSIS — M7530 Calcific tendinitis of unspecified shoulder: Secondary | ICD-10-CM | POA: Insufficient documentation

## 2024-10-04 DIAGNOSIS — M19012 Primary osteoarthritis, left shoulder: Secondary | ICD-10-CM | POA: Diagnosis not present

## 2024-10-04 DIAGNOSIS — M25512 Pain in left shoulder: Secondary | ICD-10-CM

## 2024-10-04 NOTE — Assessment & Plan Note (Signed)
 Mostly calcific changes noted, could be potentially a rotator cuff injury but at the same point secondary to patient having important things she would like  more aggressive therapy.  Patient was given injections today.  Had some mild improvement in range of motion immediately.  We will continue to monitor.  Worsening pain and no significant improvement will need to consider the possibility of MRI.  Patient is in agreement with the plan.  Follow-up with me again 6 to 12 weeks.

## 2024-10-04 NOTE — Patient Instructions (Signed)
 Injections in shoulder today AGGIE PRIDE See you again in 2 months

## 2024-10-04 NOTE — Assessment & Plan Note (Signed)
 Injection given today secondary to patient having a lot of family coming into town and patient wanting to be able to get a little more activity.  Did have a small effusion noted and likely will do well with this.  Follow-up again in 6 to 8 weeks otherwise.  Does make it difficult with patient having stage III kidney chronic kidney disease other treatment options but did discuss topical medications.

## 2024-10-08 ENCOUNTER — Telehealth: Payer: Self-pay | Admitting: Family Medicine

## 2024-10-08 NOTE — Telephone Encounter (Signed)
 Pt left voicemail afterhours, continues to struggle with L shoulder pain. Wants direction on what she can take for pain.

## 2024-10-08 NOTE — Telephone Encounter (Signed)
 Left VM for pt to call, need to schedule Nurse visit.

## 2024-10-08 NOTE — Telephone Encounter (Signed)
 Patient called again this morning and says her left shoulder is in a lot of pain right now. She states she had death in her family and she is leaving around 3:30 today. She is asking if Dr. Claudene recommends a medication or something or does he want to see her first. Please advise.

## 2024-10-09 ENCOUNTER — Ambulatory Visit: Admitting: Neurology

## 2024-10-10 ENCOUNTER — Ambulatory Visit: Admitting: Nurse Practitioner

## 2024-10-10 ENCOUNTER — Encounter: Payer: Self-pay | Admitting: Nurse Practitioner

## 2024-10-10 VITALS — BP 110/62 | HR 91 | Temp 98.1°F | Resp 16

## 2024-10-10 DIAGNOSIS — B9689 Other specified bacterial agents as the cause of diseases classified elsewhere: Secondary | ICD-10-CM

## 2024-10-10 DIAGNOSIS — N3000 Acute cystitis without hematuria: Secondary | ICD-10-CM | POA: Diagnosis not present

## 2024-10-10 DIAGNOSIS — R350 Frequency of micturition: Secondary | ICD-10-CM

## 2024-10-10 DIAGNOSIS — N9089 Other specified noninflammatory disorders of vulva and perineum: Secondary | ICD-10-CM | POA: Diagnosis not present

## 2024-10-10 DIAGNOSIS — N760 Acute vaginitis: Secondary | ICD-10-CM | POA: Diagnosis not present

## 2024-10-10 DIAGNOSIS — A599 Trichomoniasis, unspecified: Secondary | ICD-10-CM

## 2024-10-10 LAB — WET PREP FOR TRICH, YEAST, CLUE

## 2024-10-10 MED ORDER — METRONIDAZOLE 500 MG PO TABS: 500.0000 mg | ORAL_TABLET | Freq: Two times a day (BID) | ORAL | 0 refills | Status: DC

## 2024-10-10 MED ORDER — FLUCONAZOLE 150 MG PO TABS: 150.0000 mg | ORAL_TABLET | ORAL | 0 refills | Status: DC

## 2024-10-10 MED ORDER — NITROFURANTOIN MONOHYD MACRO 100 MG PO CAPS: 100.0000 mg | ORAL_CAPSULE | Freq: Two times a day (BID) | ORAL | 0 refills | Status: DC

## 2024-10-10 NOTE — Progress Notes (Signed)
   Acute Office Visit  Subjective:    Patient ID: Kristen Mahoney, female    DOB: October 12, 1946, 78 y.o.   MRN: [id number]   HPI 78 y.o. presents today for urinary frequency and vulvar burning for a few days. Tried OTC monistat with little improvement. Sexually active with husband for the first time in years recently. H/O infidelity.   No LMP recorded. Patient has had a hysterectomy.    Review of Systems  Constitutional:  Negative for chills and fever.  Genitourinary:  Positive for frequency, urgency and vaginal pain (Vulvar irritation). Negative for difficulty urinating, dysuria, flank pain, genital sores, hematuria and vaginal discharge.       Objective:    Physical Exam Constitutional:      Appearance: Normal appearance.  Abdominal:     Tenderness: There is no right CVA tenderness or left CVA tenderness.  Genitourinary:    Vagina: Vaginal discharge and erythema present.     Comments: Generalized redness    BP 110/62   Pulse 91   Temp 98.1 F (36.7 C) (Oral)   Resp 16   SpO2 99%  Wt Readings from Last 3 Encounters:  10/04/24 108 lb (49 kg)  05/16/24 104 lb (47.2 kg)  05/04/24 104 lb (47.2 kg)        Zada Louder, CMA present as Biomedical engineer.   Wet prep + trich, + clue cells  UA: 1+ leukocytes, neg nitrites, 1+ protein, neg blood, dark yellow/cloudy. Microscopic: wbc 20-40, rbc 3-10, moderate bacteria  Assessment & Plan:   Problem List Items Addressed This Visit   None Visit Diagnoses       Acute cystitis without hematuria    -  Primary   Relevant Medications   nitrofurantoin , macrocrystal-monohydrate, (MACROBID ) 100 MG capsule     Urinary frequency       Relevant Orders   Urinalysis,Complete w/RFL Culture     Vulvar irritation       Relevant Medications   fluconazole  (DIFLUCAN ) 150 MG tablet   Other Relevant Orders   WET PREP FOR TRICH, YEAST, CLUE     Trichomonas infection       Relevant Medications      metroNIDAZOLE  (FLAGYL ) 500 MG tablet         Bacterial vaginosis       Relevant Medications      metroNIDAZOLE  (FLAGYL ) 500 MG tablet         Plan: Wet prep positive for trichomonas and BV - Flagyl  500 mg BID x 7 days. Husband should be treated, no intercourse for 7 days after both have completed treatment. Acute cystitis - Macrobid  BID x 7 days, culture pending. Recommend taking antibiotics with food, probiotics. Diflucan  every 3 days x 3 doses for yeast prevention.   Return in about 4 weeks (around 11/07/2024) for TOC.    Kristen DELENA Shutter DNP, 12:25 PM 10/10/2024

## 2024-10-13 LAB — URINE CULTURE
MICRO NUMBER:: 17102505
SPECIMEN QUALITY:: ADEQUATE

## 2024-10-13 LAB — URINALYSIS, COMPLETE W/RFL CULTURE
Bilirubin Urine: NEGATIVE
Glucose, UA: NEGATIVE
Hgb urine dipstick: NEGATIVE
Ketones, ur: NEGATIVE
Nitrites, Initial: NEGATIVE
Specific Gravity, Urine: 1.02 (ref 1.001–1.035)
pH: 5.5 (ref 5.0–8.0)

## 2024-10-13 LAB — CULTURE INDICATED

## 2024-10-17 ENCOUNTER — Other Ambulatory Visit: Payer: Self-pay | Admitting: Nurse Practitioner

## 2024-10-17 ENCOUNTER — Ambulatory Visit: Payer: Self-pay | Admitting: Nurse Practitioner

## 2024-10-17 DIAGNOSIS — N760 Acute vaginitis: Secondary | ICD-10-CM

## 2024-10-17 DIAGNOSIS — N3000 Acute cystitis without hematuria: Secondary | ICD-10-CM

## 2024-10-17 MED ORDER — AMOXICILLIN-POT CLAVULANATE 500-125 MG PO TABS: 1.0000 | ORAL_TABLET | Freq: Two times a day (BID) | ORAL | 0 refills | Status: AC

## 2024-10-17 MED ORDER — FLUCONAZOLE 150 MG PO TABS: 150.0000 mg | ORAL_TABLET | ORAL | 0 refills | Status: DC

## 2024-10-17 NOTE — Telephone Encounter (Signed)
 Spoke with patient. She is aware of instruction and voiced understanding.

## 2024-10-17 NOTE — Telephone Encounter (Signed)
 Patient called for clairification on medication.  Returned call to patient left message for call back.

## 2024-10-26 ENCOUNTER — Encounter: Payer: Self-pay | Admitting: Nurse Practitioner

## 2024-11-01 ENCOUNTER — Telehealth: Payer: Self-pay

## 2024-11-01 NOTE — Telephone Encounter (Signed)
 Patient left a message on the triage line stating that she has finished all of there antibiotics and diflucan . She would like to know if it is safe for her to get the Covid vaccine today or tomorrow. Please advise.

## 2024-11-01 NOTE — Telephone Encounter (Signed)
 Yes, Covid vaccine is OK.

## 2024-11-01 NOTE — Telephone Encounter (Signed)
 Patient aware

## 2024-11-09 ENCOUNTER — Telehealth: Payer: Self-pay | Admitting: Nurse Practitioner

## 2024-11-09 NOTE — Telephone Encounter (Signed)
 Kristen Mahoney wanted patient to return in 4 weeks from her last visit for a test of cure. Two messages were left and letter mailed asking patient to call and schedule appointment; patient has not called back.

## 2024-11-14 ENCOUNTER — Encounter: Payer: Self-pay | Admitting: Family Medicine

## 2024-11-14 ENCOUNTER — Encounter: Admitting: Family Medicine

## 2024-11-14 NOTE — Telephone Encounter (Signed)
 Left message to call [facility] Triage at [phone], option 4.

## 2024-11-14 NOTE — Progress Notes (Deleted)
   LILLETTE Ileana Collet, PhD, LAT, ATC acting as a scribe for Artist Lloyd, MD.  Kristen Mahoney is a 78 y.o. female who presents to [facility] at [facility] today for cont'd HA. Pt suffered a concussion in Feb 2024 when taking out the trash. Pt was last seen by Dr. Lloyd on 09/21/23 and was advised to cont Topamax .  Today, pt reports ***. She notes she is only taking the Topamax  at the onset of HA.   Pertinent review of systems: ***  Relevant historical information: ***   Exam:  There were no vitals taken for this visit. General: Well Developed, well nourished, and in no acute distress.   MSK: ***    Lab and Radiology Results No results found for this or any previous visit (from the past 72 hours). No results found.     Assessment and Plan: 78 y.o. female with ***   PDMP not reviewed this encounter. No orders of the defined types were placed in this encounter.  No orders of the defined types were placed in this encounter.    Discussed warning signs or symptoms. Please see discharge instructions. Patient expresses understanding.   ***

## 2024-11-15 ENCOUNTER — Encounter: Payer: Self-pay | Admitting: Family Medicine

## 2024-11-15 ENCOUNTER — Ambulatory Visit (INDEPENDENT_AMBULATORY_CARE_PROVIDER_SITE_OTHER): Admitting: Family Medicine

## 2024-11-15 VITALS — BP 108/72 | HR 83 | Ht 61.0 in | Wt 103.0 lb

## 2024-11-15 DIAGNOSIS — R413 Other amnesia: Secondary | ICD-10-CM | POA: Diagnosis not present

## 2024-11-15 DIAGNOSIS — R519 Headache, unspecified: Secondary | ICD-10-CM

## 2024-11-15 MED ORDER — TOPIRAMATE ER 50 MG PO CAP24: 50.0000 mg | ORAL_CAPSULE | Freq: Every day | ORAL | 1 refills | Status: AC

## 2024-11-15 NOTE — Progress Notes (Signed)
   LILLETTE Ileana Collet, PhD, LAT, ATC acting as a scribe for Mahoney Lloyd, MD.  Kristen Mahoney is a 78 y.o. female who presents to [facility] at [facility] today for cont'd HA. Pt suffered a concussion in Feb 2024 when taking out the trash. Pt was last seen by Dr. Lloyd on 09/21/23 and was advised to cont Topamax .  She is taking the Topamax  but generally only daily.  She is not frequently taking the evening dose of Topamax .  Often she forgets.  Today, pt reports HA never resolved. Pain is along the posterior aspect of her head. She notes she has cont'd to take the Topamax  and will also take Tylenol .  She also reports getting mixed up about her appointment day and forgetting her shaw the other day while at the store.  Pertinent review of systems: No fever or chills  Relevant historical information: Hypertension   Exam:  BP 108/72   Pulse 83   Ht 5' 1 (1.549 m)   Wt 103 lb (46.7 kg)   SpO2 98%   BMI 19.46 kg/m  General: Well Developed, well nourished, and in no acute distress.   Neuropsych: Alert and oriented normal speech thought process and affect.   Lab and Radiology Results No results found for this or any previous visit (from the past 72 hours). No results found.     Assessment and Plan: 78 y.o. female with chronic migraine headache.  Topamax  is insufficient.  Will try extended release Trokendi .  This may be more convenient for her.  Additionally she is having worsening memory and concentration issues.  Refer to neurology for further evaluation.   PDMP not reviewed this encounter. Orders Placed This Encounter  Procedures   Ambulatory referral to Neurology    Referral Priority:   Routine    Referral Type:   Consultation    Referral Reason:   Specialty Services Required    Requested Specialty:   Neurology    Number of Visits Requested:   1   Meds ordered this encounter  Medications   Topiramate  ER (TROKENDI  XR) 50 MG CP24    Sig: Take 1 capsule (50  mg total) by mouth daily.    Dispense:  90 capsule    Refill:  1     Discussed warning signs or symptoms. Please see discharge instructions. Patient expresses understanding.   The above documentation has been reviewed and is accurate and complete Mahoney Lloyd, M.D.

## 2024-11-15 NOTE — Patient Instructions (Addendum)
 Thank you for coming in today.   Lets try trokendi  one daily for headaches.   You should hear soon from neurology about looking at your memory.

## 2024-11-16 DIAGNOSIS — M170 Bilateral primary osteoarthritis of knee: Secondary | ICD-10-CM | POA: Diagnosis not present

## 2024-12-04 NOTE — Progress Notes (Unsigned)
 [facility] [address] [place] [address] Phone: [phone] Subjective:    I'm seeing this patient by the request  of:  Katina Pfeiffer, PA-C  CC:   YEP:Dlagzrupcz  10/04/2024 Injection given today secondary to patient having a lot of family coming into town and patient wanting to be able to get a little more activity.  Did have a small effusion noted and likely will do well with this.  Follow-up again in 6 to 8 weeks otherwise.  Does make it difficult with patient having stage III kidney chronic kidney disease other treatment options but did discuss topical medications.     Mostly calcific changes noted, could be potentially a rotator cuff injury but at the same point secondary to patient having important things she would like  more aggressive therapy.  Patient was given injections today.  Had some mild improvement in range of motion immediately.  We will continue to monitor.  Worsening pain and no significant improvement will need to consider the possibility of MRI.  Patient is in agreement with the plan.  Follow-up with me again 6 to 12 weeks.      Update 12/05/2024 JASPREET BODNER is a 78 y.o. female coming in with complaint of L shoulder pain. Patient states      Past Medical History:  Diagnosis Date   Allergy    seasonal   Anxiety    Arthritis    knees,all over   Basal cell carcinoma    Benign breast cyst in female    PATIENT HAS HISTORY OF BREAST CYSTS   Cataract    beginning bilateral   Diabetes mellitus    GERD (gastroesophageal reflux disease)    Headache    Herpes progenitalis    Hyperlipidemia    Hypertension    Kidney stone    Kidney stones    OSA on CPAP    Sleep apnea    cpap   Urinary, incontinence, stress female    Vertigo    Past Surgical History:  Procedure Laterality Date   ABDOMINAL HYSTERECTOMY  1986   leiomyomata   Basal cell excised     breast cystectomy     right   CHOLECYSTECTOMY  2003    COLONOSCOPY     CYSTOSCOPY/URETEROSCOPY/HOLMIUM LASER/STENT PLACEMENT Right 01/12/2022   Procedure: CYSTOSCOPY/RETROGRADE/ URETEROSCOPY/HOLMIUM LASER/STENT PLACEMENT;  Surgeon: Elisabeth Valli BIRCH, MD;  Location: [facility];  Service: Urology;  Laterality: Right;   ear cystectomy     ESOPHAGOGASTRODUODENOSCOPY     KNEE ARTHROSCOPY     right   LITHOTRIPSY     RECTOVAGINAL FISTULA CLOSURE     WISDOM TOOTH EXTRACTION     Social History   Socioeconomic History   Marital status: Married    Spouse name: Not on file   Number of children: 2   Years of education: Not on file   Highest education level: Not on file  Occupational History   Occupation: Retired    Associate Professor: RETIRED  Tobacco Use   Smoking status: Former    Types: Cigarettes   Smokeless tobacco: Never  Vaping Use   Vaping status: Never Used  Substance and Sexual Activity   Alcohol use: Not Currently   Drug use: No   Sexual activity: Not Currently    Partners: Male    Birth control/protection: Surgical    Comment: Hyst, Hx of TV+ & CT+  Other Topics Concern   Not on file  Social History Narrative  Denies caffeine use.   Social Drivers of Corporate Investment Banker Strain: Not on file  Food Insecurity: Not on file  Transportation Needs: Not on file  Physical Activity: Not on file  Stress: Not on file  Social Connections: Not on file   Allergies  Allergen Reactions   Ciprofloxacin  Rash   Morphine And Codeine Itching   Prozac [Fluoxetine] Other (See Comments)    sweats, jittery   Zoloft [Sertraline Hcl] Other (See Comments)    sweating, jittery   Nortriptyline  Hcl Other (See Comments)    hallucinations   Family History  Problem Relation Age of Onset   Diabetes Mother    Diabetes Father    Colon cancer Father    Heart disease Father    Hypertension Father    Diabetes Sister    Hypertension Sister    Diabetes Brother    Colon polyps Brother    Prostate cancer Brother    Breast cancer Paternal Aunt         Age 59's   Colon polyps Brother    Heart disease Brother    Stroke Brother    Hyperlipidemia Brother    Colon polyps Sister    Diabetes Sister    Hypertension Sister    Heart disease Sister    Esophageal cancer Neg Hx    Rectal cancer Neg Hx    Stomach cancer Neg Hx     Current Outpatient Medications (Endocrine & Metabolic):    metFORMIN (GLUCOPHAGE-XR) 500 MG 24 hr tablet, Take 500 mg by mouth daily.   Current Facility-Administered Medications (Endocrine & Metabolic):    betamethasone  acetate-betamethasone  sodium phosphate  (CELESTONE ) injection 3 mg  Current Outpatient Medications (Cardiovascular):    atorvastatin (LIPITOR) 20 MG tablet, Take 20 mg by mouth daily.    losartan (COZAAR) 25 MG tablet, Take 25 mg by mouth daily.   metoprolol  succinate (TOPROL  XL) 50 MG 24 hr tablet, Take 1 tablet (50 mg total) by mouth daily. Take with or immediately following a meal.   Current Outpatient Medications (Respiratory):    fluticasone (FLONASE) 50 MCG/ACT nasal spray, Place 1 spray into both nostrils daily as needed for allergies.   loratadine (CLARITIN) 10 MG tablet, Take 10 mg by mouth daily.   Current Outpatient Medications (Analgesics):    acetaminophen  (TYLENOL ) 500 MG tablet, Take 1,000 mg by mouth every 6 (six) hours as needed (for pain.).   aspirin EC 81 MG tablet, Take 81 mg by mouth every evening.    ibuprofen  (ADVIL ) 400 MG tablet, Take 1 tablet (400 mg total) by mouth every 6 (six) hours as needed for moderate pain or mild pain.     Current Outpatient Medications (Other):    ACCU-CHEK GUIDE test strip, USE TO CHECK BLOOD SUGAR TWO TO THREE TIMES A WEEK AS DIRECTED   Blood Glucose Monitoring Suppl (ACCU-CHEK GUIDE ME) w/Device KIT, as directed.   buPROPion (WELLBUTRIN XL) 150 MG 24 hr tablet, Take 150 mg by mouth daily.   diclofenac Sodium (VOLTAREN) 1 % GEL, Apply 2 g topically daily as needed (pain).   fluconazole  (DIFLUCAN ) 150 MG tablet, Take 1 tablet (150 mg total)  by mouth every 3 (three) days.   hydrocortisone  1 % ointment, Apply 1 application topically 3 (three) times daily.   metroNIDAZOLE  (FLAGYL ) 500 MG tablet, Take 1 tablet (500 mg total) by mouth 2 (two) times daily.   Multiple Vitamins-Minerals (ICAPS) CAPS, Take 1 capsule by mouth 2 (two) times daily.    nitrofurantoin ,  macrocrystal-monohydrate, (MACROBID ) 100 MG capsule, Take 1 capsule (100 mg total) by mouth 2 (two) times daily.   Polyethyl Glycol-Propyl Glycol (SYSTANE) 0.4-0.3 % SOLN, Place 1 drop into both eyes 2 (two) times daily.   RESTASIS 0.05 % ophthalmic emulsion, Place 1 drop into both eyes every 12 (twelve) hours.   Topiramate  ER (TROKENDI  XR) 50 MG CP24, Take 1 capsule (50 mg total) by mouth daily.   Vitamin D , Cholecalciferol, 400 units TABS, Take 400 Units by mouth daily.    vitamin E 180 MG (400 UNITS) capsule, Take 400 Units by mouth daily.    Reviewed prior external information including notes and imaging from  primary care provider As well as notes that were available from care everywhere and other healthcare systems.  Past medical history, social, surgical and family history all reviewed in electronic medical record.  No pertanent information unless stated regarding to the chief complaint.   Review of Systems:  No headache, visual changes, nausea, vomiting, diarrhea, constipation, dizziness, abdominal pain, skin rash, fevers, chills, night sweats, weight loss, swollen lymph nodes, body aches, joint swelling, chest pain, shortness of breath, mood changes. POSITIVE muscle aches  Objective  There were no vitals taken for this visit.   General: No apparent distress alert and oriented x3 mood and affect normal, dressed appropriately.  HEENT: Pupils equal, extraocular movements intact  Respiratory: Patient's speak in full sentences and does not appear short of breath  Cardiovascular: No lower extremity edema, non tender, no erythema      Impression and Recommendations:

## 2024-12-04 NOTE — Telephone Encounter (Signed)
 No response from patient. No future appointments scheduled.   10/10/24 UA and culture positive for ecoli and strep agalactiae. Tx with Macrobid  and Augmentin .     Letter printed and to Tiffany to review, to be signed and mailed to address on file.   Routing FYI.   Encounter closed.

## 2024-12-04 NOTE — Telephone Encounter (Signed)
 Previous letter discontinued.   Updated letter printed.

## 2024-12-04 NOTE — Telephone Encounter (Signed)
 TOC is for + trich. Will sign letter once received.

## 2024-12-05 ENCOUNTER — Other Ambulatory Visit: Payer: Self-pay

## 2024-12-05 ENCOUNTER — Ambulatory Visit: Admitting: Family Medicine

## 2024-12-05 VITALS — BP 112/68 | HR 76 | Ht 61.0 in | Wt 105.0 lb

## 2024-12-05 DIAGNOSIS — B9689 Other specified bacterial agents as the cause of diseases classified elsewhere: Secondary | ICD-10-CM | POA: Diagnosis not present

## 2024-12-05 DIAGNOSIS — M7530 Calcific tendinitis of unspecified shoulder: Secondary | ICD-10-CM | POA: Diagnosis not present

## 2024-12-05 DIAGNOSIS — A599 Trichomoniasis, unspecified: Secondary | ICD-10-CM

## 2024-12-05 DIAGNOSIS — G8929 Other chronic pain: Secondary | ICD-10-CM

## 2024-12-05 DIAGNOSIS — A5901 Trichomonal vulvovaginitis: Secondary | ICD-10-CM

## 2024-12-05 DIAGNOSIS — M25512 Pain in left shoulder: Secondary | ICD-10-CM

## 2024-12-05 DIAGNOSIS — N760 Acute vaginitis: Secondary | ICD-10-CM | POA: Diagnosis not present

## 2024-12-05 MED ORDER — METRONIDAZOLE 500 MG PO TABS: 500.0000 mg | ORAL_TABLET | Freq: Two times a day (BID) | ORAL | 0 refills | Status: DC

## 2024-12-05 NOTE — Assessment & Plan Note (Signed)
 AC joint had resolution.  Given injection and tolerated the procedure well, sizes.  Calcific changes were improved with some interstitial potential tearing of the rotator cuff.  We discussed potential advanced imaging which patient declined because she would not want surgical intervention.  Will see how patient responds and follow-up again in 6 to 12 weeks.

## 2024-12-05 NOTE — Assessment & Plan Note (Signed)
 Given metronidazole  has had this infection previously.

## 2024-12-05 NOTE — Patient Instructions (Addendum)
 Injected L shoulder today Take antibiotic See me again in 2-3 months

## 2025-01-03 ENCOUNTER — Ambulatory Visit: Admitting: Gastroenterology

## 2025-01-03 ENCOUNTER — Encounter: Payer: Self-pay | Admitting: Gastroenterology

## 2025-01-03 VITALS — BP 98/56 | HR 92 | Ht 60.0 in | Wt 101.1 lb

## 2025-01-03 DIAGNOSIS — K589 Irritable bowel syndrome without diarrhea: Secondary | ICD-10-CM

## 2025-01-03 DIAGNOSIS — K582 Mixed irritable bowel syndrome: Secondary | ICD-10-CM | POA: Diagnosis not present

## 2025-01-03 DIAGNOSIS — R159 Full incontinence of feces: Secondary | ICD-10-CM

## 2025-01-03 DIAGNOSIS — K5900 Constipation, unspecified: Secondary | ICD-10-CM

## 2025-01-03 DIAGNOSIS — R350 Frequency of micturition: Secondary | ICD-10-CM | POA: Diagnosis not present

## 2025-01-03 NOTE — Patient Instructions (Addendum)
 Use Miralax 1/2 capful to 1 capful daily for constipation. Adjust dose as needed  in 8 ounces of liquid.  Increase water intake.   Take your fiber supplement daily.  Call you PCP or OB/GYN regarding your urinary symptoms.  _______________________________________________________  If your blood pressure at your visit was 140/90 or greater, please contact your primary care physician to follow up on this.  _______________________________________________________  If you are age 25 or older, your body mass index should be between 23-30. Your Body mass index is 19.75 kg/m. If this is out of the aforementioned range listed, please consider follow up with your Primary Care Provider.  If you are age 22 or younger, your body mass index should be between 19-25. Your Body mass index is 19.75 kg/m. If this is out of the aformentioned range listed, please consider follow up with your Primary Care Provider.   ________________________________________________________  The [facility] providers would like to encourage you to use MYCHART to communicate with providers for non-urgent requests or questions.  Due to long hold times on the telephone, sending your provider a message by [facility] may be a faster and more efficient way to get a response.  Please allow 48 business hours for a response.  Please remember that this is for non-urgent requests.  _______________________________________________________  [facility] is using a team-based approach to care.  Your team is made up of your doctor and two to three APPS. Our APPS (Nurse Practitioners and Physician Assistants) work with your physician to ensure care continuity for you. They are fully qualified to address your health concerns and develop a treatment plan. They communicate directly with your gastroenterologist to care for you. Seeing the Advanced Practice Practitioners on your physician's team can help you by facilitating care more promptly,  often allowing for earlier appointments, access to diagnostic testing, procedures, and other specialty referrals.

## 2025-01-03 NOTE — Progress Notes (Unsigned)
 "  KINJAL NEITZKE [id number] December 10, 1946   Chief Complaint: Constipation  Referring Provider: Katina Pfeiffer, PA-C Primary GI MD: Dr. Shila  HPI: NILZA EAKER is a 79 y.o. female with past medical history of anxiety, skin cancer, diabetes, GERD, HLD, HTN, kidney stones, OSA on CPAP, hysterectomy, cholecystectomy, rectovaginal fistula closure who presents today for a complaint of constipation.    Last seen in office 05/04/2024 by Delon Failing, PA for follow-up of IBS symptoms and weight loss as well as persistent diarrhea.  No explanation for weight loss on prior CT or colonoscopy.  Noted that IBS mixed type with intermittent diarrhea was normal for her.  Plan was to discuss further with Dr. Nandigam to decide if patient may need EGD and possibly another colonoscopy, and in the meantime continue to increase caloric intake with addition of protein shakes.  Dr. Shila advised EGD for further evaluation.  Patient underwent EGD 05/16/2024 with finding of LA grade B reflux esophagitis without bleeding, 3 cm hiatal hernia, gastritis.  Advised to start Protonix  40 mg p.o. twice daily.  Omeprazole  discontinued.  Pathology came back showing reactive epithelial changes in the gastric antrum as well as gastric intestinal metaplasia.  No dysplasia or H. pylori present.  Patient called 10/03/2024 reporting rectal discomfort and BRBPR.  Noted to have history of partial rectal prolapse, advised to continue with stool regimen to have soft stools, use Anusol  cream per rectum twice daily as needed.   2-3 months of constipation  Urinary sx - seen and treated for UTI in October by GYN      Discussed the use of AI scribe software for clinical note transcription with the patient, who gave verbal consent to proceed.  History of Present Illness ARRYN TERRONES is a 79 year old female with irritable bowel syndrome who presents for evaluation of constipation and alternating bowel habits.  Altered bowel  habits and constipation - Several months of constipation with daily small caliber bowel movements, described as little pebbles or skinny pieces of bowel. - Straining during defecation. - Soreness in the lower abdomen and back associated with constipation. - Abdominal massage provides partial relief. - No current use of iron supplements. - Previously used Miralax and stool softeners; some remain at home. - No recent changes in regular medications aside from a completed course of antibiotics for gynecologic issues. - Bowel habits alternate between constipation and diarrhea. - Imodium used as needed, typically two tablets before events, not taken daily. - Periods of average stool consistency interspersed with intermittent diarrhea. - Colonoscopy performed a couple of years ago revealed a single small polyp; otherwise, normal findings.  Urinary symptoms - Increased urinary frequency. - Malodorous urine. - Treated for urinary tract infection in October. - Daily urinary leakage requiring use of a pad. - Concern regarding odor from urinary leakage. - No fever.  Fecal incontinence - Mild stool leakage. - Previously referred to pelvic floor physical therapy without relief.  Nephrolithiasis and renal symptoms - History of nephrolithiasis with prior surgery. - CT scan in August showed a non-obstructing kidney stone. - Current soreness in the lower abdomen and back. - Attempting to increase fluid intake; prefers bottled water over well water.       Previous GI Procedures/Imaging   CT A/P 08/10/2024 IMPRESSION: 1. No acute findings in the abdomen or pelvis. Specifically, no CT evidence for acute pancreatitis. 2. 2 mm nonobstructing right renal stone. 3. Bilobed right adnexal cystic lesion measures 2.9 x 1.8 cm compared to 3.0  x 1.9 cm on the 08/12/2023 exam. This was 2.8 x 1.9 cm on a study from 02/08/2020. Long-term stability suggests benign etiology. 4. 3 mm right lower lobe  pulmonary nodule. No follow-up needed if patient is low-risk.This recommendation follows the consensus statement: Guidelines for Management of Incidental Pulmonary Nodules Detected on CT Images: From the Fleischner Society 2017; Radiology 2017; 284:228-243. 5.  Aortic Atherosclerosis (ICD10-I70.0).  EGD 05/16/2024 - LA Grade B reflux esophagitis with no bleeding. Biopsied.  - 3 cm hiatal hernia.  - Gastritis. Biopsied.  - Normal examined duodenum.  1. Surgical [P], gastric :       GASTRIC ANTRAL / OXYNTIC MUCOSA WITH MARKED REACTIVE EPITHELIAL CHANGES AND       INTESTINAL METAPLASIA.       NO H. PYLORI IDENTIFIED ON H&E STAIN.       NEGATIVE FOR DYSPLASIA.        2. Surgical [P], GE junction :       SQUAMOCOLUMNAR MUCOSA WITH REACTIVE EPITHELIAL CHANGES.       NEGATIVE FOR INTESTINAL METAPLASIA OR DYSPLASIA.   Colonoscopy 07/06/2022 - Decreased sphincter tone found on digital rectal exam.  - One 2 mm polyp in the cecum, removed with a cold snare. Resected and retrieved.  - Severe diverticulosis in the sigmoid colon. There was narrowing of the colon in association with the diverticular opening. There was evidence of diverticular spasm. There was no evidence of diverticular bleeding. - Recall 5 years (health pending) Path: Surgical [P], colon, cecum, polyp (1) TUBULAR ADENOMA. NEGATIVE FOR HIGH-GRADE DYSPLASIA.  Past Medical History:  Diagnosis Date   Allergy    seasonal   Anxiety    Arthritis    knees,all over   Basal cell carcinoma    Benign breast cyst in female    PATIENT HAS HISTORY OF BREAST CYSTS   Cataract    beginning bilateral   Diabetes mellitus    GERD (gastroesophageal reflux disease)    Headache    Herpes progenitalis    Hyperlipidemia    Hypertension    Kidney stone    Kidney stones    OSA on CPAP    Sleep apnea    cpap   Urinary, incontinence, stress female    Vertigo     Past Surgical History:  Procedure Laterality Date   ABDOMINAL HYSTERECTOMY   1986   leiomyomata   Basal cell excised     breast cystectomy     right   CHOLECYSTECTOMY  2003   COLONOSCOPY     CYSTOSCOPY/URETEROSCOPY/HOLMIUM LASER/STENT PLACEMENT Right 01/12/2022   Procedure: CYSTOSCOPY/RETROGRADE/ URETEROSCOPY/HOLMIUM LASER/STENT PLACEMENT;  Surgeon: Elisabeth Valli BIRCH, MD;  Location: [facility];  Service: Urology;  Laterality: Right;   ear cystectomy     ESOPHAGOGASTRODUODENOSCOPY     KNEE ARTHROSCOPY     right   LITHOTRIPSY     RECTOVAGINAL FISTULA CLOSURE     WISDOM TOOTH EXTRACTION      Current Outpatient Medications  Medication Sig Dispense Refill   ACCU-CHEK GUIDE test strip USE TO CHECK BLOOD SUGAR TWO TO THREE TIMES A WEEK AS DIRECTED     acetaminophen  (TYLENOL ) 500 MG tablet Take 1,000 mg by mouth every 6 (six) hours as needed (for pain.).     aspirin EC 81 MG tablet Take 81 mg by mouth every evening.      atorvastatin (LIPITOR) 20 MG tablet Take 20 mg by mouth daily.      Blood Glucose Monitoring Suppl (ACCU-CHEK GUIDE ME) w/Device  KIT as directed.     buPROPion (WELLBUTRIN XL) 150 MG 24 hr tablet Take 150 mg by mouth daily.     diclofenac Sodium (VOLTAREN) 1 % GEL Apply 2 g topically daily as needed (pain).     fluticasone (FLONASE) 50 MCG/ACT nasal spray Place 1 spray into both nostrils daily as needed for allergies.     hydrocortisone  1 % ointment Apply 1 application topically 3 (three) times daily. 30 g 0   ibuprofen  (ADVIL ) 400 MG tablet Take 1 tablet (400 mg total) by mouth every 6 (six) hours as needed for moderate pain or mild pain. 20 tablet 0   loratadine (CLARITIN) 10 MG tablet Take 10 mg by mouth daily.     losartan (COZAAR) 25 MG tablet Take 25 mg by mouth daily.     metFORMIN (GLUCOPHAGE-XR) 500 MG 24 hr tablet Take 500 mg by mouth daily.      metoprolol  succinate (TOPROL  XL) 50 MG 24 hr tablet Take 1 tablet (50 mg total) by mouth daily. Take with or immediately following a meal. 30 tablet 0   Multiple Vitamins-Minerals (ICAPS) CAPS Take 1  capsule by mouth 2 (two) times daily.      Polyethyl Glycol-Propyl Glycol (SYSTANE) 0.4-0.3 % SOLN Place 1 drop into both eyes 2 (two) times daily.     RESTASIS 0.05 % ophthalmic emulsion Place 1 drop into both eyes every 12 (twelve) hours.     Topiramate  ER (TROKENDI  XR) 50 MG CP24 Take 1 capsule (50 mg total) by mouth daily. 90 capsule 1   Vitamin D , Cholecalciferol, 400 units TABS Take 400 Units by mouth daily.      vitamin E 180 MG (400 UNITS) capsule Take 400 Units by mouth daily.     Current Facility-Administered Medications  Medication Dose Route Frequency Provider Last Rate Last Admin   betamethasone  acetate-betamethasone  sodium phosphate  (CELESTONE ) injection 3 mg  3 mg Intra-articular Once Evans, Brent M, DPM        Allergies as of 01/03/2025 - Review Complete 01/03/2025  Allergen Reaction Noted   Ciprofloxacin  Rash 09/04/2009   Morphine and codeine Itching 05/07/2011   Prozac [fluoxetine] Other (See Comments) 02/04/2021   Zoloft [sertraline hcl] Other (See Comments) 02/04/2021   Nortriptyline  hcl Other (See Comments) 04/05/2023    Family History  Problem Relation Age of Onset   Diabetes Mother    Diabetes Father    Colon cancer Father    Heart disease Father    Hypertension Father    Diabetes Sister    Hypertension Sister    Diabetes Brother    Colon polyps Brother    Prostate cancer Brother    Breast cancer Paternal Aunt        Age 6's   Colon polyps Brother    Heart disease Brother    Stroke Brother    Hyperlipidemia Brother    Colon polyps Sister    Diabetes Sister    Hypertension Sister    Heart disease Sister    Esophageal cancer Neg Hx    Rectal cancer Neg Hx    Stomach cancer Neg Hx     Social History[1]   Review of Systems:    Constitutional: No weight loss, fever, chills, weakness or fatigue Eyes: No change in vision Ears, Nose, Throat:  No change in hearing or congestion Skin: No rash or itching Cardiovascular: No chest pain, chest pressure  or palpitations   Respiratory: No SOB or cough Gastrointestinal: See HPI and otherwise negative  Genitourinary: No dysuria or change in urinary frequency Neurological: No headache, dizziness or syncope Musculoskeletal: No new muscle or joint pain Hematologic: No bleeding or bruising    Physical Exam:  Vital signs: BP (!) 98/56 (BP Location: Left Arm, Patient Position: Sitting, Cuff Size: Normal)   Pulse 92   Ht 5' (1.524 m) Comment: height measured without shoes  Wt 101 lb 2 oz (45.9 kg)   BMI 19.75 kg/m   Wt Readings from Last 3 Encounters:  01/03/25 101 lb 2 oz (45.9 kg)  12/05/24 105 lb (47.6 kg)  11/15/24 103 lb (46.7 kg)     Constitutional: NAD, Well developed, Well nourished, alert and cooperative Head:  Normocephalic and atraumatic.  Eyes: No scleral icterus. Conjunctiva pink. Mouth: No oral lesions. Respiratory: Respirations even and unlabored. Lungs clear to auscultation bilaterally.  No wheezes, crackles, or rhonchi.  Cardiovascular:  Regular rate and rhythm. No murmurs. No peripheral edema. Gastrointestinal:  Soft, nondistended, nontender. No rebound or guarding. Normal bowel sounds. No appreciable masses or hepatomegaly. Rectal:  Not performed.  Neurologic:  Alert and oriented x4;  grossly normal neurologically.  Skin:   Dry and intact without significant lesions or rashes. Psychiatric: Oriented to person, place and time. Demonstrates good judgement and reason without abnormal affect or behaviors.      Assessment/Plan:   Assessment & Plan Irritable bowel syndrome with alternating constipation and diarrhea Chronic IBS with constipation predominance, no acute obstruction or alarm features. - Resume daily fiber supplementation. - Increase dietary fiber and water intake. - Use Miralax as needed for constipation, adjust dose if diarrhea develops. - Continue Imodium as needed for diarrhea, monitor for constipation. - Order abdominal x-ray to assess stool burden  and nephrolithiasis.  Fecal incontinence Mild fecal incontinence likely due to weak pelvic floor and IBS. Prior pelvic floor therapy ineffective. - Recommend daily fiber supplementation to bulk stool and reduce incontinence episodes.  History of nephrolithiasis Non-obstructing nephrolithiasis with mild discomfort, possibly related to bladder, constipation, or nephrolithiasis. - Order abdominal x-ray to evaluate nephrolithiasis and stool burden. - Advise increased water intake to reduce nephrolithiasis risk. - Recommend follow-up with gynecology or primary care for urinary symptoms. - Instruct to seek urgent care if fever or worsening pain develops.    PCP/GYN or urgent care for eval/treat of urinary symptoms Miralax 1/2 - 1 cap daily, adjust as needed Increase water intake, take fiber supp daily Follow up 6 weeks Consider repeat colonoscopy?   Camie Furbish, PA-C [facility] 01/03/2025, 1:48 PM  Patient Care Team: Katina Pfeiffer, PA-C as PCP - General (Family Medicine) Prentiss Annabella LABOR, NP as Nurse Practitioner (Gynecology)       [1]  Social History Tobacco Use   Smoking status: Former    Types: Cigarettes   Smokeless tobacco: Never  Vaping Use   Vaping status: Never Used  Substance Use Topics   Alcohol use: Yes    Comment: socially   Drug use: No   "

## 2025-01-06 ENCOUNTER — Encounter: Payer: Self-pay | Admitting: Gastroenterology

## 2025-01-07 ENCOUNTER — Ambulatory Visit: Admitting: Nurse Practitioner

## 2025-01-07 ENCOUNTER — Encounter: Payer: Self-pay | Admitting: Nurse Practitioner

## 2025-01-07 VITALS — BP 114/70 | HR 98 | Temp 97.6°F

## 2025-01-07 DIAGNOSIS — R350 Frequency of micturition: Secondary | ICD-10-CM

## 2025-01-07 DIAGNOSIS — B9689 Other specified bacterial agents as the cause of diseases classified elsewhere: Secondary | ICD-10-CM | POA: Diagnosis not present

## 2025-01-07 DIAGNOSIS — N8110 Cystocele, unspecified: Secondary | ICD-10-CM | POA: Diagnosis not present

## 2025-01-07 DIAGNOSIS — N760 Acute vaginitis: Secondary | ICD-10-CM

## 2025-01-07 DIAGNOSIS — N9089 Other specified noninflammatory disorders of vulva and perineum: Secondary | ICD-10-CM

## 2025-01-07 LAB — WET PREP FOR TRICH, YEAST, CLUE

## 2025-01-07 MED ORDER — METRONIDAZOLE 0.75 % VA GEL: 1.0000 | Freq: Every day | VAGINAL | 0 refills | Status: AC

## 2025-01-07 NOTE — Progress Notes (Signed)
" ° °  Acute Office Visit  Subjective:    Patient ID: Kristen Mahoney, female    DOB: November 08, 1946, 79 y.o.   MRN: [id number]   HPI 79 y.o. presents today for urinary frequency and odor x 1-2 weeks. Always feels like her underwear is wet. Denies vaginal symptoms other than possible odor. Has not been sexually active since + trich months ago. GI manages constipation, fecal incontinence. Saw GI 1/9. Had UTI in October.   No LMP recorded. Patient has had a hysterectomy.    Review of Systems  Constitutional: Negative.  Negative for chills and fever.  Genitourinary:  Positive for frequency. Negative for difficulty urinating, dysuria, flank pain, hematuria, pelvic pain, urgency, vaginal discharge and vaginal pain.       Odor       Objective:    Physical Exam Exam conducted with a chaperone present.  Constitutional:      Appearance: Normal appearance.  Genitourinary:    General: Normal vulva.     Vagina: Normal.     Comments: Grade 1 cystocele    BP 114/70   Pulse 98   Temp 97.6 F (36.4 C) (Oral)   SpO2 98%  Wt Readings from Last 3 Encounters:  01/03/25 101 lb 2 oz (45.9 kg)  12/05/24 105 lb (47.6 kg)  11/15/24 103 lb (46.7 kg)        Zada Louder, CMA present as biomedical engineer.   UA negative  Wet prep + clue cells (+ odor)  Assessment & Plan:   Problem List Items Addressed This Visit   None Visit Diagnoses       Bacterial vaginosis    -  Primary   Relevant Medications   metroNIDAZOLE  (METROGEL ) 0.75 % vaginal gel     Urinary frequency       Relevant Orders   Urinalysis,Complete w/RFL Culture     Vulvar odor       Relevant Orders   WET PREP FOR TRICH, YEAST, CLUE     Baden-Walker grade 1 cystocele          Plan: Metrogel  0.75% nightly x 5 nights. UA unremarkable, culture pending. Wetness could be from vaginal infection but if this continues after treatment PFPT recommended.    Return if symptoms worsen or fail to improve.    Annabella DELENA Shutter DNP, 2:35 PM  01/07/2025 "

## 2025-01-08 LAB — URINALYSIS, COMPLETE W/RFL CULTURE
Bacteria, UA: NONE SEEN /HPF
Bilirubin Urine: NEGATIVE
Glucose, UA: NEGATIVE
Hgb urine dipstick: NEGATIVE
Ketones, ur: NEGATIVE
Leukocyte Esterase: NEGATIVE
Nitrites, Initial: NEGATIVE
RBC / HPF: NONE SEEN /HPF (ref 0–2)
Specific Gravity, Urine: 1.029 (ref 1.001–1.035)
pH: 5 (ref 5.0–8.0)

## 2025-01-08 LAB — URINE CULTURE
MICRO NUMBER:: 17456192
Result:: NO GROWTH
SPECIMEN QUALITY:: ADEQUATE

## 2025-01-09 ENCOUNTER — Ambulatory Visit: Payer: Self-pay | Admitting: Nurse Practitioner

## 2025-02-25 ENCOUNTER — Ambulatory Visit: Admitting: Gastroenterology

## 2025-03-05 ENCOUNTER — Ambulatory Visit: Admitting: Family Medicine
